# Patient Record
Sex: Male | Born: 1945
Health system: Southern US, Community
[De-identification: ages and names within clinical notes are randomized; demographics above are authoritative.]

## PROBLEM LIST (undated history)

## (undated) DIAGNOSIS — Z86010 Personal history of colonic polyps: Secondary | ICD-10-CM

## (undated) DIAGNOSIS — G4730 Sleep apnea, unspecified: Secondary | ICD-10-CM

## (undated) DIAGNOSIS — Z8042 Family history of malignant neoplasm of prostate: Secondary | ICD-10-CM

## (undated) DIAGNOSIS — G4733 Obstructive sleep apnea (adult) (pediatric): Secondary | ICD-10-CM

## (undated) DIAGNOSIS — Z973 Presence of spectacles and contact lenses: Secondary | ICD-10-CM

## (undated) DIAGNOSIS — D649 Anemia, unspecified: Secondary | ICD-10-CM

## (undated) DIAGNOSIS — Z8719 Personal history of other diseases of the digestive system: Secondary | ICD-10-CM

## (undated) DIAGNOSIS — E785 Hyperlipidemia, unspecified: Secondary | ICD-10-CM

## (undated) DIAGNOSIS — N529 Male erectile dysfunction, unspecified: Secondary | ICD-10-CM

## (undated) DIAGNOSIS — Z862 Personal history of diseases of the blood and blood-forming organs and certain disorders involving the immune mechanism: Secondary | ICD-10-CM

## (undated) DIAGNOSIS — M1990 Unspecified osteoarthritis, unspecified site: Secondary | ICD-10-CM

## (undated) DIAGNOSIS — E291 Testicular hypofunction: Secondary | ICD-10-CM

## (undated) DIAGNOSIS — T7840XA Allergy, unspecified, initial encounter: Secondary | ICD-10-CM

## (undated) DIAGNOSIS — Z8709 Personal history of other diseases of the respiratory system: Secondary | ICD-10-CM

## (undated) DIAGNOSIS — I10 Essential (primary) hypertension: Secondary | ICD-10-CM

## (undated) DIAGNOSIS — Z809 Family history of malignant neoplasm, unspecified: Secondary | ICD-10-CM

## (undated) DIAGNOSIS — C61 Malignant neoplasm of prostate: Secondary | ICD-10-CM

## (undated) DIAGNOSIS — N401 Enlarged prostate with lower urinary tract symptoms: Secondary | ICD-10-CM

## (undated) DIAGNOSIS — Z1379 Encounter for other screening for genetic and chromosomal anomalies: Principal | ICD-10-CM

## (undated) HISTORY — DX: Allergy, unspecified, initial encounter: T78.40XA

## (undated) HISTORY — DX: Family history of malignant neoplasm of prostate: Z80.42

## (undated) HISTORY — DX: Hyperlipidemia, unspecified: E78.5

## (undated) HISTORY — DX: Encounter for other screening for genetic and chromosomal anomalies: Z13.79

## (undated) HISTORY — DX: Male erectile dysfunction, unspecified: N52.9

## (undated) HISTORY — DX: Essential (primary) hypertension: I10

## (undated) HISTORY — PX: PROSTATE BIOPSY: SHX241

## (undated) HISTORY — PX: COLONOSCOPY W/ POLYPECTOMY: SHX1380

## (undated) HISTORY — DX: Family history of malignant neoplasm, unspecified: Z80.9

## (undated) HISTORY — DX: Anemia, unspecified: D64.9

## (undated) HISTORY — PX: TONSILLECTOMY: SUR1361

## (undated) HISTORY — DX: Sleep apnea, unspecified: G47.30

---

## 1998-06-11 ENCOUNTER — Other Ambulatory Visit: Admission: RE | Admit: 1998-06-11 | Discharge: 1998-06-11 | Payer: Self-pay | Admitting: *Deleted

## 1999-07-29 ENCOUNTER — Emergency Department (HOSPITAL_COMMUNITY): Admission: EM | Admit: 1999-07-29 | Discharge: 1999-07-29 | Payer: Self-pay | Admitting: Emergency Medicine

## 2002-05-02 ENCOUNTER — Ambulatory Visit (HOSPITAL_COMMUNITY): Admission: RE | Admit: 2002-05-02 | Discharge: 2002-05-02 | Payer: Self-pay | Admitting: *Deleted

## 2002-05-02 ENCOUNTER — Encounter: Payer: Self-pay | Admitting: *Deleted

## 2005-11-27 DIAGNOSIS — Z860101 Personal history of adenomatous and serrated colon polyps: Secondary | ICD-10-CM

## 2005-11-27 DIAGNOSIS — Z86010 Personal history of colonic polyps: Secondary | ICD-10-CM

## 2005-11-27 HISTORY — DX: Personal history of colonic polyps: Z86.010

## 2005-11-27 HISTORY — DX: Personal history of adenomatous and serrated colon polyps: Z86.0101

## 2006-01-24 ENCOUNTER — Emergency Department (HOSPITAL_COMMUNITY): Admission: EM | Admit: 2006-01-24 | Discharge: 2006-01-24 | Payer: Self-pay | Admitting: Emergency Medicine

## 2006-01-30 ENCOUNTER — Emergency Department (HOSPITAL_COMMUNITY): Admission: EM | Admit: 2006-01-30 | Discharge: 2006-01-30 | Payer: Self-pay | Admitting: Emergency Medicine

## 2006-08-13 ENCOUNTER — Ambulatory Visit: Payer: Self-pay | Admitting: Gastroenterology

## 2006-08-24 ENCOUNTER — Encounter (INDEPENDENT_AMBULATORY_CARE_PROVIDER_SITE_OTHER): Payer: Self-pay | Admitting: *Deleted

## 2006-08-24 ENCOUNTER — Ambulatory Visit: Payer: Self-pay | Admitting: Gastroenterology

## 2006-08-27 DIAGNOSIS — Z8719 Personal history of other diseases of the digestive system: Secondary | ICD-10-CM

## 2006-08-27 HISTORY — DX: Personal history of other diseases of the digestive system: Z87.19

## 2006-09-04 ENCOUNTER — Inpatient Hospital Stay (HOSPITAL_COMMUNITY): Admission: AD | Admit: 2006-09-04 | Discharge: 2006-09-07 | Payer: Self-pay | Admitting: Gastroenterology

## 2006-09-04 ENCOUNTER — Ambulatory Visit: Payer: Self-pay | Admitting: Gastroenterology

## 2006-09-10 ENCOUNTER — Ambulatory Visit: Payer: Self-pay | Admitting: Gastroenterology

## 2007-12-24 ENCOUNTER — Ambulatory Visit: Payer: Self-pay | Admitting: Gastroenterology

## 2007-12-24 LAB — CONVERTED CEMR LAB
Basophils Absolute: 0 10*3/uL (ref 0.0–0.1)
HCT: 44.8 % (ref 39.0–52.0)
Hemoglobin: 14.2 g/dL (ref 13.0–17.0)
Lymphocytes Relative: 27.8 % (ref 12.0–46.0)
MCHC: 31.7 g/dL (ref 30.0–36.0)
MCV: 85.7 fL (ref 78.0–100.0)
Monocytes Absolute: 0.4 10*3/uL (ref 0.2–0.7)
Monocytes Relative: 7.4 % (ref 3.0–11.0)
Neutro Abs: 3.6 10*3/uL (ref 1.4–7.7)
Neutrophils Relative %: 61.7 % (ref 43.0–77.0)
RDW: 14.1 % (ref 11.5–14.6)

## 2007-12-25 ENCOUNTER — Ambulatory Visit: Payer: Self-pay | Admitting: Gastroenterology

## 2008-01-06 ENCOUNTER — Ambulatory Visit: Payer: Self-pay | Admitting: Gastroenterology

## 2008-01-06 DIAGNOSIS — H409 Unspecified glaucoma: Secondary | ICD-10-CM | POA: Insufficient documentation

## 2008-01-06 DIAGNOSIS — D126 Benign neoplasm of colon, unspecified: Secondary | ICD-10-CM | POA: Insufficient documentation

## 2008-01-06 DIAGNOSIS — K648 Other hemorrhoids: Secondary | ICD-10-CM | POA: Insufficient documentation

## 2008-01-06 DIAGNOSIS — IMO0002 Reserved for concepts with insufficient information to code with codable children: Secondary | ICD-10-CM

## 2008-07-28 DIAGNOSIS — Z8709 Personal history of other diseases of the respiratory system: Secondary | ICD-10-CM

## 2008-07-28 HISTORY — DX: Personal history of other diseases of the respiratory system: Z87.09

## 2008-08-09 ENCOUNTER — Ambulatory Visit: Payer: Self-pay | Admitting: Cardiothoracic Surgery

## 2008-08-09 ENCOUNTER — Inpatient Hospital Stay (HOSPITAL_COMMUNITY): Admission: EM | Admit: 2008-08-09 | Discharge: 2008-08-12 | Payer: Self-pay | Admitting: Family Medicine

## 2008-08-27 ENCOUNTER — Ambulatory Visit: Payer: Self-pay | Admitting: Cardiothoracic Surgery

## 2008-08-27 ENCOUNTER — Encounter: Admission: RE | Admit: 2008-08-27 | Discharge: 2008-08-27 | Payer: Self-pay | Admitting: Cardiothoracic Surgery

## 2009-02-02 ENCOUNTER — Emergency Department (HOSPITAL_COMMUNITY): Admission: EM | Admit: 2009-02-02 | Discharge: 2009-02-02 | Payer: Self-pay | Admitting: Emergency Medicine

## 2009-02-10 ENCOUNTER — Emergency Department (HOSPITAL_COMMUNITY): Admission: EM | Admit: 2009-02-10 | Discharge: 2009-02-10 | Payer: Self-pay | Admitting: Emergency Medicine

## 2009-02-10 ENCOUNTER — Telehealth: Payer: Self-pay | Admitting: Gastroenterology

## 2009-02-11 ENCOUNTER — Ambulatory Visit: Payer: Self-pay | Admitting: Gastroenterology

## 2009-02-11 ENCOUNTER — Inpatient Hospital Stay (HOSPITAL_COMMUNITY): Admission: EM | Admit: 2009-02-11 | Discharge: 2009-02-13 | Payer: Self-pay | Admitting: Emergency Medicine

## 2009-02-12 ENCOUNTER — Encounter: Payer: Self-pay | Admitting: Gastroenterology

## 2009-02-15 ENCOUNTER — Encounter: Payer: Self-pay | Admitting: Gastroenterology

## 2009-02-18 ENCOUNTER — Encounter: Payer: Self-pay | Admitting: Nurse Practitioner

## 2009-02-22 ENCOUNTER — Telehealth: Payer: Self-pay | Admitting: Gastroenterology

## 2009-02-25 ENCOUNTER — Ambulatory Visit: Payer: Self-pay | Admitting: Gastroenterology

## 2009-02-25 DIAGNOSIS — K922 Gastrointestinal hemorrhage, unspecified: Secondary | ICD-10-CM | POA: Insufficient documentation

## 2010-11-27 HISTORY — PX: UMBILICAL HERNIA REPAIR: SHX2598

## 2011-03-09 LAB — CBC
HCT: 21.5 % — ABNORMAL LOW (ref 39.0–52.0)
HCT: 36.3 % — ABNORMAL LOW (ref 39.0–52.0)
HCT: 38.8 % — ABNORMAL LOW (ref 39.0–52.0)
Hemoglobin: 12.3 g/dL — ABNORMAL LOW (ref 13.0–17.0)
Hemoglobin: 7.4 g/dL — CL (ref 13.0–17.0)
Hemoglobin: 8.2 g/dL — ABNORMAL LOW (ref 13.0–17.0)
Hemoglobin: 8.4 g/dL — ABNORMAL LOW (ref 13.0–17.0)
MCHC: 33.4 g/dL (ref 30.0–36.0)
MCHC: 33.8 g/dL (ref 30.0–36.0)
MCHC: 34.2 g/dL (ref 30.0–36.0)
MCHC: 34.5 g/dL (ref 30.0–36.0)
MCV: 84 fL (ref 78.0–100.0)
MCV: 84.6 fL (ref 78.0–100.0)
MCV: 85.1 fL (ref 78.0–100.0)
MCV: 86 fL (ref 78.0–100.0)
Platelets: 123 10*3/uL — ABNORMAL LOW (ref 150–400)
Platelets: 133 10*3/uL — ABNORMAL LOW (ref 150–400)
Platelets: 141 10*3/uL — ABNORMAL LOW (ref 150–400)
Platelets: 173 10*3/uL (ref 150–400)
RBC: 2.83 MIL/uL — ABNORMAL LOW (ref 4.22–5.81)
RBC: 3.1 MIL/uL — ABNORMAL LOW (ref 4.22–5.81)
RBC: 3.15 MIL/uL — ABNORMAL LOW (ref 4.22–5.81)
RBC: 4.27 MIL/uL (ref 4.22–5.81)
RDW: 15.2 % (ref 11.5–15.5)
RDW: 15.3 % (ref 11.5–15.5)
RDW: 15.3 % (ref 11.5–15.5)
RDW: 15.7 % — ABNORMAL HIGH (ref 11.5–15.5)
WBC: 6.9 10*3/uL (ref 4.0–10.5)
WBC: 7.2 10*3/uL (ref 4.0–10.5)
WBC: 7.4 10*3/uL (ref 4.0–10.5)
WBC: 8.4 10*3/uL (ref 4.0–10.5)

## 2011-03-09 LAB — DIFFERENTIAL
Basophils Absolute: 0 K/uL (ref 0.0–0.1)
Basophils Relative: 0 % (ref 0–1)
Eosinophils Absolute: 0.1 K/uL (ref 0.0–0.7)
Eosinophils Relative: 2 % (ref 0–5)
Lymphocytes Relative: 21 % (ref 12–46)
Lymphocytes Relative: 28 % (ref 12–46)
Lymphs Abs: 1.6 K/uL (ref 0.7–4.0)
Monocytes Absolute: 0.4 10*3/uL (ref 0.1–1.0)
Monocytes Absolute: 0.4 K/uL (ref 0.1–1.0)
Monocytes Relative: 5 % (ref 3–12)
Monocytes Relative: 7 % (ref 3–12)
Neutro Abs: 3.7 10*3/uL (ref 1.7–7.7)
Neutro Abs: 5.3 K/uL (ref 1.7–7.7)
Neutrophils Relative %: 71 % (ref 43–77)

## 2011-03-09 LAB — TYPE AND SCREEN
ABO/RH(D): A POS
Antibody Screen: NEGATIVE

## 2011-03-09 LAB — COMPREHENSIVE METABOLIC PANEL WITH GFR
ALT: 56 U/L — ABNORMAL HIGH (ref 0–53)
AST: 36 U/L (ref 0–37)
Albumin: 3.7 g/dL (ref 3.5–5.2)
Alkaline Phosphatase: 65 U/L (ref 39–117)
BUN: 17 mg/dL (ref 6–23)
CO2: 26 meq/L (ref 19–32)
Calcium: 9.1 mg/dL (ref 8.4–10.5)
Chloride: 101 meq/L (ref 96–112)
Creatinine, Ser: 1.12 mg/dL (ref 0.4–1.5)
GFR calc Af Amer: >60 mL/min (ref >60)
GFR calc non Af Amer: >60 mL/min (ref >60)
Glucose, Bld: 130 mg/dL — ABNORMAL HIGH (ref 70–99)
Potassium: 3.9 meq/L (ref 3.5–5.1)
Sodium: 133 meq/L — ABNORMAL LOW (ref 135–145)
Total Bilirubin: 0.8 mg/dL (ref 0.3–1.2)
Total Protein: 6.7 g/dL (ref 6.0–8.3)

## 2011-03-09 LAB — HEMOGLOBIN AND HEMATOCRIT, BLOOD: Hemoglobin: 9.8 g/dL — ABNORMAL LOW (ref 13.0–17.0)

## 2011-03-09 LAB — COMPREHENSIVE METABOLIC PANEL
AST: 35 U/L (ref 0–37)
Albumin: 3.8 g/dL (ref 3.5–5.2)
BUN: 17 mg/dL (ref 6–23)
Creatinine, Ser: 1.16 mg/dL (ref 0.4–1.5)
GFR calc Af Amer: >60 mL/min (ref >60)
Potassium: 4.1 mEq/L (ref 3.5–5.1)
Total Protein: 7.1 g/dL (ref 6.0–8.3)

## 2011-03-09 LAB — BASIC METABOLIC PANEL
CO2: 26 mEq/L (ref 19–32)
Chloride: 111 mEq/L (ref 96–112)
Glucose, Bld: 96 mg/dL (ref 70–99)
Potassium: 3.6 mEq/L (ref 3.5–5.1)
Sodium: 142 mEq/L (ref 135–145)

## 2011-03-09 LAB — CARDIAC PANEL(CRET KIN+CKTOT+MB+TROPI)
CK, MB: 1.3 ng/mL (ref 0.3–4.0)
Relative Index: INVALID (ref 0.0–2.5)
Total CK: 77 U/L (ref 7–232)
Troponin I: <0.01 ng/mL (ref 0.00–0.06)

## 2011-03-09 LAB — PROTIME-INR
INR: 1 (ref 0.00–1.49)
INR: 1.1 (ref 0.00–1.49)
Prothrombin Time: 13.3 s (ref 11.6–15.2)
Prothrombin Time: 15 seconds (ref 11.6–15.2)

## 2011-03-09 LAB — APTT
aPTT: 24 s (ref 24–37)
aPTT: 26 seconds (ref 24–37)

## 2011-04-11 NOTE — Discharge Summary (Signed)
[name], [name] NO.:  [id number]   MEDICAL RECORD NO.:  [id number]          PATIENT TYPE:  INP   LOCATION:  2011                         FACILITY:  [facility]   PHYSICIAN:  Sheliah Plane, MD    DATE OF BIRTH:  Apr 23, 1946   DATE OF ADMISSION:  08/09/2008  DATE OF DISCHARGE:                               DISCHARGE SUMMARY   ADDENDUM   This was continuation of the previous dictation that was cut off.  Today's x-ray showed no pneumothorax, atelectasis at the bases.   PHYSICAL EXAMINATION:  CARDIOVASCULAR:  Normal sinus rhythm.  LUNGS:  Clear to auscultation bilaterally.  The patient's chest tube was  removed.  Followup chest x-ray revealed no pneumothorax.  Slight  decrease in lung volumes with elevation of left hemidiaphragm.  Chest x-  ray remained stable.  The patient will be discharged on August 12, 2008.   Laboratory studies done revealed the H and H to be 14.9 and 45.2  respectively, white count of 6100, platelet count of 206,000.   Discharge instructions include the following:  The patient may shower.  He is to cleanse his wound with soap and water and then may let open to  air.  He is not to lift or drive until after being seen by the surgeon.  He is to continue with deep breathing exercises daily and to walk daily.  He is to come to see Dr. Tyrone Sage on August 27, 2008.  Prior to this  office appointment, a chest x-ray will be obtained.  Chest tube suture  will be removed at his office appointment with Dr. Tyrone Sage.   Discharge medications include the following:  ECASA 81 mg p.o. daily,  Lipitor 20 mg p.o. daily, Flomax 0.4 mg p.o. daily,  lisinopril/hydrochlorothiazide 20/12.5 mg p.o. daily, and oxycodone IR 5  mg one to two tablets p.o. q.4-6 h. as needed for pain.      Doree Fudge, [place]      Sheliah Plane, MD  Electronically Signed    DZ/MEDQ  D:  08/11/2008  T:  08/12/2008  Job:  [id number]   cc:   [name], M.D.

## 2011-04-11 NOTE — Assessment & Plan Note (Signed)
[facility]                         GASTROENTEROLOGY OFFICE NOTE   NAME:Jeane, Garrett CAIN                       MRN:          [id number]  DATE:01/06/2008                            DOB:          07-Jun-1946    GI PROBLEM LIST:  1. History of tubular adenomas.  2. Colonoscopy by Dr. Victorino Dike, September 2007 showed a 7 mm      sessile polyp removed by hot biopsy, and this was found to be a      tubular adenoma.  This was complicated by post polypectomy bleeding      that was treated by Dr. Barnet Pall with repeat colonoscopy,      injection and Endoclip placement of the site in his cecum.  3. History of internal hemorrhoids.   INTERVAL HISTORY:  I last saw [name] two weeks ago.  He had had some  minor rectal bleeding, some anal discomfort.  A CBC just prior to his  visit showed that he was not anemic, specifically his hemoglobin was  14.2.  Examination revealed some fullness in his anus consistent with  swollen internal hemorrhoids.  Since then he has been taking Analpram  twice daily, and has noticed no problems with rectal bleeding or anal  discomfort.   CURRENT MEDICATIONS:  Aspirin, Flomax, lisinopril, AndroGel, Lanacane,  coconut oil, Allegra, multivitamin and Analpram twice daily.   PHYSICAL EXAMINATION:  Weight:  224 pounds, which is up 3 pounds since  his last visit.  Blood pressure 122/82, pulse 82.  CONSTITUTIONAL:  Generally well appearing.  ABDOMEN:  Soft, nontender, nondistended.  Normal bowel sounds.   ASSESSMENT/PLAN:  65 year old man with resolved minor anorectal  bleeding.   His bleeding was clinically consistent with hemorrhoidal bleeding.  I  did palpate some soft, swollen internal hemorrhoids on examination two  weeks ago.  His bleeding has resolved.  Given the fact that he has had  two colonoscopies back in the fall of 2007, I see no reason for any  further invasive testing.  He will wean himself off the Analpram  to once  daily for the next 10 days, and then come off completely.  He knows to  give a call if he has any further troubles with bleeding.     Rachael Fee, MD  Electronically Signed    DPJ/MedQ  DD: 01/06/2008  DT: 01/06/2008  Job #: [id number]   cc:   [name], M.D.

## 2011-04-11 NOTE — Consult Note (Signed)
NAMEKEENON, LEITZEL NO.:  [id number]   MEDICAL RECORD NO.:  [id number]         PATIENT TYPE:  CINP   LOCATION:                               FACILITY:  [facility]   PHYSICIAN:  Sheliah Plane, MD    DATE OF BIRTH:  10-13-1946   DATE OF CONSULTATION:  DATE OF DISCHARGE:                                 CONSULTATION   REQUESTING PHYSICIAN:  [facility].   REASON FOR ADMISSION:  Right spontaneous pneumothorax.   HISTORY OF PRESENT ILLNESS:  The patient is a 65 year old male who noted  several days of increasing chest congestion and vague discomfort in his  right chest.  After working in his yard today and then going to a play  at [employer], he mentioned to his wife about the discomfort and went  [facility] where chest x-ray was performed at 6:30.  He  was transferred to the [facility] Emergency Room with diagnosis of spontaneous  pneumothorax.  I was beeped at 9:14 to see the patient.  He is really in  no distress, but chest x-ray reveals at least 60% pneumothorax.   PAST MEDICAL HISTORY:  Significant for hyperlipidemia and hypertension.   PAST SURGICAL HISTORY:  Includes polyp removal by colonoscopy.   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   FAMILY HISTORY:  Father died of a metastatic cancer of unknown origin.  The patient's mother died of a cardiac disease.   CURRENT MEDICATIONS:  1. Lisinopril 20 mg a day.  2. Lisinopril/HCTZ 20/12.5 p.o. daily.  3. Lipitor 20 mg a day.  4. Flomax 0.4 mg a day.   REVIEW OF SYSTEMS:  The patient denies fever, chills, or night sweats.  Denies chest pain other than the last 2 days as noted above.  He has no  cough and no hemoptysis.  No history of tuberculosis.  No change in  bowel habits.  No blood in his stool or urine.  He does have mild  nocturnal dyspnea, treated with Flomax.   Denies claudication.  No  amaurosis or TIAs.   On physical exam, the patient's blood pressure is 158/103, pulse is 77,  respiratory rate 15, and O2 sats 100% on room air.  He has no carotid  bruits.  He is awake, alert, and neurologically intact.  Breath sounds  are markedly decreased over the right chest, clear over the left.  Abdominal exam without palpable tenderness or palpable masses.  He has  no tenderness in his lower extremities.  DP and PT pulses +1  bilaterally.   IMPRESSION:  The patient with right spontaneous pneumothorax, nonsmoker  with no history of asthma.  A right chest tube placement is recommended.  The patient agrees and signed informed consent.   DESCRIPTION OF THE PROCEDURE:  Under light intravenous sedation with O2  sat monitoring and EKG monitoring, the right chest was prepped with  Betadine. Lidocaine 1% was infiltrated locally.  A 28-French chest tube  was placed into  the right chest without difficulty with significant  amount of air return.  Followup chest x-ray is pending.      Sheliah Plane, MD  Electronically Signed     EG/MEDQ  D:  08/09/2008  T:  08/10/2008  Job:  [id number]   cc:   [name], M.D.

## 2011-04-11 NOTE — Assessment & Plan Note (Signed)
[facility]                         GASTROENTEROLOGY OFFICE NOTE   NAME:[name], [name]                       MRN:          [id number]  DATE:12/25/2007                            DOB:          Oct 21, 1946    PRIMARY CARE PHYSICIAN:  [name], M.D.   GI PROBLEM LIST:  1. History of tubular adenomas.  2. Colonoscopy by Dr. Victorino Dike, September 2007 showed a 7 mm      sessile polyp removed by hot biopsy, and this was found to be a      tubular adenoma.  This was complicated by post polypectomy bleeding      that was treated by Dr. Barnet Pall with repeat colonoscopy,      injection and Endoclip placement of the site in his cecum.  3. History of internal hemorrhoids.   INTERVAL HISTORY:  I last saw [name] about a year and a half ago.  He had been doing well, having no problems with constipation or bleeding  until the past month or two.  He noticed some discomfort consistent with  external hemorrhoids.  He began using Preparation H White with good  relief of his itching and his discomfort.  The last week or so he had  intermittent rectal bleeding which has been somewhat consistent with  bloody diarrhea on one occasion; but on two other occasions, it was more  dripping of blood from his anus.   CURRENT MEDICATIONS:  Aspirin, Flomax, lisinopril, AndroGel, __________  , coconut oil.   PHYSICAL EXAMINATION:  VITAL SIGNS:  Weight is 221 pounds which is up 9  pounds since his last visit.  Blood pressure is 130/72, pulse 72.  CONSTITUTIONAL:  Generally well-appearing.  ABDOMEN:  Soft, nontender, nondistended, normal bowel sounds.  RECTAL EXAMINATION:  No obvious external hemorrhoids, although there was  some fullness in his anus consistent with some swollen internal  hemorrhoids.  There is no blood, no masses palpated.   ASSESSMENT/PLAN:  A 65 year old man with likely internal hemorrhoid  bleeding.   I did not mention it above, but he had a CBC  done yesterday showing he  is not anemic.  His hemoglobin is 14.2.  He has not started any new  blood thinners or any platelet agents.  I gave him samples of Analpram  to be inserted anally twice a day for the next two weeks or so.  He  already has an appointment set up with me in 10 days to two weeks time,  and he knows to keep  that appointment if his bleeding continues.  Past that, I would like to  perform likely a flexible sigmoidoscopy to make sure we are not missing  anything more serious.     Rachael Fee, MD  Electronically Signed    DPJ/MedQ  DD: 12/25/2007  DT: 12/25/2007  Job #: [id number]   cc:   [name], M.D.

## 2011-04-11 NOTE — H&P (Signed)
[name], DISCHER NO.:  [id number]   MEDICAL RECORD NO.:  [id number]          PATIENT TYPE:  INP   LOCATION:  2607                         FACILITY:  [facility]   PHYSICIAN:  Michiel Cowboy, MDDATE OF BIRTH:  1946/09/20   DATE OF ADMISSION:  02/11/2009  DATE OF DISCHARGE:                              HISTORY & PHYSICAL   PRIMARY CARE Eh Sesay:  Dr. Azucena Cecil.   CHIEF COMPLAINT:  Bright red blood per rectum.   The patient is a 65 year old gentleman with past medical history  significant for hypertension, hemorrhoids, and history of spontaneous  pneumothorax in 2009.  The patient was at his baseline of health up  until a few days ago when he became more constipated.  Yesterday he  passed fairly large and hard stool, but did not endorse any pain or  tearing sensation with that stool.  Today in the morning around 11:30 he  felt like he had gas, but when he went to the bathroom it was actually  blood.  He cannot really tell quantity of how much blood it was, but  does report some clots in it.  Since then he had about 3-4 or so bloody  bowel movements into the toilet.  He presented initially to the  emergency department initially around 4 p.m., and hemoglobin was 13.2  and he appeared to be stable and was sent home, but came back at 8:30  with worsening sensation of presyncope when he stands up.  He was  orthostatic, and his hemoglobin went down to 12.3 at which point [facility]  hospitalist was called for admission.  The patient started to receive IV  fluids, but still remained somewhat orthostatic.  He denies any  shortness of breath or chest pain, no nausea, no vomiting.  He does  endorse slight cramping in the abdomen, but, otherwise, unremarkable.   SOCIAL HISTORY:  The patient does not smoke, but does chew tobacco.  He  drinks only on the weekends, about a case of beer per entire weekend,  but does not drink during regular workdays.   FAMILY HISTORY:   Noncontributory.   ALLERGIES:  PENICILLIN.   MEDICATIONS:  1. Lisinopril.  He does not know his dosing.  2. __________ mg daily.  3. Lipitor 10 mg daily.  4. Aspirin 81 mg daily.  5. Allegra.  6. Lumigan eye drops.   VITALS:  Temperature 98.6, blood pressure initially 123/78, but when  stands up down to 97/72, pulse 96, respirations 20, saturating 99% on  room air.  The patient appears to be in no acute distress.  HEAD:  Nontraumatic.  Moist mucous membranes.  LUNGS:  Clear to auscultation bilaterally.  HEART:  Regular rate and rhythm.  No murmurs, rubs or gallops.  ABDOMEN:  Soft.  There is a very mild left lower quadrant discomfort,  but, otherwise, unremarkable.  RECTAL:  Per ED, positive for frank bright red blood in rectum.  LOWER EXTREMITIES:  No clubbing, cyanosis or edema.  NEUROLOGICAL:  Appears to be intact.  Strength 5/5 in all 4 extremities.  SKIN:  Clean, dry, and  intact.   LABORATORIES:  White blood cell count 7.5, hemoglobin 12.3.  Sodium 133,  potassium 3.9, creatinine 1.12.  AST 36, ALT slightly elevated at 56,  but the rest of LFTs unremarkable.  Hemoccult positive.  No ABG was  obtained.   EKG:  Sinus rhythm, heart rate 88, no ischemic changes noted with early  repolarization in lead V2 which is same as from EKG of August 08, 2008.   ASSESSMENT AND PLAN:  1. This is a 65 year old gentleman with history of hemorrhoids who now      presents with bright red blood per rectum after some hard stools.      The differential diagnosis could include a hemorrhoidal bleed      versus a fissure, though is less likely given no history of      significant pain with defecation versus diverticular bleed.  Will      admit given orthostasis and patient being symptomatic will watch in      step-down.  I only can recommend gastrointestinal consult in a.m.,      and will call sooner if patient decompensates.  Will give      intravenous fluids, although I doubt upper  source because patient      has not had any hematemesis or epigastric pain, and will,      nonetheless, give Protonix intravenous b.i.d.  Will make patient      n.p.o.  The patient will likely benefit from either flex sig or      full colonoscopy tomorrow.  Will hold blood pressure medications      and hold aspirin.  2. History of hypertension currently somewhat low blood pressures.      Will hold lisinopril.  3. History of hyperlipidemia.  Once patient able to take PO, will      restart Lipitor.  4. Prophylaxis Protonix,  prophylaxis sequential compression devices.  5. Will do serial CBCs.  Right now there is no indication for      transfusion, but if hemoglobin starts to fall will write for a      transfusion.      Michiel Cowboy, MD  Electronically Signed     AVD/MEDQ  D:  02/11/2009  T:  02/11/2009  Job:  [id number]   cc:   [name], M.D.

## 2011-04-11 NOTE — Discharge Summary (Signed)
NAMEBRITNEY, CAPTAIN NO.:  [id number]   MEDICAL RECORD NO.:  [id number]          PATIENT TYPE:  INP   LOCATION:  2011                         FACILITY:  [facility]   PHYSICIAN:  Sheliah Plane, MD    DATE OF BIRTH:  Mar 14, 1946   DATE OF ADMISSION:  08/09/2008  DATE OF DISCHARGE:  08/12/2008                               DISCHARGE SUMMARY   ADMITTING DIAGNOSES:  1. Right spontaneous pneumothorax.  2. History of hypertension.  3. History of hyperlipidemia.   DISCHARGE DIAGNOSES:  1. Right spontaneous pneumothorax.  2. History of hypertension.  3. History of hyperlipidemia.   PROCEDURE:  Placement of a right chest tube on August 09, 2008, by  Dr. Tyrone Sage.   HISTORY OF PRESENTING ILLNESS:  This is a 65 year old African American  male with a prior medical history of hypertension and hyperlipidemia,  who began experiencing increasing chest congestion and vague discomfort  in his right chest over the last several days.  [facility], [facility], where a chest x-ray was performed  around 6:30 in the evening.  He was found to have a right spontaneous  pneumothorax.  He was then transferred to [facility] Room for  further evaluation and treatment.  Chest x-ray done showed a large right-  sided pneumothorax, but is approximately 60% left lung clear and well  aerated.  The patient underwent a right chest tube placement.   BRIEF HOSPITAL COURSE STAY:  The patient had no air leak from his chest  tube.  The following morning, he remained afebrile and vital signs  stable.  O2 sat was 100% on 2 liters nasal cannula.  Chest x-ray done on  this date showed subsegmental atelectasis in the lower lung zone, but no  pneumothorax.  Chest tube was placed on waterseal.  Currently, again the  patient is afebrile and vital signs are stable.  O2 sat is 90% on room  air.  CT has minimal drainage, no air leak.  Chest x-ray done   Dictation  ended at this point.      Doree Fudge, [place]      Sheliah Plane, MD  Electronically Signed   DZ/MEDQ  D:  08/11/2008  T:  08/12/2008  Job:  [id number]

## 2011-04-11 NOTE — Assessment & Plan Note (Signed)
OFFICE VISIT   LEVY, CEDANO  DOB:  07/19/1946                                        August 27, 2008  CHART #:  [id number]   The patient returns to the office today in followup after spontaneous  right pneumothorax and placement of a right chest tube.  Although the  patient has no previous history of smoking, he does use oral tobacco and  has for many years since his chest tube was removed.  The patient has  done well.  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  He has had no recurrent  symptoms.  He has had no shortness of breath.  No hemoptysis.  Overall,  he has felt well.   PHYSICAL EXAMINATION:  VITAL SIGNS:  His blood pressure is 129/84, pulse  is 75, respiratory rate is 18, and O2 sats 98%.  LUNGS:  Clear bilaterally.  The right chest tube sutures were removed.  The chest tube site is healed.  ABDOMEN:  Benign without palpable masses.  Aorta is not palpably  enlarged.   Followup chest x-ray shows clear lung fields bilaterally without  evidence of recurrent pneumothorax.   I have discussed with the patient the slight, but new risk of recurrent  pneumothorax and somebody who has had a previous one were also talked  about his use of oral tobacco and was strongly discouraged to stop.  I  also encouraged him to seek regular dental care.  He notes that he has  not been to the dentist for many years.  He was encouraged to seek  regular dental care for surveillance of possible oral cancers.  The  patient notes that he is now covered with dental insurance with his  wife's policy.  He is to see Dr. Azucena Cecil in followup for his annual  physical in several weeks.   I have not made him a return appointment, but have reviewed with him the  signs and symptoms of pneumothorax and encouraged him to seek prompt  medical attention should he have any recurrent symptoms.   Sheliah Plane, MD  Electronically  Signed   EG/MEDQ  D:  08/27/2008  T:  08/28/2008  Job:  [id number]   cc:   [name], M.D.

## 2011-04-14 NOTE — Discharge Summary (Signed)
NAMEAVRAJ, LINDROTH NO.:  [id number]   MEDICAL RECORD NO.:  [id number]          PATIENT TYPE:   LOCATION:                                 FACILITY:   PHYSICIAN:  Barbette Hair. Arlyce Dice, MD,FACGDATE OF BIRTH:  1945/12/19   DATE OF ADMISSION:  09/04/2006  DATE OF DISCHARGE:  09/07/2006                               DISCHARGE SUMMARY   The sonogram and please see dictated discharge summary for the patient  record number.   REASON FOR ADMISSION:  1. Lower GI, post polypectomy in etiology.  The patient required      colonoscopic intervention to the site of the polypectomy.  2. Tubular adenoma on colonoscopy August 24, 2006, by Dr. Victorino Dike.  3. Hypertension.  4. Hypercholesterolemia.  5. Tonsillectomy.  6. Glaucoma.  7. Allergy to PENICILLIN.  8. Chronic tobacco chewer.   DISCHARGE DIAGNOSES:  1. Lower gastrointestinal bleed, presumed post polypectomy source.      Bleeding resolved without any intervention.  2. Acute blood loss anemia.  Transfused with 2 units packed red blood      cells during this admission.  Hemoglobin nadir of 7.9; it was 11.3      at discharge.  3. Status post colonoscopy on September 05, 2006, by Dr. Melvia Heaps.      A large clot and active bleeding seen at the site of the      polypectomy.  This was treated with epinephrine injection and      application of 2 Endoclips after which hemostasis was obtained.      Also noted at colonoscopy was diffuse diverticulosis.   BRIEF HISTORY:  Mr. Nicholos Johns is a pleasant 65 year old gentleman who had  undergone a screening colonoscopy August 24, 2006, by Dr. Victorino Dike.  A 7-mm sessile polyp was removed by hot biopsy.  The pathology  turned out to be a tubular adenoma.  The patient had been doing well  since then, but about 5 days after the procedure he started seeing some  bloody looking stools.  Initially this happened once a day in the  morning.  However, on  October 9 he had two  bloody bowel movements and  was seen at the gastroenterology office by Dr. Rob Bunting.  He was  not having any associated shortness of breath, chest pain, dizziness,  nausea, or abdominal pain.  He actually felt fine; he was just having  the bloody bowel movements.  Dr. Christella Hartigan admitted him for what was  presumed to be a post polypectomy bleed.   HOSPITAL COURSE:  #1.  LOWER GASTROINTESTINAL  BLEED:  The patient underwent a  colonoscopy.  As described above, the site of the polypectomy was indeed  found to be the source for the bleeding.  This was treated.  Following  the colonoscopy with endoscopic treatment, the patient's bleeding did  not recur.  The patient was discharged on October 12 in stable  condition.   #2.  ACUTE BLOOD LOSS ANEMIA:  The patient's initial hemoglobin on  admission was 13.2.  It dropped to  7.9.  He received 2 units of packed  red blood cells, and the hemoglobin was 11.3 on the day of discharge.   DISCHARGE DIET:  Regular.   CONDITION ON DISCHARGE:  Stable, improved.   FOLLOWUP PLANS:  With Dr. Victorino Dike in 2-3 weeks.   DISCHARGE MEDICATIONS:  1. Feosol one p.o. twice daily.  2. Aspirin 81 mg once daily.  3. Flomax 0.4 mg once daily.  4. Lipitor 10 mg once daily.  5. AndroGel 5 grams p.o. once daily.  6. Lumigan eye drops 1 drop to each eye daily at bedtime.  7. Lisinopril, dose unknown, once daily.  8. Vitamin E once daily.  9. Selenium supplement once daily.      Jennye Moccasin, PA-C      Barbette Hair. Arlyce Dice, MD,FACG  Electronically Signed    SG/MEDQ  D:  10/28/2007  T:  10/28/2007  Job:  [id number]

## 2011-04-14 NOTE — Assessment & Plan Note (Signed)
HEALTHCARE                           GASTROENTEROLOGY OFFICE NOTE   NAME:[name]                       MRN:          [id number]  DATE:09/04/2006                            DOB:          1946-02-14    PRIMARY CARE PHYSICIANS:  1. Primary Gastroenterologist:  Ulyess Mort, M.D.  2. Primary care physician:  [name], M.D.   GI PROBLEM LIST:  History of tubular adenomas.  Colonoscopy August 24, 2006 by Dr. Victorino Dike, 7 mm sessile polyp removed by hot biopsy.  This was  found to be a tubular adenoma on biopsy report.   INTERVAL HISTORY:  Mr. [name] was last seen here at the time of his  colonoscopy August 24, 2006.  He did fine afterwards and had no problems  until about four to five days later when he started noticing some dark  bloody-looking stools.  This would happen just once a day or so but this  morning, he had two frankly bloody bowel movements, one at 6:30 and one at a  little after 7:00  He does feel the urge to move his bowels again this  morning at 9:30 when I am seeing him in the office.  He has had no  lightheadedness, no chest pain, no shortness of breath.  When he stands he  feels fine.   PAST MEDICAL HISTORY:  1. Hypertension.  2. Elevated cholesterol.  3. Tonsillectomy.  4. Glaucoma.   CURRENT MEDICATIONS:  1. Aspirin 81 mg (he began taking this aspirin the day after his      colonoscopy).  2. Flomax.  3. Lisinopril.  4. Selenium.  5. Vitamin E.  6. Lipitor.  7. AndroGel.  8. Lumigan eye drops.   ALLERGIES:  PENICILLIN.   SOCIAL HISTORY:  Chews tobacco, nonsmoker. He does drink alcohol  periodically.   FAMILY HISTORY:  Father with colon cancer.   PHYSICAL EXAMINATION:  VITAL SIGNS:  6 feet, 2 inches, 212 pounds.  Blood  pressure 118/76. Pulse 68.  CONSTITUTIONAL:  Generally well-appearing.  NEUROLOGICAL:  Alert and oriented x3.  HEENT:  Eyes with extraocular movements intact.  Mouth with  oropharynx  moist, no lesions.  NECK:  Supple, no lymphadenopathy.  CARDIOVASCULAR:  Heart has regular rate and rhythm.  LUNGS:  Clear to auscultation bilaterally.  ABDOMEN:  Soft, nontender, nondistended.  Normal bowel sounds.  EXTREMITIES:  No lower extremity edema.  SKIN:  No rashes or lesions on visible extremities.  RECTAL:  Positive for gross red blood in rectal vault.   ASSESSMENT/PLAN:  Patient is a 65 year old man with likely post polypectomy  bleed from cecal polyp removed by hot biopsy.   He does not look at all unstable now, but it does seem like the bleeding has  picked up this morning with two bright red blood bowel movements and he has  the urge to go again.  Arranging for him to be directly admitted to the  hospital, getting STAT CBC, coag's, CMET.  He will have another set later  tonight.  Also begin prepping him with a gallon of GoLYTELY  beginning at  4:00 P.M.  The plan will be that if he continues to bleed throughout the  prep and throughout today then he will have a full colonoscopy by my  partner, Dr. Melvia Heaps tomorrow.  The goal will be to look for the site  of bleeding, probably the post polypectomy site of his cecum.  However, if  he clears throughout the prep course and throughout today there is probably  no reason to proceed with colonoscopy again.  I have discussed the case with  our Physician Assistant, Amy Esterwood.  I have written orders for him to be  directly admitted to the hospital.            ______________________________  Rachael Fee, MD      DPJ/MedQ  DD:  09/04/2006  DT:  09/04/2006  Job #:  [id number]   cc:   Ulyess Mort, MD  [name], M.D.  Iva Boop, MD,FACG

## 2011-08-30 LAB — DIFFERENTIAL
Basophils Relative: 1
Lymphs Abs: 1.8
Monocytes Relative: 8
Neutro Abs: 3.6
Neutrophils Relative %: 58

## 2011-08-30 LAB — POCT I-STAT, CHEM 8
Chloride: 104
Glucose, Bld: 96
HCT: 48
Hemoglobin: 16.3
Potassium: 3.7
Sodium: 136

## 2011-08-30 LAB — CBC
MCHC: 32.8
Platelets: 206
RBC: 5.32
WBC: 6.1

## 2011-12-27 ENCOUNTER — Inpatient Hospital Stay (HOSPITAL_COMMUNITY)
Admission: EM | Admit: 2011-12-27 | Discharge: 2011-12-31 | DRG: 174 | Disposition: A | Discharge status: Home / Self Care | Payer: BC Managed Care – PPO | Attending: Internal Medicine | Admitting: Internal Medicine

## 2011-12-27 ENCOUNTER — Encounter (HOSPITAL_COMMUNITY): Payer: Self-pay | Admitting: Emergency Medicine

## 2011-12-27 DIAGNOSIS — K5731 Diverticulosis of large intestine without perforation or abscess with bleeding: Principal | ICD-10-CM | POA: Diagnosis present

## 2011-12-27 DIAGNOSIS — I959 Hypotension, unspecified: Secondary | ICD-10-CM

## 2011-12-27 DIAGNOSIS — K922 Gastrointestinal hemorrhage, unspecified: Secondary | ICD-10-CM

## 2011-12-27 DIAGNOSIS — Z860100 Personal history of colon polyps, unspecified: Secondary | ICD-10-CM

## 2011-12-27 DIAGNOSIS — Z880 Allergy status to penicillin: Secondary | ICD-10-CM

## 2011-12-27 DIAGNOSIS — I10 Essential (primary) hypertension: Secondary | ICD-10-CM | POA: Diagnosis present

## 2011-12-27 DIAGNOSIS — D62 Acute posthemorrhagic anemia: Secondary | ICD-10-CM | POA: Diagnosis present

## 2011-12-27 DIAGNOSIS — IMO0002 Reserved for concepts with insufficient information to code with codable children: Secondary | ICD-10-CM | POA: Diagnosis present

## 2011-12-27 DIAGNOSIS — N400 Enlarged prostate without lower urinary tract symptoms: Secondary | ICD-10-CM | POA: Diagnosis present

## 2011-12-27 HISTORY — DX: Personal history of colonic polyps: Z86.010

## 2011-12-27 LAB — DIFFERENTIAL
Lymphocytes Relative: 39 % (ref 12–46)
Lymphs Abs: 2.4 10*3/uL (ref 0.7–4.0)
Monocytes Absolute: 0.3 10*3/uL (ref 0.1–1.0)
Monocytes Relative: 5 % (ref 3–12)
Neutro Abs: 3.4 10*3/uL (ref 1.7–7.7)
Neutrophils Relative %: 54 % (ref 43–77)

## 2011-12-27 LAB — COMPREHENSIVE METABOLIC PANEL
Albumin: 3.8 g/dL (ref 3.5–5.2)
Alkaline Phosphatase: 64 U/L (ref 39–117)
BUN: 15 mg/dL (ref 6–23)
CO2: 27 mEq/L (ref 19–32)
Chloride: 103 mEq/L (ref 96–112)
Creatinine, Ser: 1.08 mg/dL (ref 0.50–1.35)
GFR calc non Af Amer: 71 mL/min — ABNORMAL LOW (ref >90)
Glucose, Bld: 173 mg/dL — ABNORMAL HIGH (ref 70–99)
Potassium: 3.5 mEq/L (ref 3.5–5.1)
Total Bilirubin: 0.2 mg/dL — ABNORMAL LOW (ref 0.3–1.2)

## 2011-12-27 LAB — POCT I-STAT, CHEM 8
BUN: 16 mg/dL (ref 6–23)
Chloride: 108 mEq/L (ref 96–112)
Creatinine, Ser: 1.2 mg/dL (ref 0.50–1.35)
Potassium: 3.4 mEq/L — ABNORMAL LOW (ref 3.5–5.1)
Sodium: 143 mEq/L (ref 135–145)
TCO2: 24 mmol/L (ref 0–100)

## 2011-12-27 LAB — PROTIME-INR: INR: 1.03 (ref 0.00–1.49)

## 2011-12-27 LAB — CBC
HCT: 43.2 % (ref 39.0–52.0)
HCT: 35.9 % — ABNORMAL LOW (ref 39.0–52.0)
Hemoglobin: 14 g/dL (ref 13.0–17.0)
MCH: 27.3 pg (ref 26.0–34.0)
MCHC: 32.4 g/dL (ref 30.0–36.0)
MCV: 83.1 fL (ref 78.0–100.0)
RBC: 5.14 MIL/uL (ref 4.22–5.81)
RBC: 4.32 MIL/uL (ref 4.22–5.81)
WBC: 6.3 10*3/uL (ref 4.0–10.5)
WBC: 7.2 10*3/uL (ref 4.0–10.5)

## 2011-12-27 LAB — SAMPLE TO BLOOD BANK

## 2011-12-27 LAB — APTT: aPTT: 24 seconds (ref 24–37)

## 2011-12-27 MED ORDER — SODIUM CHLORIDE 0.9 % IV BOLUS (SEPSIS)
1000.0000 mL | Freq: Once | INTRAVENOUS | Status: AC
  Administered 2011-12-27: 1000 mL via INTRAVENOUS

## 2011-12-27 NOTE — ED Notes (Signed)
Requested pt void x2 pt attempted and still unable. Will request again in a few minutes.

## 2011-12-27 NOTE — Consult Note (Signed)
Name: [name] MRN: [id number] DOB: 1946-03-02  LOS: 0  CRITICAL CARE CONSULT NOTE  History of Present Illness: 66 y/o male with known diverticulosis presented to the [facility] ED on 12/27/11 with the acute onset of a lower GI bleed.  He states that he had been feeling well at home today but after dinner he noted the abrupt onset of pressure in his lower abdomen and then passed bright red blood per rectum.  This occurred three times at home over the span of about an hour.  When he arrived to the ED he initially had normal vital signs, however about 90 minutes after arrival he became hypotensive and tachycardic.  This occurred when he felt pressure in his abdomen and then passed blood tinged watery stool.  He then received 700cc of NS IV and his blood pressure and heart rate improved to baseline.  He denies pain, fever, or chills.  No nausea, vomiting, or abdominal pain.  Lines / Drains:   Cultures / Sepsis markers:   Antibiotics:   Tests / Events:      Past Medical History  Diagnosis Date  . Diverticulitis   . Inflammatory polyps of colon    Past Surgical History  Procedure Date  . Diverticulitis    Prior to Admission medications   Medication Sig Start Date End Date Taking? Authorizing Provider  atorvastatin (LIPITOR) 40 MG tablet Take 40 mg by mouth daily.   Yes Historical Provider, MD  lisinopril (PRINIVIL,ZESTRIL) 2.5 MG tablet Take 2.5 mg by mouth daily.   Yes Historical Provider, MD  Tamsulosin HCl (FLOMAX) 0.4 MG CAPS Take 0.4 mg by mouth daily.   Yes Historical Provider, MD   Allergies  Allergen Reactions  . Penicillins Itching and Rash   No family history on file. Social History  does not have a smoking history on file. His smokeless tobacco use includes Chew. He reports that he drinks alcohol. He reports that he does not use illicit drugs.  Review Of Systems   Gen: Denies fever, chills, weight change, fatigue, night sweats HEENT: Denies blurred vision, double  vision, hearing loss, tinnitus, sinus congestion, rhinorrhea, sore throat, neck stiffness, dysphagia PULM: Denies shortness of breath, cough, sputum production, hemoptysis, wheezing CV: Denies chest pain, edema, orthopnea, paroxysmal nocturnal dyspnea, palpitations GI: per HPI GU: Denies dysuria, hematuria, polyuria, oliguria, urethral discharge Endocrine: Denies hot or cold intolerance, polyuria, polyphagia or appetite change Derm: Denies rash, dry skin, scaling or peeling skin change Heme: Denies easy bruising, bleeding, bleeding gums Neuro: Denies headache, numbness, weakness, slurred speech, loss of memory or consciousness  Vital Signs:   Filed Vitals:   12/27/11 2300  BP: 116/66  Pulse: 77  Temp:   Resp: 18  O2: 100% RA  Physical Examination: Gen: well appearing, no acute distress HEENT: NCAT, PERRL, EOMi, OP clear, MMM Neck: supple without masses PULM: CTA B CV: RRR, no mgr, no JVD AB: BS+, soft, slightly tender in the left lower quadrant, no guarding or rebound tenderness, no hsm Ext: warm, no edema, no clubbing, no cyanosis Derm: no rash or skin breakdown Neuro: A&Ox4, CN II-XII intact, strength 5/5 in all 4 extremities Psyche: Normal mood and affect  Labs and Imaging:   CBC    Component Value Date/Time   WBC 7.2 12/27/2011 2249   RBC 4.32 12/27/2011 2249   HGB 11.8* 12/27/2011 2249   HCT 35.9* 12/27/2011 2249   PLT 150 12/27/2011 2249   MCV 83.1 12/27/2011 2249   MCH 27.3 12/27/2011  2249   MCHC 32.9 12/27/2011 2249   RDW 15.6* 12/27/2011 2249   LYMPHSABS 2.4 12/27/2011 2104   MONOABS 0.3 12/27/2011 2104   EOSABS 0.1 12/27/2011 2104   BASOSABS 0.0 12/27/2011 2104    BMET    Component Value Date/Time   NA 143 12/27/2011 2234   K 3.4* 12/27/2011 2234   CL 108 12/27/2011 2234   CO2 27 12/27/2011 2104   GLUCOSE 143* 12/27/2011 2234   BUN 16 12/27/2011 2234   CREATININE 1.20 12/27/2011 2234   CALCIUM 9.7 12/27/2011 2104   GFRNONAA 71* 12/27/2011 2104   GFRAA 82* 12/27/2011  2104   PT 13.7 INR 1.03   Assessment and Plan:  66 y/o male with known colonic diverticulosis presented this evening with the acute onset of bright red blood per rectum.  He states that this is similar to a presentation for diverticular bleeding that he had three years ago.  That is the most likely diagnosis.  He was transiently hypotensive in the ED but after receiving just 700cc of NS his blood pressure and heart rate improved dramatically.    Diverticulosis of colon with hemorrhage (12/27/2011)   Assessment: bleeding appears to have slowed as he most recently passed blood tinged, watery diarrhea.  Currently hemodynamically stable, Hct has dropped from 40 to 36.   Plan:  -admit to stepdown on TRH -serial hct -would transfuse if bleeding recurs or if Hct < 24 -volume resuscitate as needed -GI consult  HYPERTENSION NEC (01/06/2008)   Assessment:    Plan:  -hold BP meds  ICU status not indicated at this time.  PCCM will sign off but will be happy to assist as needed.  Heber [place], M.D. Pulmonary and Critical Care Medicine [facility] Pager: [phone]  12/27/2011, 11:21 PM

## 2011-12-27 NOTE — ED Notes (Signed)
Received pt. From triage via w/c with c/o rectal bleeding, pt. Transferred to stretcher gait steady, pt. Denies dizziness or weakness

## 2011-12-27 NOTE — ED Notes (Signed)
PT. REPORTS BLOODY DIARRHEA (X3) THIS EVENING WITH MID/LOW ABDOMINAL CRAMPING , STATES HISTORY OF DIVERTICULITIS AND COLON POLYPS , DENIES NAUSEA OR VOMITTING .

## 2011-12-27 NOTE — ED Provider Notes (Signed)
History     CSN: [id number]  Arrival date & time 12/27/11  2024   First MD Initiated Contact with Patient 12/27/11 2148      Chief Complaint  Patient presents with  . Rectal Bleeding    (Consider location/radiation/quality/duration/timing/severity/associated sxs/prior treatment) Patient is a 66 y.o. male presenting with hematochezia. The history is provided by the patient.  Rectal Bleeding  The current episode started today. Associated symptoms include abdominal pain and diarrhea. Pertinent negatives include no nausea, no vomiting, no chest pain, no headaches and no rash.   patient had acute onset of bloody diarrhea x3 this evening. He had some abdominal cramping, but states it felt like he had to have diarrhea. No nausea or vomiting. Previous history of diverticulitis and rectal bleeding that has required hospitalization and transfusion. He states he started to feel dizzy since he's been here. No other bleeding. No chest pain. No headaches. No easy bruising.  Past Medical History  Diagnosis Date  . Diverticulitis   . Inflammatory polyps of colon     Past Surgical History  Procedure Date  . Diverticulitis     No family history on file.  History  Substance Use Topics  . Smoking status: Not on file  . Smokeless tobacco: Current User    Types: Chew  . Alcohol Use: Yes      Review of Systems  Constitutional: Negative for activity change and appetite change.  HENT: Negative for neck stiffness.   Eyes: Negative for pain.  Respiratory: Negative for chest tightness and shortness of breath.   Cardiovascular: Negative for chest pain and leg swelling.  Gastrointestinal: Positive for abdominal pain, diarrhea, blood in stool and hematochezia. Negative for nausea and vomiting.  Genitourinary: Negative for flank pain.  Musculoskeletal: Negative for back pain.  Skin: Negative for rash.  Neurological: Positive for dizziness. Negative for weakness, numbness and headaches.    Psychiatric/Behavioral: Negative for behavioral problems.    Allergies  Penicillins  Home Medications   Current Outpatient Rx  Name Route Sig Dispense Refill  . ATORVASTATIN CALCIUM 40 MG PO TABS Oral Take 40 mg by mouth daily.    Marland Kitchen LISINOPRIL 2.5 MG PO TABS Oral Take 2.5 mg by mouth daily.    Marland Kitchen TAMSULOSIN HCL 0.4 MG PO CAPS Oral Take 0.4 mg by mouth daily.      BP 105/67  Pulse 79  Temp(Src) 98.4 F (36.9 C) (Oral)  Resp 18  SpO2 97%  Physical Exam  Nursing note and vitals reviewed. Constitutional: He is oriented to person, place, and time. He appears well-developed and well-nourished.  HENT:  Head: Normocephalic and atraumatic.  Eyes: EOM are normal. Pupils are equal, round, and reactive to light.  Neck: Normal range of motion. Neck supple.  Cardiovascular: Normal rate, regular rhythm and normal heart sounds.   No murmur heard.      Patient is tachycardic and hypotensive on my initial evaluation.   Pulmonary/Chest: Effort normal and breath sounds normal.  Abdominal: Soft. Bowel sounds are normal. He exhibits no distension and no mass. There is no tenderness. There is no rebound and no guarding.  Musculoskeletal: Normal range of motion. He exhibits no edema.  Neurological: He is alert and oriented to person, place, and time. No cranial nerve deficit.  Skin: Skin is warm and dry.  Psychiatric: He has a normal mood and affect.    ED Course  Procedures (including critical care time)  Labs Reviewed  CBC - Abnormal; Notable for the following:  RDW 15.6 (*)    All other components within normal limits  COMPREHENSIVE METABOLIC PANEL - Abnormal; Notable for the following:    Glucose, Bld 173 (*)    Total Bilirubin 0.2 (*)    GFR calc non Af Amer 71 (*)    GFR calc Af Amer 82 (*)    All other components within normal limits  POCT I-STAT, CHEM 8 - Abnormal; Notable for the following:    Potassium 3.4 (*)    Glucose, Bld 143 (*)    All other components within normal  limits  DIFFERENTIAL  SAMPLE TO BLOOD BANK  PROTIME-INR  APTT  TYPE AND SCREEN  URINALYSIS, ROUTINE W REFLEX MICROSCOPIC  CBC   No results found.   1. GI BLEED     CRITICAL CARE Performed by: Billee Cashing   Total critical care time: 30  Critical care time was exclusive of separately billable procedures and treating other patients.  Critical care was necessary to treat or prevent imminent or life-threatening deterioration.  Critical care was time spent personally by me on the following activities: development of treatment plan with patient and/or surrogate as well as nursing, discussions with consultants, evaluation of patient's response to treatment, examination of patient, obtaining history from patient or surrogate, ordering and performing treatments and interventions, ordering and review of laboratory studies, ordering and review of radiographic studies, pulse oximetry and re-evaluation of patient's condition.  MDM  GI bleed with hypotension and tachycardia. And history GI bleed. Patient became hypotensive during my evaluation. Pressure went down to the 60s he became less responsive. His mental status and blood pressure improved with IV fluids. His initial hemoglobin was 14, but expect this to drop. Critical care was counseled to. They believe it to be medical admission.        Juliet Rude. Rubin Payor, MD 12/27/11 2316

## 2011-12-28 ENCOUNTER — Encounter (HOSPITAL_COMMUNITY): Payer: Self-pay | Admitting: Internal Medicine

## 2011-12-28 DIAGNOSIS — K922 Gastrointestinal hemorrhage, unspecified: Secondary | ICD-10-CM

## 2011-12-28 DIAGNOSIS — K5731 Diverticulosis of large intestine without perforation or abscess with bleeding: Principal | ICD-10-CM

## 2011-12-28 LAB — COMPREHENSIVE METABOLIC PANEL
ALT: 25 U/L (ref 0–53)
Albumin: 3.1 g/dL — ABNORMAL LOW (ref 3.5–5.2)
Calcium: 9 mg/dL (ref 8.4–10.5)
GFR calc Af Amer: >90 mL/min (ref >90)
Glucose, Bld: 140 mg/dL — ABNORMAL HIGH (ref 70–99)
Sodium: 140 mEq/L (ref 135–145)
Total Protein: 6.5 g/dL (ref 6.0–8.3)

## 2011-12-28 LAB — CBC
HCT: 34.9 % — ABNORMAL LOW (ref 39.0–52.0)
HCT: 34.7 % — ABNORMAL LOW (ref 39.0–52.0)
Hemoglobin: 11.4 g/dL — ABNORMAL LOW (ref 13.0–17.0)
Hemoglobin: 12.1 g/dL — ABNORMAL LOW (ref 13.0–17.0)
Hemoglobin: 11.4 g/dL — ABNORMAL LOW (ref 13.0–17.0)
MCH: 27.5 pg (ref 26.0–34.0)
MCHC: 32.7 g/dL (ref 30.0–36.0)
MCHC: 33.2 g/dL (ref 30.0–36.0)
MCV: 82.3 fL (ref 78.0–100.0)
MCV: 83 fL (ref 78.0–100.0)
Platelets: 177 10*3/uL (ref 150–400)
RBC: 4.4 MIL/uL (ref 4.22–5.81)
WBC: 7.1 10*3/uL (ref 4.0–10.5)

## 2011-12-28 LAB — TSH: TSH: 1.272 u[IU]/mL (ref 0.350–4.500)

## 2011-12-28 LAB — MAGNESIUM: Magnesium: 2.2 mg/dL (ref 1.5–2.5)

## 2011-12-28 LAB — MRSA PCR SCREENING: MRSA by PCR: NEGATIVE

## 2011-12-28 LAB — PHOSPHORUS: Phosphorus: 3.6 mg/dL (ref 2.3–4.6)

## 2011-12-28 MED ORDER — GUAIFENESIN-DM 100-10 MG/5ML PO SYRP
5.0000 mL | ORAL_SOLUTION | ORAL | Status: DC | PRN
  Filled 2011-12-28: qty 5

## 2011-12-28 MED ORDER — ALUM & MAG HYDROXIDE-SIMETH 200-200-20 MG/5ML PO SUSP
30.0000 mL | Freq: Four times a day (QID) | ORAL | Status: DC | PRN
Start: 2011-12-28 — End: 2011-12-31

## 2011-12-28 MED ORDER — TAMSULOSIN HCL 0.4 MG PO CAPS
0.4000 mg | ORAL_CAPSULE | Freq: Every day | ORAL | Status: DC
  Administered 2011-12-28 – 2011-12-31 (×4): 0.4 mg via ORAL
  Filled 2011-12-28 (×5): qty 1

## 2011-12-28 MED ORDER — ONDANSETRON HCL 4 MG/2ML IJ SOLN: 4.0000 mg | Freq: Four times a day (QID) | INTRAMUSCULAR | Status: DC | PRN

## 2011-12-28 MED ORDER — ACETAMINOPHEN 650 MG RE SUPP: 650.0000 mg | Freq: Four times a day (QID) | RECTAL | Status: DC | PRN

## 2011-12-28 MED ORDER — ONDANSETRON HCL 4 MG PO TABS: 4.0000 mg | ORAL_TABLET | Freq: Four times a day (QID) | ORAL | Status: DC | PRN

## 2011-12-28 MED ORDER — SODIUM CHLORIDE 0.9 % IV SOLN
INTRAVENOUS | Status: AC
  Administered 2011-12-28: 125 mL/h via INTRAVENOUS

## 2011-12-28 MED ORDER — ACETAMINOPHEN 325 MG PO TABS: 650.0000 mg | ORAL_TABLET | Freq: Four times a day (QID) | ORAL | Status: DC | PRN

## 2011-12-28 MED ORDER — ALBUTEROL SULFATE (5 MG/ML) 0.5% IN NEBU: 2.5000 mg | INHALATION_SOLUTION | RESPIRATORY_TRACT | Status: DC | PRN

## 2011-12-28 MED ORDER — PANTOPRAZOLE SODIUM 40 MG IV SOLR
40.0000 mg | Freq: Two times a day (BID) | INTRAVENOUS | Status: DC
  Administered 2011-12-28 (×2): 40 mg via INTRAVENOUS
  Filled 2011-12-28 (×3): qty 40

## 2011-12-28 NOTE — ED Notes (Signed)
Report called pt. To be transported to the floor via stretcher with cardiac monitor, NAD noted

## 2011-12-28 NOTE — H&P (Addendum)
PCP:  Dr. Azucena Cecil  Chief Complaint:   Blood per rectum  HPI: OZIE LUPE is a 66 y.o. male   has a past medical history of Diverticulitis and Inflammatory polyps of colon.   Presented with  5 episodes of blood per rectum with bright red blood starting today. He has not been taking any blood thiners or aspirin. Last week he took aleve twice a day for about a week for hip pain. No abdominal pain, no nausea or vomiting no  hematoemsis. Of note he had similar episode 3 years ago and undergone colonoscopy that showed diverticular disease.  While in emergency department he developed episode of hypo-tension down to 60. Patient did not lose consciousness. This was a short episode likely vasovagal in nature. When patient first presented his hemoglobin was around 14 on repeat the hemoglobin is 11.8 His last episode of blood per rectum was one hour prior to my interview.  Review of Systems:    Pertinent positives include: blood in stool, hip pain last week  Constitutional:  No weight loss, night sweats, Fevers, chills, fatigue.  HEENT:  No headaches, Difficulty swallowing,Tooth/dental problems,Sore throat,  No sneezing, itching, ear ache, nasal congestion, post nasal drip,  Cardio-vascular:  No chest pain, Orthopnea, PND, anasarca, dizziness, palpitations.no Bilateral lower extremity swelling  GI:  No heartburn, indigestion, abdominal pain, nausea, vomiting, diarrhea, change in bowel habits, loss of appetite, melena,  hematoemesis Resp:  no shortness of breath at rest. No dyspnea on exertion, No excess mucus, no productive cough, No non-productive cough, No coughing up of blood.No change in color of mucus.No wheezing.No chest wall deformity  Skin:  no rash or lesions.  GU:  no dysuria, change in color of urine, no urgency or frequency. No flank pain.  Musculoskeletal:  No joint pain or swelling. No decreased range of motion. No back pain.  Psych:  No change in mood or affect. No  depression or anxiety. No memory loss.  Neuro: no localizing neurological complaints, no tingling, no weakness, no double vision, no gait abnormality, no slurred speech   Otherwise ROS are negative except for above, 10 systems were reviewed  Past Medical History: Past Medical History  Diagnosis Date  . Diverticulitis   . Inflammatory polyps of colon    Past Surgical History  Procedure Date  . Diverticulitis      Medications: Prior to Admission medications   Medication Sig Start Date End Date Taking? Authorizing Provider  atorvastatin (LIPITOR) 40 MG tablet Take 40 mg by mouth daily.   Yes Historical Provider, MD  lisinopril (PRINIVIL,ZESTRIL) 2.5 MG tablet Take 2.5 mg by mouth daily.   Yes Historical Provider, MD  Tamsulosin HCl (FLOMAX) 0.4 MG CAPS Take 0.4 mg by mouth daily.   Yes Historical Provider, MD    Allergies:   Allergies  Allergen Reactions  . Penicillins Itching and Rash    Social History:  Ambulatory independently Lives at home with wife   reports that he has never smoked. His smokeless tobacco use includes Chew. He reports that he drinks alcohol. He reports that he does not use illicit drugs.   Family History: family history includes Cancer in his father; Diabetes in his mother; Heart disease in his mother; and Hypertension in his mother.    Physical Exam: Patient Vitals for the past 24 hrs:  BP Temp Temp src Pulse Resp SpO2  12/27/11 2345 114/71 mmHg - - 80  - 98 %  12/27/11 2335 115/68 mmHg - - 90  16  99 %  12/27/11 2330 115/68 mmHg - - 78  15  99 %  12/27/11 2315 112/73 mmHg - - 81  14  99 %  12/27/11 2300 116/66 mmHg - - 77  18  97 %  12/27/11 2245 105/67 mmHg - - 79  18  97 %  12/27/11 2230 100/69 mmHg - - 83  17  97 %  12/27/11 2215 95/63 mmHg - - 88  20  96 %  12/27/11 2214 94/60 mmHg - - 86  - 97 %  12/27/11 2200 66/45 mmHg - - 96  26  96 %  12/27/11 2145 87/52 mmHg - - 112  25  97 %  12/27/11 2130 102/70 mmHg - - 110  20  96 %    12/27/11 2056 114/80 mmHg 98.4 F (36.9 C) Oral 115  20  98 %    1. General:  in No Acute distress 2. Psychological: Alert and Oriented 3. Head/ENT:   Moist  Mucous Membranes                          Head Non traumatic, neck supple                          Normal  Dentition 4. SKIN: normal Skin turgor,  Skin clean Dry and intact no rash 5. Heart: Regular rate and rhythm no Murmur, Rub or gallop 6. Lungs: Clear to auscultation bilaterally, no wheezes or crackles   7. Abdomen: Soft, non-tender, Non distended 8. Lower extremities: no clubbing, cyanosis, or edema 9. Neurologically Grossly intact, moving all 4 extremities equally 10. MSK: Normal range of motion Bright red blood at bedside in bed pan body mass index is unknown because there is no height or weight on file.   Labs on Admission:   [facility] 12/27/11 2234 12/27/11 2104  NA 143 139  K 3.4* 3.5  CL 108 103  CO2 -- 27  GLUCOSE 143* 173*  BUN 16 15  CREATININE 1.20 1.08  CALCIUM -- 9.7  MG -- --  PHOS -- --    Basename 12/27/11 2104  AST 23  ALT 32  ALKPHOS 64  BILITOT 0.2*  PROT 7.7  ALBUMIN 3.8   No results found for this basename: LIPASE:2,AMYLASE:2 in the last 72 hours  Basename 12/27/11 2249 12/27/11 2234 12/27/11 2104  WBC 7.2 -- 6.3  NEUTROABS -- -- 3.4  HGB 11.8* 13.6 --  HCT 35.9* 40.0 --  MCV 83.1 -- 84.0  PLT 150 -- 207   No results found for this basename: CKTOTAL:3,CKMB:3,CKMBINDEX:3,TROPONINI:3 in the last 72 hours No results found for this basename: TSH,T4TOTAL,FREET3,T3FREE,THYROIDAB in the last 72 hours No results found for this basename: VITAMINB12:2,FOLATE:2,FERRITIN:2,TIBC:2,IRON:2,RETICCTPCT:2 in the last 72 hours No results found for this basename: HGBA1C    The CrCl is unknown because both a height and weight (above a minimum accepted value) are required for this calculation. ABG    Component Value Date/Time   TCO2 24 12/27/2011 2234     No results found for this basename:  DDIMER     Assessment/Plan  This is a 66 year old gentleman with history of diverticular disease in the past who presents with bright red blood per rectum likely another episode.  Present on Admission:  .GI BLEED - admit to step down, most likely lower GI source. Will monitor carefully hemoglobin transfuse if needed or if patient has severe bleeding  episode. Give IV fluids. In a.m. call GI also not if compensates. Given that he was on Advil last week will cover with Protonix but upper source very unlikely BUN 15 no history of peptic ulcer disease. The patient is hemodynamically stable  .HYPERTENSION NEC - hold home meds  Hypotension -transient will hold home medications, monitor in stepdown Anemia - monitor CBC q 6h and transfuse if needed.  Prophylaxis: SCD  Protonix  CODE STATUS: Full code  Kenley Troop 12/28/2011, 12:04 AM

## 2011-12-28 NOTE — Progress Notes (Signed)
Patient had medium sized bowel movement. The BM was bright red and loose in appearance.  Patient is stable and resting back in bed.  Will monitor patient.  Timmie Foerster, RN

## 2011-12-28 NOTE — Progress Notes (Signed)
   CARE MANAGEMENT NOTE 12/28/2011  Patient:  [name], [name]   Account Number:  [id number]  Date Initiated:  12/28/2011  Documentation initiated by:  Modupe Shampine  Subjective/Objective Assessment:   PT WAS ADMITTED WITH GI BLD.     Action/Plan:   PROGRESSION OF CARE AND DISCHARGE PLANNING   Anticipated DC Date:  12/30/2011   Anticipated DC Plan:  HOME/SELF CARE      DC Planning Services  CM consult      Choice offered to / List presented to:             Status of service:  In process, will continue to follow Medicare Important Message given?   (If response is "NO", the following Medicare IM given date fields will be blank) Date Medicare IM given:   Date Additional Medicare IM given:    Discharge Disposition:    Per UR Regulation:  Reviewed for med. necessity/level of care/duration of stay  Comments:  UR COMPLETED Onnie Boer ,RN, BSN 1616 PT WAS ADMITTED WITH GI BLD.  PTA PT WAS AT HOME WITH SELF CARE.  WILL F/U ON DC NEEDS, WILL TALK TO PT ABOUT HAVING NO PCP.

## 2011-12-28 NOTE — Progress Notes (Signed)
Subjective: Mr Houde presented with 5 episodes of blood per rectum. He had another episode this AM around 7 o' clock. No dizziness or abdominal.   Objective: Blood pressure 112/77, pulse 77, temperature 97.7 F (36.5 C), temperature source Oral, resp. rate 18, height 6\' 3"  (1.905 m), weight 106.7 kg (235 lb 3.7 oz), SpO2 99.00%. Weight change:   Intake/Output Summary (Last 24 hours) at 12/28/11 1523 Last data filed at 12/28/11 0757  Gross per 24 hour  Intake  22.92 ml  Output      1 ml  Net  21.92 ml    Physical Exam: General appearance: alert and cooperative Throat: lips, mucosa, and tongue normal; teeth and gums normal Lungs: clear to auscultation bilaterally Heart: regular rate and rhythm, S1, S2 normal, no murmur, click, rub or gallop Abdomen: soft, non-tender; bowel sounds normal; no masses,  no organomegaly Extremities: extremities normal, atraumatic, no cyanosis or edema  Lab Results:  Basename 12/28/11 0409 12/27/11 2234 12/27/11 2104  NA 140 143 --  K 3.9 3.4* --  CL 108 108 --  CO2 25 -- 27  GLUCOSE 140* 143* --  BUN 17 16 --  CREATININE 0.97 1.20 --  CALCIUM 9.0 -- 9.7  MG 2.2 -- --  PHOS 3.6 -- --    Basename 12/28/11 0409 12/27/11 2104  AST 17 23  ALT 25 32  ALKPHOS 54 64  BILITOT 0.2* 0.2*  PROT 6.5 7.7  ALBUMIN 3.1* 3.8   No results found for this basename: LIPASE:2,AMYLASE:2 in the last 72 hours  Basename 12/28/11 1007 12/28/11 0409 12/27/11 2104  WBC 7.0 6.8 --  NEUTROABS -- -- 3.4  HGB 12.1* 11.4* --  HCT 36.5* 34.9* --  MCV 83.0 82.3 --  PLT 177 158 --   No results found for this basename: CKTOTAL:3,CKMB:3,CKMBINDEX:3,TROPONINI:3 in the last 72 hours No components found with this basename: POCBNP:3 No results found for this basename: DDIMER:2 in the last 72 hours No results found for this basename: HGBA1C:2 in the last 72 hours No results found for this basename: CHOL:2,HDL:2,LDLCALC:2,TRIG:2,CHOLHDL:2,LDLDIRECT:2 in the last 72  hours  Basename 12/28/11 0409  TSH 1.272  T4TOTAL --  T3FREE --  THYROIDAB --   No results found for this basename: VITAMINB12:2,FOLATE:2,FERRITIN:2,TIBC:2,IRON:2,RETICCTPCT:2 in the last 72 hours  Micro Results: Recent Results (from the past 240 hour(s))  MRSA PCR SCREENING     Status: Normal   Collection Time   12/28/11  1:34 AM      Component Value Range Status Comment   MRSA by PCR NEGATIVE  NEGATIVE  Final     Studies/Results: No results found.  Medications: Scheduled Meds:   . sodium chloride  1,000 mL Intravenous Once  . DISCONTD: pantoprazole (PROTONIX) IV  40 mg Intravenous Q12H   Continuous Infusions:   . sodium chloride 125 mL/hr at 12/28/11 1000   PRN Meds:.acetaminophen, acetaminophen, albuterol, alum & mag hydroxide-simeth, guaiFENesin-dextromethorphan, ondansetron (ZOFRAN) IV, ondansetron  Assessment/Plan: Principal Problem:  *GI BLEED- possible diverticular- Will be consulting [facility] who he has seen in the past. Will cont to follow hemoglobin for need of transfusion. Continue IVF to replace volume. GI has requested we start him on clear liquids.   Active Problems: Anemia- due to acute blood loss- follow Q12 hrs.   HYPERTENSION- currently hypotensive. Cont to hold antihypertensives.   Diverticulosis of colon with hemorrhage- 2 yrs BPH- Flomax on hold. Can resume this tonight.    LOS: 1 day   [facility] [phone] 12/28/2011, 3:23 PM

## 2011-12-28 NOTE — Consult Note (Signed)
[facility] Consult: 2:36 PM 12/28/2011   Referring Provider: dr Isidoro Donning  Primary Care Physician:  [name], [name] Primary Gastroenterologist:  Dr.    Jaquita Rector for Consultation:  Hematochezia  HPI: [name] is a 66 y.o. male. Hx hematochezia 01/2009 colonoscopy showed.diverticulosis and colon polyp.  Required 2 units blood then. In 2007 he had post polypectomy bleed that led to repeat colonoscopy and endoclipping of polypectomy site, 2 units of blood were transfused.  Admitted early this AM with recurrent painless hematochezia. Started yesterday evening and volume of blood was large, 6 episodes so far.  Last was 7 AM today.  No nausea.  No hx scant heme PR. Using twice daily Aleve for one week, for hip pain. Hgb 14.0 in ED, 12.1 this morning.  Coags and BUN normal.        Past Medical History  Diagnosis Date  . Inflammatory polyps of colon   . Pneumothorax 2009    spontaneous, treated with chest tube.  Dr Lowella Fairy.  Marland Kitchen Hx of adenomatous colonic polyps 2007  . Hemorrhage of colon following colonoscopy 2007    post polypectomy bleed 2007, treated with endo clip and epi injection.  . Acute blood loss anemia 2007, 2010    from GI bleeds, required 2 PRBCs each episode.   Marland Kitchen Glaucoma     Past Surgical History  Procedure Date  . Tonsillectomy     Prior to Admission medications   Medication Sig Start Date End Date Taking? Authorizing Provider  atorvastatin (LIPITOR) 40 MG tablet Take 40 mg by mouth daily.   Yes Historical Provider, MD  lisinopril (PRINIVIL,ZESTRIL) 2.5 MG tablet Take 2.5 mg by mouth daily.   Yes Historical Provider, MD  Tamsulosin HCl (FLOMAX) 0.4 MG CAPS Take 0.4 mg by mouth daily.   Yes Historical Provider, MD    Scheduled Meds:    . pantoprazole (PROTONIX) IV  40 mg Intravenous Q12H  . sodium chloride  1,000 mL Intravenous Once   Infusions:    . sodium chloride 125 mL/hr at 12/28/11 1000   PRN  Meds: acetaminophen, acetaminophen, albuterol, alum & mag hydroxide-simeth, guaiFENesin-dextromethorphan, ondansetron (ZOFRAN) IV, ondansetron   Allergies as of 12/27/2011 - Review Complete 12/27/2011  Allergen Reaction Noted  . Penicillins Itching and Rash     Family History  Problem Relation Age of Onset  . Heart disease Mother   . Diabetes Mother   . Hypertension Mother   . Cancer Father     History   Social History  . Marital Status: Married    Spouse Name: N/A    Number of Children: N/A  . Years of Education: N/A   Occupational History  . Not on file.   Social History Main Topics  . Smoking status: Never Smoker   . Smokeless tobacco: Current User    Types: Chew  . Alcohol Use: Yes     occasionaly  . Drug Use: No  . Sexually Active:    Other Topics Concern  . Not on file   Social History Narrative  . No narrative on file    REVIEW OF SYSTEMS: Constitutional:  Stable weight,good energy level. 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  CV:  No chest pain, no palpitations. Last night in the ED did feel dizzy. GU:  No nocturia. Prostatomegaly symptoms controlled well with Flomax GI:  No dysphasia, no constipation, no reflux symptoms. Doesn't require use  of any antacids or acid control and medications. Never sees even scant blood when he wipes Heme:  Has history of acute blood loss anemia described above.    Transfusions:  Transfused in 2000 06/05/2009 Neuro:  Dizziness last night. None today. No history of seizures, tremors or stroke. Derm:  No rashes or itching. No hot nonhealing sores Endocrine:  No excessive urination, excessive thirst. No prior history of diabetes but his blood sugars are currently elevated Immunization:  Flu vaccine up-to-date Musculoskeletal: Patient's right hip pain with pain in the buttock and radiating down the side of the right leg which was  present last week has resolved. This is the reason he was taking the Aleve. Normally he doesn't use Aleve.   PHYSICAL EXAM: Vital signs in last 24 hours: Temp:  [97.6 F (36.4 C)-98.4 F (36.9 C)] 97.7 F (36.5 C) (01/31 1222) Pulse Rate:  [67-115] 67  (01/31 1222) Resp:  [11-26] 16  (01/31 1222) BP: (66-135)/(45-85) 112/77 mmHg (01/31 1222) SpO2:  [96 %-100 %] 100 % (01/31 1222) Weight:  [235 lb 3.7 oz (106.7 kg)] 235 lb 3.7 oz (106.7 kg) (01/31 0131)  General: looks well, robust Head:  Normocephalic and atraumatic  Eyes:  No icterus, no conjunctival pallor Ears:  Not hard of hearing no hearing eights present.  Nose:  No discharge no nasal stuffiness Mouth:  Pink, moist, clear oral mucosa. Good dentition Neck:  No masses, JVD or bruits Lungs:  Clear to auscultation bilaterally. No cough. No dyspnea Heart:  Regular rate rhythm. No murmurs rubs gallops. Abdomen:  Soft, nontender, nondistended. No scars. No hernia. No masses. No hepatosplenomegaly.   Rectal: deferred. Passed red blood earlier this morning   Musc/Skeltl: no joint deformities or swelling. Extremities:  No pedal edema  Neurologic:  Fully alert and oriented x3. No tremor. Moves all 4 limbs easily Skin:  No rash, no sores. Tattoos:  none Nodes:  No cervical adenopathy   Psych:  Pleasant, relaxed, appropriate. Good historian  Intake/Output from previous day: 01/30 0701 - 01/31 0700 In: 22.9 [I.V.:22.9] Out: 1 [Urine:1] Intake/Output this shift:    LAB RESULTS:  Basename 12/28/11 1007 12/28/11 0409 12/27/11 2249  WBC 7.0 6.8 7.2  HGB 12.1* 11.4* 11.8*  HCT 36.5* 34.9* 35.9*  PLT 177 158 150   BMET Lab Results  Component Value Date   NA 140 12/28/2011   NA 143 12/27/2011   NA 139 12/27/2011   K 3.9 12/28/2011   K 3.4* 12/27/2011   K 3.5 12/27/2011   CL 108 12/28/2011   CL 108 12/27/2011   CL 103 12/27/2011   CO2 25 12/28/2011   CO2 27 12/27/2011   CO2 26 02/12/2009   GLUCOSE 140* 12/28/2011   GLUCOSE 143*  12/27/2011   GLUCOSE 173* 12/27/2011   BUN 17 12/28/2011   BUN 16 12/27/2011   BUN 15 12/27/2011   CREATININE 0.97 12/28/2011   CREATININE 1.20 12/27/2011   CREATININE 1.08 12/27/2011   CALCIUM 9.0 12/28/2011   CALCIUM 9.7 12/27/2011   CALCIUM 8.1* 02/12/2009   LFT Lab Results  Component Value Date   PROT 6.5 12/28/2011   PROT 7.7 12/27/2011   PROT 6.7 02/10/2009   ALBUMIN 3.1* 12/28/2011   ALBUMIN 3.8 12/27/2011   ALBUMIN 3.7 02/10/2009   AST 17 12/28/2011   AST 23 12/27/2011   AST 36 02/10/2009   ALT 25 12/28/2011   ALT 32 12/27/2011   ALT 56* 02/10/2009   ALKPHOS 54 12/28/2011   ALKPHOS  64 12/27/2011   ALKPHOS 65 02/10/2009   BILITOT 0.2* 12/28/2011   BILITOT 0.2* 12/27/2011   BILITOT 0.8 02/10/2009   PT/INR Lab Results  Component Value Date   INR 1.03 12/27/2011   INR 1.1 02/11/2009   INR 1.0 02/11/2009   RADIOLOGY STUDIES: No results found.  ENDOSCOPIC STUDIES: COLONOSCOPY   02/12/2009 INDICATIONS:  1) hematochezia 2) gastrointestinal hemorrhage  ENDOSCOPIC IMPRESSION:  1) Blood throughout the colon  2) Moderate diverticulosis in the sigmoid to descending  3) transverse colon polyp  RECOMMENDATIONS:  1) high fiber diet, start in 2 weeks  2) await pathology results :      Tubular Adenoma. No HGD. 3) treat as a presumed diverticular bleed  REPEAT EXAM: In 5 year(s) for Colonoscopy.  _______________________________  Venita Lick. Russella Dar, MD, [facility]    IMPRESSION: 1. Acute lower GI bleed. Probably recurrent diverticular bleed. Hemodynamically stable. 2. History of lower GI bleeds. Post polypectomy bleed in 2007. Diverticular bleed in 2010. 3.  Acute blood loss anemia. Although hemoglobin has dropped he does not currently require transfusion. 4.  History of adenomatous colon polyps in 2007 and again in 2010. 5.  Hyperglycemia. Range of serum glucose of 140-173. No prior diagnosis of diabetes mellitus.  Please note that although the patient and his wife have mentioned a history of  diverticulitis, he is never had this problem. Therefore I removed the diagnosis of diverticulitis from the medical history listing.  PLAN: 1. Observation,serial CBCs. He doesn't need CBCs every 6 hours, I'm going to adjust the timing on the labs. Also doesn't need IV Protonix, this is a lower GI bleed. 2.  Continue clear liquids.    LOS: 1 day   Jennye Moccasin  12/28/2011, 2:36 PM Pager: [phone]      ________________________________________________________________________  [facility] MD note:  I personally examined the patient, reviewed the data and agree with the assessment and plan described above.  Likely lower GI bleed, probably diverticular.  Has not required any blood so far, has been HD stable, seems like blood output is slowing down (last bleeding was about 7-8 hours ago).   Rob Bunting, MD [facility] Pager [phone]

## 2011-12-29 LAB — CBC
Hemoglobin: 10.6 g/dL — ABNORMAL LOW (ref 13.0–17.0)
MCH: 27.8 pg (ref 26.0–34.0)
MCV: 84 fL (ref 78.0–100.0)
RBC: 3.81 MIL/uL — ABNORMAL LOW (ref 4.22–5.81)

## 2011-12-29 LAB — URINALYSIS, ROUTINE W REFLEX MICROSCOPIC
Glucose, UA: 100 mg/dL — AB
Ketones, ur: NEGATIVE mg/dL
Leukocytes, UA: NEGATIVE
Protein, ur: NEGATIVE mg/dL
Urobilinogen, UA: 0.2 mg/dL (ref 0.0–1.0)

## 2011-12-29 NOTE — Progress Notes (Signed)
     [facility] Daily Rounding Note 12/29/2011, 10:52 AM  SUBJECTIVE: Very dark stool this AM, last fresh blood was yesterday AM.  Feels well.  Not dizzy.  OBJECTIVE: General: looks well  Vital signs in last 24 hours: Temp:  [97.6 F (36.4 C)-97.8 F (36.6 C)] 97.7 F (36.5 C) (02/01 0851) Pulse Rate:  [60-82] 76  (02/01 0851) Resp:  [13-22] 14  (02/01 0851) BP: (112-123)/(75-82) 120/75 mmHg (02/01 0851) SpO2:  [99 %-100 %] 100 % (02/01 0851) Weight:  [238 lb 5.1 oz (108.1 kg)] 238 lb 5.1 oz (108.1 kg) (02/01 0114) Last BM Date: 12/28/11  Heart: RRR Chest: clear B Abdomen: soft, NT, ND.  Active BS  Extremities: no edema Neuro/Psych:  Pleasant, no anxiety. No gross deficits.  Intake/Output from previous day: 01/31 0701 - 02/01 0700 In: -  Out: 1 [Urine:1]  Intake/Output this shift:    Lab Results:  Basename 12/29/11 0330 12/28/11 1537 12/28/11 1007  WBC 5.4 7.1 7.0  HGB 10.6* 11.4* 12.1*  HCT 32.0* 34.7* 36.5*  PLT 161 160 177   BMET  Basename 12/28/11 0409 12/27/11 2234 12/27/11 2104  NA 140 143 139  K 3.9 3.4* 3.5  CL 108 108 103  CO2 25 -- 27  GLUCOSE 140* 143* 173*  BUN 17 16 15   CREATININE 0.97 1.20 1.08  CALCIUM 9.0 -- 9.7   LFT  Basename 12/28/11 0409  PROT 6.5  ALBUMIN 3.1*  AST 17  ALT 25  ALKPHOS 54  BILITOT 0.2*  BILIDIR --  IBILI --   PT/INR  Basename 12/27/11 2156  LABPROT 13.7  INR 1.03   ASSESMENT: 1.  Hematochezia, presumed diverticular bleed.  Acute bleeding has ceased.  2.  ABL Anemia.  Hgb down about 1 gm last 24 hours, down about 1.5 grams overall. 3.  Hypokalemia resolved. 4.  Diabetic range serum glucoses. Note that he drinks 2 12 oz cans Pepsi daily. 5.  Recent hip pain and sciatica.  PLAN: 1.  Allow solids, will give diabetic diet. 2.  Transfer out of stepdown, does not need tele.  3.  CBC in AM. 4.  Consider giving limited Rx for po narcotics at d/c in case he needs pain control.  This would allow him to avoid  using NSAIDS.  LOS: 2 days   Jennye Moccasin  12/29/2011, 10:52 AM Pager: [phone]   ________________________________________________________________________  [facility] MD note:  I personally examined the patient, reviewed the data.  He has just had 2 bright red bloody movements in past 1-2 hours.  Feels fine otherwise.  Will watch another 12-24 hours before committing to colonosocpy (his bleeding stopped last time after about 2 units blood in 2010).   Rob Bunting, MD [facility] Pager [phone]

## 2011-12-29 NOTE — Progress Notes (Signed)
Patient is now having frequent large bright red stools. Vitals remain stable at this time will continue to monitor he is due to recheck Hgb at 1700 pm. Diet down graded back to clear liquids per MD order. Wife at bedside and patient instructed to call for anything.

## 2011-12-29 NOTE — Progress Notes (Signed)
Subjective: [name] presented with 5 episodes of blood per rectum. He stated to me that he had not had any further bleeding since yesterday but unfortunately began to bleed again a couple of hours after I saw him.   Objective: Blood pressure 120/75, pulse 76, temperature 97.7 F (36.5 C), temperature source Oral, resp. rate 14, height 6\' 3"  (1.905 m), weight 108.1 kg (238 lb 5.1 oz), SpO2 100.00%. Weight change: 1.4 kg (3 lb 1.4 oz)  Intake/Output Summary (Last 24 hours) at 12/29/11 1356 Last data filed at 12/28/11 1500  Gross per 24 hour  Intake      0 ml  Output      1 ml  Net     -1 ml    Physical Exam: General appearance: alert and cooperative Throat: lips, mucosa, and tongue normal; teeth and gums normal Lungs: clear to auscultation bilaterally Heart: regular rate and rhythm, S1, S2 normal, no murmur, click, rub or gallop Abdomen: soft, non-tender; bowel sounds normal; no masses,  no organomegaly Extremities: extremities normal, atraumatic, no cyanosis or edema  Lab Results:  [facility] 12/28/11 0409 12/27/11 2234 12/27/11 2104  NA 140 143 --  K 3.9 3.4* --  CL 108 108 --  CO2 25 -- 27  GLUCOSE 140* 143* --  BUN 17 16 --  CREATININE 0.97 1.20 --  CALCIUM 9.0 -- 9.7  MG 2.2 -- --  PHOS 3.6 -- --    Basename 12/28/11 0409 12/27/11 2104  AST 17 23  ALT 25 32  ALKPHOS 54 64  BILITOT 0.2* 0.2*  PROT 6.5 7.7  ALBUMIN 3.1* 3.8   No results found for this basename: LIPASE:2,AMYLASE:2 in the last 72 hours  Basename 12/29/11 0330 12/28/11 1537 12/27/11 2104  WBC 5.4 7.1 --  NEUTROABS -- -- 3.4  HGB 10.6* 11.4* --  HCT 32.0* 34.7* --  MCV 84.0 82.8 --  PLT 161 160 --   No results found for this basename: CKTOTAL:3,CKMB:3,CKMBINDEX:3,TROPONINI:3 in the last 72 hours No components found with this basename: POCBNP:3 No results found for this basename: DDIMER:2 in the last 72 hours No results found for this basename: HGBA1C:2 in the last 72 hours No results found  for this basename: CHOL:2,HDL:2,LDLCALC:2,TRIG:2,CHOLHDL:2,LDLDIRECT:2 in the last 72 hours  Basename 12/28/11 0409  TSH 1.272  T4TOTAL --  T3FREE --  THYROIDAB --   No results found for this basename: VITAMINB12:2,FOLATE:2,FERRITIN:2,TIBC:2,IRON:2,RETICCTPCT:2 in the last 72 hours  Micro Results: Recent Results (from the past 240 hour(s))  MRSA PCR SCREENING     Status: Normal   Collection Time   12/28/11  1:34 AM      Component Value Range Status Comment   MRSA by PCR NEGATIVE  NEGATIVE  Final     Studies/Results: No results found.  Medications: Scheduled Meds:    . Tamsulosin HCl  0.4 mg Oral Daily  . DISCONTD: pantoprazole (PROTONIX) IV  40 mg Intravenous Q12H   Continuous Infusions:  PRN Meds:.acetaminophen, acetaminophen, albuterol, alum & mag hydroxide-simeth, guaiFENesin-dextromethorphan, ondansetron (ZOFRAN) IV, ondansetron  Assessment/Plan: Principal Problem:  *GI BLEED- possible diverticular- being followed by [facility] who he has seen in the past. Will cont to follow hemoglobin for need of transfusion. Continue IVF to replace volume. Active Problems: Anemia- due to acute blood loss- follow Q12 hrs.   HYPERTENSION- BP not high enough to initiate meds. Cont to hold antihypertensives.   Diverticulosis of colon with hemorrhage- 2 yrs ago BPH- Flomax resumed    LOS: 2 days  [facility] [phone] 12/29/2011, 1:56 PM

## 2011-12-29 NOTE — Progress Notes (Signed)
SPOKE WITH THE PT AND HIS WIFE AND HE STATES THAT HE HAS [employer] AND A PCP AND THAT THE INFO WAS GATHERED IN THE ED.  NO DC NEEDS AT THIS TIME.  WILL F/U. Willa Rough [phone] OR [phone] 12/29/2011

## 2011-12-30 LAB — CBC
HCT: 25.9 % — ABNORMAL LOW (ref 39.0–52.0)
Hemoglobin: 8.5 g/dL — ABNORMAL LOW (ref 13.0–17.0)
MCH: 27.2 pg (ref 26.0–34.0)
MCV: 83 fL (ref 78.0–100.0)
RBC: 3.12 MIL/uL — ABNORMAL LOW (ref 4.22–5.81)

## 2011-12-30 MED ORDER — SODIUM CHLORIDE 0.9 % IV BOLUS (SEPSIS)
500.0000 mL | Freq: Once | INTRAVENOUS | Status: AC
  Administered 2011-12-30: 500 mL via INTRAVENOUS

## 2011-12-30 MED ORDER — SODIUM CHLORIDE 0.45 % IV SOLN: INTRAVENOUS | Status: DC

## 2011-12-30 MED ORDER — SODIUM CHLORIDE 0.9 % IV SOLN
INTRAVENOUS | Status: DC
  Administered 2011-12-30: 08:00:00 via INTRAVENOUS

## 2011-12-30 NOTE — Progress Notes (Signed)
Subjective: Last episode of bloody stool was last night. He had no complaints of dizziness, shortness of breath or chest pain.   Objective: Blood pressure 98/57, pulse 84, temperature 98 F (36.7 C), temperature source Oral, resp. rate 17, height 6\' 3"  (1.905 m), weight 106.5 kg (234 lb 12.6 oz), SpO2 100.00%. Weight change: -1.6 kg (-3 lb 8.4 oz)  Intake/Output Summary (Last 24 hours) at 12/30/11 0859 Last data filed at 12/30/11 0825  Gross per 24 hour  Intake    625 ml  Output      5 ml  Net    620 ml    Physical Exam: General appearance: alert and cooperative Throat: lips, mucosa, and tongue normal; teeth and gums normal Lungs: clear to auscultation bilaterally Heart: regular rate and rhythm, S1, S2 normal, no murmur, click, rub or gallop Abdomen: soft, non-tender; bowel sounds normal; no masses,  no organomegaly Extremities: extremities normal, atraumatic, no cyanosis or edema  Lab Results:  [facility] 12/28/11 0409 12/27/11 2234 12/27/11 2104  NA 140 143 --  K 3.9 3.4* --  CL 108 108 --  CO2 25 -- 27  GLUCOSE 140* 143* --  BUN 17 16 --  CREATININE 0.97 1.20 --  CALCIUM 9.0 -- 9.7  MG 2.2 -- --  PHOS 3.6 -- --    [facility] 12/28/11 0409 12/27/11 2104  AST 17 23  ALT 25 32  ALKPHOS 54 64  BILITOT 0.2* 0.2*  PROT 6.5 7.7  ALBUMIN 3.1* 3.8   No results found for this basename: LIPASE:2,AMYLASE:2 in the last 72 hours  Basename 12/30/11 0630 12/29/11 1624 12/29/11 0330 12/27/11 2104  WBC 7.8 -- 5.4 --  NEUTROABS -- -- -- 3.4  HGB 8.5* 9.6* -- --  HCT 25.9* 28.8* -- --  MCV 83.0 -- 84.0 --  PLT 158 -- 161 --   No results found for this basename: CKTOTAL:3,CKMB:3,CKMBINDEX:3,TROPONINI:3 in the last 72 hours No components found with this basename: POCBNP:3 No results found for this basename: DDIMER:2 in the last 72 hours No results found for this basename: HGBA1C:2 in the last 72 hours No results found for this basename:  CHOL:2,HDL:2,LDLCALC:2,TRIG:2,CHOLHDL:2,LDLDIRECT:2 in the last 72 hours  Basename 12/28/11 0409  TSH 1.272  T4TOTAL --  T3FREE --  THYROIDAB --   No results found for this basename: VITAMINB12:2,FOLATE:2,FERRITIN:2,TIBC:2,IRON:2,RETICCTPCT:2 in the last 72 hours  Micro Results: Recent Results (from the past 240 hour(s))  MRSA PCR SCREENING     Status: Normal   Collection Time   12/28/11  1:34 AM      Component Value Range Status Comment   MRSA by PCR NEGATIVE  NEGATIVE  Final     Studies/Results: No results found.  Medications: Scheduled Meds:    . sodium chloride  500 mL Intravenous Once  . Tamsulosin HCl  0.4 mg Oral Daily   Continuous Infusions:    . sodium chloride 125 mL/hr at 12/30/11 0825  . DISCONTD: sodium chloride     PRN Meds:.acetaminophen, acetaminophen, albuterol, alum & mag hydroxide-simeth, guaiFENesin-dextromethorphan, ondansetron (ZOFRAN) IV, ondansetron  Assessment/Plan: Principal Problem:  *GI BLEED- possible diverticular- being followed by [facility] who he has seen in the past. Will cont to follow hemoglobin for need of transfusion. Continue IVF to replace volume. Diet per GI.  Active Problems: Anemia- due to acute blood loss- follow Q12 hrs until bleeding resolves.   HYPERTENSION- BP not high enough to initiate meds. Cont to hold antihypertensives.   Diverticulosis of colon with hemorrhage- 2 yrs ago  BPH- Flomax resumed    LOS: 3 days   [facility] [phone] 12/30/2011, 8:59 AM

## 2011-12-30 NOTE — Progress Notes (Signed)
Subjective: Some red bleeding yesterday, then tapered off.  Last BM was dark, around 7-8pm (12 hours ago).  CBC drawn this AM, still pending.  Yesterday 9.6 at 4pm.  Otherwise feels fine.  No abd pains.   Objective: Vital signs in last 24 hours: Temp:  [97.7 F (36.5 C)-98.1 F (36.7 C)] 97.7 F (36.5 C) (02/02 0433) Pulse Rate:  [76-122] 78  (02/02 0600) Resp:  [8-28] 8  (02/02 0600) BP: (94-120)/(54-75) 96/59 mmHg (02/02 0433) SpO2:  [98 %-100 %] 100 % (02/02 0600) Weight:  [234 lb 12.6 oz (106.5 kg)] 234 lb 12.6 oz (106.5 kg) (02/02 0000) Last BM Date: 12/29/11 General: alert and oriented times 3 Heart: regular rate and rythm Abdomen: soft, non-tender, non-distended, normal bowel sounds   Lab Results:  Basename 12/29/11 1624 12/29/11 0330 12/28/11 1537 12/28/11 1007  WBC -- 5.4 7.1 7.0  HGB 9.6* 10.6* 11.4* --  PLT -- 161 160 177  MCV -- 84.0 82.8 83.0   BMET  Basename 12/28/11 0409 12/27/11 2234 12/27/11 2104  NA 140 143 139  K 3.9 3.4* 3.5  CL 108 108 103  CO2 25 -- 27  GLUCOSE 140* 143* 173*  BUN 17 16 15   CREATININE 0.97 1.20 1.08  CALCIUM 9.0 -- 9.7   LFT  Basename 12/28/11 0409 12/27/11 2104  PROT 6.5 7.7  ALBUMIN 3.1* 3.8  AST 17 23  ALT 25 32  ALKPHOS 54 64  BILITOT 0.2* 0.2*  BILIDIR -- --  IBILI -- --   PT/INR  Basename 12/27/11 2156  INR 1.03    Assessment/Plan: 66 y.o. male with GI bleeding, likely lower, presumed diverticular  If he has clear red rebleeding today, then will prep him for colonoscopy.  Hb 9.6 yesterday PM.  Await todays CBC to decide on blood transfusion.  Stay on clears until after lunch, then can increase to regular if no recurrent red bleeding.   Rob Bunting, MD  12/30/2011, 6:39 AM [facility] Pager [phone]

## 2011-12-30 NOTE — Progress Notes (Signed)
No overt bleeding since last pm8pm.  Will advance diet.  Hopefully home tomorrow.

## 2011-12-31 LAB — TYPE AND SCREEN
Antibody Screen: NEGATIVE
Unit division: 0

## 2011-12-31 LAB — CBC
HCT: 25.6 % — ABNORMAL LOW (ref 39.0–52.0)
MCV: 83.9 fL (ref 78.0–100.0)
Platelets: 148 10*3/uL — ABNORMAL LOW (ref 150–400)
RBC: 3.05 MIL/uL — ABNORMAL LOW (ref 4.22–5.81)
WBC: 7.1 10*3/uL (ref 4.0–10.5)

## 2011-12-31 LAB — BASIC METABOLIC PANEL
CO2: 27 mEq/L (ref 19–32)
Chloride: 106 mEq/L (ref 96–112)
Creatinine, Ser: 1.01 mg/dL (ref 0.50–1.35)

## 2011-12-31 MED ORDER — FERROUS GLUCONATE 216 MG PO TABS: 216.0000 mg | ORAL_TABLET | Freq: Two times a day (BID) | ORAL | Status: DC

## 2011-12-31 MED ORDER — DOCUSATE SODIUM 100 MG PO CAPS: 100.0000 mg | ORAL_CAPSULE | Freq: Two times a day (BID) | ORAL | Status: AC

## 2011-12-31 NOTE — Progress Notes (Signed)
Subjective: No bm in about 36 hours.  Feels fine.   Objective: Vital signs in last 24 hours: Temp:  [97.8 F (36.6 C)-99 F (37.2 C)] 97.9 F (36.6 C) (02/03 0400) Pulse Rate:  [71-94] 77  (02/03 0446) Resp:  [16-21] 16  (02/03 0400) BP: (89-134)/(43-96) 107/59 mmHg (02/03 0446) SpO2:  [98 %-100 %] 99 % (02/03 0446) Weight:  [233 lb 11 oz (106 kg)] 233 lb 11 oz (106 kg) (02/03 0500) Last BM Date: 12/30/11 General: alert and oriented times 3 Heart: regular rate and rythm Abdomen: soft, non-tender, non-distended, normal bowel sounds   Lab Results:  Basename 12/31/11 0440 12/30/11 1700 12/30/11 0630 12/29/11 0330  WBC 7.1 -- 7.8 5.4  HGB 8.5* 9.9* 8.5* --  PLT 148* -- 158 161  MCV 83.9 -- 83.0 84.0   BMET  Basename 12/31/11 0440  NA 140  K 3.7  CL 106  CO2 27  GLUCOSE 105*  BUN 13  CREATININE 1.01  CALCIUM 8.6    Assessment/Plan: 66 y.o. male with resolved gi bleeding  This was lower, presumed diveticular and he needed only one unit of blood.  No overt bleeding in about 36 hours.  OK to d/c home today.  He should follow up as needed at [facility].    Rob Bunting, MD  12/31/2011, 7:20 AM [facility] Pager [phone]

## 2011-12-31 NOTE — Discharge Summary (Signed)
DISCHARGE SUMMARY  [name]  MR#: [id number]  DOB:01/03/1946  Date of Admission: 12/27/2011 Date of Discharge: 12/31/2011  Attending Physician:Joselyn Edling  Patient's PCP:No primary provider on file.  Consults- GI-Dr Rob Bunting  Presenting Complaint: Bleeding per rectum  Discharge Diagnoses: Principal Problem:  *GI BLEED- Lower GI, possibly diverticular Active Problems:  HYPERTENSION NEC  Diverticulosis of colon with hemorrhage    Discharge Medications: Medication List  As of 12/31/2011 11:03 AM   TAKE these medications         ANDROGEL 20.25 MG/1.25GM (1.62%) Gel   Generic drug: Testosterone   Place onto the skin daily.      atorvastatin 40 MG tablet   Commonly known as: LIPITOR   Take 40 mg by mouth daily.      docusate sodium 100 MG capsule   Commonly known as: COLACE   Take 1 capsule (100 mg total) by mouth 2 (two) times daily.      ferrous gluconate 216 MG tablet   Commonly known as: FERGON   Take 1 tablet (216 mg total) by mouth 2 (two) times daily with breakfast and lunch.      lisinopril 2.5 MG tablet   Commonly known as: PRINIVIL,ZESTRIL   Take 2.5 mg by mouth daily.      Tamsulosin HCl 0.4 MG Caps   Commonly known as: FLOMAX   Take 0.4 mg by mouth daily.               Hospital Course:-  This is a pleasant 66 y/o man who was admitted due to bright red blood per rectum. He had numerous bowel movements which were bloody and then stopped the following morning. However, that evening he began to bleed. Again, this stopped the following morning. At that time his diet was advanced and he has not had any further bleeding. Dr Christella Hartigan suspects that this was a Diverticular bleed. He had a similar bleed 2 yrs ago which was self remitting. He had a colonoscopy in 2007 and this was not repeated during this hospital stay.  He required one unit of blood. On discharge today his Hgb is 8.5, dropping from 14 on admission. I am discharging him on iron and would  like him to follow with is PCP in a few weeks and have repeat Hgb drawn.   His BP has been low and therefore we have held his Lisinopril. He is advised to restart it if his SBP goes above 130. He will be checking his BP at home.   He is advised to avoid aspirin and other NSAIDS and return to the hospital if bleeding recurs.     Day of Discharge Physical Exam: BP 120/77  Pulse 76  Temp(Src) 96.7 F (35.9 C) (Axillary)  Resp 18  Ht 6\' 3"  (1.905 m)  Wt 106 kg (233 lb 11 oz)  BMI 29.21 kg/m2  SpO2 100% General appearance: alert and cooperative  Throat: lips, mucosa, and tongue normal; teeth and gums normal  Lungs: clear to auscultation bilaterally  Heart: regular rate and rhythm, S1, S2 normal, no murmur, click, rub or gallop  Abdomen: soft, non-tender; bowel sounds normal; no masses, no organomegaly  Extremities: extremities normal, atraumatic, no cyanosis or edema   Results for orders placed during the hospital encounter of 12/27/11 (from the past 24 hour(s))  HEMOGLOBIN AND HEMATOCRIT, BLOOD     Status: Abnormal   Collection Time   12/30/11  5:00 PM      Component Value Range  Hemoglobin 9.9 (*) 13.0 - 17.0 (g/dL)   HCT 96.0 (*) 45.4 - 52.0 (%)  CBC     Status: Abnormal   Collection Time   12/31/11  4:40 AM      Component Value Range   WBC 7.1  4.0 - 10.5 (K/uL)   RBC 3.05 (*) 4.22 - 5.81 (MIL/uL)   Hemoglobin 8.5 (*) 13.0 - 17.0 (g/dL)   HCT 09.8 (*) 11.9 - 52.0 (%)   MCV 83.9  78.0 - 100.0 (fL)   MCH 27.9  26.0 - 34.0 (pg)   MCHC 33.2  30.0 - 36.0 (g/dL)   RDW 14.7 (*) 82.9 - 15.5 (%)   Platelets 148 (*) 150 - 400 (K/uL)  BASIC METABOLIC PANEL     Status: Abnormal   Collection Time   12/31/11  4:40 AM      Component Value Range   Sodium 140  135 - 145 (mEq/L)   Potassium 3.7  3.5 - 5.1 (mEq/L)   Chloride 106  96 - 112 (mEq/L)   CO2 27  19 - 32 (mEq/L)   Glucose, Bld 105 (*) 70 - 99 (mg/dL)   BUN 13  6 - 23 (mg/dL)   Creatinine, Ser 5.62  0.50 - 1.35 (mg/dL)    Calcium 8.6  8.4 - 10.5 (mg/dL)   GFR calc non Af Amer 77 (*) >90 (mL/min)   GFR calc Af Amer 89 (*) >90 (mL/min)    Disposition: stable for discharge   Follow-up Appts: Discharge Orders    Future Orders Please Complete By Expires   Diet - low sodium heart healthy      Increase activity slowly      Discharge instructions      Comments:   Avoid Aspirin, Motrin, Ibuprofen and Advil and Naprosyn.      Follow-up with Dr. [name] in 2-3 weeks.  Time on Discharge: >45 min  Signed: Raymon Schlarb 12/31/2011, 11:03 AM

## 2012-09-03 ENCOUNTER — Emergency Department (HOSPITAL_COMMUNITY)
Admission: EM | Admit: 2012-09-03 | Discharge: 2012-09-03 | Disposition: A | Discharge status: Home / Self Care | Payer: BC Managed Care – PPO | Source: Home / Self Care

## 2012-09-03 ENCOUNTER — Encounter (HOSPITAL_COMMUNITY): Payer: Self-pay | Admitting: Emergency Medicine

## 2012-09-03 DIAGNOSIS — K439 Ventral hernia without obstruction or gangrene: Secondary | ICD-10-CM

## 2012-09-03 NOTE — ED Notes (Signed)
Reports he has noticed a knot in abdomen, above navel.  Noticed this approx 2 weeks ago.  Patient reports knot is more palpable than visible.  Patient feels knot is larger today.  Patient reports knot is tender today after bm, reports he did strain for this bm.  Denies nausea, denies vomiting.  Soreness increases with movement - standing.  No pain right now sitting still.  If touched, patient does have pain.

## 2012-09-03 NOTE — ED Provider Notes (Signed)
Medical screening examination/treatment/procedure(s) were performed by resident physician or non-physician practitioner and as supervising physician I was immediately available for consultation/collaboration.   Barkley Bruns MD.    Linna Hoff, MD 09/03/12 [phone]

## 2012-09-03 NOTE — ED Provider Notes (Signed)
History     CSN: [id number]  Arrival date & time 09/03/12  1613   None     Chief Complaint  Patient presents with  . Abdominal Pain    (Consider location/radiation/quality/duration/timing/severity/associated sxs/prior treatment) Patient is a 66 y.o. male presenting with abdominal pain. The history is provided by the patient. No language interpreter was used.  Abdominal Pain The primary symptoms of the illness include abdominal pain. Episode onset: more than 2 weeks. The onset of the illness was gradual. The problem has been gradually worsening.  The patient states that she believes she is currently not pregnant.    Past Medical History  Diagnosis Date  . Inflammatory polyps of colon   . Pneumothorax 2009    spontaneous, treated with chest tube.  Dr Lowella Fairy.  Marland Kitchen Hx of adenomatous colonic polyps 2007  . Hemorrhage of colon following colonoscopy 2007    post polypectomy bleed 2007, treated with endo clip and epi injection.  . Acute blood loss anemia 2007, 2010    from GI bleeds, required 2 PRBCs each episode.   Lona Kettle)     Past Surgical History  Procedure Date  . Tonsillectomy   . Colon polyps     Family History  Problem Relation Age of Onset  . Heart disease Mother   . Diabetes Mother   . Hypertension Mother   . Cancer Father     History  Substance Use Topics  . Smoking status: Never Smoker   . Smokeless tobacco: Current User    Types: Chew  . Alcohol Use: Yes     occasionaly      Review of Systems  Gastrointestinal: Positive for abdominal pain.  All other systems reviewed and are negative.    Allergies  Penicillins  Home Medications   Current Outpatient Rx  Name Route Sig Dispense Refill  . ATORVASTATIN CALCIUM 40 MG PO TABS Oral Take 40 mg by mouth daily.    Marland Kitchen FERROUS GLUCONATE 216 MG PO TABS Oral Take 1 tablet (216 mg total) by mouth 2 (two) times daily with breakfast and lunch. 60 tablet 0  . LISINOPRIL 2.5 MG PO TABS Oral Take 2.5 mg  by mouth daily.    Marland Kitchen TAMSULOSIN HCL 0.4 MG PO CAPS Oral Take 0.4 mg by mouth daily.    . TESTOSTERONE 20.25 MG/1.25GM (1.62%) TD GEL Transdermal Place onto the skin daily.      BP 144/97  Pulse 62  Temp 98.5 F (36.9 C) (Oral)  Resp 18  SpO2 98%  Physical Exam  Vitals reviewed. Constitutional: He appears well-developed and well-nourished.  Neck: Normal range of motion.  Pulmonary/Chest: Effort normal.  Abdominal: Soft. There is tenderness.       Palpable defect abdominal wall when lying down,  Pt has protruding hernia with sitting up   Area reduces with lying down  Musculoskeletal: Normal range of motion.  Neurological: He is alert.  Skin: Skin is warm.    ED Course  Procedures (including critical care time)  Labs Reviewed - No data to display No results found.   No diagnosis found.    MDM  I spoke to Dr. Janee Morn, surgery on call.  Pt advised to call office tomorrow morning to be seen for evaluation tomorrow.  Pt advised if area swells and does not go away with lying down to go to the [facility].        Lonia Skinner [place], [place] 09/03/12 [phone]

## 2012-09-04 ENCOUNTER — Ambulatory Visit (INDEPENDENT_AMBULATORY_CARE_PROVIDER_SITE_OTHER): Payer: BC Managed Care – PPO | Admitting: General Surgery

## 2012-09-04 ENCOUNTER — Encounter (INDEPENDENT_AMBULATORY_CARE_PROVIDER_SITE_OTHER): Payer: Self-pay | Admitting: General Surgery

## 2012-09-04 VITALS — BP 132/82 | HR 54 | Temp 99.5°F | Resp 18 | Ht 74.0 in | Wt 238.8 lb

## 2012-09-04 DIAGNOSIS — K429 Umbilical hernia without obstruction or gangrene: Secondary | ICD-10-CM

## 2012-09-04 NOTE — Progress Notes (Signed)
Patient ID: [name], male   DOB: 06/03/1946, 67 y.o.   MRN: [id number]  Chief Complaint  Patient presents with  . New Evaluation    Herina    HPI [name] is a 66 y.o. male seen yesterday in the urgent care center for a umbilical hernia which was incarcerated. Patient had the hernia itself reduced overnight and had no further abdominal pain. Patient states that over the last several months he recognizes he had a hernia and has had no pain until yesterday. Patient was denies any fevers chills nausea vomiting while at home. HPI  Past Medical History  Diagnosis Date  . Inflammatory polyps of colon   . Pneumothorax 2009    spontaneous, treated with chest tube.  Dr Lowella Fairy.  Marland Kitchen Hx of adenomatous colonic polyps 2007  . Hemorrhage of colon following colonoscopy 2007    post polypectomy bleed 2007, treated with endo clip and epi injection.  . Acute blood loss anemia 2007, 2010    from GI bleeds, required 2 PRBCs each episode.   Lona Kettle)     Past Surgical History  Procedure Date  . Tonsillectomy   . Colon polyps   . Small intestine surgery     diverticulotous    Family History  Problem Relation Age of Onset  . Heart disease Mother   . Diabetes Mother   . Hypertension Mother   . Cancer Father     Social History History  Substance Use Topics  . Smoking status: Never Smoker   . Smokeless tobacco: Current User    Types: Chew  . Alcohol Use: Yes     occasionaly    Allergies  Allergen Reactions  . Penicillins Itching and Rash    Current Outpatient Prescriptions  Medication Sig Dispense Refill  . atorvastatin (LIPITOR) 40 MG tablet Take 40 mg by mouth daily.      Marland Kitchen lisinopril (PRINIVIL,ZESTRIL) 2.5 MG tablet Take 2.5 mg by mouth daily.      . Tamsulosin HCl (FLOMAX) 0.4 MG CAPS Take 0.4 mg by mouth daily.      . Testosterone (ANDROGEL) 20.25 MG/1.25GM (1.62%) GEL Place onto the skin daily.      . ferrous gluconate (FERGON) 216 MG tablet Take 1 tablet (216  mg total) by mouth 2 (two) times daily with breakfast and lunch.  60 tablet  0    Review of Systems Review of Systems  Constitutional: Negative.   HENT: Negative.   Eyes: Negative.   Respiratory: Negative.   Cardiovascular: Negative.   Gastrointestinal: Positive for abdominal pain.  Musculoskeletal: Negative.     Blood pressure 132/82, pulse 54, temperature 99.5 F (37.5 C), temperature source Oral, resp. rate 18, height 6\' 2"  (1.88 m), weight 238 lb 12.8 oz (108.319 kg).  Physical Exam Physical Exam  Constitutional: He is oriented to person, place, and time. He appears well-developed and well-nourished.  HENT:  Head: Normocephalic and atraumatic.  Eyes: Conjunctivae normal and EOM are normal. Pupils are equal, round, and reactive to light.  Neck: Normal range of motion. Neck supple.  Cardiovascular: Normal rate, regular rhythm and normal heart sounds.   Pulmonary/Chest: Effort normal and breath sounds normal.  Abdominal: Soft. Bowel sounds are normal. A hernia is present. Hernia confirmed positive in the ventral area.         Small half centimeter defect left of the midline  Musculoskeletal: Normal range of motion.  Neurological: He is alert and oriented to person, place, and time.  Assessment    The patient is 80 -year-old male now with a ventral hernia. Patient had an episode of incarceration which was self reduced.    Plan    1.  Proceed with laparoscopic umbilical hernia . 2. All risks and benefits were discussed with the patient, to generally include infection, bleeding, damage to surrounding structures, and recurrence. Alternatives were offered and described.  All questions were answered and the patient voiced understanding of the procedure and wishes to proceed at this point.        Marigene Ehlers., Sandip Power 09/04/2012, 2:50 PM

## 2012-09-10 ENCOUNTER — Ambulatory Visit (INDEPENDENT_AMBULATORY_CARE_PROVIDER_SITE_OTHER): Payer: Self-pay | Admitting: General Surgery

## 2012-09-30 DIAGNOSIS — K429 Umbilical hernia without obstruction or gangrene: Secondary | ICD-10-CM

## 2012-10-09 ENCOUNTER — Telehealth (INDEPENDENT_AMBULATORY_CARE_PROVIDER_SITE_OTHER): Payer: Self-pay | Admitting: General Surgery

## 2012-10-09 NOTE — Telephone Encounter (Signed)
Patient called in and explained that he had surgery on 09/30/12 for hernia repair.  He explains that he has not had a bowel movement since surgery.  He said that he has tried stool softeners and just switched to MiraLax today.  I explained that he can double up on the MiraLax if his doesn't seem to work and that he can titrate this.  He said he understood and would do this and would call back if he has any other problems.

## 2012-10-15 ENCOUNTER — Encounter (INDEPENDENT_AMBULATORY_CARE_PROVIDER_SITE_OTHER): Payer: Self-pay | Admitting: General Surgery

## 2012-10-15 ENCOUNTER — Ambulatory Visit (INDEPENDENT_AMBULATORY_CARE_PROVIDER_SITE_OTHER): Payer: BC Managed Care – PPO | Admitting: General Surgery

## 2012-10-15 VITALS — BP 136/84 | HR 78 | Temp 98.0°F | Resp 18 | Ht 74.0 in | Wt 235.4 lb

## 2012-10-15 DIAGNOSIS — Z8719 Personal history of other diseases of the digestive system: Secondary | ICD-10-CM

## 2012-10-15 DIAGNOSIS — Z9889 Other specified postprocedural states: Secondary | ICD-10-CM

## 2012-10-15 NOTE — Progress Notes (Signed)
Patient ID: [name], male   DOB: 06/15/1946, 66 y.o.   MRN: [id number] The patient 66 year old status post laparoscopic hernia. Mesh. This was 09/30/2013. Patient has been doing well postoperatively. He is complaining of some issue pain bilaterally. Otherwise no nausea or vomiting tolerating diet  On exam:  Was clean dry and intact. The umbilical defect which is fluid-filled no signs of infection, no signs of recurrent hernia  Assessment and plan:  Patient in follow up when necessary  Patient okay for normal activity 2 weeks

## 2013-08-09 ENCOUNTER — Encounter (HOSPITAL_COMMUNITY): Payer: Self-pay | Admitting: *Deleted

## 2013-08-09 ENCOUNTER — Inpatient Hospital Stay (HOSPITAL_COMMUNITY)
Admission: EM | Admit: 2013-08-09 | Discharge: 2013-08-12 | DRG: 379 | Disposition: A | Discharge status: Home / Self Care | Payer: Medicare PPO | Attending: Family Medicine | Admitting: Family Medicine

## 2013-08-09 DIAGNOSIS — I10 Essential (primary) hypertension: Secondary | ICD-10-CM

## 2013-08-09 DIAGNOSIS — D126 Benign neoplasm of colon, unspecified: Secondary | ICD-10-CM

## 2013-08-09 DIAGNOSIS — F17200 Nicotine dependence, unspecified, uncomplicated: Secondary | ICD-10-CM | POA: Diagnosis present

## 2013-08-09 DIAGNOSIS — IMO0002 Reserved for concepts with insufficient information to code with codable children: Secondary | ICD-10-CM

## 2013-08-09 DIAGNOSIS — K922 Gastrointestinal hemorrhage, unspecified: Secondary | ICD-10-CM

## 2013-08-09 DIAGNOSIS — K5731 Diverticulosis of large intestine without perforation or abscess with bleeding: Principal | ICD-10-CM

## 2013-08-09 DIAGNOSIS — Z880 Allergy status to penicillin: Secondary | ICD-10-CM

## 2013-08-09 DIAGNOSIS — E785 Hyperlipidemia, unspecified: Secondary | ICD-10-CM | POA: Diagnosis present

## 2013-08-09 DIAGNOSIS — K648 Other hemorrhoids: Secondary | ICD-10-CM

## 2013-08-09 DIAGNOSIS — E876 Hypokalemia: Secondary | ICD-10-CM | POA: Diagnosis present

## 2013-08-09 DIAGNOSIS — D62 Acute posthemorrhagic anemia: Secondary | ICD-10-CM

## 2013-08-09 LAB — BASIC METABOLIC PANEL WITH GFR
BUN: 10 mg/dL (ref 6–23)
CO2: 27 meq/L (ref 19–32)
Calcium: 9.2 mg/dL (ref 8.4–10.5)
Chloride: 99 meq/L (ref 96–112)
Creatinine, Ser: 0.99 mg/dL (ref 0.50–1.35)

## 2013-08-09 LAB — CBC WITH DIFFERENTIAL/PLATELET
Basophils Absolute: 0 K/uL (ref 0.0–0.1)
Basophils Relative: 0 % (ref 0–1)
Eosinophils Absolute: 0.2 10*3/uL (ref 0.0–0.7)
Eosinophils Relative: 3 % (ref 0–5)
HCT: 43 % (ref 39.0–52.0)
Hemoglobin: 14.2 g/dL (ref 13.0–17.0)
Lymphocytes Relative: 31 % (ref 12–46)
Lymphs Abs: 1.9 10*3/uL (ref 0.7–4.0)
MCH: 27.9 pg (ref 26.0–34.0)
MCHC: 33 g/dL (ref 30.0–36.0)
MCV: 84.5 fL (ref 78.0–100.0)
Monocytes Absolute: 0.6 K/uL (ref 0.1–1.0)
Monocytes Relative: 10 % (ref 3–12)
Neutro Abs: 3.4 K/uL (ref 1.7–7.7)
Neutrophils Relative %: 56 % (ref 43–77)
Platelets: 181 10*3/uL (ref 150–400)
RBC: 5.09 MIL/uL (ref 4.22–5.81)
RDW: 15.4 % (ref 11.5–15.5)
WBC: 6.1 K/uL (ref 4.0–10.5)

## 2013-08-09 LAB — CBC
HCT: 40.7 % (ref 39.0–52.0)
Hemoglobin: 13.4 g/dL (ref 13.0–17.0)
MCHC: 32.9 g/dL (ref 30.0–36.0)
RDW: 15.6 % — ABNORMAL HIGH (ref 11.5–15.5)
WBC: 6.1 10*3/uL (ref 4.0–10.5)

## 2013-08-09 LAB — TYPE AND SCREEN
ABO/RH(D): A POS
Antibody Screen: NEGATIVE

## 2013-08-09 LAB — OCCULT BLOOD, POC DEVICE: Fecal Occult Bld: POSITIVE — AB

## 2013-08-09 LAB — PROTIME-INR
INR: 1.01 (ref 0.00–1.49)
Prothrombin Time: 13.1 seconds (ref 11.6–15.2)

## 2013-08-09 LAB — BASIC METABOLIC PANEL
GFR calc Af Amer: >90 mL/min (ref >90)
GFR calc non Af Amer: 84 mL/min — ABNORMAL LOW (ref >90)
Glucose, Bld: 92 mg/dL (ref 70–99)
Potassium: 3.3 mEq/L — ABNORMAL LOW (ref 3.5–5.1)
Sodium: 134 mEq/L — ABNORMAL LOW (ref 135–145)

## 2013-08-09 LAB — APTT: aPTT: 29 seconds (ref 24–37)

## 2013-08-09 MED ORDER — HYDRALAZINE HCL 20 MG/ML IJ SOLN: 10.0000 mg | INTRAMUSCULAR | Status: DC | PRN

## 2013-08-09 MED ORDER — SODIUM CHLORIDE 0.9 % IJ SOLN
3.0000 mL | Freq: Two times a day (BID) | INTRAMUSCULAR | Status: DC
  Administered 2013-08-10 – 2013-08-11 (×3): 3 mL via INTRAVENOUS

## 2013-08-09 MED ORDER — PANTOPRAZOLE SODIUM 40 MG IV SOLR
40.0000 mg | Freq: Once | INTRAVENOUS | Status: AC
  Administered 2013-08-09: 40 mg via INTRAVENOUS
  Filled 2013-08-09: qty 40

## 2013-08-09 MED ORDER — ACETAMINOPHEN 650 MG RE SUPP: 650.0000 mg | Freq: Four times a day (QID) | RECTAL | Status: DC | PRN

## 2013-08-09 MED ORDER — TAMSULOSIN HCL 0.4 MG PO CAPS
0.4000 mg | ORAL_CAPSULE | Freq: Every day | ORAL | Status: DC
  Administered 2013-08-10 – 2013-08-11 (×2): 0.4 mg via ORAL
  Filled 2013-08-09 (×4): qty 1

## 2013-08-09 MED ORDER — SODIUM CHLORIDE 0.9 % IV SOLN: Freq: Once | INTRAVENOUS | Status: DC

## 2013-08-09 MED ORDER — ONDANSETRON HCL 4 MG/2ML IJ SOLN: 4.0000 mg | Freq: Four times a day (QID) | INTRAMUSCULAR | Status: DC | PRN

## 2013-08-09 MED ORDER — SODIUM CHLORIDE 0.9 % IV SOLN
INTRAVENOUS | Status: AC
  Administered 2013-08-10: 1 mL via INTRAVENOUS
  Administered 2013-08-10: 17:00:00 via INTRAVENOUS

## 2013-08-09 MED ORDER — ATORVASTATIN CALCIUM 40 MG PO TABS
40.0000 mg | ORAL_TABLET | Freq: Every day | ORAL | Status: DC
  Administered 2013-08-10 – 2013-08-11 (×2): 40 mg via ORAL
  Filled 2013-08-09 (×3): qty 1

## 2013-08-09 MED ORDER — POTASSIUM CHLORIDE IN NACL 20-0.9 MEQ/L-% IV SOLN
Freq: Once | INTRAVENOUS | Status: AC
  Administered 2013-08-09: 20:00:00 via INTRAVENOUS
  Filled 2013-08-09 (×2): qty 1000

## 2013-08-09 MED ORDER — ONDANSETRON HCL 4 MG PO TABS: 4.0000 mg | ORAL_TABLET | Freq: Four times a day (QID) | ORAL | Status: DC | PRN

## 2013-08-09 MED ORDER — DARIFENACIN HYDROBROMIDE ER 7.5 MG PO TB24
7.5000 mg | ORAL_TABLET | Freq: Every day | ORAL | Status: DC
  Administered 2013-08-10 – 2013-08-11 (×2): 7.5 mg via ORAL
  Filled 2013-08-09 (×3): qty 1

## 2013-08-09 MED ORDER — ACETAMINOPHEN 325 MG PO TABS: 650.0000 mg | ORAL_TABLET | Freq: Four times a day (QID) | ORAL | Status: DC | PRN

## 2013-08-09 NOTE — H&P (Signed)
[facility] History and Physical  [name] ZOX:[id number] DOB: [birth year] DOA: 08/09/2013  Referring physician: ER physician. PCP: Sissy Hoff, MD  Dr. Christella Hartigan. Gastroenterologist.  Chief Complaint: Rectal bleeding.  HPI: [name] is a 67 y.o. male with history of hypertension hyperlipidemia and previous history of rectal bleeding and has had colonoscopy in 2010 which showed descending and sigmoid colon diverticula presents with complaints of having frank rectal bleeding this evening. Patient denies any fever chills abdominal pain. Has been having some constipation last 2 days and had a bowel movement after which patient had frank rectal bleed. Patient had a repeat episode in the ER. Patient had mild dizziness but was hemodynamically stable and has been admitted for further management. Denies any chest pain or shortness of breath.  Review of Systems: As presented in the history of presenting illness, rest negative.  Past Medical History  Diagnosis Date  . Inflammatory polyps of colon   . Pneumothorax 2009    spontaneous, treated with chest tube.  Dr Lowella Fairy.  Marland Kitchen Hx of adenomatous colonic polyps 2007  . Hemorrhage of colon following colonoscopy 2007    post polypectomy bleed 2007, treated with endo clip and epi injection.  . Acute blood loss anemia 2007, 2010    from GI bleeds, required 2 PRBCs each episode.   Marland Kitchen Glaucoma    Past Surgical History  Procedure Laterality Date  . Tonsillectomy    . Colon polyps    . Small intestine surgery      diverticulotous   Social History:  reports that he has never smoked. His smokeless tobacco use includes Chew. He reports that  drinks alcohol. He reports that he does not use illicit drugs. Home. where does patient live-- Can do ADLs. Can patient participate in ADLs?  Allergies  Allergen Reactions  . Penicillins Itching and Rash    Family History  Problem Relation Age of Onset  . Heart disease Mother   . Diabetes  Mother   . Hypertension Mother   . Cancer Father       Prior to Admission medications   Medication Sig Start Date End Date Taking? Authorizing Provider  atorvastatin (LIPITOR) 40 MG tablet Take 40 mg by mouth daily.   Yes Historical Provider, MD  lisinopril-hydrochlorothiazide (PRINZIDE,ZESTORETIC) 20-12.5 MG per tablet  10/09/12  Yes Historical Provider, MD  tadalafil (CIALIS) 5 MG tablet Take 5 mg by mouth daily.   Yes Historical Provider, MD  Tamsulosin HCl (FLOMAX) 0.4 MG CAPS Take 0.4 mg by mouth daily.   Yes Historical Provider, MD  Testosterone (ANDROGEL) 20.25 MG/1.25GM (1.62%) GEL Place onto the skin daily.   Yes Historical Provider, MD  VESICARE 5 MG tablet Take 5 mg by mouth daily.  10/11/12  Yes Historical Provider, MD   Physical Exam: Filed Vitals:   08/09/13 1721 08/09/13 1827 08/09/13 1829 08/09/13 1830  BP: 155/90 148/89 146/92 146/94  Pulse:  57 66 74  Temp: 98.7 F (37.1 C)     TempSrc: Oral     Resp: 16     SpO2: 99%        General:  Well-developed well-nourished.  Eyes: Anicteric no pallor.  ENT: No discharge from ears eyes nose mouth.  Neck: No mass felt.  Cardiovascular: S1-S2 heard.  Respiratory: No rhonchi or crepitations.  Abdomen: Soft nontender bowel sounds present.  Skin: No rash.  Musculoskeletal: No edema.  Psychiatric: Appears normal.  Neurologic: Alert awake oriented to time place and person. Moves all extremities.  Labs on Admission:  Basic Metabolic Panel:  Recent Labs Lab 08/09/13 1808  NA 134*  K 3.3*  CL 99  CO2 27  GLUCOSE 92  BUN 10  CREATININE 0.99  CALCIUM 9.2   Liver Function Tests: No results found for this basename: AST, ALT, ALKPHOS, BILITOT, PROT, ALBUMIN,  in the last 168 hours No results found for this basename: LIPASE, AMYLASE,  in the last 168 hours No results found for this basename: AMMONIA,  in the last 168 hours CBC:  Recent Labs Lab 08/09/13 1808  WBC 6.1  NEUTROABS 3.4  HGB 14.2  HCT  43.0  MCV 84.5  PLT 181   Cardiac Enzymes: No results found for this basename: CKTOTAL, CKMB, CKMBINDEX, TROPONINI,  in the last 168 hours  BNP (last 3 results) No results found for this basename: PROBNP,  in the last 8760 hours CBG: No results found for this basename: GLUCAP,  in the last 168 hours  Radiological Exams on Admission: No results found.   Assessment/Plan Principal Problem:   GI BLEED Active Problems:   HYPERTENSION NEC   1. Rectal bleed - history of diverticula colonoscopy done in 2010 most likely source. Patient denies having taken any NSAIDs or aspirin. At this time patient is hemodynamically stable. Follow CBC. 2. History of hypertension - hold antihypertensives for now as patient had mild dizziness. When necessary IV hydralazine for systolic blood pressure more than 180. 3. Hyperlipidemia - continue present medications.    Code Status: Full code.  Family Communication: Patient's wife at the bedside.  Disposition Plan: Admit to inpatient.    Sherrel Shafer N. [facility] Pager [phone].  If 7PM-7AM, please contact night-coverage [email or web address] Password [facility] 08/09/2013, 8:17 PM

## 2013-08-09 NOTE — Progress Notes (Signed)
Pt arrived to unit via stretcher. Pt placed on telemetry, box #14 (NSR). Vitals stable. Pt A/O x4. Oriented to unit, floor, and staff. All orders acknowledged and implemented. Observed 1 stool, frank blood noted, MD aware.

## 2013-08-09 NOTE — ED Provider Notes (Signed)
CSN: [id number]     Arrival date & time 08/09/13  1711 History   First MD Initiated Contact with Patient 08/09/13 1728     Chief Complaint  Patient presents with  . Rectal Bleeding   (Consider location/radiation/quality/duration/timing/severity/associated sxs/prior Treatment) HPI Comments: Pt last had a blood transfusion for anemia about 2 years ago.  Pt has had lower GI bleeding in the past, thought to be diverticular according to family.  Pt had a few firm stools recently, but not frankly constipated.    Patient is a 67 y.o. male presenting with hematochezia. The history is provided by the patient, the spouse and medical records.  Rectal Bleeding Quality:  Bright red Amount:  Copious Timing:  Constant Chronicity:  Recurrent Context: defecation   Context: not constipation and not rectal pain   Similar prior episodes: yes   Relieved by:  Nothing Worsened by:  Nothing tried Ineffective treatments:  None tried Associated symptoms: light-headedness   Associated symptoms: no abdominal pain, no dizziness, no fever, no hematemesis, no loss of consciousness, no recent illness and no vomiting   Risk factors: no anticoagulant use, no hx of colorectal cancer, no hx of colorectal surgery and no NSAID use     Past Medical History  Diagnosis Date  . Inflammatory polyps of colon   . Pneumothorax 2009    spontaneous, treated with chest tube.  Dr Lowella Fairy.  Marland Kitchen Hx of adenomatous colonic polyps 2007  . Hemorrhage of colon following colonoscopy 2007    post polypectomy bleed 2007, treated with endo clip and epi injection.  . Acute blood loss anemia 2007, 2010    from GI bleeds, required 2 PRBCs each episode.   Marland Kitchen Glaucoma    Past Surgical History  Procedure Laterality Date  . Tonsillectomy    . Colon polyps    . Small intestine surgery      diverticulotous   Family History  Problem Relation Age of Onset  . Heart disease Mother   . Diabetes Mother   . Hypertension Mother   . Cancer Father     History  Substance Use Topics  . Smoking status: Never Smoker   . Smokeless tobacco: Current User    Types: Chew     Comment: chews tobacco for 60 yrs( grandpa gave it to him)  . Alcohol Use: Yes     Comment: occasionaly    Review of Systems  Constitutional: Negative for fever.  Respiratory: Negative for shortness of breath.   Cardiovascular: Negative for chest pain.  Gastrointestinal: Positive for blood in stool and hematochezia. Negative for vomiting, abdominal pain and hematemesis.  Musculoskeletal: Negative for back pain.  Skin: Negative for color change.  Neurological: Positive for light-headedness. Negative for dizziness, loss of consciousness and syncope.  All other systems reviewed and are negative.    Allergies  Penicillins  Home Medications   Current Outpatient Rx  Name  Route  Sig  Dispense  Refill  . atorvastatin (LIPITOR) 40 MG tablet   Oral   Take 40 mg by mouth daily.         Marland Kitchen lisinopril-hydrochlorothiazide (PRINZIDE,ZESTORETIC) 20-12.5 MG per tablet               . tadalafil (CIALIS) 5 MG tablet   Oral   Take 5 mg by mouth daily.         . Tamsulosin HCl (FLOMAX) 0.4 MG CAPS   Oral   Take 0.4 mg by mouth daily.         Marland Kitchen  Testosterone (ANDROGEL) 20.25 MG/1.25GM (1.62%) GEL   Transdermal   Place onto the skin daily.         . VESICARE 5 MG tablet   Oral   Take 5 mg by mouth daily.           BP 146/94  Pulse 74  Temp(Src) 98.7 F (37.1 C) (Oral)  Resp 16  SpO2 99% Physical Exam  Nursing note and vitals reviewed. Constitutional: He is oriented to person, place, and time. He appears well-developed and well-nourished. No distress.  HENT:  Head: Normocephalic and atraumatic.  Eyes: Conjunctivae and EOM are normal. No scleral icterus.  Neck: Neck supple.  Cardiovascular: Normal rate, regular rhythm and intact distal pulses.   Pulmonary/Chest: Effort normal and breath sounds normal.  Abdominal: Soft. Normal appearance. He  exhibits no distension. Bowel sounds are increased. There is no tenderness. There is no rebound and no guarding.  Genitourinary: Rectal exam shows no tenderness and anal tone normal. Guaiac positive stool. Prostate is not tender.  Chaperone present  Neurological: He is alert and oriented to person, place, and time. He exhibits normal muscle tone. Coordination normal.  Skin: Skin is warm and dry. He is not diaphoretic.  Psychiatric: He has a normal mood and affect.    ED Course  Procedures (including critical care time) Labs Review Labs Reviewed  BASIC METABOLIC PANEL - Abnormal; Notable for the following:    Sodium 134 (*)    Potassium 3.3 (*)    GFR calc non Af Amer 84 (*)    All other components within normal limits  OCCULT BLOOD, POC DEVICE - Abnormal; Notable for the following:    Fecal Occult Bld POSITIVE (*)    All other components within normal limits  CBC WITH DIFFERENTIAL  APTT  PROTIME-INR  TYPE AND SCREEN   Imaging Review No results found.  ra sat is 99% and I interpret to be adequate  6:17 PM Pt appears well, has h/o hemorrhage and hypotension and requiring transfusion.  Given this history, would recommend admission for obs.  Will aplce 2 IV's, send type and screen and monitor closely awaiting lab test results.  Heme + guaiac.  Stool was reddish in color.    7:16 PM Spoke to hosptialist who agrees to admit.  IVF's with K+ has been ordered for mild hypokalemia MDM   1. Lower GI bleed     Pt with apparent h/o prior lower GI bleeding.  Pt with large amount of bright red blood tonight with BM.  By history, worrisome for diverticular bleed.  Pt likely will need admission and monitoring, serial H&H's and possibly GI consultation if bleeding seems to be continuing.  VS are normal currently.   Not tachycardic.      Gavin Pound. Oletta Lamas, MD 08/09/13 1610

## 2013-08-09 NOTE — ED Notes (Signed)
Reports onset 45 mins ago of dark red rectal bleeding, moderate amount with clots, reports hx of same. Also reports loss of appetite one month ago. No acute distress noted at triage.

## 2013-08-10 DIAGNOSIS — E785 Hyperlipidemia, unspecified: Secondary | ICD-10-CM

## 2013-08-10 LAB — BASIC METABOLIC PANEL
BUN: 10 mg/dL (ref 6–23)
CO2: 26 mEq/L (ref 19–32)
Chloride: 103 mEq/L (ref 96–112)
Creatinine, Ser: 1.05 mg/dL (ref 0.50–1.35)
Glucose, Bld: 96 mg/dL (ref 70–99)
Potassium: 3.5 mEq/L (ref 3.5–5.1)

## 2013-08-10 LAB — CBC
HCT: 37.1 % — ABNORMAL LOW (ref 39.0–52.0)
Hemoglobin: 12.3 g/dL — ABNORMAL LOW (ref 13.0–17.0)
Hemoglobin: 12.8 g/dL — ABNORMAL LOW (ref 13.0–17.0)
MCV: 84.3 fL (ref 78.0–100.0)
Platelets: 160 10*3/uL (ref 150–400)
RBC: 4.61 MIL/uL (ref 4.22–5.81)
WBC: 5.2 10*3/uL (ref 4.0–10.5)
WBC: 6.5 10*3/uL (ref 4.0–10.5)

## 2013-08-10 NOTE — Progress Notes (Signed)
[facility] PROGRESS NOTE  [name] VHQ:[id number] DOB: Aug 26, 1946 DOA: 08/09/2013 PCP: Sissy Hoff, MD  Assessment/Plan: 1. Rectal bleed - given history of colonoscopy done in 2010 and diverticula reported on that exam, I agree at this point that diverticular source for GI bleed most likely.  As such will continue to monitor patient and provide supportive therapy.  Transfuse if hgb less than 7.9.  2. HTN - Continue prn hydralazine for elevated BP's. 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 Disposition Plan: Pending cessation of GI bleed and improvement in condition.    Consultants:  None currently  Procedures:  none  Antibiotics:  none   HPI/Subjective: Patient denies any diarrhea, abdominal discomfort, nausea. No new complaints  Objective: Filed Vitals:   08/10/13 1356  BP: 138/85  Pulse: 69  Temp: 98.9 F (37.2 C)  Resp: 18    Intake/Output Summary (Last 24 hours) at 08/10/13 1914 Last data filed at 08/10/13 1300  Gross per 24 hour  Intake    480 ml  Output      0 ml  Net    480 ml   Filed Weights   08/09/13 2030  Weight: 100.381 kg (221 lb 4.8 oz)    Exam:   General:  Pt in NAD, Alert and Awake  Cardiovascular: RRR, no MRG  Respiratory: CTA BL, no wheeze  Abdomen: soft, ND  Musculoskeletal: no cyanosis or clubbing.   Data Reviewed: Basic Metabolic Panel:  Recent Labs Lab 08/09/13 1808 08/10/13 0650  NA 134* 136  K 3.3* 3.5  CL 99 103  CO2 27 26  GLUCOSE 92 96  BUN 10 10  CREATININE 0.99 1.05  CALCIUM 9.2 8.4   Liver Function Tests: No results found for this basename: AST, ALT, ALKPHOS, BILITOT, PROT, ALBUMIN,  in the last 168 hours No results found for this basename: LIPASE, AMYLASE,  in the last 168 hours No results found for this basename: AMMONIA,  in the last 168  hours CBC:  Recent Labs Lab 08/09/13 1808 08/09/13 2130 08/10/13 0020 08/10/13 0620  WBC 6.1 6.1 6.5 5.2  NEUTROABS 3.4  --   --   --   HGB 14.2 13.4 12.8* 12.3*  HCT 43.0 40.7 38.8* 37.1*  MCV 84.5 84.3 84.2 84.3  PLT 181 184 160 160   Cardiac Enzymes: No results found for this basename: CKTOTAL, CKMB, CKMBINDEX, TROPONINI,  in the last 168 hours BNP (last 3 results) No results found for this basename: PROBNP,  in the last 8760 hours CBG: No results found for this basename: GLUCAP,  in the last 168 hours  No results found for this or any previous visit (from the past 240 hour(s)).   Studies: No results found.  Scheduled Meds: . atorvastatin  40 mg Oral Daily  . darifenacin  7.5 mg Oral Daily  . sodium chloride  3 mL Intravenous Q12H  . tamsulosin  0.4 mg Oral Daily   Continuous Infusions: . sodium chloride 100 mL/hr at 08/10/13 1632    Principal Problem:   GI BLEED Active Problems:   HYPERTENSION NEC    Time spent: > 35 minutes    Penny Pia  [facility] Pager [phone]. If 7PM-7AM, please contact night-coverage at [email or web address], password [facility] 08/10/2013, 7:14 PM  LOS: 1 day

## 2013-08-11 DIAGNOSIS — I10 Essential (primary) hypertension: Secondary | ICD-10-CM

## 2013-08-11 DIAGNOSIS — D62 Acute posthemorrhagic anemia: Secondary | ICD-10-CM

## 2013-08-11 DIAGNOSIS — K5731 Diverticulosis of large intestine without perforation or abscess with bleeding: Principal | ICD-10-CM

## 2013-08-11 DIAGNOSIS — K922 Gastrointestinal hemorrhage, unspecified: Secondary | ICD-10-CM

## 2013-08-11 LAB — CBC
HCT: 38.4 % — ABNORMAL LOW (ref 39.0–52.0)
Hemoglobin: 12.5 g/dL — ABNORMAL LOW (ref 13.0–17.0)
MCH: 27.7 pg (ref 26.0–34.0)
MCHC: 32.6 g/dL (ref 30.0–36.0)
MCV: 85 fL (ref 78.0–100.0)
RDW: 15.5 % (ref 11.5–15.5)

## 2013-08-11 MED ORDER — HYDROCHLOROTHIAZIDE 12.5 MG PO CAPS
12.5000 mg | ORAL_CAPSULE | Freq: Every day | ORAL | Status: DC
  Administered 2013-08-11: 17:00:00 12.5 mg via ORAL
  Filled 2013-08-11 (×2): qty 1

## 2013-08-11 NOTE — Progress Notes (Signed)
Brief Nutrition Note:   RD pulled to pt for positive malnutrition screening tool.  Pt reports that he had a poor appetite and weight loss for about 1 month. Appetite has improved and started to gain weight back. Is unable to drink oral nutrition supplements as they give him diarrhea. Waiting on lunch at this time, hungry.   Wt Readings from Last 5 Encounters:  08/09/13 221 lb 4.8 oz (100.381 kg)  10/15/12 235 lb 6.4 oz (106.777 kg)  09/04/12 238 lb 12.8 oz (108.319 kg)  12/31/11 233 lb 11 oz (106 kg)  02/25/09 255 lb (115.667 kg)   Body mass index is 27.66 kg/(m^2). overweight Diet: heart healthy  Chart reviewed, no nutrition interventions warranted at this time. Please consult as needed.   Isabell Jarvis RD, LDN Pager [phone] After Hours pager [phone]

## 2013-08-11 NOTE — Consult Note (Signed)
Gastroenterology Consultation  Referring Provider: [facility]      Primary Care Physician:  Sissy Hoff, MD Primary Gastroenterologist:  Rob Bunting, MD     Reason for Consultation:  GI bleed          HPI:   [name] is a 67 y.o. male with a history of diverticular disease,  adenomatous colon polyps and a post-polypectomy bleed in 2007. Patient was hospitalized in 2010 and again January 2013 with lower GI bleeding presumed to be diverticular.  Patient presented to ED yesterday afternoon with rectal bleeding associated with very mild constipation.   On Saturday (two days ago) patient had several episodes of painless hematochezia. Blood bright bed, large amount. Patient was slightly lightheaded on Saturday but otherwise felt okay. The last episode of bleeding or BM was yesterday around 11am. On Saturday he was slightly lightheaded. Hgb 12.5 today, down from baseline of around 14. No blood thinners. No NSAIDS.   Patient lost appetite a few weeks ago but after removing dentures his appetite  Improved for unclear reasons.   Past Medical History  Diagnosis Date  . Inflammatory polyps of colon   . Pneumothorax 2009    spontaneous, treated with chest tube.  Dr Lowella Fairy.  Marland Kitchen Hx of adenomatous colonic polyps 2007  . Hemorrhage of colon following colonoscopy 2007    post polypectomy bleed 2007, treated with endo clip and epi injection.  . Acute blood loss anemia 2007, 2010    from GI bleeds, required 2 PRBCs each episode.   Marland Kitchen Glaucoma     Past Surgical History  Procedure Laterality Date  . Tonsillectomy    . Colon polyps    . Small intestine surgery      diverticulotous    Family History  Problem Relation Age of Onset  . Heart disease Mother   . Diabetes Mother   . Hypertension Mother   . Cancer Father      History  Substance Use Topics  . Smoking status: Never Smoker   . Smokeless tobacco: Current User    Types: Chew     Comment: chews tobacco for 60 yrs(  grandpa gave it to him)  . Alcohol Use: Yes     Comment: occasionaly    Prior to Admission medications   Medication Sig Start Date End Date Taking? Authorizing Provider  atorvastatin (LIPITOR) 40 MG tablet Take 40 mg by mouth daily.   Yes Historical Provider, MD  lisinopril-hydrochlorothiazide (PRINZIDE,ZESTORETIC) 20-12.5 MG per tablet  10/09/12  Yes Historical Provider, MD  tadalafil (CIALIS) 5 MG tablet Take 5 mg by mouth daily.   Yes Historical Provider, MD  Tamsulosin HCl (FLOMAX) 0.4 MG CAPS Take 0.4 mg by mouth daily.   Yes Historical Provider, MD  Testosterone (ANDROGEL) 20.25 MG/1.25GM (1.62%) GEL Place onto the skin daily.   Yes Historical Provider, MD  VESICARE 5 MG tablet Take 5 mg by mouth daily.  10/11/12  Yes Historical Provider, MD    Current Facility-Administered Medications  Medication Dose Route Frequency Provider Last Rate Last Dose  . acetaminophen (TYLENOL) tablet 650 mg  650 mg Oral Q6H PRN Eduard Clos, MD       Or  . acetaminophen (TYLENOL) suppository 650 mg  650 mg Rectal Q6H PRN Eduard Clos, MD      . atorvastatin (LIPITOR) tablet 40 mg  40 mg Oral Daily Eduard Clos, MD   40 mg at 08/11/13 1003  . darifenacin (ENABLEX) 24 hr tablet  7.5 mg  7.5 mg Oral Daily Eduard Clos, MD   7.5 mg at 08/11/13 1003  . hydrALAZINE (APRESOLINE) injection 10 mg  10 mg Intravenous Q4H PRN Eduard Clos, MD      . ondansetron [facility]) tablet 4 mg  4 mg Oral Q6H PRN Eduard Clos, MD       Or  . ondansetron [facility]) injection 4 mg  4 mg Intravenous Q6H PRN Eduard Clos, MD      . sodium chloride 0.9 % injection 3 mL  3 mL Intravenous Q12H Eduard Clos, MD   3 mL at 08/11/13 1003  . tamsulosin (FLOMAX) capsule 0.4 mg  0.4 mg Oral Daily Eduard Clos, MD   0.4 mg at 08/11/13 1003    Allergies as of 08/09/2013 - Review Complete 08/09/2013  Allergen Reaction Noted  . Penicillins Itching and Rash     Review of Systems:     All systems reviewed and negative except where noted in HPI.   Physical Exam:  Vital signs in last 24 hours: Temp:  [98.5 F (36.9 C)-99 F (37.2 C)] 99 F (37.2 C) (09/15 0830) Pulse Rate:  [69-79] 74 (09/15 0830) Resp:  [18] 18 (09/15 0830) BP: (116-149)/(75-85) 149/82 mmHg (09/15 0830) SpO2:  [98 %-100 %] 99 % (09/15 0830) Last BM Date: 08/10/13 General:   Pleasant black male in NAD Head:  Normocephalic and atraumatic. Eyes:   No icterus.   Conjunctiva pink. Ears:  Normal auditory acuity. Neck:  Supple; no masses felt Lungs:  Respirations even and unlabored. Lungs clear to auscultation bilaterally.   No wheezes, crackles, or rhonchi.  Heart:  Regular rate and rhythm Abdomen:  Soft, nondistended, nontender. Normal bowel sounds. No appreciable masses or hepatomegaly.  Rectal:  No external lesions. Light brown thin secretions in anal vault..  Msk:  Symmetrical without gross deformities.  Extremities:  Without edema. Neurologic:  Alert and  oriented x4;  grossly normal neurologically. Skin:  Intact without significant lesions or rashes. Cervical Nodes:  No significant cervical adenopathy. Psych:  Alert and cooperative. Normal affect.  LAB RESULTS:  Recent Labs  08/10/13 0020 08/10/13 0620 08/11/13 0744  WBC 6.5 5.2 5.4  HGB 12.8* 12.3* 12.5*  HCT 38.8* 37.1* 38.4*  PLT 160 160 172   BMET  Recent Labs  08/09/13 1808 08/10/13 0650  NA 134* 136  K 3.3* 3.5  CL 99 103  CO2 27 26  GLUCOSE 92 96  BUN 10 10  CREATININE 0.99 1.05  CALCIUM 9.2 8.4   PT/INR  Recent Labs  08/09/13 1808  LABPROT 13.1  INR 1.01    PREVIOUS ENDOSCOPIES:            Colonoscopy March 2010 Russella Dar) for evaluation of hematochezia. FINDINGS: Fresh blood mixed with prep fluid was found throughout the colon but predominantly in the left colon. No active bleeding was noted. Moderate diverticulosis was found sigmoid to descending. A sessile polyp was found in the distal transverse colon.  It was 3 mm in size. The polyp was removed using cold biopsy forceps. This was otherwise a normal exam. Retroflexed views in the rectum revealed no abnormalities.   Polyp path= tubular adenoma    Impression / Plan:   70. 67 year old male with painless hematochezia. 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 No bleeding in over 24 hours now. Hopefully bleeding has resolved and patient will be stable for discharge tomorrow making inpatient colonoscopy unnecessary. .   2. History of  adenomatous colon polyps (2007, 2010). Due for surveillance colonoscopy March 2015 with Dr. Christella Hartigan.   3. HTN, treated.   Thanks   LOS: 2 days   Willette Cluster  08/11/2013, 12:59 PM  GI ATTENDING  History, laboratories, prior endoscopy reports reviewed. Patient personally seen and examined. Agree with H&P as outlined above. Patient presents with painless hematochezia. Stable hemoglobin. I suspect this is a self-limited diverticular bleed. No bleeding today. Advance diet as tolerated and observe overnight. If doing well Loraine Leriche, can go home. Will be due for surveillance colonoscopy next year. Will follow. Thank you  Wilhemina Bonito. Eda Keys., M.D. [facility] Division of Gastroenterology

## 2013-08-11 NOTE — Progress Notes (Signed)
[facility] PROGRESS NOTE  [name] ZOX:[id number] DOB: 08-21-1946 DOA: 08/09/2013 PCP: Sissy Hoff, MD  Assessment/Plan: 1. Rectal bleed - given history of colonoscopy done in 2010 and diverticula reported on that exam, I agree at this point that diverticular source for GI bleed most likely.  As such will continue to monitor patient and provide supportive therapy.  Transfuse if hgb less than 7.9. - consulted GI and patient should patient have no more rectal bleeding will consider discharging tomorrow. - Advance diet as tolerate currently on heart healthy diet. - Hgb steady on recheck.  2. HTN - Continue prn hydralazine for elevated BP's. - Will start home dose of hctz at this point.  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 Disposition Plan: Pending cessation of GI bleed and improvement in condition.    Consultants:  None currently  Procedures:  none  Antibiotics:  none   HPI/Subjective: Patient denies any diarrhea, abdominal discomfort, nausea. No new complaints  Objective: Filed Vitals:   08/11/13 0830  BP: 149/82  Pulse: 74  Temp: 99 F (37.2 C)  Resp: 18    Intake/Output Summary (Last 24 hours) at 08/11/13 1552 Last data filed at 08/11/13 1003  Gross per 24 hour  Intake    243 ml  Output      0 ml  Net    243 ml   Filed Weights   08/09/13 2030  Weight: 100.381 kg (221 lb 4.8 oz)    Exam:   General:  Pt in NAD, Alert and Awake  Cardiovascular: RRR, no MRG  Respiratory: CTA BL, no wheeze  Abdomen: soft, ND  Musculoskeletal: no cyanosis or clubbing.   Data Reviewed: Basic Metabolic Panel:  Recent Labs Lab 08/09/13 1808 08/10/13 0650  NA 134* 136  K 3.3* 3.5  CL 99 103  CO2 27 26  GLUCOSE 92 96  BUN 10 10  CREATININE 0.99 1.05  CALCIUM 9.2 8.4   Liver Function Tests: No results found for this basename: AST, ALT,  ALKPHOS, BILITOT, PROT, ALBUMIN,  in the last 168 hours No results found for this basename: LIPASE, AMYLASE,  in the last 168 hours No results found for this basename: AMMONIA,  in the last 168 hours CBC:  Recent Labs Lab 08/09/13 1808 08/09/13 2130 08/10/13 0020 08/10/13 0620 08/11/13 0744  WBC 6.1 6.1 6.5 5.2 5.4  NEUTROABS 3.4  --   --   --   --   HGB 14.2 13.4 12.8* 12.3* 12.5*  HCT 43.0 40.7 38.8* 37.1* 38.4*  MCV 84.5 84.3 84.2 84.3 85.0  PLT 181 184 160 160 172   Cardiac Enzymes: No results found for this basename: CKTOTAL, CKMB, CKMBINDEX, TROPONINI,  in the last 168 hours BNP (last 3 results) No results found for this basename: PROBNP,  in the last 8760 hours CBG: No results found for this basename: GLUCAP,  in the last 168 hours  No results found for this or any previous visit (from the past 240 hour(s)).   Studies: No results found.  Scheduled Meds: . atorvastatin  40 mg Oral Daily  . darifenacin  7.5 mg Oral Daily  . sodium chloride  3 mL Intravenous Q12H  . tamsulosin  0.4 mg Oral Daily   Continuous Infusions:    Principal Problem:   GI BLEED Active Problems:   HYPERTENSION NEC   Other and unspecified hyperlipidemia    Time spent: >  35 minutes    Penny Pia  [facility] Pager [phone]. If 7PM-7AM, please contact night-coverage at [email or web address], password [facility] 08/11/2013, 3:52 PM  LOS: 2 days

## 2013-08-12 NOTE — Progress Notes (Signed)
Discussed discharge instructions and medications with pt. Pt showed no barriers to discharge. IV removed. Tele removed. Pt discharged to home with family. Assessment unchanged from morning.  

## 2013-08-12 NOTE — Progress Notes (Signed)
     [facility] Daily Rounding Note 08/12/2013, 9:31 AM  SUBJECTIVE:       Feels great.  Eating solids.  No stools or bleeding since 9/14. No abdominial pain.  Not dizzy.  Asking about discharge  OBJECTIVE:         Vital signs in last 24 hours:    Temp:  [98.1 F (36.7 C)-98.7 F (37.1 C)] 98.1 F (36.7 C) (09/16 0532) Pulse Rate:  [60-84] 71 (09/16 0532) Resp:  [18-20] 20 (09/16 0532) BP: (130-155)/(74-91) 130/86 mmHg (09/16 0532) SpO2:  [98 %-100 %] 98 % (09/16 0532) Last BM Date: 08/10/13 General: looks very well   Heart: RRR Chest: clear bil.  No resp distress Abdomen: soft, active BS, NT, ND  Extremities: no CCE Neuro/Psych:  Pleasant, relaxed.  No confusion.  Intelligent.   Intake/Output from previous day: 09/15 0701 - 09/16 0700 In: 843 [P.O.:840; I.V.:3] Out: 4 [Urine:4]  Intake/Output this shift:    Lab Results:  Recent Labs  08/10/13 0020 08/10/13 0620 08/11/13 0744  WBC 6.5 5.2 5.4  HGB 12.8* 12.3* 12.5*  HCT 38.8* 37.1* 38.4*  PLT 160 160 172   BMET  Recent Labs  08/09/13 1808 08/10/13 0650  NA 134* 136  K 3.3* 3.5  CL 99 103  CO2 27 26  GLUCOSE 92 96  BUN 10 10  CREATININE 0.99 1.05  CALCIUM 9.2 8.4   LFT No results found for this basename: PROT, ALBUMIN, AST, ALT, ALKPHOS, BILITOT, BILIDIR, IBILI,  in the last 72 hours PT/INR  Recent Labs  08/09/13 1808  LABPROT 13.1  INR 1.01   Hepatitis Panel No results found for this basename: HEPBSAG, HCVAB, HEPAIGM, HEPBIGM,  in the last 72 hours  Studies/Results: No results found.  ASSESMENT: *  Limited lower GI bleed.  Presumed diverticular source.  This is his 3rd episode in a few years.  Not on  ASA or any other thinners.   Hgb stable.  No bleeding since 9/14.  Asymptomatic.  *  Adenomatous colon polyps 2007, 2010.  Re look colonoscope due 01/2014.    PLAN: *  Ok to discharge home.  Reminded pt he can contact Dr Christella Hartigan if any problems or concerns re bleeding.  Has GI ROV set for  09/12/13.     LOS: 3 days   Jennye Moccasin  08/12/2013, 9:31 AM Pager: [phone]  GI ATTENDING  Patient remains stable without bleeding. Presumed minor diverticular bleed. GI followup as noted. I suspect we will want to set up his anniversary colonoscopy sooner than March, given bleeding. He will see Dr. Christella Hartigan in the office in a few weeks. He doesn't contact the office in the interim for any problems such as recurrent bleeding  Milly Goggins N. Eda Keys., M.D. [facility] Division of Gastroenterology

## 2013-08-12 NOTE — Discharge Summary (Signed)
Physician Discharge Summary  [name] WUJ:[id number] DOB: [birth year] DOA: 08/09/2013  PCP: Sissy Hoff, MD  Admit date: 08/09/2013 Discharge date: 08/12/2013  Time spent: 35 minutes  Recommendations for Outpatient Follow-up:  1. Please be sure to f/u on H/H on follow up 2. Encourage tobacco cessation  Discharge Diagnoses:  Principal Problem:   GI BLEED Active Problems:   HYPERTENSION NEC   Other and unspecified hyperlipidemia   HTN (hypertension)   Discharge Condition: stable  Diet recommendation: Low sodium heart healthy  Filed Weights   08/09/13 2030  Weight: 100.381 kg (221 lb 4.8 oz)    History of present illness:  67 y/o with history of diverticulosis who presented with painless GI bleed.  Hospital Course:   1. Rectal bleed - given history of colonoscopy done in 2010 and diverticula reported on that exam, I agree at this point that diverticular source for GI bleed most likely.  - GI evaluated while in house and plan is for patient to f/u with GI in 1 month. Colonoscopy for 01/2014  - Tolerating diet. - Hgb steady on recheck.   2. HTN  - Continue home regimen - Stable  3. HPL  - stable will continue lipitor  Procedures:  None  Consultations:  GI: Jennye Moccasin  Discharge Exam: Filed Vitals:   08/12/13 0532  BP: 130/86  Pulse: 71  Temp: 98.1 F (36.7 C)  Resp: 20    General: Pt in NAD, Alert and Awake Cardiovascular: RRR, no MRG Respiratory: CTA BL, no wheezes  Discharge Instructions  Discharge Orders   Future Appointments Provider Department Dept Phone   09/12/2013 8:45 AM Rachael Fee, MD [facility] [phone]   Future Orders Complete By Expires   Call MD for:  extreme fatigue  As directed    Call MD for:  persistant dizziness or light-headedness  As directed    Diet - low sodium heart healthy  As directed    Discharge instructions  As directed    Comments:     Please be sure to f/u with your GI  doctor in 1 month   Increase activity slowly  As directed        Medication List         ANDROGEL 20.25 MG/1.25GM (1.62%) Gel  Generic drug:  Testosterone  Place onto the skin daily.     atorvastatin 40 MG tablet  Commonly known as:  LIPITOR  Take 40 mg by mouth daily.     lisinopril-hydrochlorothiazide 20-12.5 MG per tablet  Commonly known as:  PRINZIDE,ZESTORETIC     tadalafil 5 MG tablet  Commonly known as:  CIALIS  Take 5 mg by mouth daily.     tamsulosin 0.4 MG Caps capsule  Commonly known as:  FLOMAX  Take 0.4 mg by mouth daily.     VESICARE 5 MG tablet  Generic drug:  solifenacin  Take 5 mg by mouth daily.       Allergies  Allergen Reactions  . Penicillins Itching and Rash       Follow-up Information   Follow up with Rachael Fee, MD On 09/12/2013. (8:45 AM.  follow up of colonic bleeding. )    Specialty:  Gastroenterology   Contact information:   [address] [place] [address] [phone]        The results of significant diagnostics from this hospitalization (including imaging, microbiology, ancillary and laboratory) are listed below for reference.    Significant Diagnostic Studies: No results  found.  Microbiology: No results found for this or any previous visit (from the past 240 hour(s)).   Labs: Basic Metabolic Panel:  Recent Labs Lab 08/09/13 1808 08/10/13 0650  NA 134* 136  K 3.3* 3.5  CL 99 103  CO2 27 26  GLUCOSE 92 96  BUN 10 10  CREATININE 0.99 1.05  CALCIUM 9.2 8.4   Liver Function Tests: No results found for this basename: AST, ALT, ALKPHOS, BILITOT, PROT, ALBUMIN,  in the last 168 hours No results found for this basename: LIPASE, AMYLASE,  in the last 168 hours No results found for this basename: AMMONIA,  in the last 168 hours CBC:  Recent Labs Lab 08/09/13 1808 08/09/13 2130 08/10/13 0020 08/10/13 0620 08/11/13 0744  WBC 6.1 6.1 6.5 5.2 5.4  NEUTROABS 3.4  --   --   --   --   HGB 14.2 13.4  12.8* 12.3* 12.5*  HCT 43.0 40.7 38.8* 37.1* 38.4*  MCV 84.5 84.3 84.2 84.3 85.0  PLT 181 184 160 160 172   Cardiac Enzymes: No results found for this basename: CKTOTAL, CKMB, CKMBINDEX, TROPONINI,  in the last 168 hours BNP: BNP (last 3 results) No results found for this basename: PROBNP,  in the last 8760 hours CBG: No results found for this basename: GLUCAP,  in the last 168 hours     Signed:  Penny Pia  [facility] 08/12/2013, 10:20 AM

## 2013-08-12 NOTE — Progress Notes (Signed)
Utilization review completed. Kaylor Maiers RN CCM Case Mgmt  

## 2013-09-12 ENCOUNTER — Encounter: Payer: Self-pay | Admitting: Gastroenterology

## 2013-09-12 ENCOUNTER — Ambulatory Visit (INDEPENDENT_AMBULATORY_CARE_PROVIDER_SITE_OTHER): Payer: Medicare PPO | Admitting: Gastroenterology

## 2013-09-12 VITALS — BP 134/92 | HR 64 | Ht 73.25 in | Wt 230.4 lb

## 2013-09-12 DIAGNOSIS — Z86010 Personal history of colonic polyps: Secondary | ICD-10-CM

## 2013-09-12 DIAGNOSIS — K5731 Diverticulosis of large intestine without perforation or abscess with bleeding: Secondary | ICD-10-CM

## 2013-09-12 MED ORDER — MOVIPREP 100 G PO SOLR: 1.0000 | Freq: Once | ORAL | Status: DC

## 2013-09-12 NOTE — Progress Notes (Signed)
Review of pertinent gastrointestinal problems:  1. History of tubular adenomas. Colonoscopy by Dr. Victorino Dike, September 2007 showed a 7 mm sessile polyp removed by hot biopsy, and this was found to be a tubular adenoma. This was complicated by post polypectomy bleeding that was treated by Dr. Barnet Pall with repeat colonoscopy, injection and Endoclip placement of the site in his cecum.  3. History of internal hemorrhoids. 3. Recurrent painless rectal bleeding, presumed diverticular:  Admitted 2010( 2 units blood) colonsocopy Dr. Russella Dar found blood (predominantly in left colon) diverticulum in left colon, small TA (recommended recall surveillance colonoscopy in 5 years. , admitted 11/2011 (2-3 units blood), admitted 07/2013 (no blood needed).  HPI: This is a  very pleasant 67 year old man who I have known for quite a while. He is here after hospitalization for overt painless rectal bleeding. This is his third episode of overt painless rectal bleeding. We have presumed these to be diverticular in origin. See the workup so above.  Since discharge he has done fine he feels completely well between these episodes. He can point to no specific cause, no physical exertion no changes in his diet, no particular medicines they're associated with his bleeding. He has avoided aspirin and NSAID products for many years, really since his first overt bleeding.   Past Medical History  Diagnosis Date  . Inflammatory polyps of colon   . Pneumothorax 2009    spontaneous, treated with chest tube.  Dr Lowella Fairy.  Marland Kitchen Hx of adenomatous colonic polyps 2007  . Hemorrhage of colon following colonoscopy 2007    post polypectomy bleed 2007, treated with endo clip and epi injection.  . Acute blood loss anemia 2007, 2010    from GI bleeds, required 2 PRBCs each episode.   . Glaucoma   . HLD (hyperlipidemia)   . HTN (hypertension)   . BPH (benign prostatic hyperplasia)   . ED (erectile dysfunction)     Past Surgical History   Procedure Laterality Date  . Tonsillectomy    . Hernia repair      Current Outpatient Prescriptions  Medication Sig Dispense Refill  . atorvastatin (LIPITOR) 20 MG tablet Take 20 mg by mouth daily.      Marland Kitchen lisinopril-hydrochlorothiazide (PRINZIDE,ZESTORETIC) 20-12.5 MG per tablet       . tadalafil (CIALIS) 5 MG tablet Take 5 mg by mouth daily.      . Tamsulosin HCl (FLOMAX) 0.4 MG CAPS Take 0.4 mg by mouth daily.      . Testosterone (ANDROGEL) 20.25 MG/1.25GM (1.62%) GEL Place onto the skin daily.      . VESICARE 5 MG tablet Take 5 mg by mouth daily.        No current facility-administered medications for this visit.    Allergies as of 09/12/2013 - Review Complete 09/12/2013  Allergen Reaction Noted  . Penicillins Itching and Rash     Family History  Problem Relation Age of Onset  . Heart disease Mother   . Diabetes Mother   . Hypertension Mother   . Cancer Father     type unknown, mets    History   Social History  . Marital Status: Married    Spouse Name: N/A    Number of Children: 2  . 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 Smoking status: Never Smoker   . Smokeless tobacco: Current User    Types: Chew     Comment: chews  tobacco for 60 yrs( grandpa gave it to him)  . Alcohol Use: Yes     Comment: occasionaly  . Drug Use: No  . Sexual Activity: Not on file   Other Topics Concern  . Not on file   Social History Narrative  . No narrative on file      Physical Exam: Ht 6' 1.25" (1.861 m)  Wt 230 lb 6 oz (104.497 kg)  BMI 30.17 kg/m2 Constitutional: generally well-appearing Psychiatric: alert and oriented x3 Abdomen: soft, nontender, nondistended, no obvious ascites, no peritoneal signs, normal bowel sounds     Assessment and plan: 67 y.o. male with personal history of adenomatous polyp, recurrence lower GI bleeding that has been presumed to be diverticular.  Colonoscopy 4 years ago  showed left sided diverticulosis only. He is due for polyp surveillance colonoscopy in the next 4 or 5 months and I would like to do that sooner. My goal be to survey him for recurrent polyps as well as to very clearly map out the extent of his diverticular disease. If he truly only has diverticulosis in his left colon then he and I discussed referral to general surgeon to consider segmental colectomy. If however he has diverticulosis throughout his colon or other potential areas or lesions of his colon that could cause overt leading such as AVMs, then we would hold on that referral.

## 2013-09-12 NOTE — Patient Instructions (Addendum)
You have been given a separate informational sheet regarding your tobacco use, the importance of quitting and local resources to help you quit. You will be set up for a colonoscopy for polyps surveillance, recent gi bleeding. Pending those results, will consider sending you to general surgeon to consider elective segmental colectomy.

## 2013-09-17 ENCOUNTER — Encounter: Payer: Self-pay | Admitting: Gastroenterology

## 2013-09-23 ENCOUNTER — Other Ambulatory Visit: Payer: Self-pay

## 2013-09-23 ENCOUNTER — Encounter: Payer: Self-pay | Admitting: Gastroenterology

## 2013-09-23 ENCOUNTER — Ambulatory Visit (AMBULATORY_SURGERY_CENTER): Payer: Managed Care, Other (non HMO) | Admitting: Gastroenterology

## 2013-09-23 VITALS — BP 115/73 | HR 64 | Temp 96.3°F | Resp 17 | Ht 73.0 in | Wt 230.0 lb

## 2013-09-23 DIAGNOSIS — Z86010 Personal history of colonic polyps: Secondary | ICD-10-CM

## 2013-09-23 DIAGNOSIS — K922 Gastrointestinal hemorrhage, unspecified: Secondary | ICD-10-CM

## 2013-09-23 DIAGNOSIS — K5730 Diverticulosis of large intestine without perforation or abscess without bleeding: Secondary | ICD-10-CM

## 2013-09-23 DIAGNOSIS — D126 Benign neoplasm of colon, unspecified: Secondary | ICD-10-CM

## 2013-09-23 MED ORDER — SODIUM CHLORIDE 0.9 % IV SOLN: 500.0000 mL | INTRAVENOUS | Status: DC

## 2013-09-23 NOTE — Progress Notes (Addendum)
Patient did not experience any of the following events: a burn prior to discharge; a fall within the facility; wrong site/side/patient/procedure/implant event; or a hospital transfer or hospital admission upon discharge from the facility. (G8907)Patient did not have preoperative order for IV antibiotic SSI prophylaxis. [phone])  Pt did not pass gas in recovery, abd is soft non-distended and pt denies cramping or pain, once 30 mins had passed pt was allowed to get dressed and go to restroom to see if he could pass air, pt passed air while getting dressed.

## 2013-09-23 NOTE — Op Note (Signed)
[facility] [address] [place], [address]   COLONOSCOPY PROCEDURE REPORT PATIENT: Garrett, Moreno  MR#: [id number] BIRTHDATE: August 25, 1946 , 67  yrs. old GENDER: Male ENDOSCOPIST: Rachael Fee, MD PROCEDURE DATE:  09/23/2013 PROCEDURE:   Colonoscopy with snare polypectomy First Screening Colonoscopy - Avg.  risk and is 50 yrs.  old or older - No.  Prior Negative Screening - Now for repeat screening. N/A  History of Adenoma - Now for follow-up colonoscopy & has been > or = to 3 yrs.  Yes hx of adenoma.  Has been 3 or more years since last colonoscopy.  Polyps Removed Today? Yes. ASA CLASS:   Class II INDICATIONS:History of tubular adenomas.  Colonoscopy by Dr.  Victorino Dike, September 2007 showed a 7 mm sessile polyp removed by hot biopsy, and this was found to be a tubular adenoma.  This was complicated by post polypectomy bleeding that was treated by Dr. Barnet Pall with repeat colonoscopy, injection and Endoclip placement of the site in his cecum.. Recurrent painless rectal bleeding, presumed diverticular: Admitted 2010( 2 units blood) colonsocopy Dr. Russella Dar found blood (predominantly in left colon) diverticulum in left colon, small TA (recommended recall surveillance colonoscopy in 5 years. , admitted 11/2011 (2-3 units blood), admitted 07/2013 (no blood needed). MEDICATIONS: Fentanyl 75 mcg IV, Versed 6 mg IV, and These medications were titrated to patient response per physician's verbal order DESCRIPTION OF PROCEDURE:   After the risks benefits and alternatives of the procedure were thoroughly explained, informed consent was obtained.  A digital rectal exam revealed no abnormalities of the rectum.   The [id number]  endoscope was introduced through the anus and advanced to the cecum, which was identified by both the appendix and ileocecal valve. No adverse events experienced.   The quality of the prep was good.  The instrument was then slowly  withdrawn as the colon was fully examined.  COLON FINDINGS: There were diverticulum, multiple, confined to the left colon.  The most proximal aspect of the diverticulosis was proximal descending colon.  There was one polyp in descending colon, this was sessile, 3mm across, removed with cold snare and sent to pathology.  The examination was otherwise normal. Retroflexed views revealed no abnormalities. The time to cecum=3 minutes 21 seconds.  Withdrawal time=9 minutes 35 seconds.  The scope was withdrawn and the procedure completed. COMPLICATIONS: There were no complications. ENDOSCOPIC IMPRESSION: There were diverticulum, multiple, confined to the left colon.  The most proximal aspect of the diverticulosis was proximal descending colon.  There was one polyp in descending colon, this was sessile, 3mm across, removed with cold snare and sent to pathology.  The examination was otherwise normal. RECOMMENDATIONS: If the polyp(s) removed today are proven to be adenomatous (pre-cancerous) polyps, you will need a repeat colonoscopy in 5 years.  You will receive a letter within 1-2 weeks with the results of your biopsy as well as final recommendations.  Please call my office if you have not received a letter after 3 weeks. My office will set up referral to general surgeon to consider elective left hemicolectomy for your recurrent GI bleeding.  eSigned:  Rachael Fee, MD 09/23/2013 3:45 PM     PATIENT NAME:  [name], [name] MR#: [id number]

## 2013-09-23 NOTE — Patient Instructions (Signed)

## 2013-09-24 ENCOUNTER — Telehealth: Payer: Self-pay

## 2013-09-24 ENCOUNTER — Telehealth: Payer: Self-pay | Admitting: *Deleted

## 2013-09-24 NOTE — Telephone Encounter (Signed)
Message copied by Donata Duff on Wed Sep 24, 2013 10:23 AM ------      Message from: Zacarias Pontes      Created: Wed Sep 24, 2013  8:47 AM       Spoke with pt`s wife this morning,pt not ready to have this sx,will discuss and call [facility] back just to talk to Dr Antonieta Pert for more info at a later date....Liborio Nixon       ----- Message -----         From: Donata Duff, CMA         Sent: 09/23/2013   4:12 PM           To: Zacarias Pontes            referral to general surgeon to consider      elective left hemicolectomy for your recurrent GI bleeding.             ------

## 2013-09-24 NOTE — Telephone Encounter (Signed)
ok 

## 2013-09-24 NOTE — Telephone Encounter (Signed)
Message left

## 2013-09-24 NOTE — Telephone Encounter (Signed)
FYI Dr Jacobs  

## 2013-09-25 ENCOUNTER — Encounter (INDEPENDENT_AMBULATORY_CARE_PROVIDER_SITE_OTHER): Payer: BC Managed Care – PPO | Admitting: General Surgery

## 2013-10-01 ENCOUNTER — Encounter: Payer: Self-pay | Admitting: Gastroenterology

## 2014-01-22 ENCOUNTER — Inpatient Hospital Stay (HOSPITAL_COMMUNITY)
Admission: EM | Admit: 2014-01-22 | Discharge: 2014-01-26 | DRG: 378 | Discharge status: Against Medical Advice | Payer: Managed Care, Other (non HMO) | Attending: Internal Medicine | Admitting: Internal Medicine

## 2014-01-22 ENCOUNTER — Encounter (HOSPITAL_COMMUNITY): Payer: Self-pay | Admitting: Emergency Medicine

## 2014-01-22 DIAGNOSIS — I951 Orthostatic hypotension: Secondary | ICD-10-CM | POA: Diagnosis not present

## 2014-01-22 DIAGNOSIS — E785 Hyperlipidemia, unspecified: Secondary | ICD-10-CM | POA: Diagnosis present

## 2014-01-22 DIAGNOSIS — Z809 Family history of malignant neoplasm, unspecified: Secondary | ICD-10-CM | POA: Diagnosis not present

## 2014-01-22 DIAGNOSIS — Z8249 Family history of ischemic heart disease and other diseases of the circulatory system: Secondary | ICD-10-CM | POA: Diagnosis not present

## 2014-01-22 DIAGNOSIS — I10 Essential (primary) hypertension: Secondary | ICD-10-CM | POA: Diagnosis present

## 2014-01-22 DIAGNOSIS — F17200 Nicotine dependence, unspecified, uncomplicated: Secondary | ICD-10-CM | POA: Diagnosis present

## 2014-01-22 DIAGNOSIS — N400 Enlarged prostate without lower urinary tract symptoms: Secondary | ICD-10-CM | POA: Diagnosis present

## 2014-01-22 DIAGNOSIS — Z79899 Other long term (current) drug therapy: Secondary | ICD-10-CM | POA: Diagnosis not present

## 2014-01-22 DIAGNOSIS — K921 Melena: Secondary | ICD-10-CM | POA: Diagnosis present

## 2014-01-22 DIAGNOSIS — K922 Gastrointestinal hemorrhage, unspecified: Secondary | ICD-10-CM

## 2014-01-22 DIAGNOSIS — H409 Unspecified glaucoma: Secondary | ICD-10-CM | POA: Diagnosis present

## 2014-01-22 DIAGNOSIS — E876 Hypokalemia: Secondary | ICD-10-CM | POA: Diagnosis present

## 2014-01-22 DIAGNOSIS — Z880 Allergy status to penicillin: Secondary | ICD-10-CM

## 2014-01-22 DIAGNOSIS — I959 Hypotension, unspecified: Secondary | ICD-10-CM | POA: Diagnosis not present

## 2014-01-22 DIAGNOSIS — D126 Benign neoplasm of colon, unspecified: Secondary | ICD-10-CM | POA: Diagnosis present

## 2014-01-22 DIAGNOSIS — K5731 Diverticulosis of large intestine without perforation or abscess with bleeding: Principal | ICD-10-CM | POA: Diagnosis present

## 2014-01-22 DIAGNOSIS — Z860100 Personal history of colon polyps, unspecified: Secondary | ICD-10-CM

## 2014-01-22 DIAGNOSIS — D62 Acute posthemorrhagic anemia: Secondary | ICD-10-CM | POA: Diagnosis present

## 2014-01-22 LAB — CBC WITH DIFFERENTIAL/PLATELET
BASOS ABS: 0 10*3/uL (ref 0.0–0.1)
BASOS PCT: 0 % (ref 0–1)
EOS ABS: 0.2 10*3/uL (ref 0.0–0.7)
EOS PCT: 3 % (ref 0–5)
HCT: 43.2 % (ref 39.0–52.0)
Hemoglobin: 14.7 g/dL (ref 13.0–17.0)
Lymphocytes Relative: 28 % (ref 12–46)
Lymphs Abs: 1.8 10*3/uL (ref 0.7–4.0)
MCH: 27.8 pg (ref 26.0–34.0)
MCHC: 34 g/dL (ref 30.0–36.0)
MCV: 81.8 fL (ref 78.0–100.0)
Monocytes Absolute: 0.5 10*3/uL (ref 0.1–1.0)
Monocytes Relative: 8 % (ref 3–12)
Neutro Abs: 4 10*3/uL (ref 1.7–7.7)
Neutrophils Relative %: 61 % (ref 43–77)
PLATELETS: 207 10*3/uL (ref 150–400)
RBC: 5.28 MIL/uL (ref 4.22–5.81)
RDW: 16.6 % — AB (ref 11.5–15.5)
WBC: 6.6 10*3/uL (ref 4.0–10.5)

## 2014-01-22 LAB — POC OCCULT BLOOD, ED: Fecal Occult Bld: POSITIVE — AB

## 2014-01-22 NOTE — ED Provider Notes (Signed)
CSN: [id number]     Arrival date & time 01/22/14  2205 History   First MD Initiated Contact with Patient 01/22/14 2218     Chief Complaint  Patient presents with  . Rectal Bleeding   HPI  History provided by the patient and significant other. Patient is a 68 year old male with a history of hypertension, hyperlipidemia, diverticula with diverticular bleeding who presents with concerns for a large amount of gross blood per rectum. Patient reports having a bowel movement earlier in the evening but then felt the urge to have a second bowel movement about an hour prior to arrival. Patient states that he only passed a large amount of blood which was mostly bright red with some dark blood. Patient was concerned because he has had 4 episodes of similar significant rectal bleeding in the past on several times he's required blood transfusion. He is followed by Dr. Ardis Hughs with [facility] diverticular. He has occasionally had small polyps removed but has no history of other masses or tumors. He has been feeling well recently without any other complaints. He has not felt lightheaded or short of breath since the episode. He has not had any other bleeding from the rectum or into the clothing. Denies any abdominal pain. No fever, chills or sweats. No nausea or vomiting.    Past Medical History  Diagnosis Date  . Inflammatory polyps of colon   . Pneumothorax 2009    spontaneous, treated with chest tube.  Dr Roxy Horseman.  Marland Kitchen Hx of adenomatous colonic polyps 2007  . Hemorrhage of colon following colonoscopy 2007    post polypectomy bleed 2007, treated with endo clip and epi injection.  . Acute blood loss anemia 2007, 2010    from GI bleeds, required 2 PRBCs each episode.   . Glaucoma   . HLD (hyperlipidemia)   . HTN (hypertension)   . BPH (benign prostatic hyperplasia)   . ED (erectile dysfunction)    Past Surgical History  Procedure Laterality Date  . Tonsillectomy    . Hernia repair     Family History   Problem Relation Age of Onset  . Heart disease Mother   . Diabetes Mother   . Hypertension Mother   . Cancer Father     type unknown, mets   History  Substance Use Topics  . Smoking status: Never Smoker   . Smokeless tobacco: Current User    Types: Chew     Comment: chews tobacco for 60 yrs( grandpa gave it to him)  . Alcohol Use: Yes     Comment: occasionaly    Review of Systems  Constitutional: Negative for fever, chills and fatigue.  Respiratory: Negative for cough and shortness of breath.   Gastrointestinal: Positive for blood in stool. Negative for nausea, vomiting, abdominal pain, diarrhea and rectal pain.  Neurological: Negative for dizziness, syncope and light-headedness.  All other systems reviewed and are negative.      Allergies  Penicillins  Home Medications   Current Outpatient Rx  Name  Route  Sig  Dispense  Refill  . atorvastatin (LIPITOR) 20 MG tablet   Oral   Take 10 mg by mouth daily.          Marland Kitchen lisinopril-hydrochlorothiazide (PRINZIDE,ZESTORETIC) 20-12.5 MG per tablet   Oral   Take 1 tablet by mouth daily.          Marland Kitchen loratadine (CLARITIN) 10 MG tablet   Oral   Take 10 mg by mouth daily as needed for allergies.         Marland Kitchen  OVER THE COUNTER MEDICATION   Oral   Take 3 capsules by mouth every evening. *otc male enhancement capsule Zytenz*         . Tamsulosin HCl (FLOMAX) 0.4 MG CAPS   Oral   Take 0.4 mg by mouth every morning.          . Testosterone (ANDROGEL) 20.25 MG/1.25GM (1.62%) GEL   Transdermal   Place 3 Squirts onto the skin daily.          . VESICARE 5 MG tablet   Oral   Take 5 mg by mouth daily.           BP 168/99  Pulse 85  Temp(Src) 98.5 F (36.9 C) (Oral)  Resp 18  SpO2 99% Physical Exam  Nursing note and vitals reviewed. Constitutional: He is oriented to person, place, and time. He appears well-developed and well-nourished. No distress.  HENT:  Head: Normocephalic.  Cardiovascular: Normal rate and  regular rhythm.   No murmur heard. Pulmonary/Chest: Effort normal and breath sounds normal. No respiratory distress. He has no wheezes. He has no rales.  Abdominal: Soft. There is no tenderness. There is no rebound and no guarding.  Genitourinary: Guaiac positive stool.  Gross blood mixed with stool on exam. No external hemorrhoids or fissure. No pain. No masses.  Neurological: He is alert and oriented to person, place, and time.  Skin: Skin is warm.  Psychiatric: He has a normal mood and affect. His behavior is normal.    ED Course  Procedures   DIAGNOSTIC STUDIES: Oxygen Saturation is 99% on RA.    COORDINATION OF CARE:  Nursing notes reviewed. Vital signs reviewed. Initial pt interview and examination performed.   11:32 PM-patient seen and evaluated. Patient is well-appearing no acute distress. Does not appear pale. No lightheadedness or near syncope. Discussed work up plan with pt at bedside, which includes lab testing. Pt agrees with plan.  12:20AM spoke with Dr. Olen Pel on call with [facility]. She will see patient and admit.    Results for orders placed during the hospital encounter of 01/22/14  POC OCCULT BLOOD, ED      Result Value Ref Range   Fecal Occult Bld POSITIVE (*) NEGATIVE   Results for orders placed during the hospital encounter of 01/22/14  CBC WITH DIFFERENTIAL      Result Value Ref Range   WBC 6.6  4.0 - 10.5 K/uL   RBC 5.28  4.22 - 5.81 MIL/uL   Hemoglobin 14.7  13.0 - 17.0 g/dL   HCT 43.2  39.0 - 52.0 %   MCV 81.8  78.0 - 100.0 fL   MCH 27.8  26.0 - 34.0 pg   MCHC 34.0  30.0 - 36.0 g/dL   RDW 16.6 (*) 11.5 - 15.5 %   Platelets 207  150 - 400 K/uL   Neutrophils Relative % 61  43 - 77 %   Neutro Abs 4.0  1.7 - 7.7 K/uL   Lymphocytes Relative 28  12 - 46 %   Lymphs Abs 1.8  0.7 - 4.0 K/uL   Monocytes Relative 8  3 - 12 %   Monocytes Absolute 0.5  0.1 - 1.0 K/uL   Eosinophils Relative 3  0 - 5 %   Eosinophils Absolute 0.2  0.0 - 0.7 K/uL    Basophils Relative 0  0 - 1 %   Basophils Absolute 0.0  0.0 - 0.1 K/uL  BASIC METABOLIC PANEL      Result Value  Ref Range   Sodium 136 (*) 137 - 147 mEq/L   Potassium 3.6 (*) 3.7 - 5.3 mEq/L   Chloride 100  96 - 112 mEq/L   CO2 26  19 - 32 mEq/L   Glucose, Bld 135 (*) 70 - 99 mg/dL   BUN 12  6 - 23 mg/dL   Creatinine, Ser 1.07  0.50 - 1.35 mg/dL   Calcium 9.0  8.4 - 10.5 mg/dL   GFR calc non Af Amer 71 (*) >90 mL/min   GFR calc Af Amer 82 (*) >90 mL/min  POC OCCULT BLOOD, ED      Result Value Ref Range   Fecal Occult Bld POSITIVE (*) NEGATIVE  TYPE AND SCREEN      Result Value Ref Range   ABO/RH(D) A POS     Antibody Screen NEG     Sample Expiration 01/25/2014          MDM   Final diagnoses:  GI bleed      Martie Lee, PA-C 01/23/14 0024

## 2014-01-22 NOTE — ED Notes (Addendum)
Pt st's he had a bright red stool approx 40 mins ago.  Pt denies any pain.  Pt st's he has had this before and was told to come to ED if it happened again.

## 2014-01-22 NOTE — ED Notes (Signed)
Pt states that when he has episodes of rectal bleeding, he frequently "passes out". Pt also reports that he was diagnosed with diverticulitis, but his PCP was unable to find the source of the bleeding. Pt states that his PCP, who is with [facility], would like to be notified of this instance of bleeding.

## 2014-01-23 ENCOUNTER — Inpatient Hospital Stay (HOSPITAL_COMMUNITY): Payer: Managed Care, Other (non HMO)

## 2014-01-23 ENCOUNTER — Encounter (HOSPITAL_COMMUNITY): Payer: Self-pay | Admitting: General Surgery

## 2014-01-23 DIAGNOSIS — K922 Gastrointestinal hemorrhage, unspecified: Secondary | ICD-10-CM

## 2014-01-23 DIAGNOSIS — K625 Hemorrhage of anus and rectum: Secondary | ICD-10-CM

## 2014-01-23 DIAGNOSIS — E785 Hyperlipidemia, unspecified: Secondary | ICD-10-CM | POA: Diagnosis present

## 2014-01-23 DIAGNOSIS — D62 Acute posthemorrhagic anemia: Secondary | ICD-10-CM | POA: Diagnosis present

## 2014-01-23 DIAGNOSIS — K5730 Diverticulosis of large intestine without perforation or abscess without bleeding: Secondary | ICD-10-CM

## 2014-01-23 DIAGNOSIS — N400 Enlarged prostate without lower urinary tract symptoms: Secondary | ICD-10-CM

## 2014-01-23 LAB — BASIC METABOLIC PANEL
BUN: 12 mg/dL (ref 6–23)
BUN: 13 mg/dL (ref 6–23)
CALCIUM: 9 mg/dL (ref 8.4–10.5)
CALCIUM: 9.1 mg/dL (ref 8.4–10.5)
CHLORIDE: 103 meq/L (ref 96–112)
CO2: 26 mEq/L (ref 19–32)
CO2: 28 mEq/L (ref 19–32)
Chloride: 100 mEq/L (ref 96–112)
Creatinine, Ser: 1.07 mg/dL (ref 0.50–1.35)
Creatinine, Ser: 1.14 mg/dL (ref 0.50–1.35)
GFR calc Af Amer: 76 mL/min — ABNORMAL LOW (ref >90)
GFR calc non Af Amer: 66 mL/min — ABNORMAL LOW (ref >90)
GFR, EST AFRICAN AMERICAN: 82 mL/min — AB (ref >90)
GFR, EST NON AFRICAN AMERICAN: 71 mL/min — AB (ref >90)
GLUCOSE: 111 mg/dL — AB (ref 70–99)
Glucose, Bld: 135 mg/dL — ABNORMAL HIGH (ref 70–99)
Potassium: 3.6 mEq/L — ABNORMAL LOW (ref 3.7–5.3)
Potassium: 4.2 mEq/L (ref 3.7–5.3)
SODIUM: 136 meq/L — AB (ref 137–147)
Sodium: 140 mEq/L (ref 137–147)

## 2014-01-23 LAB — CBC
HEMATOCRIT: 42.8 % (ref 39.0–52.0)
Hemoglobin: 14.2 g/dL (ref 13.0–17.0)
MCH: 27.3 pg (ref 26.0–34.0)
MCHC: 33.2 g/dL (ref 30.0–36.0)
MCV: 82.1 fL (ref 78.0–100.0)
PLATELETS: 183 10*3/uL (ref 150–400)
RBC: 5.21 MIL/uL (ref 4.22–5.81)
RDW: 16.8 % — ABNORMAL HIGH (ref 11.5–15.5)
WBC: 7.8 10*3/uL (ref 4.0–10.5)

## 2014-01-23 LAB — HEMOGLOBIN AND HEMATOCRIT, BLOOD
HCT: 41.4 % (ref 39.0–52.0)
HCT: 31 % — ABNORMAL LOW (ref 39.0–52.0)
Hemoglobin: 13.4 g/dL (ref 13.0–17.0)
Hemoglobin: 10.4 g/dL — ABNORMAL LOW (ref 13.0–17.0)

## 2014-01-23 LAB — TYPE AND SCREEN
ABO/RH(D): A POS
Antibody Screen: NEGATIVE

## 2014-01-23 MED ORDER — TECHNETIUM TC 99M-LABELED RED BLOOD CELLS IV KIT
25.0000 | PACK | Freq: Once | INTRAVENOUS | Status: AC | PRN
  Administered 2014-01-23: 25 via INTRAVENOUS

## 2014-01-23 MED ORDER — DARIFENACIN HYDROBROMIDE ER 7.5 MG PO TB24
7.5000 mg | ORAL_TABLET | Freq: Every day | ORAL | Status: DC
  Administered 2014-01-23 – 2014-01-26 (×4): 7.5 mg via ORAL
  Filled 2014-01-23 (×4): qty 1

## 2014-01-23 MED ORDER — LISINOPRIL 20 MG PO TABS
20.0000 mg | ORAL_TABLET | Freq: Every day | ORAL | Status: DC
  Filled 2014-01-23: qty 1

## 2014-01-23 MED ORDER — ATORVASTATIN CALCIUM 10 MG PO TABS
10.0000 mg | ORAL_TABLET | Freq: Every day | ORAL | Status: DC
  Administered 2014-01-23 – 2014-01-26 (×4): 10 mg via ORAL
  Filled 2014-01-23 (×4): qty 1

## 2014-01-23 MED ORDER — LORATADINE 10 MG PO TABS: 10.0000 mg | ORAL_TABLET | Freq: Every day | ORAL | Status: DC | PRN

## 2014-01-23 MED ORDER — PANTOPRAZOLE SODIUM 40 MG IV SOLR
40.0000 mg | Freq: Two times a day (BID) | INTRAVENOUS | Status: DC
  Administered 2014-01-23: 40 mg via INTRAVENOUS
  Filled 2014-01-23 (×3): qty 40

## 2014-01-23 MED ORDER — HYDROMORPHONE HCL PF 1 MG/ML IJ SOLN: 1.0000 mg | INTRAMUSCULAR | Status: DC | PRN

## 2014-01-23 MED ORDER — ONDANSETRON HCL 4 MG/2ML IJ SOLN: 4.0000 mg | Freq: Four times a day (QID) | INTRAMUSCULAR | Status: DC | PRN

## 2014-01-23 MED ORDER — TESTOSTERONE 50 MG/5GM (1%) TD GEL
5.0000 g | Freq: Every day | TRANSDERMAL | Status: DC
  Administered 2014-01-24 – 2014-01-25 (×2): 5 g via TRANSDERMAL
  Filled 2014-01-23 (×2): qty 5

## 2014-01-23 MED ORDER — LISINOPRIL-HYDROCHLOROTHIAZIDE 20-12.5 MG PO TABS: 1.0000 | ORAL_TABLET | Freq: Every day | ORAL | Status: DC

## 2014-01-23 MED ORDER — HYDROCHLOROTHIAZIDE 12.5 MG PO CAPS
12.5000 mg | ORAL_CAPSULE | Freq: Every day | ORAL | Status: DC
  Filled 2014-01-23: qty 1

## 2014-01-23 MED ORDER — SODIUM CHLORIDE 0.9 % IV SOLN
INTRAVENOUS | Status: DC
  Administered 2014-01-23 – 2014-01-25 (×6): via INTRAVENOUS

## 2014-01-23 MED ORDER — SODIUM CHLORIDE 0.9 % IV BOLUS (SEPSIS)
500.0000 mL | Freq: Once | INTRAVENOUS | Status: AC
Start: 2014-01-23 — End: 2014-01-23
  Administered 2014-01-23: 500 mL via INTRAVENOUS

## 2014-01-23 MED ORDER — ONDANSETRON HCL 4 MG PO TABS: 4.0000 mg | ORAL_TABLET | Freq: Four times a day (QID) | ORAL | Status: DC | PRN

## 2014-01-23 MED ORDER — POTASSIUM CHLORIDE CRYS ER 20 MEQ PO TBCR
40.0000 meq | EXTENDED_RELEASE_TABLET | Freq: Once | ORAL | Status: AC
Start: 2014-01-23 — End: 2014-01-23
  Administered 2014-01-23: 40 meq via ORAL
  Filled 2014-01-23: qty 2

## 2014-01-23 MED ORDER — HYDROCODONE-ACETAMINOPHEN 5-325 MG PO TABS: 1.0000 | ORAL_TABLET | ORAL | Status: DC | PRN

## 2014-01-23 MED ORDER — TAMSULOSIN HCL 0.4 MG PO CAPS
0.4000 mg | ORAL_CAPSULE | Freq: Every morning | ORAL | Status: DC
  Administered 2014-01-23 – 2014-01-26 (×4): 0.4 mg via ORAL
  Filled 2014-01-23 (×4): qty 1

## 2014-01-23 MED ORDER — TESTOSTERONE 20.25 MG/1.25GM (1.62%) TD GEL: 3.0000 | Freq: Every day | TRANSDERMAL | Status: DC

## 2014-01-23 NOTE — ED Provider Notes (Signed)
Medical screening examination/treatment/procedure(s) were performed by non-physician practitioner and as supervising physician I was immediately available for consultation/collaboration.      Julianne Rice, MD 01/23/14 [phone]

## 2014-01-23 NOTE — Progress Notes (Addendum)
Current and old chart reviewed.   [facility] PROGRESS NOTE  Oran Dillenburg [place] ZDG:[id number] DOB: 1946-04-17 DOA: 01/22/2014 PCP: Gara Kroner, MD  Assessment/Plan:  Principal Problem:   Lower GI bleed, recurrent (4th episode) likely diverticular. See below Active Problems:   Diverticulosis of colon with hemorrhage:  Last colonoscopy 10/14 showed diverticulum, multiple, confined to the  left colon. The most proximal aspect of the diverticulosis was proximal descending colon. There was one polyp in the descending colon, this was sessile, 40mm across, removed with cold snare and sent to pathology. The examination was otherwise normal.  He recommended referral to surgery for elective left hemicolectomy. This is his 4th bleed. Will order stat bleeding scan and consult surgery. Discussed with Dr. Fuller Plan who agrees with plan. No need for GI consult, since he was just scoped a few months ago.  Monitor H/H. HD stable. NPO for now    ADENOMATOUS COLONIC POLYP    HTN (hypertension): hold antihypertensives for now. IVF    Other and unspecified hyperlipidemia    BPH (benign prostatic hypertrophy)   Code Status:  full Family Communication:   Disposition Plan:  home  Consultants:  General surgery  Procedures:     Antibiotics:    HPI/Subjective: Has had several large BRBPR since admission. Feels a little weak/dizzy. No pain or nausea  Objective: Filed Vitals:   01/23/14 0837  BP: 132/90  Pulse: 83  Temp:   Resp:     Intake/Output Summary (Last 24 hours) at 01/23/14 0853 Last data filed at 01/23/14 5956  Gross per 24 hour  Intake      0 ml  Output      3 ml  Net     -3 ml   There were no vitals filed for this visit.  Exam:   General:  Comfortable seated at side of bed.  HEENT: conjunctiva pink. No scleral icterus  Cardiovascular: RRR without MGR  Respiratory: CTA without WRR  Abdomen: S, NT, ND  Ext: No CCE  Basic Metabolic Panel:  Recent Labs Lab  01/22/14 2314 01/23/14 0340  NA 136* 140  K 3.6* 4.2  CL 100 103  CO2 26 28  GLUCOSE 135* 111*  BUN 12 13  CREATININE 1.07 1.14  CALCIUM 9.0 9.1   Liver Function Tests: No results found for this basename: AST, ALT, ALKPHOS, BILITOT, PROT, ALBUMIN,  in the last 168 hours No results found for this basename: LIPASE, AMYLASE,  in the last 168 hours No results found for this basename: AMMONIA,  in the last 168 hours CBC:  Recent Labs Lab 01/22/14 2314 01/23/14 0340  WBC 6.6 7.8  NEUTROABS 4.0  --   HGB 14.7 14.2  HCT 43.2 42.8  MCV 81.8 82.1  PLT 207 183   Cardiac Enzymes: No results found for this basename: CKTOTAL, CKMB, CKMBINDEX, TROPONINI,  in the last 168 hours BNP (last 3 results) No results found for this basename: PROBNP,  in the last 8760 hours CBG: No results found for this basename: GLUCAP,  in the last 168 hours  No results found for this or any previous visit (from the past 240 hour(s)).   Studies: No results found.  Scheduled Meds: . atorvastatin  10 mg Oral Daily  . darifenacin  7.5 mg Oral Daily  . hydrochlorothiazide  12.5 mg Oral Daily  . lisinopril  20 mg Oral Daily  . pantoprazole (PROTONIX) IV  40 mg Intravenous Q12H  . tamsulosin  0.4 mg Oral q morning -  10a  . testosterone  5 g Transdermal Daily   Continuous Infusions: . sodium chloride 50 mL/hr at 01/23/14 0210    Time spent: 60 minutes  [facility] Pager [phone]. If 7PM-7AM, please contact night-coverage at [email or web address], password [facility] 01/23/2014, 8:53 AM  LOS: 1 day

## 2014-01-23 NOTE — H&P (Signed)
[facility] History and Physical  [name] FTD:[id number] DOB: 07-15-1946 DOA: 01/22/2014  Referring physician: ED physician PCP: Gara Kroner, MD   Chief Complaint: blood in stool   HPI:  Patient is a 68 year old male with a history of hypertension, hyperlipidemia, diverticula with diverticular bleeding who presented to [facility] ED with main concern of sudden onset of bright red blood per rectum with some dark blood. Pt was worried as he has had similar events in the past and has required blood transfusion. He denies any specific symptoms such as abdominal pain, no urinary concerns, no shortness of breath, no chest pain, no lightheadedness or dizziness. No fevers, chills, other systemic concerns.   In ED, pt found to be hemodynamically stable, no bleeding since arrival to ED, [facility] asked to admit for observation.   Assessment and Plan: Active Problems: Acute blood loss anemia in the setting or BRBPR - possibly diverticular in etiology as pt has had in the past - Hg is currently stable at 14.7, FOBT in ED was positive  - will admit for serial H/H, consider GI consult in AM if indicated - pt sees Dr. Ardis Hughs in an outpatient setting  HTN - continue home medical regimen HLD - continue statin Hypokalemia - supplement and repeat BMP in AM   Radiological Exams on Admission: No results found.   Code Status: Full Family Communication: Pt at bedside Disposition Plan: Admit for further evaluation     Review of Systems:  Constitutional: Negative for fever, chills. Negative for diaphoresis.  HENT: Negative for hearing loss, ear pain, nosebleeds, congestion, sore throat, neck pain, tinnitus and ear discharge.   Eyes: Negative for blurred vision, double vision, photophobia, pain, discharge and redness.  Respiratory: Negative for cough, hemoptysis, sputum production, shortness of breath, wheezing and stridor.   Cardiovascular: Negative for chest pain, palpitations, orthopnea,  claudication and leg swelling.  Gastrointestinal: Per HPI Genitourinary: Negative for dysuria, urgency, frequency, hematuria and flank pain.  Musculoskeletal: Negative for myalgias, back pain, joint pain and falls.  Skin: Negative for itching and rash.  Neurological: Negative for tingling, tremors, sensory change, speech change, focal weakness, loss of consciousness and headaches.  Endo/Heme/Allergies: Negative for environmental allergies and polydipsia. Does not bruise/bleed easily.  Psychiatric/Behavioral: Negative for suicidal ideas. The patient is not nervous/anxious.      Past Medical History  Diagnosis Date  . Inflammatory polyps of colon   . Pneumothorax 2009    spontaneous, treated with chest tube.  Dr Roxy Horseman.  Marland Kitchen Hx of adenomatous colonic polyps 2007  . Hemorrhage of colon following colonoscopy 2007    post polypectomy bleed 2007, treated with endo clip and epi injection.  . Acute blood loss anemia 2007, 2010    from GI bleeds, required 2 PRBCs each episode.   . Glaucoma   . HLD (hyperlipidemia)   . HTN (hypertension)   . BPH (benign prostatic hyperplasia)   . ED (erectile dysfunction)     Past Surgical History  Procedure Laterality Date  . Tonsillectomy    . Hernia repair      Social History:  reports that he has never smoked. His smokeless tobacco use includes Chew. He reports that he drinks alcohol. He reports that he does not use illicit drugs.  Allergies  Allergen Reactions  . Penicillins Itching and Rash    Family History  Problem Relation Age of Onset  . Heart disease Mother   . Diabetes Mother   . Hypertension Mother   . Cancer Father  type unknown, mets    Prior to Admission medications   Medication Sig Start Date End Date Taking? Authorizing Provider  atorvastatin (LIPITOR) 20 MG tablet Take 10 mg by mouth daily.    Yes Historical Provider, MD  lisinopril-hydrochlorothiazide (PRINZIDE,ZESTORETIC) 20-12.5 MG per tablet Take 1 tablet by mouth  daily.  10/09/12  Yes Historical Provider, MD  loratadine (CLARITIN) 10 MG tablet Take 10 mg by mouth daily as needed for allergies.   Yes Historical Provider, MD  OVER THE COUNTER MEDICATION Take 3 capsules by mouth every evening. *otc male enhancement capsule Zytenz*   Yes Historical Provider, MD  Tamsulosin HCl (FLOMAX) 0.4 MG CAPS Take 0.4 mg by mouth every morning.    Yes Historical Provider, MD  Testosterone (ANDROGEL) 20.25 MG/1.25GM (1.62%) GEL Place 3 Squirts onto the skin daily.    Yes Historical Provider, MD  VESICARE 5 MG tablet Take 5 mg by mouth daily.  10/11/12  Yes Historical Provider, MD    Physical Exam: Filed Vitals:   01/22/14 2350 01/22/14 2352 01/22/14 2356 01/23/14 0000  BP: 146/83 148/97 149/91 147/94  Pulse: 76 79 87 70  Temp:      TempSrc:      Resp:    18  SpO2:    99%    Physical Exam  Constitutional: Appears well-developed and well-nourished. No distress.  HENT: Normocephalic. External right and left ear normal. Oropharynx is clear and moist.  Eyes: Conjunctivae and EOM are normal. PERRLA, no scleral icterus.  Neck: Normal ROM. Neck supple. No JVD. No tracheal deviation. No thyromegaly.  CVS: RRR, S1/S2 +, no murmurs, no gallops, no carotid bruit.  Pulmonary: Effort and breath sounds normal, no stridor, rhonchi, wheezes, rales.  Abdominal: Soft. BS +,  no distension, tenderness, rebound or guarding.  Musculoskeletal: Normal range of motion. No edema and no tenderness.  Lymphadenopathy: No lymphadenopathy noted, cervical, inguinal. Neuro: Alert. Normal reflexes, muscle tone coordination. No cranial nerve deficit. Skin: Skin is warm and dry. No rash noted. Not diaphoretic. No erythema. No pallor.  Psychiatric: Normal mood and affect. Behavior, judgment, thought content normal.   Labs on Admission:  Basic Metabolic Panel:  Recent Labs Lab 01/22/14 2314  NA 136*  K 3.6*  CL 100  CO2 26  GLUCOSE 135*  BUN 12  CREATININE 1.07  CALCIUM 9.0   Liver  Function Tests: No results found for this basename: AST, ALT, ALKPHOS, BILITOT, PROT, ALBUMIN,  in the last 168 hours No results found for this basename: LIPASE, AMYLASE,  in the last 168 hours No results found for this basename: AMMONIA,  in the last 168 hours CBC:  Recent Labs Lab 01/22/14 2314  WBC 6.6  NEUTROABS 4.0  HGB 14.7  HCT 43.2  MCV 81.8  PLT 207    EKG: Normal sinus rhythm, no ST/T wave changes  Faye Ramsay, MD  [facility] Pager [phone]  If 7PM-7AM, please contact night-coverage [email or web address] Password [facility] 01/23/2014, 12:33 AM

## 2014-01-23 NOTE — Consult Note (Signed)
[name] [name] May 27, 1946  [id number].    Requesting MD: Dr. Conley Canal Chief Complaint/Reason for Consult: Lower GI bleed  HPI:  [name] is a 68 y.o. male with hx of HTN, HLD, lower GI bleeding (x3)  who presents acute BRBPR since 9pm on 01/22/14.  Pt reports having a bowel movement on 01/22/14 that was normal, but he continued to have the urge to defecate.  He had a second loose bowel movement that consisted of a large amount of bright red blood.  Pt has had 3 similar episodes in the past, 2 of which required a blood transfusion.  He is followed by Dr. Ardis Hughs with [facility].  He has had several small polyps removed from his colon but were benign (08/2013).  He denies recent illness.  He denies SOB, weakness, malaise, dizziness, abdominal pain, rectal pain, fever, chills, sweats, or N/V.  No radiating pain, no precipitating/alleviating factors.  His H&H remains normal despite him continuing to have dark red/black bowel movement.  He just returned from his tagged RBC's which was positive for active bleeding in the LLQ.    [place] includes a laparoscopic umbilical hernia repair in 2013 by Dr. Rosendo Gros.  No other abdominal surgeries or hernias.  No history of diverticulitis, colon cancer, or inflammatory bowel disease.  ROS: All systems reviewed and otherwise negative except for as above  Family History  Problem Relation Age of Onset  . Heart disease Mother   . Diabetes Mother   . Hypertension Mother   . Cancer Father     type unknown, mets    Past Medical History  Diagnosis Date  . Inflammatory polyps of colon   . Pneumothorax 2009    spontaneous, treated with chest tube.  Dr Roxy Horseman.  Marland Kitchen Hx of adenomatous colonic polyps 2007  . Hemorrhage of colon following colonoscopy 2007    post polypectomy bleed 2007, treated with endo clip and epi injection.  . Acute blood loss anemia 2007, 2010    from GI bleeds, required 2 PRBCs each episode.   . Glaucoma   . HLD (hyperlipidemia)   . HTN  (hypertension)   . BPH (benign prostatic hyperplasia)   . ED (erectile dysfunction)     Past Surgical History  Procedure Laterality Date  . Tonsillectomy    . Hernia repair      Social History:  reports that he has never smoked. His smokeless tobacco use includes Chew. He reports that he drinks alcohol. He reports that he does not use illicit drugs.  Allergies:  Allergies  Allergen Reactions  . Penicillins Itching and Rash    Medications Prior to Admission  Medication Sig Dispense Refill  . atorvastatin (LIPITOR) 20 MG tablet Take 10 mg by mouth daily.       Marland Kitchen lisinopril-hydrochlorothiazide (PRINZIDE,ZESTORETIC) 20-12.5 MG per tablet Take 1 tablet by mouth daily.       Marland Kitchen loratadine (CLARITIN) 10 MG tablet Take 10 mg by mouth daily as needed for allergies.      Marland Kitchen OVER THE COUNTER MEDICATION Take 3 capsules by mouth every evening. *otc male enhancement capsule Zytenz*      . Tamsulosin HCl (FLOMAX) 0.4 MG CAPS Take 0.4 mg by mouth every morning.       . Testosterone (ANDROGEL) 20.25 MG/1.25GM (1.62%) GEL Place 3 Squirts onto the skin daily.       . VESICARE 5 MG tablet Take 5 mg by mouth daily.         Blood pressure  144/93, pulse 78, temperature 98.5 F (36.9 C), temperature source Oral, resp. rate 20, SpO2 100.00%. Physical Exam: General: pleasant, WD/WN AA male who is laying in bed in NAD HEENT: head is normocephalic, atraumatic.  Sclera are noninjected.  PERRL.  Ears and nose without any masses or lesions.  Mouth is pink and moist Heart: regular, rate, and rhythm.  No obvious murmurs, gallops, or rubs noted.  Palpable pedal pulses bilaterally Lungs: CTAB, no wheezes, rhonchi, or rales noted.  Respiratory effort nonlabored Abd: soft, NT/ND, +BS, no masses, hernias, or organomegaly, no incisional scars visible at umbilicus, small 57m periumbilical hernia defect at 11 o'clock palpated without reoccurance MS: all 4 extremities are symmetrical with no cyanosis, clubbing, or  edema. Skin: warm and dry with no masses, lesions, or rashes Psych: A&Ox3 with an appropriate affect.   Results for orders placed during the hospital encounter of 01/22/14 (from the past 48 hour(s))  POC OCCULT BLOOD, ED     Status: Abnormal   Collection Time    01/22/14 11:04 PM      Result Value Ref Range   Fecal Occult Bld POSITIVE (*) NEGATIVE  CBC WITH DIFFERENTIAL     Status: Abnormal   Collection Time    01/22/14 11:14 PM      Result Value Ref Range   WBC 6.6  4.0 - 10.5 K/uL   RBC 5.28  4.22 - 5.81 MIL/uL   Hemoglobin 14.7  13.0 - 17.0 g/dL   HCT 43.2  39.0 - 52.0 %   MCV 81.8  78.0 - 100.0 fL   MCH 27.8  26.0 - 34.0 pg   MCHC 34.0  30.0 - 36.0 g/dL   RDW 16.6 (*) 11.5 - 15.5 %   Platelets 207  150 - 400 K/uL   Neutrophils Relative % 61  43 - 77 %   Neutro Abs 4.0  1.7 - 7.7 K/uL   Lymphocytes Relative 28  12 - 46 %   Lymphs Abs 1.8  0.7 - 4.0 K/uL   Monocytes Relative 8  3 - 12 %   Monocytes Absolute 0.5  0.1 - 1.0 K/uL   Eosinophils Relative 3  0 - 5 %   Eosinophils Absolute 0.2  0.0 - 0.7 K/uL   Basophils Relative 0  0 - 1 %   Basophils Absolute 0.0  0.0 - 0.1 K/uL  BASIC METABOLIC PANEL     Status: Abnormal   Collection Time    01/22/14 11:14 PM      Result Value Ref Range   Sodium 136 (*) 137 - 147 mEq/L   Potassium 3.6 (*) 3.7 - 5.3 mEq/L   Chloride 100  96 - 112 mEq/L   CO2 26  19 - 32 mEq/L   Glucose, Bld 135 (*) 70 - 99 mg/dL   BUN 12  6 - 23 mg/dL   Creatinine, Ser 1.07  0.50 - 1.35 mg/dL   Calcium 9.0  8.4 - 10.5 mg/dL   GFR calc non Af Amer 71 (*) >90 mL/min   GFR calc Af Amer 82 (*) >90 mL/min   Comment: (NOTE)     The eGFR has been calculated using the CKD EPI equation.     This calculation has not been validated in all clinical situations.     eGFR's persistently <90 mL/min signify possible Chronic Kidney     Disease.  TYPE AND SCREEN     Status: None   Collection Time    01/22/14 11:14 PM  Result Value Ref Range   ABO/RH(D) A POS      Antibody Screen NEG     Sample Expiration 86/38/1771    BASIC METABOLIC PANEL     Status: Abnormal   Collection Time    01/23/14  3:40 AM      Result Value Ref Range   Sodium 140  137 - 147 mEq/L   Potassium 4.2  3.7 - 5.3 mEq/L   Chloride 103  96 - 112 mEq/L   CO2 28  19 - 32 mEq/L   Glucose, Bld 111 (*) 70 - 99 mg/dL   BUN 13  6 - 23 mg/dL   Creatinine, Ser 1.14  0.50 - 1.35 mg/dL   Calcium 9.1  8.4 - 10.5 mg/dL   GFR calc non Af Amer 66 (*) >90 mL/min   GFR calc Af Amer 76 (*) >90 mL/min   Comment: (NOTE)     The eGFR has been calculated using the CKD EPI equation.     This calculation has not been validated in all clinical situations.     eGFR's persistently <90 mL/min signify possible Chronic Kidney     Disease.  CBC     Status: Abnormal   Collection Time    01/23/14  3:40 AM      Result Value Ref Range   WBC 7.8  4.0 - 10.5 K/uL   RBC 5.21  4.22 - 5.81 MIL/uL   Hemoglobin 14.2  13.0 - 17.0 g/dL   HCT 42.8  39.0 - 52.0 %   MCV 82.1  78.0 - 100.0 fL   MCH 27.3  26.0 - 34.0 pg   MCHC 33.2  30.0 - 36.0 g/dL   RDW 16.8 (*) 11.5 - 15.5 %   Platelets 183  150 - 400 K/uL   Nm Gi Blood Loss  01/23/2014   CLINICAL DATA:  GI bleed.  EXAM: NUCLEAR MEDICINE GASTROINTESTINAL BLEEDING SCAN  TECHNIQUE: Sequential abdominal images were obtained following intravenous administration of Tc-29mlabeled red blood cells.  COMPARISON:  None.  RADIOPHARMACEUTICALS:  25 mCi Tc-91mn-vitro labeled red cells.  FINDINGS: There is abnormal accumulation of radiotracer within the left mid abdomen immediately at the beginning of the study. There is progressive accumulation of radiotracer in the left mid abdomen coursing inferomedially with the configuration of a loop of bowel. This progresses both slightly retrograde into the left upper quadrant and predominantly antegrade towards the central/lower abdomen with imaging carried out to 60 min.  IMPRESSION: Positive study with active GI bleeding in the  left mid to lower abdomen. This likely originates in the distal descending colon.  Critical Value/emergent results were called by telephone at the time of interpretation on 01/23/2014 at 12:42 PM to Dr. CODoree Barthel who verbally acknowledged these results.   Electronically Signed   By: AlLogan Bores On: 01/23/2014 12:42      Assessment/Plan BRBPR Lower GI bleed (4th episode) Diverticulosis H/o adenomatous polyps HLD/HTN BPH Glaucoma   Plan: 1.  Will treat conservatively with serial exams, CBC/H&H's, clear liquids and monitor blood per rectum.  Hopefully it will stop on its own like it has in the past 2.  The patient would like to avoid surgery at this time because of just starting this job and not having any vacation time 3.  His Tagged RBC scan was positive and showed active bleeding in the LLQ most likely the distal descending colon.  He knows he will eventually need a left colon  resection or this will continue to happen if he decides not to have surgery. 4.  Ambulate and IS 5.  SCD's and hold all anticoagulation! 6.  Discussed the plan with the patient and his wife at bedside and they are in agreeable.  Will follow.    LOS: 1 day    Coralie Keens, Hershal Coria  01/23/2014, 12:21 PM [facility] Phone #: [phone]

## 2014-01-23 NOTE — Consult Note (Signed)
I personally spoke with the patient and his wife.  This is now his fourth episode of diverticular bleeding, confirmed today by tagged RBC study.  He is hemodynamically stable and does not need transfusion at this time.  He is hesitant to undergo urgent left hemicolectomy because he just started a new job.  We will begin clear liquids today and check serial hemoglobin levels.  If it appears that his bleeding has stopped, he may follow-up as an outpatient for possible elective left hemicolectomy.  If the bleeding seems to be ongoing, consider bowel prep and left hemicolectomy with anastomosis during this admission.  I discussed with Dr. Conley Canal.  Imogene Burn. Georgette Dover, MD, [facility] Surgery  General/ Trauma Surgery  01/23/2014 2:48 PM

## 2014-01-24 DIAGNOSIS — I959 Hypotension, unspecified: Secondary | ICD-10-CM | POA: Diagnosis not present

## 2014-01-24 LAB — CBC
HCT: 29 % — ABNORMAL LOW (ref 39.0–52.0)
HEMATOCRIT: 28.9 % — AB (ref 39.0–52.0)
HEMOGLOBIN: 9.6 g/dL — AB (ref 13.0–17.0)
HEMOGLOBIN: 9.8 g/dL — AB (ref 13.0–17.0)
MCH: 27.4 pg (ref 26.0–34.0)
MCH: 28.1 pg (ref 26.0–34.0)
MCHC: 33.1 g/dL (ref 30.0–36.0)
MCHC: 33.9 g/dL (ref 30.0–36.0)
MCV: 82.6 fL (ref 78.0–100.0)
MCV: 82.8 fL (ref 78.0–100.0)
PLATELETS: 176 10*3/uL (ref 150–400)
Platelets: 162 10*3/uL (ref 150–400)
RBC: 3.51 MIL/uL — ABNORMAL LOW (ref 4.22–5.81)
RBC: 3.49 MIL/uL — AB (ref 4.22–5.81)
RDW: 17 % — ABNORMAL HIGH (ref 11.5–15.5)
RDW: 17.1 % — ABNORMAL HIGH (ref 11.5–15.5)
WBC: 8.8 10*3/uL (ref 4.0–10.5)
WBC: 8.3 10*3/uL (ref 4.0–10.5)

## 2014-01-24 NOTE — Progress Notes (Addendum)
Pt complained of dizziness, lightheadness, and stated he felt like his head was swimming. Manual BP was 82/52 and HR 110. MD notified. NS 592mL bolus given over an hour. Pt denies shortness of breath, chest pain, or palpitations. Will continue to monitor for signs and symptoms of hypotension.

## 2014-01-24 NOTE — Progress Notes (Signed)
  Subjective: Patient had some orthostatic hypotension last night. Feels better today. Had a BM this morning that had no gross blood. Hungry  Objective: Vital signs in last 24 hours: Temp:  [98 F (36.7 C)-98.6 F (37 C)] 98 F (36.7 C) (02/28 0601) Pulse Rate:  [78-116] 81 (02/28 0601) Resp:  [17-20] 17 (02/28 0601) BP: (82-144)/(52-93) 108/64 mmHg (02/28 0628) SpO2:  [99 %-100 %] 99 % (02/28 0601) Weight:  [230 lb (104.327 kg)] 230 lb (104.327 kg) (02/28 0838) Last BM Date: 01/23/14  Intake/Output from previous day: 02/27 0701 - 02/28 0700 In: 2166.7 [P.O.:960; I.V.:1206.7] Out: 105 [Urine:2; Stool:103] Intake/Output this shift:    GI: soft, non-tender; bowel sounds normal; no masses,  no organomegaly  Lab Results:   Recent Labs  01/23/14 0340  01/23/14 2250 01/24/14 0351  WBC 7.8  --   --  8.8  HGB 14.2  < > 10.4* 9.6*  HCT 42.8  < > 31.0* 29.0*  PLT 183  --   --  176  < > = values in this interval not displayed. BMET  Recent Labs  01/22/14 2314 01/23/14 0340  NA 136* 140  K 3.6* 4.2  CL 100 103  CO2 26 28  GLUCOSE 135* 111*  BUN 12 13  CREATININE 1.07 1.14  CALCIUM 9.0 9.1   PT/INR No results found for this basename: LABPROT, INR,  in the last 72 hours ABG No results found for this basename: PHART, PCO2, PO2, HCO3,  in the last 72 hours  Studies/Results: Nm Gi Blood Loss  01/23/2014   CLINICAL DATA:  GI bleed.  EXAM: NUCLEAR MEDICINE GASTROINTESTINAL BLEEDING SCAN  TECHNIQUE: Sequential abdominal images were obtained following intravenous administration of Tc-62m labeled red blood cells.  COMPARISON:  None.  RADIOPHARMACEUTICALS:  25 mCi Tc-30m in-vitro labeled red cells.  FINDINGS: There is abnormal accumulation of radiotracer within the left mid abdomen immediately at the beginning of the study. There is progressive accumulation of radiotracer in the left mid abdomen coursing inferomedially with the configuration of a loop of bowel. This  progresses both slightly retrograde into the left upper quadrant and predominantly antegrade towards the central/lower abdomen with imaging carried out to 60 min.  IMPRESSION: Positive study with active GI bleeding in the left mid to lower abdomen. This likely originates in the distal descending colon.  Critical Value/emergent results were called by telephone at the time of interpretation on 01/23/2014 at 12:42 PM to Dr. Doree Barthel , who verbally acknowledged these results.   Electronically Signed   By: Logan Bores   On: 01/23/2014 12:42    Anti-infectives: Anti-infectives   None      Assessment/Plan: s/p * No surgery found * Advance diet Recheck Hgb level this afternoon and tomorrow. If stable, will discharge home to follow-up with Dr. Rosendo Gros as an outpatient to discuss elective left hemicolectomy.   LOS: 2 days    Jemeka Wagler K. 01/24/2014

## 2014-01-24 NOTE — Progress Notes (Signed)
[facility] PROGRESS NOTE  Lorn Butcher Yoshida TGG:[id number] DOB: 1946-01-22 DOA: 01/22/2014 PCP: Gara Kroner, MD  Assessment/Plan:  Principal Problem:   Lower GI bleed: BRBPR continues, but less voluminous and less frequent.  Hgb dropped 5 g/dL. Continue serial H/H, ordered for now and bid. Transfuse PRN. Diet per surgery Active Problems:   Hypotension, unspecified last evening. Resolved after 500 cc saline bolus.  No dizziness currently. meds held. See above.   Acute blood loss anemia   Diverticulosis of colon with hemorrhage, recurrent. Pt does not want surgery unless life threatening bleed   ADENOMATOUS COLONIC POLYP   HTN (hypertension)   Other and unspecified hyperlipidemia   BPH (benign prostatic hypertrophy)  Appreciated surgery Code Status:  full Family Communication:  Wife at bedside Disposition Plan:  home  Consultants:  CCS  Procedures:     Antibiotics:    HPI/Subjective: Just had BRBPR, not as voluminous as previous. Last bloody stool at around midnight. No dizziness, cp, sob. Wants diet advanced  Objective: Filed Vitals:   01/24/14 0628  BP: 108/64  Pulse:   Temp:   Resp:     Intake/Output Summary (Last 24 hours) at 01/24/14 0902 Last data filed at 01/23/14 2200  Gross per 24 hour  Intake 2166.67 ml  Output    102 ml  Net 2064.67 ml   Filed Weights   01/24/14 0838  Weight: 104.327 kg (230 lb)    Exam:   General:  Walking out of bathroom.  Red blood in commode  HEENT. Slightly pale conjunctiva  Cardiovascular: RRR without MGR  Respiratory: CTA without WRR  Abdomen: s, nt, nd  Ext: no cce  Basic Metabolic Panel:  Recent Labs Lab 01/22/14 2314 01/23/14 0340  NA 136* 140  K 3.6* 4.2  CL 100 103  CO2 26 28  GLUCOSE 135* 111*  BUN 12 13  CREATININE 1.07 1.14  CALCIUM 9.0 9.1   Liver Function Tests: No results found for this basename: AST, ALT, ALKPHOS, BILITOT, PROT, ALBUMIN,  in the last 168 hours No results  found for this basename: LIPASE, AMYLASE,  in the last 168 hours No results found for this basename: AMMONIA,  in the last 168 hours CBC:  Recent Labs Lab 01/22/14 2314 01/23/14 0340 01/23/14 1415 01/23/14 2250 01/24/14 0351  WBC 6.6 7.8  --   --  8.8  NEUTROABS 4.0  --   --   --   --   HGB 14.7 14.2 13.4 10.4* 9.6*  HCT 43.2 42.8 41.4 31.0* 29.0*  MCV 81.8 82.1  --   --  82.6  PLT 207 183  --   --  176   Cardiac Enzymes: No results found for this basename: CKTOTAL, CKMB, CKMBINDEX, TROPONINI,  in the last 168 hours BNP (last 3 results) No results found for this basename: PROBNP,  in the last 8760 hours CBG: No results found for this basename: GLUCAP,  in the last 168 hours  No results found for this or any previous visit (from the past 240 hour(s)).   Studies: Nm Gi Blood Loss  01/23/2014   CLINICAL DATA:  GI bleed.  EXAM: NUCLEAR MEDICINE GASTROINTESTINAL BLEEDING SCAN  TECHNIQUE: Sequential abdominal images were obtained following intravenous administration of Tc-75m labeled red blood cells.  COMPARISON:  None.  RADIOPHARMACEUTICALS:  25 mCi Tc-57m in-vitro labeled red cells.  FINDINGS: There is abnormal accumulation of radiotracer within the left mid abdomen immediately at the beginning of the study. There is progressive accumulation of  radiotracer in the left mid abdomen coursing inferomedially with the configuration of a loop of bowel. This progresses both slightly retrograde into the left upper quadrant and predominantly antegrade towards the central/lower abdomen with imaging carried out to 60 min.  IMPRESSION: Positive study with active GI bleeding in the left mid to lower abdomen. This likely originates in the distal descending colon.  Critical Value/emergent results were called by telephone at the time of interpretation on 01/23/2014 at 12:42 PM to Dr. Doree Barthel , who verbally acknowledged these results.   Electronically Signed   By: Logan Bores   On: 01/23/2014 12:42     Scheduled Meds: . atorvastatin  10 mg Oral Daily  . darifenacin  7.5 mg Oral Daily  . tamsulosin  0.4 mg Oral q morning - 10a  . testosterone  5 g Transdermal Daily   Continuous Infusions: . sodium chloride 100 mL/hr at 01/24/14 0018    Time spent: 25 minutes  [facility] Pager [phone]. If 7PM-7AM, please contact night-coverage at [email or web address], password [facility] 01/24/2014, 9:02 AM  LOS: 2 days

## 2014-01-25 DIAGNOSIS — I10 Essential (primary) hypertension: Secondary | ICD-10-CM

## 2014-01-25 DIAGNOSIS — D62 Acute posthemorrhagic anemia: Secondary | ICD-10-CM

## 2014-01-25 LAB — BASIC METABOLIC PANEL
BUN: 9 mg/dL (ref 6–23)
CO2: 24 mEq/L (ref 19–32)
CREATININE: 1.06 mg/dL (ref 0.50–1.35)
Calcium: 8.1 mg/dL — ABNORMAL LOW (ref 8.4–10.5)
Chloride: 107 mEq/L (ref 96–112)
GFR, EST AFRICAN AMERICAN: 83 mL/min — AB (ref >90)
GFR, EST NON AFRICAN AMERICAN: 72 mL/min — AB (ref >90)
GLUCOSE: 114 mg/dL — AB (ref 70–99)
Potassium: 4.2 mEq/L (ref 3.7–5.3)
Sodium: 141 mEq/L (ref 137–147)

## 2014-01-25 LAB — CBC
HEMATOCRIT: 24.9 % — AB (ref 39.0–52.0)
Hemoglobin: 8.2 g/dL — ABNORMAL LOW (ref 13.0–17.0)
MCH: 27.3 pg (ref 26.0–34.0)
MCHC: 32.9 g/dL (ref 30.0–36.0)
MCV: 83 fL (ref 78.0–100.0)
Platelets: 170 10*3/uL (ref 150–400)
RBC: 3 MIL/uL — ABNORMAL LOW (ref 4.22–5.81)
RDW: 17.1 % — ABNORMAL HIGH (ref 11.5–15.5)
WBC: 7.3 10*3/uL (ref 4.0–10.5)

## 2014-01-25 LAB — HEMOGLOBIN AND HEMATOCRIT, BLOOD
HCT: 28.2 % — ABNORMAL LOW (ref 39.0–52.0)
Hemoglobin: 9.1 g/dL — ABNORMAL LOW (ref 13.0–17.0)

## 2014-01-25 NOTE — Progress Notes (Signed)
[facility] PROGRESS NOTE  [name] Sami WUJ:[id number] DOB: 1946-11-07 DOA: 01/22/2014 PCP: Gara Kroner, MD  Assessment/Plan:  Principal Problem:   Lower GI bleed: Hgb dropped to 8.2 at 4 am. Repeat 9.1. Pt reports no hematochezia since Friday, but I personally saw blood in commode yesterday morning. Minimizing? Will SL IV, increase activity, repeat CBC in am. Active Problems:   Hypotension, none for >48 hours   Acute blood loss anemia   Diverticulosis of colon with hemorrhage, recurrent. Pt does not want surgery unless life threatening bleed   ADENOMATOUS COLONIC POLYP   HTN (hypertension): meds held   Other and unspecified hyperlipidemia   BPH (benign prostatic hypertrophy)  Appreciated surgery Code Status:  full Family Communication:  Wife at bedside Disposition Plan:  home  Consultants:  CCS  Procedures:     Antibiotics:    HPI/Subjective: Denies recent bleeding. No dizziness. Wants to go home  Objective: Filed Vitals:   01/25/14 0636  BP: 124/74  Pulse: 77  Temp: 98.3 F (36.8 C)  Resp: 17    Intake/Output Summary (Last 24 hours) at 01/25/14 1002 Last data filed at 01/25/14 0900  Gross per 24 hour  Intake   3480 ml  Output      6 ml  Net   3474 ml   Filed Weights   01/24/14 0838  Weight: 104.327 kg (230 lb)    Exam:   General:  In chair  HEENT. Slightly pale conjunctiva  Cardiovascular: RRR without MGR  Respiratory: CTA without WRR  Abdomen: s, nt, nd  Ext: no cce  Basic Metabolic Panel:  Recent Labs Lab 01/22/14 2314 01/23/14 0340 01/25/14 0344  NA 136* 140 141  K 3.6* 4.2 4.2  CL 100 103 107  CO2 26 28 24   GLUCOSE 135* 111* 114*  BUN 12 13 9   CREATININE 1.07 1.14 1.06  CALCIUM 9.0 9.1 8.1*   Liver Function Tests: No results found for this basename: AST, ALT, ALKPHOS, BILITOT, PROT, ALBUMIN,  in the last 168 hours No results found for this basename: LIPASE, AMYLASE,  in the last 168 hours No results found  for this basename: AMMONIA,  in the last 168 hours CBC:  Recent Labs Lab 01/22/14 2314 01/23/14 0340  01/23/14 2250 01/24/14 0351 01/24/14 1305 01/25/14 0344 01/25/14 0829  WBC 6.6 7.8  --   --  8.8 8.3 7.3  --   NEUTROABS 4.0  --   --   --   --   --   --   --   HGB 14.7 14.2  < > 10.4* 9.6* 9.8* 8.2* 9.1*  HCT 43.2 42.8  < > 31.0* 29.0* 28.9* 24.9* 28.2*  MCV 81.8 82.1  --   --  82.6 82.8 83.0  --   PLT 207 183  --   --  176 162 170  --   < > = values in this interval not displayed. Cardiac Enzymes: No results found for this basename: CKTOTAL, CKMB, CKMBINDEX, TROPONINI,  in the last 168 hours BNP (last 3 results) No results found for this basename: PROBNP,  in the last 8760 hours CBG: No results found for this basename: GLUCAP,  in the last 168 hours  No results found for this or any previous visit (from the past 240 hour(s)).   Studies: Nm Gi Blood Loss  01/23/2014   CLINICAL DATA:  GI bleed.  EXAM: NUCLEAR MEDICINE GASTROINTESTINAL BLEEDING SCAN  TECHNIQUE: Sequential abdominal images were obtained following intravenous administration of  Tc-71m labeled red blood cells.  COMPARISON:  None.  RADIOPHARMACEUTICALS:  25 mCi Tc-6m in-vitro labeled red cells.  FINDINGS: There is abnormal accumulation of radiotracer within the left mid abdomen immediately at the beginning of the study. There is progressive accumulation of radiotracer in the left mid abdomen coursing inferomedially with the configuration of a loop of bowel. This progresses both slightly retrograde into the left upper quadrant and predominantly antegrade towards the central/lower abdomen with imaging carried out to 60 min.  IMPRESSION: Positive study with active GI bleeding in the left mid to lower abdomen. This likely originates in the distal descending colon.  Critical Value/emergent results were called by telephone at the time of interpretation on 01/23/2014 at 12:42 PM to Dr. Doree Barthel , who verbally acknowledged  these results.   Electronically Signed   By: Logan Bores   On: 01/23/2014 12:42    Scheduled Meds: . atorvastatin  10 mg Oral Daily  . darifenacin  7.5 mg Oral Daily  . tamsulosin  0.4 mg Oral q morning - 10a  . testosterone  5 g Transdermal Daily   Continuous Infusions:    Time spent: 15 minutes  [facility] Pager [phone]. If 7PM-7AM, please contact night-coverage at [email or web address], password [facility] 01/25/2014, 10:02 AM  LOS: 3 days

## 2014-01-25 NOTE — Progress Notes (Signed)
  Subjective: No complaints Denies dizziness on standing No bloody BM's  Objective: Vital signs in last 24 hours: Temp:  [98.2 F (36.8 C)-98.3 F (36.8 C)] 98.3 F (36.8 C) (03/01 0636) Pulse Rate:  [77-87] 77 (03/01 0636) Resp:  [17-18] 17 (03/01 0636) BP: (124-154)/(66-85) 124/74 mmHg (03/01 0636) SpO2:  [98 %-100 %] 98 % (03/01 0636) Last BM Date: 01/24/14  Intake/Output from previous day: 02/28 0701 - 03/01 0700 In: 3720 [P.O.:1320; I.V.:2400] Out: 6 [Urine:5; Stool:1] Intake/Output this shift:   Non tender abdomen  Lab Results:   Recent Labs  01/24/14 1305 01/25/14 0344  WBC 8.3 7.3  HGB 9.8* 8.2*  HCT 28.9* 24.9*  PLT 162 170   BMET  Recent Labs  01/23/14 0340 01/25/14 0344  NA 140 141  K 4.2 4.2  CL 103 107  CO2 28 24  GLUCOSE 111* 114*  BUN 13 9  CREATININE 1.14 1.06  CALCIUM 9.1 8.1*   PT/INR No results found for this basename: LABPROT, INR,  in the last 72 hours ABG No results found for this basename: PHART, PCO2, PO2, HCO3,  in the last 72 hours  Studies/Results: Nm Gi Blood Loss  01/23/2014   CLINICAL DATA:  GI bleed.  EXAM: NUCLEAR MEDICINE GASTROINTESTINAL BLEEDING SCAN  TECHNIQUE: Sequential abdominal images were obtained following intravenous administration of Tc-75m labeled red blood cells.  COMPARISON:  None.  RADIOPHARMACEUTICALS:  25 mCi Tc-56m in-vitro labeled red cells.  FINDINGS: There is abnormal accumulation of radiotracer within the left mid abdomen immediately at the beginning of the study. There is progressive accumulation of radiotracer in the left mid abdomen coursing inferomedially with the configuration of a loop of bowel. This progresses both slightly retrograde into the left upper quadrant and predominantly antegrade towards the central/lower abdomen with imaging carried out to 60 min.  IMPRESSION: Positive study with active GI bleeding in the left mid to lower abdomen. This likely originates in the distal descending  colon.  Critical Value/emergent results were called by telephone at the time of interpretation on 01/23/2014 at 12:42 PM to Dr. Doree Barthel , who verbally acknowledged these results.   Electronically Signed   By: Logan Bores   On: 01/23/2014 12:42    Anti-infectives: Anti-infectives   None      Assessment/Plan: s/p * No surgery found *  Bleeding diverticular disease  Hgb this am 8.2 down from 9.8.  Another one is pending.  He still may need a partial colectomy if the hgb continues to drop.  LOS: 3 days    [place] Pam A 01/25/2014

## 2014-01-26 LAB — CBC
HEMATOCRIT: 23.1 % — AB (ref 39.0–52.0)
Hemoglobin: 7.8 g/dL — ABNORMAL LOW (ref 13.0–17.0)
MCH: 28 pg (ref 26.0–34.0)
MCHC: 33.8 g/dL (ref 30.0–36.0)
MCV: 82.8 fL (ref 78.0–100.0)
PLATELETS: 170 10*3/uL (ref 150–400)
RBC: 2.79 MIL/uL — ABNORMAL LOW (ref 4.22–5.81)
RDW: 17.3 % — ABNORMAL HIGH (ref 11.5–15.5)
WBC: 7.1 10*3/uL (ref 4.0–10.5)

## 2014-01-26 LAB — HEMOGLOBIN AND HEMATOCRIT, BLOOD
HCT: 26.6 % — ABNORMAL LOW (ref 39.0–52.0)
HCT: 27.3 % — ABNORMAL LOW (ref 39.0–52.0)
HEMOGLOBIN: 9.2 g/dL — AB (ref 13.0–17.0)
Hemoglobin: 8.7 g/dL — ABNORMAL LOW (ref 13.0–17.0)

## 2014-01-26 NOTE — Progress Notes (Signed)
Patient interviewed and examined, agree with PA note above.  Kinberly Perris T Ottis Vacha MD, FACS  01/26/2014 9:59 AM  

## 2014-01-26 NOTE — Progress Notes (Signed)
Pt still having bloody stool. Just had one before lunch. hgb continues to drop. Not stable for discharge. Refusing surgery.  He wants to leave AMA.  Risk of anemia, hypotension, death. Pt voices understanding.  Doree Barthel, M.D. [facility]

## 2014-01-26 NOTE — Progress Notes (Signed)
  Subjective: He feels fine and is adamant about not having surgery at this time.  Objective: Vital signs in last 24 hours: Temp:  [98 F (36.7 C)-98.5 F (36.9 C)] 98.5 F (36.9 C) (03/02 0502) Pulse Rate:  [83-93] 84 (03/02 0502) Resp:  [18-19] 18 (03/02 0502) BP: (121-158)/(73-89) 121/73 mmHg (03/02 0502) SpO2:  [100 %] 100 % (03/02 0502) Last BM Date: 01/25/14 600 PO 1 stool recorded yesterday Afebrile, VSS H/H this AM:  8.7/26.6 Intake/Output from previous day: 03/01 0701 - 03/02 0700 In: 1831.7 [P.O.:600; I.V.:1231.7] Out: -  Intake/Output this shift:    General appearance: alert, cooperative and no distress GI: soft, non-tender; bowel sounds normal; no masses,  no organomegaly  Lab Results:   Recent Labs  01/25/14 0344  01/26/14 0457 01/26/14 0745  WBC 7.3  --  7.1  --   HGB 8.2*  < > 7.8* 8.7*  HCT 24.9*  < > 23.1* 26.6*  PLT 170  --  170  --   < > = values in this interval not displayed.  BMET  Recent Labs  01/25/14 0344  NA 141  K 4.2  CL 107  CO2 24  GLUCOSE 114*  BUN 9  CREATININE 1.06  CALCIUM 8.1*   PT/INR No results found for this basename: LABPROT, INR,  in the last 72 hours  No results found for this basename: AST, ALT, ALKPHOS, BILITOT, PROT, ALBUMIN,  in the last 168 hours   Lipase  No results found for this basename: lipase     Studies/Results: No results found.  Medications: . atorvastatin  10 mg Oral Daily  . darifenacin  7.5 mg Oral Daily  . tamsulosin  0.4 mg Oral q morning - 10a  . testosterone  5 g Transdermal Daily    Assessment/Plan 1.  Left colon diverticular bleed; Bleeding scan:  Positive study with active GI bleeding in the left mid to lower abdomen. This likely originates in the distal descending colon. 2.  This is  the 4th episode of bleeding 12/28/11, 02/11/09, polypectomy with bleed 08/2006. 3.  Laparoscopic Hernia repair with Mesh 09/2013. 4.  Hypertension 5.  Hyperlipidemia    Plan:  He has no  plans of allowing surgery at this point.  We will be glad to see again if circumstances change and he reconsiders.  I will add follow up information with Dr. Rosendo Gros who did his hernia repair.    LOS: 4 days    Licet Dunphy 01/26/2014

## 2014-01-26 NOTE — Progress Notes (Addendum)
Patient signed form [place] advice, then left unit with wife

## 2014-02-09 NOTE — Discharge Summary (Signed)
Physician Discharge Summary  [name] XIP:[id number] DOB: 03-05-1946 DOA: 01/22/2014  PCP: Garrett Kroner, MD  Admit date: 01/22/2014 Discharge date: 01/26/2014  Time spent: greater than 30 min  Disposition: left AMA  Discharge Diagnoses:  Principal Problem:   Lower GI bleed Active Problems:   ADENOMATOUS COLONIC POLYP   Diverticulosis of colon with hemorrhage   HTN (hypertension)   Acute blood loss anemia   Other and unspecified hyperlipidemia   BPH (benign prostatic hypertrophy)   Hypotension, unspecified   Discharge Condition: not stable for discharge, left [facility]  [facility] Weights   01/24/14 0838  Weight: 104.327 kg (230 lb)    History of present illness:  68 year old male with a history of hypertension, hyperlipidemia, diverticula with diverticular bleeding who presented to [name] with main concern of sudden onset of bright red blood per rectum with some dark blood. Pt was worried as he has had similar events in the past and has required blood transfusion. He denies any specific symptoms such as abdominal pain, no urinary concerns, no shortness of breath, no chest pain, no lightheadedness or dizziness. No fevers, chills, other systemic concerns.  In ED, pt found to be hemodynamically stable, no bleeding since arrival to ED, [facility] asked to admit for observation.  Hospital Course:  Admitted to hospitalists. Has h/o diverticulosis and 2 previous admissions for GIB.  Case discussed with GI. EGD done a few months ago showed non bleeding diverticula, and elective referral to surgery recommended.   Patient never followed up.  Bleeding scan positive sigmoid bleed.  Surgery consulted. Patient did experience hypotension which resolved with IVF.  Patient had continued bleeding. Did not require transfusion. Refused surgery and left AMA  Procedures:  none  Consultations:  CCS  Discharge Exam: Filed Vitals:   01/26/14 0502  BP: 121/73  Pulse: 84  Temp: 98.5 F (36.9 C)  Resp: 18     General: alert Cardiovascular: RRR Respiratory: CTA  Discharge Instructions  med rec not completed, as patient left AMA  Allergies  Allergen Reactions  . Penicillins Itching and Rash       Follow-up Information   Follow up with Garrett Ivan, MD In 1 month.   Specialty:  General Surgery   Contact information:   [address] [place] [address] [phone]        The results of significant diagnostics from this hospitalization (including imaging, microbiology, ancillary and laboratory) are listed below for reference.    Significant Diagnostic Studies: Nm Gi Blood Loss  01/23/2014   CLINICAL DATA:  GI bleed.  EXAM: NUCLEAR MEDICINE GASTROINTESTINAL BLEEDING SCAN  TECHNIQUE: Sequential abdominal images were obtained following intravenous administration of Tc-9m labeled red blood cells.  COMPARISON:  None.  RADIOPHARMACEUTICALS:  25 mCi Tc-51m in-vitro labeled red cells.  FINDINGS: There is abnormal accumulation of radiotracer within the left mid abdomen immediately at the beginning of the study. There is progressive accumulation of radiotracer in the left mid abdomen coursing inferomedially with the configuration of a loop of bowel. This progresses both slightly retrograde into the left upper quadrant and predominantly antegrade towards the central/lower abdomen with imaging carried out to 60 min.  IMPRESSION: Positive study with active GI bleeding in the left mid to lower abdomen. This likely originates in the distal descending colon.  Critical Value/emergent results were called by telephone at the time of interpretation on 01/23/2014 at 12:42 PM to Dr. Doree Barthel , who verbally acknowledged these results.   Electronically Signed  By: Logan Bores   On: 01/23/2014 12:42    Microbiology: No results found for this or any previous visit (from the past 240 hour(s)).   Labs: Basic Metabolic Panel: No results found for this basename: NA, K, CL, CO2,  GLUCOSE, BUN, CREATININE, CALCIUM, MG, PHOS,  in the last 168 hours Liver Function Tests: No results found for this basename: AST, ALT, ALKPHOS, BILITOT, PROT, ALBUMIN,  in the last 168 hours No results found for this basename: LIPASE, AMYLASE,  in the last 168 hours No results found for this basename: AMMONIA,  in the last 168 hours CBC: No results found for this basename: WBC, NEUTROABS, HGB, HCT, MCV, PLT,  in the last 168 hours Cardiac Enzymes: No results found for this basename: CKTOTAL, CKMB, CKMBINDEX, TROPONINI,  in the last 168 hours BNP: BNP (last 3 results) No results found for this basename: PROBNP,  in the last 8760 hours CBG: No results found for this basename: GLUCAP,  in the last 168 hours     Signed:  [place] L  [facility] 02/09/2014, 10:35 PM

## 2015-06-04 ENCOUNTER — Encounter: Payer: Self-pay | Admitting: Gastroenterology

## 2015-06-26 ENCOUNTER — Emergency Department (HOSPITAL_COMMUNITY)
Admission: EM | Admit: 2015-06-26 | Discharge: 2015-06-27 | Disposition: A | Discharge status: Home / Self Care | Payer: Managed Care, Other (non HMO) | Attending: Emergency Medicine | Admitting: Emergency Medicine

## 2015-06-26 ENCOUNTER — Emergency Department (HOSPITAL_COMMUNITY): Payer: Managed Care, Other (non HMO)

## 2015-06-26 ENCOUNTER — Encounter (HOSPITAL_COMMUNITY): Payer: Self-pay | Admitting: Emergency Medicine

## 2015-06-26 DIAGNOSIS — N400 Enlarged prostate without lower urinary tract symptoms: Secondary | ICD-10-CM | POA: Diagnosis not present

## 2015-06-26 DIAGNOSIS — Y9389 Activity, other specified: Secondary | ICD-10-CM | POA: Diagnosis not present

## 2015-06-26 DIAGNOSIS — Z86018 Personal history of other benign neoplasm: Secondary | ICD-10-CM | POA: Insufficient documentation

## 2015-06-26 DIAGNOSIS — Z862 Personal history of diseases of the blood and blood-forming organs and certain disorders involving the immune mechanism: Secondary | ICD-10-CM | POA: Insufficient documentation

## 2015-06-26 DIAGNOSIS — R55 Syncope and collapse: Secondary | ICD-10-CM | POA: Insufficient documentation

## 2015-06-26 DIAGNOSIS — N529 Male erectile dysfunction, unspecified: Secondary | ICD-10-CM | POA: Diagnosis not present

## 2015-06-26 DIAGNOSIS — R4020 Unspecified coma: Secondary | ICD-10-CM

## 2015-06-26 DIAGNOSIS — T63441A Toxic effect of venom of bees, accidental (unintentional), initial encounter: Secondary | ICD-10-CM

## 2015-06-26 DIAGNOSIS — Z79899 Other long term (current) drug therapy: Secondary | ICD-10-CM | POA: Diagnosis not present

## 2015-06-26 DIAGNOSIS — Y9289 Other specified places as the place of occurrence of the external cause: Secondary | ICD-10-CM | POA: Diagnosis not present

## 2015-06-26 DIAGNOSIS — I10 Essential (primary) hypertension: Secondary | ICD-10-CM | POA: Insufficient documentation

## 2015-06-26 DIAGNOSIS — Z880 Allergy status to penicillin: Secondary | ICD-10-CM | POA: Diagnosis not present

## 2015-06-26 DIAGNOSIS — Z8709 Personal history of other diseases of the respiratory system: Secondary | ICD-10-CM | POA: Insufficient documentation

## 2015-06-26 DIAGNOSIS — Z8669 Personal history of other diseases of the nervous system and sense organs: Secondary | ICD-10-CM | POA: Insufficient documentation

## 2015-06-26 DIAGNOSIS — Y998 Other external cause status: Secondary | ICD-10-CM | POA: Diagnosis not present

## 2015-06-26 DIAGNOSIS — E785 Hyperlipidemia, unspecified: Secondary | ICD-10-CM | POA: Diagnosis not present

## 2015-06-26 LAB — CBC WITH DIFFERENTIAL/PLATELET
Basophils Absolute: 0 10*3/uL (ref 0.0–0.1)
Basophils Relative: 0 % (ref 0–1)
Eosinophils Absolute: 0.1 10*3/uL (ref 0.0–0.7)
Eosinophils Relative: 2 % (ref 0–5)
HCT: 40 % (ref 39.0–52.0)
HEMOGLOBIN: 13.2 g/dL (ref 13.0–17.0)
LYMPHS ABS: 2.1 10*3/uL (ref 0.7–4.0)
Lymphocytes Relative: 32 % (ref 12–46)
MCH: 27.3 pg (ref 26.0–34.0)
MCHC: 33 g/dL (ref 30.0–36.0)
MCV: 82.6 fL (ref 78.0–100.0)
Monocytes Absolute: 0.3 10*3/uL (ref 0.1–1.0)
Monocytes Relative: 5 % (ref 3–12)
NEUTROS PCT: 61 % (ref 43–77)
Neutro Abs: 3.9 10*3/uL (ref 1.7–7.7)
Platelets: 185 10*3/uL (ref 150–400)
RBC: 4.84 MIL/uL (ref 4.22–5.81)
RDW: 15.1 % (ref 11.5–15.5)
WBC: 6.5 10*3/uL (ref 4.0–10.5)

## 2015-06-26 LAB — I-STAT TROPONIN, ED: TROPONIN I, POC: 0 ng/mL (ref 0.00–0.08)

## 2015-06-26 LAB — BASIC METABOLIC PANEL
ANION GAP: 14 (ref 5–15)
BUN: 11 mg/dL (ref 6–20)
CALCIUM: 8.7 mg/dL — AB (ref 8.9–10.3)
CO2: 20 mmol/L — ABNORMAL LOW (ref 22–32)
Chloride: 99 mmol/L — ABNORMAL LOW (ref 101–111)
Creatinine, Ser: 1.33 mg/dL — ABNORMAL HIGH (ref 0.61–1.24)
GFR calc Af Amer: >60 mL/min (ref >60)
GFR, EST NON AFRICAN AMERICAN: 54 mL/min — AB (ref >60)
Glucose, Bld: 169 mg/dL — ABNORMAL HIGH (ref 65–99)
POTASSIUM: 2.8 mmol/L — AB (ref 3.5–5.1)
SODIUM: 133 mmol/L — AB (ref 135–145)

## 2015-06-26 LAB — ETHANOL: ALCOHOL ETHYL (B): 63 mg/dL — AB (ref <5)

## 2015-06-26 MED ORDER — DIPHENHYDRAMINE HCL 50 MG/ML IJ SOLN
25.0000 mg | Freq: Once | INTRAMUSCULAR | Status: AC
  Administered 2015-06-26: 25 mg via INTRAVENOUS
  Filled 2015-06-26: qty 1

## 2015-06-26 MED ORDER — SODIUM CHLORIDE 0.9 % IV BOLUS (SEPSIS)
1000.0000 mL | Freq: Once | INTRAVENOUS | Status: AC
  Administered 2015-06-26: 1000 mL via INTRAVENOUS

## 2015-06-26 MED ORDER — FAMOTIDINE IN NACL 20-0.9 MG/50ML-% IV SOLN
20.0000 mg | Freq: Once | INTRAVENOUS | Status: AC
  Administered 2015-06-26: 20 mg via INTRAVENOUS
  Filled 2015-06-26: qty 50

## 2015-06-26 MED ORDER — METHYLPREDNISOLONE SODIUM SUCC 125 MG IJ SOLR
125.0000 mg | Freq: Once | INTRAMUSCULAR | Status: AC
  Administered 2015-06-26: 125 mg via INTRAVENOUS
  Filled 2015-06-26: qty 2

## 2015-06-26 NOTE — Discharge Instructions (Signed)
Refer to attached documents for more information. Return to the ED with worsening or concerning symptoms. Follow up with your doctor as needed.

## 2015-06-26 NOTE — ED Notes (Signed)
Per EMS - pt comes from home following bee sting.  Pt's wife came inside and found pt lying on couch diaphoretic and responsive to pain only.  Wife states she did hear him say he got stung by a bee on the L leg before going inside.  Given epi pen by Fire/Rescue, HR initially 150, down to 90 en route to ED.  Upon EMS arrival, pt was hunched over, tachypneic with clear lung sounds.  Given total .3mg  epinephrine, 50mg  benadryl, 50mg  zantac, and 257ml NS bolus.  18g. PIVs in both ACs.  EMS last VS: 90 HR, 108/76, CBG 168, 96% RA.  Alert and Oriented, NAD at this time.

## 2015-06-26 NOTE — ED Notes (Signed)
Patient returned from CT

## 2015-06-26 NOTE — ED Provider Notes (Signed)
CSN: [id number]     Arrival date & time 06/26/15  2003 History   First MD Initiated Contact with Patient 06/26/15 2004     Chief Complaint  Patient presents with  . Allergic Reaction     (Consider location/radiation/quality/duration/timing/severity/associated sxs/prior Treatment) HPI Comments: Patient is a 69 year old male with a past medical history of hypertension and hyperlipidemia who presents via EMS after an episode of unresponsiveness. Patient's wife reports the patient was working in the yard today and came inside after being stung by a bee. He was resting on the couch when his wife heard snoring respirations and went to arouse him. She reports he was unresponsive at this time. She called EMS and when they arrived, he would only respond to painful stimuli and was tachypneic. He was given epinephrine, benadryl, zantac, and fluids. Patient became more alert on the way to the hospital. Patient wife reports this is typical of his reaction when he is stung by a bee or wasp as he is severely allergic. No aggravating/alleviating factors. He denies any throat closing, SOB, or wheezing.    Past Medical History  Diagnosis Date  . Inflammatory polyps of colon   . Pneumothorax 2009    spontaneous, treated with chest tube.  Dr Roxy Horseman.  Marland Kitchen Hx of adenomatous colonic polyps 2007  . Hemorrhage of colon following colonoscopy 2007    post polypectomy bleed 2007, treated with endo clip and epi injection.  . Acute blood loss anemia 2007, 2010    from GI bleeds, required 2 PRBCs each episode.   . Glaucoma   . HLD (hyperlipidemia)   . HTN (hypertension)   . BPH (benign prostatic hyperplasia)   . ED (erectile dysfunction)    Past Surgical History  Procedure Laterality Date  . Tonsillectomy    . Hernia repair     Family History  Problem Relation Age of Onset  . Heart disease Mother   . Diabetes Mother   . Hypertension Mother   . Cancer Father     type unknown, mets   History  Substance Use  Topics  . Smoking status: Never Smoker   . Smokeless tobacco: Current User    Types: Chew     Comment: chews tobacco for 60 yrs(grandpa gave it to him) 27oz/week  . Alcohol Use: Yes     Comment: occasionaly    Review of Systems  Constitutional: Positive for diaphoresis.  Neurological: Positive for syncope.  All other systems reviewed and are negative.     Allergies  Bee venom and Penicillins  Home Medications   Prior to Admission medications   Medication Sig Start Date End Date Taking? Authorizing Provider  atorvastatin (LIPITOR) 20 MG tablet Take 20 mg by mouth daily at 6 PM.    Yes Historical Provider, MD  lisinopril-hydrochlorothiazide (PRINZIDE,ZESTORETIC) 20-25 MG per tablet Take 1 tablet by mouth daily. 05/30/15  Yes Historical Provider, MD  Tamsulosin HCl (FLOMAX) 0.4 MG CAPS Take 0.4 mg by mouth every morning.    Yes Historical Provider, MD  VESICARE 5 MG tablet Take 5 mg by mouth daily. 05/12/15  Yes Historical Provider, MD   BP 127/83 mmHg  Pulse 93  Temp(Src) 98.3 F (36.8 C) (Oral)  Resp 18  Ht 6\' 3"  (1.905 m)  Wt 240 lb (108.863 kg)  BMI 30.00 kg/m2  SpO2 97% Physical Exam  Constitutional: He is oriented to person, place, and time. He appears well-developed and well-nourished. No distress.  HENT:  Head: Normocephalic and atraumatic.  Eyes: Conjunctivae and EOM are normal. Pupils are equal, round, and reactive to light.  Neck: Normal range of motion.  Cardiovascular: Normal rate and regular rhythm.  Exam reveals no gallop and no friction rub.   No murmur heard. Pulmonary/Chest: Effort normal and breath sounds normal. He has no wheezes. He has no rales. He exhibits no tenderness.  Abdominal: Soft. He exhibits no distension. There is no tenderness. There is no rebound.  Musculoskeletal: Normal range of motion.  Neurological: He is oriented to person, place, and time. Coordination normal.  Patient is somnolent. He responds to questions and commands. Speech is  goal-oriented. Moves limbs without ataxia.   Skin: Skin is warm and dry.  Stinger in left lateral thigh. No surrounding edema, erythema or open wound.   Psychiatric: He has a normal mood and affect. His behavior is normal.  Nursing note and vitals reviewed.   ED Course  Procedures (including critical care time) Labs Review Labs Reviewed  BASIC METABOLIC PANEL - Abnormal; Notable for the following:    Sodium 133 (*)    Potassium 2.8 (*)    Chloride 99 (*)    CO2 20 (*)    Glucose, Bld 169 (*)    Creatinine, Ser 1.33 (*)    Calcium 8.7 (*)    GFR calc non Af Amer 54 (*)    All other components within normal limits  ETHANOL - Abnormal; Notable for the following:    Alcohol, Ethyl (B) 63 (*)    All other components within normal limits  CBC WITH DIFFERENTIAL/PLATELET  I-STAT TROPOININ, ED    Imaging Review Ct Head Wo Contrast  06/26/2015   CLINICAL DATA:  Bee sting.  Diaphoretic.  Responsive only to pain.  EXAM: CT HEAD WITHOUT CONTRAST  TECHNIQUE: Contiguous axial images were obtained from the base of the skull through the vertex without intravenous contrast.  COMPARISON:  None.  FINDINGS: There is no intracranial hemorrhage or extra-axial fluid collection. There is hypodensity in the right globus pallidus and putamen, also in the subinsular regions bilaterally, and these could represent nonhemorrhagic acute infarction. Brainstem and posterior fossa appear unremarkable. No mass effect or midline shift.  IMPRESSION: Small subtle areas of hypodensity in the subinsular regions and right basal ganglia, possibly acute infarctions. No intracranial hemorrhage. No mass effect.   Electronically Signed   By: Andreas Newport M.D.   On: 06/26/2015 21:56   Mr Brain Wo Contrast  06/26/2015   CLINICAL DATA:  Initial evaluation for acute loss of consciousness.  EXAM: MRI HEAD WITHOUT CONTRAST  TECHNIQUE: Multiplanar, multiecho pulse sequences of the brain and surrounding structures were obtained  without intravenous contrast.  COMPARISON:  Prior CT from earlier the same day.  FINDINGS: Mild generalized age-related cerebral atrophy present. Minimal T2/FLAIR hyperintense foci within the periventricular deep white matter both cerebral hemispheres present, most likely related to very mild chronic small vessel ischemic disease.  No abnormal foci of restricted diffusion to suggest acute intracranial infarct. Gray-white matter differentiation is maintained. Normal intravascular flow voids are preserved. No acute or chronic intracranial hemorrhage.  No mass lesion, mass effect, or midline shift. No hydrocephalus. No extra-axial fluid collection.  Craniocervical junction within normal limits. Mild degenerative changes noted within the visualized upper cervical spine. Pituitary gland normal.  No acute abnormality about the orbits.  Mild mucosal thickening seen throughout the paranasal sinuses. No air-fluid levels to suggest active sinus infection. No mastoid effusion. Inner ear structures within normal limits.  Bone marrow signal intensity  within normal limits. No scalp soft tissue abnormality.  IMPRESSION: 1. No acute intracranial infarct or other abnormality identified. 2. Mild age-related cerebral atrophy with chronic microvascular ischemic disease.   Electronically Signed   By: Jeannine Boga M.D.   On: 06/26/2015 23:43     EKG Interpretation   Date/Time:  Saturday June 26 2015 20:08:59 EDT Ventricular Rate:  93 PR Interval:  179 QRS Duration: 71 QT Interval:  389 QTC Calculation: 484 R Axis:   39 Text Interpretation:  Sinus rhythm Ventricular premature complex Left  atrial enlargement Posterior infarct, old since last tracing no  significant change Confirmed by MILLER  MD, BRIAN ([id number]) on 06/26/2015  8:19:13 PM      MDM   Final diagnoses:  Bee sting reaction, accidental or unintentional, initial encounter  LOC (loss of consciousness)    8:51 PM Patient's vitals stable and patient  afebrile. Patient given solumedrol, benadryl, and pepcid for possible allergic reaction. I pulled a stinger out of the left lateral thigh. Patient appears drowsy at this time. He is able to respond to questions and commands but will drift back to sleep.  11:42 PM Head CT concerning for possible acute infarctions. MRI brain pending. Patient is much more alert now.   11:58 PM Patient's MRI shows no acute changes. Patient feeling better and is ready to go home. Patient's wife states this is his typical reaction to bee stings however after discussing the patient's reaction with Dr. Sabra Heck, we felt he warranted further work up. Patient's work up unremarkable. Patient instructed to follow up with PCP and return with worsening or concerning symptoms.   [address] [place], PA-C 06/27/15 [id number]  Noemi Chapel, MD 06/27/15 1355

## 2015-06-26 NOTE — ED Provider Notes (Signed)
The patient is a 69 year old male, he presents to the hospital with altered mental status after the patient's wife came inside the house and found him laying on the couch, diaphoretic, unresponsive to voice, foaming at the mouth. They called the paramedics who arrived and found the patient to have decreased level of consciousness, he was tachypneic, he did not have a rash, he was given epinephrine, Benadryl, Zantac and a saline bolus.  On exam the patient has a soft abdomen, clear heart and lung sounds, no peripheral edema, he follows commands without difficulty but he does appear somnolent, he answers questions but has his eyes closed and appears as though he is sleeping. No signs of tongue or dental trauma.  The patient will need further evaluation with CT scan of the brain and labs to further evaluate the source of the altered mental status. The stinger was removed from his leg on arrival, he has no rash, no urticaria, no itching, no closing of the throat, the airway or wheezing.Marland Kitchen  MRI neg, pt has returned to baseline and wife insists that he does this every time he has a bee sting - stable for d/c.   Medical screening examination/treatment/procedure(s) were conducted as a shared visit with non-physician practitioner(s) and myself.  I personally evaluated the patient during the encounter.  Clinical Impression:   Final diagnoses:  Bee sting reaction, accidental or unintentional, initial encounter  LOC (loss of consciousness)         Noemi Chapel, MD 06/27/15 1355

## 2016-08-17 ENCOUNTER — Encounter (HOSPITAL_COMMUNITY): Payer: Self-pay | Admitting: Emergency Medicine

## 2016-08-17 ENCOUNTER — Emergency Department (HOSPITAL_COMMUNITY)
Admission: EM | Admit: 2016-08-17 | Discharge: 2016-08-17 | Disposition: A | Discharge status: Home / Self Care | Payer: Managed Care, Other (non HMO) | Attending: Emergency Medicine | Admitting: Emergency Medicine

## 2016-08-17 DIAGNOSIS — K625 Hemorrhage of anus and rectum: Secondary | ICD-10-CM | POA: Insufficient documentation

## 2016-08-17 DIAGNOSIS — Z5321 Procedure and treatment not carried out due to patient leaving prior to being seen by health care provider: Secondary | ICD-10-CM | POA: Insufficient documentation

## 2016-08-17 DIAGNOSIS — I10 Essential (primary) hypertension: Secondary | ICD-10-CM | POA: Insufficient documentation

## 2016-08-17 LAB — COMPREHENSIVE METABOLIC PANEL
ALK PHOS: 52 U/L (ref 38–126)
ALT: 31 U/L (ref 17–63)
AST: 23 U/L (ref 15–41)
Albumin: 3.5 g/dL (ref 3.5–5.0)
Anion gap: 7 (ref 5–15)
BUN: 12 mg/dL (ref 6–20)
CALCIUM: 9.2 mg/dL (ref 8.9–10.3)
CO2: 25 mmol/L (ref 22–32)
Chloride: 105 mmol/L (ref 101–111)
Creatinine, Ser: 0.99 mg/dL (ref 0.61–1.24)
GFR calc non Af Amer: >60 mL/min (ref >60)
Glucose, Bld: 119 mg/dL — ABNORMAL HIGH (ref 65–99)
Potassium: 3.8 mmol/L (ref 3.5–5.1)
SODIUM: 137 mmol/L (ref 135–145)
Total Bilirubin: 0.4 mg/dL (ref 0.3–1.2)
Total Protein: 6.7 g/dL (ref 6.5–8.1)

## 2016-08-17 LAB — CBC
HEMATOCRIT: 39.5 % (ref 39.0–52.0)
Hemoglobin: 12.8 g/dL — ABNORMAL LOW (ref 13.0–17.0)
MCH: 28 pg (ref 26.0–34.0)
MCHC: 32.4 g/dL (ref 30.0–36.0)
MCV: 86.4 fL (ref 78.0–100.0)
Platelets: 185 10*3/uL (ref 150–400)
RBC: 4.57 MIL/uL (ref 4.22–5.81)
RDW: 15.1 % (ref 11.5–15.5)
WBC: 6.8 10*3/uL (ref 4.0–10.5)

## 2016-08-17 LAB — TYPE AND SCREEN
ABO/RH(D): A POS
Antibody Screen: NEGATIVE

## 2016-08-17 NOTE — ED Triage Notes (Signed)
Reports bright red blood per rectum, states one episode today. Reports hx diverticulitis and has had issue before. VS stable at this time. Denies pain. Wife reports that pt will often pass out while attempting to pass stools.

## 2016-08-17 NOTE — ED Notes (Signed)
Pt left the ER

## 2016-08-17 NOTE — ED Notes (Signed)
Bright red blood per rectum witnessed by this RN

## 2016-08-28 ENCOUNTER — Encounter: Payer: Self-pay | Admitting: Nurse Practitioner

## 2016-08-28 ENCOUNTER — Ambulatory Visit (INDEPENDENT_AMBULATORY_CARE_PROVIDER_SITE_OTHER): Payer: BLUE CROSS/BLUE SHIELD | Admitting: Nurse Practitioner

## 2016-08-28 ENCOUNTER — Encounter (INDEPENDENT_AMBULATORY_CARE_PROVIDER_SITE_OTHER): Payer: Self-pay

## 2016-08-28 VITALS — BP 150/70 | HR 76 | Ht 75.0 in | Wt 233.0 lb

## 2016-08-28 DIAGNOSIS — K625 Hemorrhage of anus and rectum: Secondary | ICD-10-CM

## 2016-08-28 NOTE — Patient Instructions (Signed)
Follow up with [place] as needed.  Please call if the bleeding reoccurs.

## 2016-08-28 NOTE — Progress Notes (Signed)
.       HPI: Patient is a 70 year old male known to Dr. Ardis Hughs for a history of adenomatous colon polyps and recurrent lower GI bleeding presumed to be diverticular. Patient is here today for evaluation of rectal bleeding. On 08/17/16 patient had one episode of painless rectal bleeding associated with constipation. Blood was bright red, large amount. He went to ED for evaluation but left ED before completion of evaluation because of long wait time. Labs were unremarkable.  Patient had been struggling with constipation in the days leading up to the episode of bleeding. He started a stool softener with resolution of the constipation. Patient came today at the urging of his wife. He doesn't take blood thinners nor NSAIDS.   Past Medical History:  Diagnosis Date  . Acute blood loss anemia 2007, 2010   from GI bleeds, required 2 PRBCs each episode.   Marland Kitchen BPH (benign prostatic hyperplasia)   . ED (erectile dysfunction)   . Glaucoma   . Hemorrhage of colon following colonoscopy 2007   post polypectomy bleed 2007, treated with endo clip and epi injection.  Marland Kitchen HLD (hyperlipidemia)   . HTN (hypertension)   . Hx of adenomatous colonic polyps 2007  . Inflammatory polyps of colon ([place])   . Pneumothorax 2009   spontaneous, treated with chest tube.  Dr Roxy Horseman.    Patient's past surgical, family history, and social history were reviewed by me in [place].   Physical Exam: BP (!) 150/70   Pulse 76   Ht 6\' 3"  (1.905 m)   Wt 233 lb (105.7 kg)   BMI 29.12 kg/m  General : well developed black male in NAD Psychiatric: alert and oriented x3, pleasant Cardiology: RRR, no murmurs, no peripheral edema Lungs: CTA bilaterally. Normal  respiratory effort Abdomen: soft, nontender, nondistended, no obvious ascites, no peritoneal signs, normal bowel sounds Rectal : no external hemorrhoids nor obvious fissures. No gross blood in anal vault   ASSESSMENT AND PLAN:  Episode of painless rectal bleeding 08/17/16. Hx of  recurrent diverticular bleeds but this recent isolated episode likely perianal in setting of constipation. No external hemorrhoids or fissures on exam. Advised patient to call office ASAP if he has any recurrent rectal bleeding. Avoid constipation. Of note hgb in ED on 08/17/16 was 12.8,  near baseline of 13.2  Constipation, resolved after initiation of stool softeners. Continue stool softeners, call if constipation recurs.   Hx of adenomatous colon polyps, surveillance colonoscopy due October 2019

## 2016-08-29 NOTE — Progress Notes (Signed)
I agree with the above note, plan 

## 2018-01-07 ENCOUNTER — Other Ambulatory Visit: Payer: Self-pay | Admitting: Urology

## 2018-01-07 DIAGNOSIS — C61 Malignant neoplasm of prostate: Secondary | ICD-10-CM

## 2018-02-06 ENCOUNTER — Other Ambulatory Visit: Payer: Medicare Other

## 2018-02-21 ENCOUNTER — Ambulatory Visit
Admission: RE | Admit: 2018-02-21 | Discharge: 2018-02-21 | Disposition: A | Discharge status: Home / Self Care | Payer: BLUE CROSS/BLUE SHIELD | Source: Ambulatory Visit | Attending: Urology | Admitting: Urology

## 2018-02-21 DIAGNOSIS — C61 Malignant neoplasm of prostate: Secondary | ICD-10-CM

## 2018-02-21 MED ORDER — GADOBENATE DIMEGLUMINE 529 MG/ML IV SOLN
20.0000 mL | Freq: Once | INTRAVENOUS | Status: AC | PRN
  Administered 2018-02-21: 20 mL via INTRAVENOUS

## 2018-03-12 ENCOUNTER — Encounter: Payer: Self-pay | Admitting: Radiation Oncology

## 2018-03-29 ENCOUNTER — Encounter: Payer: Self-pay | Admitting: Radiation Oncology

## 2018-03-29 NOTE — Progress Notes (Signed)
GU Location of Tumor / Histology: prostatic adenocarcinoma  If Prostate Cancer, Gleason Score is (3 + 4) and PSA is (9.45) on 12/31/2017. Prostate volume: 120 grams.   [name] was diagnosed with prostate cancer in April 2015. He opted for active surveillance. Patient referred to Dr. Tammi Klippel by Dr. Lovena Neighbours. Patient has had a total of 3 prostate biopsies.   02/2014  PSA  4.84 07/2014  PSA  5.87 12/2014  PSA  6.2 06/2017  PSA  8.12 12/31/2017 PSA  9.45   Biopsies of prostate (if applicable) revealed:    Past/Anticipated interventions by urology, if any: active surveillance, prostate biopsy, ct abd/pelvis (negative), referral to radiation oncology   Past/Anticipated interventions by medical oncology, if any: no  Weight changes, if any: no  Bowel/Bladder complaints, if any: IPSS 9. Reports nocturia x 2 and weak urine stream. Reports intermittent leakage. Denies dysuria or hematuria.  Nausea/Vomiting, if any: no  Pain issues, if any:  Reports testicular pain x several weeks and since biopsy. Reports he presented to urgent care and was given an antibiotic. Reports the antibiotic doesn't seem to make much of a difference. Reports the pain returns with strenuous activity. Patient is uncertain of the name of the antibiotic he was prescribed.  SAFETY ISSUES:  Prior radiation? no  Pacemaker/ICD? no  Possible current pregnancy? no  Is the patient on methotrexate? no  Current Complaints / other details:  72 year old male. Married. Father with hx of kidney and prostate ca. Ax: penicillin

## 2018-04-01 ENCOUNTER — Encounter

## 2018-04-01 ENCOUNTER — Encounter: Payer: Self-pay | Admitting: Medical Oncology

## 2018-04-01 ENCOUNTER — Ambulatory Visit
Admission: RE | Admit: 2018-04-01 | Discharge: 2018-04-01 | Disposition: A | Discharge status: Home / Self Care | Payer: BLUE CROSS/BLUE SHIELD | Source: Ambulatory Visit | Attending: Radiation Oncology | Admitting: Radiation Oncology

## 2018-04-01 ENCOUNTER — Other Ambulatory Visit: Payer: Self-pay

## 2018-04-01 ENCOUNTER — Encounter: Payer: Self-pay | Admitting: Radiation Oncology

## 2018-04-01 VITALS — BP 145/78 | HR 72 | Temp 97.9°F | Resp 18 | Wt 231.2 lb

## 2018-04-01 DIAGNOSIS — N529 Male erectile dysfunction, unspecified: Secondary | ICD-10-CM | POA: Diagnosis not present

## 2018-04-01 DIAGNOSIS — I10 Essential (primary) hypertension: Secondary | ICD-10-CM | POA: Insufficient documentation

## 2018-04-01 DIAGNOSIS — Z86010 Personal history of colonic polyps: Secondary | ICD-10-CM | POA: Insufficient documentation

## 2018-04-01 DIAGNOSIS — C61 Malignant neoplasm of prostate: Secondary | ICD-10-CM | POA: Diagnosis not present

## 2018-04-01 DIAGNOSIS — Z8042 Family history of malignant neoplasm of prostate: Secondary | ICD-10-CM | POA: Diagnosis not present

## 2018-04-01 DIAGNOSIS — Z79899 Other long term (current) drug therapy: Secondary | ICD-10-CM | POA: Diagnosis not present

## 2018-04-01 DIAGNOSIS — E785 Hyperlipidemia, unspecified: Secondary | ICD-10-CM | POA: Insufficient documentation

## 2018-04-01 DIAGNOSIS — Z809 Family history of malignant neoplasm, unspecified: Secondary | ICD-10-CM

## 2018-04-01 DIAGNOSIS — Z800 Family history of malignant neoplasm of digestive organs: Secondary | ICD-10-CM | POA: Insufficient documentation

## 2018-04-01 HISTORY — DX: Malignant neoplasm of prostate: C61

## 2018-04-01 NOTE — Progress Notes (Signed)
Radiation Oncology         [phone] ________________________________  Initial Outpatient Consultation  Name: [name] MRN: [id number]  Date: 04/01/2018  DOB: 1946/03/26  [id number], Garrett Brow, MD  Davis Gourd*   REFERRING PHYSICIAN: Davis Gourd*  DIAGNOSIS: 72 y.o. gentleman with Stage T1c adenocarcinoma of the prostate with Gleason Score of 3+4, and PSA of 9.45    ICD-10-CM   1. Malignant neoplasm of prostate ([place]) [place] [facility]ulatory Referral to Genetics  2. Family history of cancer Z80.9     HISTORY OF PRESENT ILLNESS: [name] is a 72 y.o. male with a diagnosis of prostate cancer. He was noted to have an elevated PSA of 4.84 and a diagnosis of prostate cancer in April 2015. He elected to undergo active surveillance. This was a low risk Gleason 6. His PSA history is as followed: 02/2014 PSA 4.84 07/2014 PSA 5.87 12/2014 PSA 6.2 06/2017 PSA 8.12 12/31/17 PSA 9.45  The patient underwent repeat biopsy in February 2018. This was a low risk Gleason 6 and PSA at this time was 6.43.   The patient underwent a repeat biopsy on 02/25/18. The prostate volume measured 120 cc.  Out of 12 core biopsies, 5 were positive.  The maximum Gleason score was 3+4, and this was seen in the right apex. Gleason score of 3+3 was seen in the right mid, right apex lateral, left base lateral, and left mid lateral.    The patient underwent CT of the abdomen and pelvis on 03/08/18. This showed no findings of adenopathy or osseous disease in the abdomen or pelvis. The patient reviewed the biopsy results with his urologist and he has kindly been referred today for discussion of potential radiation treatment options.  PREVIOUS RADIATION THERAPY: No  PAST MEDICAL HISTORY:  Past Medical History:  Diagnosis Date  . Acute blood loss anemia 2007, 2010   from GI bleeds, required 2 PRBCs each episode.   Marland Kitchen BPH (benign prostatic hyperplasia)   . ED (erectile dysfunction)   . Glaucoma     . Hemorrhage of colon following colonoscopy 2007   post polypectomy bleed 2007, treated with endo clip and epi injection.  Marland Kitchen HLD (hyperlipidemia)   . HTN (hypertension)   . Hx of adenomatous colonic polyps 2007  . 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 Pneumothorax 2009   spontaneous, treated with chest tube.  Dr Roxy Horseman.  . Prostate cancer ([place])       PAST SURGICAL HISTORY: Past Surgical History:  Procedure Laterality Date  . HERNIA REPAIR    . PROSTATE BIOPSY    . TONSILLECTOMY      FAMILY HISTORY:  Family History  Problem Relation Age of Onset  . Heart disease Mother   . Diabetes Mother   . Hypertension Mother   . Prostate cancer Father        metastatic  . Prostate cancer Maternal Uncle   . Colon cancer Paternal Uncle   . Prostate cancer Paternal Uncle   . Prostate cancer Maternal Grandfather     SOCIAL HISTORY:  Social History   Socioeconomic History  . Marital status: Married    Spouse name: Not on file  . Number of children: 2  . Years of education: Not on file  . Highest education level: Not on file  Occupational History  . Occupation: Quarry manager: [employer]  Social Needs  . Financial resource strain: Not on file  . Food insecurity:  Worry: Not on file    Inability: Not on file  . Transportation needs:    Medical: Not on file    Non-medical: Not on file  Tobacco Use  . Smoking status: Never Smoker  . Smokeless tobacco: Current User    Types: Chew  . Tobacco comment: chews tobacco for 60 yrs(grandpa gave it to him) 27oz/week  Substance and Sexual Activity  . Alcohol use: Yes    Alcohol/week: 3.6 oz    Types: 6 Cans of beer per week    Comment: weekends  . Drug use: No  . Sexual activity: Not Currently  Lifestyle  . Physical activity:    Days per week: Not on file    Minutes per session: Not on file  . Stress: Not on file  Relationships  . Social connections:    Talks on phone: Not on file    Gets  together: Not on file    Attends religious service: Not on file    Active member of club or organization: Not on file    Attends meetings of clubs or organizations: Not on file    Relationship status: Not on file  . Intimate partner violence:    Fear of current or ex partner: Not on file    Emotionally abused: Not on file    Physically abused: Not on file    Forced sexual activity: Not on file  Other Topics Concern  . Not on file  Social History Narrative   The patient is married and lives in [place].    ALLERGIES: Bee venom and Penicillins  MEDICATIONS:  Current Outpatient Medications  Medication Sig Dispense Refill  . AMLODIPINE BESYLATE PO Take by mouth.    Marland Kitchen atorvastatin (LIPITOR) 20 MG tablet Take 20 mg by mouth daily at 6 PM.     . lisinopril-hydrochlorothiazide (PRINZIDE,ZESTORETIC) 20-25 MG per tablet Take 1 tablet by mouth daily.  5  . oxybutynin (DITROPAN-XL) 10 MG 24 hr tablet Take 10 mg by mouth at bedtime.    . tamsulosin (FLOMAX) 0.4 MG CAPS capsule Take 0.4 mg by mouth.     No current facility-administered medications for this encounter.     REVIEW OF SYSTEMS:  On review of systems, the patient reports that he is doing well overall. He denies any chest pain, shortness of breath, cough, fevers, chills, night sweats, unintended weight changes. He denies any bowel disturbances, and denies abdominal pain, nausea or vomiting. He denies any new musculoskeletal or joint aches or pains. He reports testicular pain for several weeks since the biopsy. He notes nocturia x2 and weak urine stream. He notes intermittent leakage. His IPSS was 9, indicating moderate urinary symptoms. He is not able to complete sexual activity with most attempts. A complete review of systems is obtained and is otherwise negative.    PHYSICAL EXAM:  Wt Readings from Last 3 Encounters:  04/01/18 231 lb 3.2 oz (104.9 kg)  08/28/16 233 lb (105.7 kg)  08/17/16 240 lb (108.9 kg)   Temp Readings from  Last 3 Encounters:  04/01/18 97.9 F (36.6 C) (Oral)  08/17/16 98.8 F (37.1 C) (Oral)  06/27/15 98.6 F (37 C) (Oral)   BP Readings from Last 3 Encounters:  04/01/18 (!) 145/78  08/28/16 (!) 150/70  08/17/16 126/92   Pulse Readings from Last 3 Encounters:  04/01/18 72  08/28/16 76  08/17/16 64   Pain Assessment Pain Score: 0-No pain/10  In general this is a well appearing African American gentleman in  no acute distress. He is alert and oriented x4 and appropriate throughout the examination. HEENT reveals that the patient is normocephalic, atraumatic. EOMs are intact. PERRLA. Skin is intact without any evidence of gross lesions. Cardiovascular exam reveals a regular rate and rhythm, no clicks rubs or murmurs are auscultated. Chest is clear to auscultation bilaterally. Lymphatic assessment is performed and does not reveal any adenopathy in the cervical, supraclavicular, axillary, or inguinal chains. Abdomen has active bowel sounds in all quadrants and is intact. The abdomen is soft, non tender, non distended. Lower extremities are negative for pretibial pitting edema, deep calf tenderness, cyanosis or clubbing. There was a 1 cm area of fullness similar to lipoma along the medial aspect of his clavical. It is mobile and no nodes are noted otherwise.   KPS = 100  100 - Normal; no complaints; no evidence of disease. 90   - Able to carry on normal activity; minor signs or symptoms of disease. 80   - Normal activity with effort; some signs or symptoms of disease. 48   - Cares for self; unable to carry on normal activity or to do active work. 60   - Requires occasional assistance, but is able to care for most of his personal needs. 50   - Requires considerable assistance and frequent medical care. 41   - Disabled; requires special care and assistance. 14   - Severely disabled; hospital admission is indicated although death not imminent. 22   - Very sick; hospital admission necessary; active  supportive treatment necessary. 10   - Moribund; fatal processes progressing rapidly. 0     - Dead  Karnofsky DA, Abelmann [place], Craver LS and Burchenal [facility] [phone]) The use of the nitrogen mustards in the palliative treatment of carcinoma: with particular reference to bronchogenic carcinoma Cancer 1 634-56  LABORATORY DATA:  Lab Results  Component Value Date   WBC 6.8 08/17/2016   HGB 12.8 (L) 08/17/2016   HCT 39.5 08/17/2016   MCV 86.4 08/17/2016   PLT 185 08/17/2016   Lab Results  Component Value Date   NA 137 08/17/2016   K 3.8 08/17/2016   CL 105 08/17/2016   CO2 25 08/17/2016   Lab Results  Component Value Date   ALT 31 08/17/2016   AST 23 08/17/2016   ALKPHOS 52 08/17/2016   BILITOT 0.4 08/17/2016     RADIOGRAPHY: No results found.    IMPRESSION/PLAN: 1. 72 y.o. gentleman with Stage T1c adenocarcinoma of the prostate with Gleason Score of 3+4, and PSA of 9.45. Today we reviewed the findings and workup thus far with the patient who's wife was available on Speakerphone.  We discussed the natural history of prostate cancer.  We reviewed the the implications of T-stage, Gleason's Score, and PSA on decision-making and outcomes in prostate cancer.  We discussed radiation treatment in the management of prostate cancer with regard to the logistics and delivery of external beam radiation treatment as well as the logistics and delivery of prostate brachytherapy.  We compared and contrasted each of these approaches and also compared these against prostatectomy.  The patient expressed interest in external beam radiotherapy, but has not made a final decision at the completion of our visit. He understands the logistics of meeting back with Dr. Lovena Neighbours to move forward with scheduling placement of three gold fiducial markers into the prostate to proceed with IMRT in the near future.    2.  Family history of prostate cancer. I advised the patient he would  qualify for genetic testing, he is  interested in learning more about this process. We will place referral.  We enjoyed meeting with him today, and will look forward to participating in the care of this very nice gentleman.        Carola Rhine, PAC &  Sheral Apley. Tammi Klippel, M.D.  This document serves as a record of services personally performed by Tyler Pita, MD and Shona Simpson, PA-C. It was created on their behalf by Bethann Humble, a trained medical scribe. The creation of this record is based on the scribe's personal observations and the provider's statements to them. This document has been checked and approved by the attending provider.

## 2018-04-01 NOTE — Progress Notes (Signed)
See progress note under physician encounter. 

## 2018-04-01 NOTE — Progress Notes (Signed)
Introduced myself to [name] and his wife (by phone) as the prostate nurse navigator and my role. He states that he originally wanted to have surgery but due to the size of his prostate he is not a candidate. His cousin was diagnosed the same time he was and had surgery. He he here to discuss his radiation options. We discussed he will not be a seed implant candidate due to size of his prostate so IMRT will likely be the treatment of choice. I will continue to follow and asked him to call with questions or concerns.

## 2018-04-02 ENCOUNTER — Telehealth: Payer: Self-pay | Admitting: Radiation Oncology

## 2018-04-02 NOTE — Telephone Encounter (Signed)
Scheduled appt per per sch message for genetics - left message and sent reminder letter in the mail.

## 2018-04-10 ENCOUNTER — Telehealth: Payer: Self-pay | Admitting: Radiation Oncology

## 2018-04-10 NOTE — Telephone Encounter (Signed)
I tried to leave a message on pts home phone but machine was not picking up. I reached him on his cell phone and he would like to proceed with IMRT. I will let Dr. Lovena Neighbours know so that when he's seen on 5/24 perhaps he could have fiducials/spaceOAR placed. I will reach out to him once he's had that appointment to schedule his simulation.

## 2018-04-15 ENCOUNTER — Inpatient Hospital Stay: Payer: BLUE CROSS/BLUE SHIELD

## 2018-04-15 ENCOUNTER — Inpatient Hospital Stay: Payer: BLUE CROSS/BLUE SHIELD | Attending: Genetic Counselor | Admitting: Genetic Counselor

## 2018-04-15 ENCOUNTER — Encounter: Payer: Self-pay | Admitting: Genetic Counselor

## 2018-04-15 DIAGNOSIS — Z8042 Family history of malignant neoplasm of prostate: Secondary | ICD-10-CM | POA: Insufficient documentation

## 2018-04-15 DIAGNOSIS — Z809 Family history of malignant neoplasm, unspecified: Secondary | ICD-10-CM | POA: Diagnosis not present

## 2018-04-15 DIAGNOSIS — Z7183 Encounter for nonprocreative genetic counseling: Secondary | ICD-10-CM

## 2018-04-15 NOTE — Progress Notes (Signed)
[facility]      Initial Visit   Patient Name: [name] Patient DOB: 1946-07-30 Patient Age: 71 y.o. Encounter Date: 04/15/2018  Referring Provider: Ledon Snare, Moreno  Primary Care Provider: Antony Contras, Moreno  Reason for Visit: Evaluate for hereditary susceptibility to cancer    Assessment and Plan:  . [name]'s history is not suggestive of a hereditary predisposition to cancer, but he meets NCCN criteria for genetic testing due to his own history of prostate cancer. He corrected much of his family history of cancer initially reported to his physicians. With regards to prostate cancer, he has a maternal and a paternal first cousin with prostate cancer. There are other relatives with cancers, but he did not know the primary cancers each had.  . Testing is recommended to determine whether he has a pathogenic mutation that will impact his screening and risk-reduction for cancer. A negative result will be reassuring.  . [name] wished to pursue genetic testing and a blood sample will be sent for analysis of the 83 genes on [facility]'s Multi-Cancer panel (ALK, APC, ATM, AXIN2, BAP1, BARD1, BLM, BMPR1A, BRCA1, BRCA2, BRIP1, CASR, CDC73, CDH1, CDK4, CDKN1B, CDKN1C, CDKN2A, CEBPA, CHEK2, CTNNA1, DICER1, DIS3L2, EGFR, EPCAM, FH, FLCN, GATA2, GPC3, GREM1, HOXB13, HRAS, KIT, MAX, MEN1, MET, MITF, MLH1, MSH2, MSH3, MSH6, MUTYH, NBN, NF1, NF2, NTHL1, PALB2, PDGFRA, PHOX2B, PMS2, POLD1, POLE, POT1, PRKAR1A, PTCH1, PTEN, RAD50, RAD51C, RAD51D, RB1, RECQL4, RET, RUNX1, SDHA, SDHAF2, SDHB, SDHC, SDHD, SMAD4, SMARCA4, SMARCB1, SMARCE1, STK11, SUFU, TERC, TERT, TMEM127, TP53, TSC1, TSC2, VHL, WRN, WT1).   . Results should be available in approximately 2-3 weeks, at which point we will contact him and address implications for him as well as address genetic testing for at-risk family members, if needed.     Dr. Doris Moreno Moreno available for questions concerning this case.  Total time spent by me in face-to-face counseling Moreno approximately 30 minutes.   _____________________________________________________________________   History of Present Illness: [name], a 72 y.o. male, is being seen at the [facility] due to a personal and family history of cancer. He presents to clinic today with his wife, [name], to discuss the possibility of a hereditary predisposition to cancer and discuss whether genetic testing is warranted.  [name] diagnosed with prostate cancer initially at age 69. He Moreno then on active surveillance until recently when biopsy showed a Gleason 7 cancer and treatment Moreno recommended. He stated he will be proceeding with radiation.   Past Medical History:  Diagnosis Date  . Acute blood loss anemia 2007, 2010   from GI bleeds, required 2 PRBCs each episode.   Marland Kitchen BPH (benign prostatic hyperplasia)   . ED (erectile dysfunction)   . Family history of cancer   . Family history of prostate cancer   . Glaucoma   . Hemorrhage of colon following colonoscopy 2007   post polypectomy bleed 2007, treated with endo clip and epi injection.  Marland Kitchen HLD (hyperlipidemia)   . HTN (hypertension)   . Hx of adenomatous colonic polyps 2007  . 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 Pneumothorax 2009   spontaneous, treated with chest tube.  Dr [name].  . Prostate cancer [facility])     Past Surgical History:  Procedure Laterality Date  . HERNIA REPAIR    . PROSTATE BIOPSY    . TONSILLECTOMY      Social History   Socioeconomic History  .  Marital status: Married    Spouse name: Not on file  . Number of children: 2  . Years of education: Not on file  . Highest education level: Not on file  Occupational History  . Occupation: Quarry manager: [employer]  Social Needs  . Financial resource strain: Not on file  . Food insecurity:    Worry: Not on file    Inability: Not on file  .  Transportation needs:    Medical: Not on file    Non-medical: Not on file  Tobacco Use  . Smoking status: Never Smoker  . Smokeless tobacco: Current User    Types: Chew  . Tobacco comment: chews tobacco for 60 yrs(grandpa gave it to him) 27oz/week  Substance and Sexual Activity  . Alcohol use: Yes    Alcohol/week: 3.6 oz    Types: 6 Cans of beer per week    Comment: weekends  . Drug use: No  . Sexual activity: Not Currently  Lifestyle  . Physical activity:    Days per week: Not on file    Minutes per session: Not on file  . Stress: Not on file  Relationships  . Social connections:    Talks on phone: Not on file    Gets together: Not on file    Attends religious service: Not on file    Active member of club or organization: Not on file    Attends meetings of clubs or organizations: Not on file    Relationship status: Not on file  Other Topics Concern  . Not on file  Social History Narrative   The patient is married and lives in [place].     Family History:  During the visit, a 4-generation pedigree Moreno obtained. Family tree will be scanned in the Media tab in Epic  Significant diagnoses include the following:  Family History  Problem Relation Age of Onset  . Heart disease Mother   . Diabetes Mother   . Hypertension Mother   . Cancer Father 17       metastatic; unk. primary; deceased 41  . Cancer Maternal Uncle        unk. primary  . Colon cancer Paternal Uncle 6       deceased 8  . Cancer Paternal Aunt        unk. primary; died young  . Prostate cancer Cousin 43       maternal first cousin; son of uncle with metastatic cancer  . Prostate cancer Cousin        paternal first cousin; son of unaffected paternal aunt    Additionally, [name] has a son (age 46) and a daughter (age 82). He has no siblings. His mother died at 98. She had 3 sisters and 2 brothers. His father (noted above) had 2 brothers and 5 sisters.  [name]'s ancestry is African  American. There is no known Jewish ancestry and no consanguinity.  Discussion: We reviewed the characteristics, features and inheritance patterns of hereditary cancer syndromes. We discussed his risk of harboring a mutation in the context of his personal and family history. We discussed the process of genetic testing, insurance coverage and implications of results: positive, negative and variant of unknown significance (VUS).    Mr. Kite's questions were answered to his satisfaction today and he is welcome to call with any additional questions or concerns. Thank you for the referral and allowing [place] to share in the care of your patient.  Steele Berg, MS, [place] Certified Genetic Counselor phone: [phone] [email or web address]

## 2018-04-23 ENCOUNTER — Telehealth: Payer: Self-pay | Admitting: Radiation Oncology

## 2018-04-23 NOTE — Telephone Encounter (Addendum)
LM for the patient to let us know how his appt went last week with Dr. Lovena Neighbours.

## 2018-05-02 ENCOUNTER — Encounter: Payer: Self-pay | Admitting: Genetic Counselor

## 2018-05-02 ENCOUNTER — Ambulatory Visit: Payer: Self-pay | Admitting: Genetic Counselor

## 2018-05-02 DIAGNOSIS — Z1379 Encounter for other screening for genetic and chromosomal anomalies: Secondary | ICD-10-CM

## 2018-05-02 HISTORY — DX: Encounter for other screening for genetic and chromosomal anomalies: Z13.79

## 2018-05-02 NOTE — Progress Notes (Signed)
Cancer Genetics Clinic       Genetic Test Results    Patient Name: [name] Patient DOB: 06-02-1946 Patient Age: 71 y.o. Encounter Date: 05/02/2018  Referring Provider: Ledon Snare, MD  Primary Care Provider: Antony Contras, MD   [name] was called today to discuss genetic test results. Please see the Genetics note from his visit on 04/15/2018 for a detailed discussion of his personal and family history.  Genetic Testing: At the time of [name]'s visit, he decided to pursue genetic testing of multiple genes associated with hereditary susceptibility to cancer. Testing included sequencing and deletion/duplication analysis. Testing did not reveal a pathogenic mutation in any of the genes analyzed.  A copy of the genetic test report will be scanned into Epic under the Media tab.  The genes analyzed were the 83 genes on [facility]'s Multi-Cancer panel (ALK, APC, ATM, AXIN2, BAP1, BARD1, BLM, BMPR1A, BRCA1, BRCA2, BRIP1, CASR, CDC73, CDH1, CDK4, CDKN1B, CDKN1C, CDKN2A, CEBPA, CHEK2, CTNNA1, DICER1, DIS3L2, EGFR, EPCAM, FH, FLCN, GATA2, GPC3, GREM1, HOXB13, HRAS, KIT, MAX, MEN1, MET, MITF, MLH1, MSH2, MSH3, MSH6, MUTYH, NBN, NF1, NF2, NTHL1, PALB2, PDGFRA, PHOX2B, PMS2, POLD1, POLE, POT1, PRKAR1A, PTCH1, PTEN, RAD50, RAD51C, RAD51D, RB1, RECQL4, RET, RUNX1, SDHA, SDHAF2, SDHB, SDHC, SDHD, SMAD4, SMARCA4, SMARCB1, SMARCE1, STK11, SUFU, TERC, TERT, TMEM127, TP53, TSC1, TSC2, VHL, WRN, WT1).  Since the current test is not perfect, it is possible that there may be a gene mutation that current testing cannot detect, but that chance is small. It is possible that a different genetic factor, which has not yet been discovered or is not on this panel, is responsible for the cancer diagnoses in the family. Again, the likelihood of this is low. No additional testing is recommended at this time for [name].  Three Variants of Uncertain Significance were detected: ATM c.7988T>C  (p.Val2663Ala), SDHA c.830C>T (p.Thr277Met) and TSC2 c.4671C>G (p.Ser1557Arg). This is still considered a normal result. While at this time, it is unknown if this finding is associated with increased cancer risk, the majority of these variants get reclassified to be inconsequential. Medical management should not be based on this finding. With time, we suspect the lab will determine the significance, if any. If we do learn more about it, we will try to contact [name] to discuss it further. It is important to stay in touch with [place] periodically and keep the address and phone number up to date.  Cancer Screening: These results suggest that [name]'s cancer was most likely not due to an inherited predisposition. Most cancers happen by chance and this test, along with details of his family history, suggests that his cancer falls into this category. He is recommended to follow the cancer screening guidelines provided by his physician.   Family Members: Family members are at some increased risk of developing cancer, over the general population risk, simply due to the family history. They are recommended to speak with their own providers about appropriate cancer screenings.   Any relative who had cancer at a young age or had a particularly rare cancer may also wish to pursue genetic testing. Genetic counselors can be located in other cities, by visiting the website of the [employer] ([employer].se) and Field seismologist for a Dietitian by zip code.   Family members are not recommended to get tested for the above VUS outside of a research protocol as this finding  has no implications for their medical management.  Lastly, cancer genetics is a rapidly advancing field and it is possible that new genetic tests will be appropriate for [name] in the future. [name] is encouraged to remain in contact with Genetics on an annual basis so we can update his personal and family  histories, and let him know of advances in cancer genetics that may benefit the family. [name]'s questions were answered to his satisfaction today, and he knows he is welcome to call anytime with additional questions.     Garrett Berg, MS, [place] Certified Genetic Counselor phone: [phone]

## 2018-05-14 ENCOUNTER — Telehealth: Payer: Self-pay | Admitting: Radiation Oncology

## 2018-05-14 NOTE — Telephone Encounter (Signed)
I called the patient and he states that he is waiting to hear back about getting his fiducial markers and space or gel placed in the operating room with Dr. Lovena Neighbours.  He is hoping to hear something by this week.  I will follow-up with him next week to ensure that this is been scheduled so that we can move forward in scheduling his simulation and subsequent radiation treatment.

## 2018-05-16 ENCOUNTER — Other Ambulatory Visit: Payer: Self-pay | Admitting: Urology

## 2018-05-16 ENCOUNTER — Telehealth: Payer: Self-pay | Admitting: *Deleted

## 2018-05-16 NOTE — Telephone Encounter (Signed)
Called patient to inform of fid. Marker and space oar placement for 06-26-18 @ 9 am @ [place], and his sim appt. on Aug. 2, lvm for a return call

## 2018-05-23 ENCOUNTER — Other Ambulatory Visit: Payer: Self-pay | Admitting: Urology

## 2018-05-23 DIAGNOSIS — C61 Malignant neoplasm of prostate: Secondary | ICD-10-CM

## 2018-06-19 NOTE — Progress Notes (Signed)
Short Term Disability paperwork completed and patient requested to pick up at cancer center. Taken copy to Ms. Wilma at front desk for patient to pick up.

## 2018-06-21 ENCOUNTER — Encounter (HOSPITAL_BASED_OUTPATIENT_CLINIC_OR_DEPARTMENT_OTHER): Payer: Self-pay | Admitting: *Deleted

## 2018-06-21 ENCOUNTER — Other Ambulatory Visit: Payer: Self-pay

## 2018-06-21 NOTE — Progress Notes (Signed)
Spoke w/ pt via phone for pre-op interview.  Npo after mn, pt verbalized understanding this includes no chew tobacco.  Arrive at 0700.  Needs istat and ekg.  Will take am meds dos w/ sips of water with exception no lisinopril/hctz.

## 2018-06-25 NOTE — H&P (Signed)
Urology Preoperative H&P   Chief Complaint: prostate cancer  History of Present Illness: [name] is a 72 y.o. male with a history of low volume Gleason 6 prostate cancer initially diagnosed in April of 2015. His initial PSA was 4.84 at the time of diagnosis. He had a repeat prostate biopsy in Feb 2018 that came back positive for Gleason 6 disease involving 3/12 sextants on the left base and mid portions and was continued on AS. Prostate volume was ~90 on TRUSP.   Last PSA: 9.45 (12/31/17). Rising. Was 8.12 (06/2017).   He is s/p MRI guided PNBx on 02/25/18. Pathology- Gleason 3+4 prostate cancer within the ROI (5/5 samples positive), Gleason 3+3 (4/12 at the left base and right apex).   AUASS-11 (Currently on Oxybutynin 10 mg ER and tamsulosin 0.4 mg once daily)  SHIM-5 (not currently taking PDE5i)   He has met with Dr. Tammi Klippel and has decided to go with external beam radiation as his primary treatment of his prostate cancer.   He is here today for gold seed fiducial markers and SpaceOAR placement   Past Medical History:  Diagnosis Date  . Arthritis   . ED (erectile dysfunction)   . Family history of cancer   . Family history of prostate cancer   . Genetic testing 05/02/2018   Multi-Cancer panel (83 genes) @ [facility] - No pathogenic mutations detected  . History of anemia    due to acute blood loss from lower GI bleeds 2007, 2010, 2013  . History of GI diverticular bleed    tranfused 03/ 2010, 01/ 2013/  no blood needed 09/ 2014, 2015  . History of lower GI bleeding 08/2006   post colonoscopy w/ polypectomy -- treatment colonoscopy w/ endo clip at polypectomy site and 2PRBCs  . History of pneumothorax 07/2008   spontaneous-- tx chest tube  . HLD (hyperlipidemia)   . HTN (hypertension)   . Hx of adenomatous colonic polyps   . Hyperplasia of prostate with lower urinary tract symptoms (LUTS)   . Mild obstructive sleep apnea    06-21-2018 per pt study done 8 yrs ago, told borderline  osa , no cpap  . Primary hypogonadism in male   . Prostate cancer [facility]) urologist-  dr Senai Kingsley/  oncologist-  dr Tammi Klippel   first dx 04/ 2015  Stage T1c, PSA 4.94;  last prostate bx 02-25-2018  Stage T1c, Gleason 3+4, PSA 9.45, vol 120cc-- planned treatement external beam radiation  . Wears glasses     Past Surgical History:  Procedure Laterality Date  . COLONOSCOPY W/ POLYPECTOMY  last one 2014  . PROSTATE BIOPSY  last one 02-25-2018   dr Malachy Coleman office  . TONSILLECTOMY  child  . UMBILICAL HERNIA REPAIR  2012    Allergies:  Allergies  Allergen Reactions  . Bee Venom Anaphylaxis    Foaming at mouth, unresponsive  . Penicillins Itching and Rash    Family History  Problem Relation Age of Onset  . Heart disease Mother   . Diabetes Mother   . Hypertension Mother   . Cancer Father 40       metastatic; unk. primary; deceased 62  . Cancer Maternal Uncle        unk. primary  . Colon cancer Paternal Uncle 23       deceased 23  . Cancer Paternal Aunt        unk. primary; died young  . Prostate cancer Cousin 22       maternal first  cousin; son of uncle with metastatic cancer  . Prostate cancer Cousin        paternal first cousin; son of unaffected paternal aunt    Social History:  reports that he has never smoked. His smokeless tobacco use includes chew. He reports that he drinks alcohol. He reports that he does not use drugs.  ROS: A complete review of systems was performed.  All systems are negative except for pertinent findings as noted.  Physical Exam:  Vital signs in last 24 hours:   Constitutional:  Alert and oriented, No acute distress Cardiovascular: Regular rate and rhythm, No JVD Respiratory: Normal respiratory effort, Lungs clear bilaterally GI: Abdomen is soft, nontender, nondistended, no abdominal masses GU: No CVA tenderness Lymphatic: No lymphadenopathy Neurologic: Grossly intact, no focal deficits Psychiatric: Normal mood and affect  Laboratory Data:  No  results for input(s): WBC, HGB, HCT, PLT in the last 72 hours.  No results for input(s): NA, K, CL, GLUCOSE, BUN, CALCIUM, CREATININE in the last 72 hours.  Invalid input(s): CO3   No results found for this or any previous visit (from the past 24 hour(s)). No results found for this or any previous visit (from the past 240 hour(s)).  Renal Function: No results for input(s): CREATININE in the last 168 hours. CrCl cannot be calculated (Patient's most recent lab result is older than the maximum 21 days allowed.).  Radiologic Imaging: No results found.  I independently reviewed the above imaging studies.  Assessment and Plan [name] is a 72 y.o. male with Gleason 3+4 prostate adenocarcinoma  The risk, benefits and alternatives of transrectal ultrasound-guided gold fiducial marker placement and SpaceOAR placement was discussed with the patient.  Risks include, but are not limited to bleeding, urinary tract infection, lower urinary tract symptoms, urinary retention and the inherent risk of general anesthesia.  He voices understanding wishes to proceed.  Ellison Hughs, MD 06/25/2018, 9:58 AM  [facility] Pager: [phone]

## 2018-06-26 ENCOUNTER — Ambulatory Visit (HOSPITAL_BASED_OUTPATIENT_CLINIC_OR_DEPARTMENT_OTHER)
Admission: RE | Admit: 2018-06-26 | Discharge: 2018-06-26 | Disposition: A | Discharge status: Home / Self Care | Payer: BLUE CROSS/BLUE SHIELD | Source: Ambulatory Visit | Attending: Urology | Admitting: Urology

## 2018-06-26 ENCOUNTER — Telehealth: Payer: Self-pay | Admitting: *Deleted

## 2018-06-26 ENCOUNTER — Ambulatory Visit (HOSPITAL_BASED_OUTPATIENT_CLINIC_OR_DEPARTMENT_OTHER): Payer: BLUE CROSS/BLUE SHIELD | Admitting: Anesthesiology

## 2018-06-26 ENCOUNTER — Encounter (HOSPITAL_BASED_OUTPATIENT_CLINIC_OR_DEPARTMENT_OTHER)
Admission: RE | Disposition: A | Discharge status: Home / Self Care | Payer: Self-pay | Source: Ambulatory Visit | Attending: Urology

## 2018-06-26 ENCOUNTER — Encounter (HOSPITAL_BASED_OUTPATIENT_CLINIC_OR_DEPARTMENT_OTHER): Payer: Self-pay | Admitting: *Deleted

## 2018-06-26 DIAGNOSIS — G4733 Obstructive sleep apnea (adult) (pediatric): Secondary | ICD-10-CM | POA: Insufficient documentation

## 2018-06-26 DIAGNOSIS — Z79899 Other long term (current) drug therapy: Secondary | ICD-10-CM | POA: Diagnosis not present

## 2018-06-26 DIAGNOSIS — C61 Malignant neoplasm of prostate: Secondary | ICD-10-CM | POA: Insufficient documentation

## 2018-06-26 DIAGNOSIS — Z720 Tobacco use: Secondary | ICD-10-CM | POA: Diagnosis not present

## 2018-06-26 DIAGNOSIS — I10 Essential (primary) hypertension: Secondary | ICD-10-CM | POA: Diagnosis not present

## 2018-06-26 DIAGNOSIS — E785 Hyperlipidemia, unspecified: Secondary | ICD-10-CM | POA: Diagnosis not present

## 2018-06-26 HISTORY — DX: Personal history of other diseases of the respiratory system: Z87.09

## 2018-06-26 HISTORY — DX: Presence of spectacles and contact lenses: Z97.3

## 2018-06-26 HISTORY — DX: Unspecified osteoarthritis, unspecified site: M19.90

## 2018-06-26 HISTORY — DX: Benign prostatic hyperplasia with lower urinary tract symptoms: N40.1

## 2018-06-26 HISTORY — DX: Personal history of other diseases of the digestive system: Z87.19

## 2018-06-26 HISTORY — DX: Personal history of diseases of the blood and blood-forming organs and certain disorders involving the immune mechanism: Z86.2

## 2018-06-26 HISTORY — DX: Testicular hypofunction: E29.1

## 2018-06-26 HISTORY — DX: Obstructive sleep apnea (adult) (pediatric): G47.33

## 2018-06-26 HISTORY — PX: GOLD SEED IMPLANT: SHX6343

## 2018-06-26 HISTORY — PX: SPACE OAR INSTILLATION: SHX6769

## 2018-06-26 LAB — POCT I-STAT, CHEM 8
BUN: 18 mg/dL (ref 8–23)
CREATININE: 1.2 mg/dL (ref 0.61–1.24)
Calcium, Ion: 1.27 mmol/L (ref 1.15–1.40)
Chloride: 99 mmol/L (ref 98–111)
Glucose, Bld: 96 mg/dL (ref 70–99)
HEMATOCRIT: 44 % (ref 39.0–52.0)
HEMOGLOBIN: 15 g/dL (ref 13.0–17.0)
POTASSIUM: 4.2 mmol/L (ref 3.5–5.1)
SODIUM: 139 mmol/L (ref 135–145)
TCO2: 32 mmol/L (ref 22–32)

## 2018-06-26 SURGERY — INSERTION, GOLD SEEDS
Anesthesia: Monitor Anesthesia Care

## 2018-06-26 MED ORDER — ONDANSETRON HCL 4 MG/2ML IJ SOLN
INTRAMUSCULAR | Status: DC | PRN
  Administered 2018-06-26: 4 mg via INTRAVENOUS

## 2018-06-26 MED ORDER — TRAMADOL HCL 50 MG PO TABS: 50.0000 mg | ORAL_TABLET | Freq: Four times a day (QID) | ORAL | 0 refills | Status: AC | PRN

## 2018-06-26 MED ORDER — OXYCODONE HCL 5 MG/5ML PO SOLN
5.0000 mg | Freq: Once | ORAL | Status: DC | PRN
  Filled 2018-06-26: qty 5

## 2018-06-26 MED ORDER — CIPROFLOXACIN IN D5W 400 MG/200ML IV SOLN
INTRAVENOUS | Status: AC
  Filled 2018-06-26: qty 200

## 2018-06-26 MED ORDER — PROMETHAZINE HCL 25 MG/ML IJ SOLN
6.2500 mg | INTRAMUSCULAR | Status: DC | PRN
Start: 2018-06-26 — End: 2018-06-26
  Filled 2018-06-26: qty 1

## 2018-06-26 MED ORDER — FENTANYL CITRATE (PF) 100 MCG/2ML IJ SOLN
INTRAMUSCULAR | Status: DC | PRN
  Administered 2018-06-26: 50 ug via INTRAVENOUS

## 2018-06-26 MED ORDER — SUGAMMADEX SODIUM 200 MG/2ML IV SOLN
INTRAVENOUS | Status: AC
  Filled 2018-06-26: qty 2

## 2018-06-26 MED ORDER — FENTANYL CITRATE (PF) 100 MCG/2ML IJ SOLN
25.0000 ug | INTRAMUSCULAR | Status: DC | PRN
  Filled 2018-06-26: qty 1

## 2018-06-26 MED ORDER — CIPROFLOXACIN IN D5W 400 MG/200ML IV SOLN
400.0000 mg | Freq: Once | INTRAVENOUS | Status: AC
  Administered 2018-06-26: 400 mg via INTRAVENOUS
  Filled 2018-06-26: qty 200

## 2018-06-26 MED ORDER — PROPOFOL 500 MG/50ML IV EMUL
INTRAVENOUS | Status: DC | PRN
  Administered 2018-06-26: 200 ug/kg/min via INTRAVENOUS
  Administered 2018-06-26: 100 ug/kg/min via INTRAVENOUS

## 2018-06-26 MED ORDER — BUPIVACAINE HCL (PF) 0.25 % IJ SOLN
INTRAMUSCULAR | Status: DC | PRN
  Administered 2018-06-26: 3 mL

## 2018-06-26 MED ORDER — LACTATED RINGERS IV SOLN
INTRAVENOUS | Status: DC
  Administered 2018-06-26: 08:00:00 via INTRAVENOUS
  Filled 2018-06-26: qty 1000

## 2018-06-26 MED ORDER — MIDAZOLAM HCL 2 MG/2ML IJ SOLN
INTRAMUSCULAR | Status: AC
  Filled 2018-06-26: qty 2

## 2018-06-26 MED ORDER — MIDAZOLAM HCL 2 MG/2ML IJ SOLN
INTRAMUSCULAR | Status: DC | PRN
  Administered 2018-06-26: 2 mg via INTRAVENOUS

## 2018-06-26 MED ORDER — SODIUM CHLORIDE FLUSH 0.9 % IV SOLN
INTRAVENOUS | Status: DC | PRN
  Administered 2018-06-26: 10 mL

## 2018-06-26 MED ORDER — LIDOCAINE 2% (20 MG/ML) 5 ML SYRINGE
INTRAMUSCULAR | Status: AC
  Filled 2018-06-26: qty 5

## 2018-06-26 MED ORDER — ONDANSETRON HCL 4 MG/2ML IJ SOLN
INTRAMUSCULAR | Status: AC
  Filled 2018-06-26: qty 2

## 2018-06-26 MED ORDER — FENTANYL CITRATE (PF) 100 MCG/2ML IJ SOLN
INTRAMUSCULAR | Status: AC
  Filled 2018-06-26: qty 2

## 2018-06-26 MED ORDER — OXYCODONE HCL 5 MG PO TABS
5.0000 mg | ORAL_TABLET | Freq: Once | ORAL | Status: DC | PRN
  Filled 2018-06-26: qty 1

## 2018-06-26 MED ORDER — PROPOFOL 10 MG/ML IV BOLUS
INTRAVENOUS | Status: AC
  Filled 2018-06-26: qty 40

## 2018-06-26 MED ORDER — LIDOCAINE 2% (20 MG/ML) 5 ML SYRINGE
INTRAMUSCULAR | Status: DC | PRN
  Administered 2018-06-26: 50 mg via INTRAVENOUS

## 2018-06-26 SURGICAL SUPPLY — 13 items
COVER TABLE BACK 60X90 (DRAPES) ×3 IMPLANT
DRSG TEGADERM 4X4.75 (GAUZE/BANDAGES/DRESSINGS) ×4 IMPLANT
DRSG TEGADERM 8X12 (GAUZE/BANDAGES/DRESSINGS) ×6 IMPLANT
GAUZE SPONGE 4X4 12PLY STRL (GAUZE/BANDAGES/DRESSINGS) ×2 IMPLANT
GLOVE BIO SURGEON STRL SZ7.5 (GLOVE) ×3 IMPLANT
GLOVE ECLIPSE 8.0 STRL XLNG CF (GLOVE) ×3 IMPLANT
GOWN STRL REUS W/TWL XL LVL3 (GOWN DISPOSABLE) ×3 IMPLANT
IMPL SPACEOAR SYSTEM 10ML (MISCELLANEOUS) ×1 IMPLANT
IMPLANT SPACEOAR SYSTEM 10ML (MISCELLANEOUS) ×3
KIT TURNOVER CYSTO (KITS) ×3 IMPLANT
PACK CYSTO (CUSTOM PROCEDURE TRAY) ×3 IMPLANT
SURGILUBE 2OZ TUBE FLIPTOP (MISCELLANEOUS) ×3 IMPLANT
UNDERPAD 30X30 (UNDERPADS AND DIAPERS) ×6 IMPLANT

## 2018-06-26 NOTE — Anesthesia Procedure Notes (Signed)
Procedure Name: MAC Date/Time: 06/26/2018 9:26 AM Performed by: Wanita Chamberlain, CRNA Pre-anesthesia Checklist: Patient identified, Emergency Drugs available, Suction available, Patient being monitored and Timeout performed Patient Re-evaluated:Patient Re-evaluated prior to induction Oxygen Delivery Method: Nasal cannula Induction Type: IV induction

## 2018-06-26 NOTE — Anesthesia Preprocedure Evaluation (Addendum)
Anesthesia Evaluation  Patient identified by MRN, date of birth, ID band Patient awake    Reviewed: Allergy & Precautions, NPO status , Patient's Chart, lab work & pertinent test results  Airway Mallampati: II  TM Distance: >3 FB Neck ROM: Full    Dental no notable dental hx. (+) Teeth Intact, Dental Advisory Given, Missing,    Pulmonary sleep apnea ,    Pulmonary exam normal breath sounds clear to auscultation       Cardiovascular hypertension, Normal cardiovascular exam Rhythm:Regular Rate:Normal     Neuro/Psych negative neurological ROS  negative psych ROS   GI/Hepatic negative GI ROS, Neg liver ROS,   Endo/Other  negative endocrine ROS  Renal/GU negative Renal ROS  negative genitourinary   Musculoskeletal negative musculoskeletal ROS (+)   Abdominal   Peds negative pediatric ROS (+)  Hematology negative hematology ROS (+)   Anesthesia Other Findings   Reproductive/Obstetrics negative OB ROS                            Anesthesia Physical Anesthesia Plan  ASA: II  Anesthesia Plan: MAC   Post-op Pain Management:    Induction: Intravenous  PONV Risk Score and Plan: 0  Airway Management Planned: Simple Face Mask  Additional Equipment:   Intra-op Plan:   Post-operative Plan:   Informed Consent: I have reviewed the patients History and Physical, chart, labs and discussed the procedure including the risks, benefits and alternatives for the proposed anesthesia with the patient or authorized representative who has indicated his/her understanding and acceptance.   Dental advisory given  Plan Discussed with: CRNA and Surgeon  Anesthesia Plan Comments:         Anesthesia Quick Evaluation

## 2018-06-26 NOTE — Telephone Encounter (Signed)
Called patient to inform of MRI for 07-01-18 - arrival time - 6:30 am @ [facility], no restrictions to test, spoke with patient's wife and she is aware of this test

## 2018-06-26 NOTE — Discharge Instructions (Signed)
Call your surgeon if you experience:   1.  Fever over 101.0. 2.  Inability to urinate. 3.  Nausea and/or vomiting. 4.  Extreme swelling or bruising at the surgical site. 5.  Continued bleeding from the incision. 6.  Increased pain, redness or drainage from the incision. 7.  Problems related to your pain medication. 8.  Any problems and/or concerns. 9.  You may remove your dressing tomorrow.  Post Anesthesia Home Care Instructions  Activity: Get plenty of rest for the remainder of the day. A responsible individual must stay with you for 24 hours following the procedure.  For the next 24 hours, DO NOT: -Drive a car -Paediatric nurse -Drink alcoholic beverages -Take any medication unless instructed by your physician -Make any legal decisions or sign important papers.  Meals: Start with liquid foods such as gelatin or soup. Progress to regular foods as tolerated. Avoid greasy, spicy, heavy foods. If nausea and/or vomiting occur, drink only clear liquids until the nausea and/or vomiting subsides. Call your physician if vomiting continues.  Special Instructions/Symptoms: Your throat may feel dry or sore from the anesthesia or the breathing tube placed in your throat during surgery. If this causes discomfort, gargle with warm salt water. The discomfort should disappear within 24 hours.

## 2018-06-26 NOTE — Op Note (Addendum)
Operative Note  Preoperative diagnosis:  1.  Gleason 3+4 prostate cancer  Postoperative diagnosis: 1.  Same   Procedure(s): 1.  Transrectal ultrasound guided prostatic gold seed fiducial marker and Space OAR placement  Surgeon: Ellison Hughs, MD  Assistants:  None   Anesthesia:  MAC  Complications:  None  EBL:  <5 mL  Specimens: 1. None  Drains/Catheters: 1.  None  Intraoperative findings:   1. Fiducial markers were placed at the prostatic base, mid-gland and apex.  SpaceOAR was placed with good separation of the prostate and rectum.   Indication:  [name] is a 72 y.o. male with a history of low volume Gleason 6 prostate cancer initially diagnosed in April of 2015. His initial PSA was 4.84 at the time of diagnosis. He had a repeat prostate biopsy in Feb 2018 that came back positive for Gleason 6 disease involving 3/12 sextants on the left base and mid portions and was continued on AS. Prostate volume was ~90 on TRUSP.   Last PSA: 9.45 (12/31/17). Rising. Was 8.12 (06/2017).   He is s/p MRI guided PNBx on 02/25/18. Pathology- Gleason 3+4 prostate cancer within the ROI (5/5 samples positive), Gleason 3+3 (4/12 at the left base and right apex).   He has met with Dr. Tammi Klippel and has decided to go with external beam radiation as his primary treatment of his prostate cancer.   He is here today for gold seed fiducial markers and SpaceOAR placement  Description of procedure:  After informed consent the patient was brought to the major OR, placed on the table and administered general anesthesia. He was then moved to the modified lithotomy position with his perineum perpendicular to the floor. His perineum and genitalia were then sterilely prepped. An official timeout was then performed.   Real time transrectal ultrasonography was used visualize the prostate.  Gold seed fiducial markers were then placed transperineally at the prostatic base, mid gland and apex.  I then  proceeded with placement of SpaceOAR by introducing a needle with the bevel angled inferiorly approximately 2 cm superior to the anus. This was angled downward and under direct ultrasound was placed within the space between the prostatic capsule and rectum. This was confirmed with a small amount of sterile saline injected and this was performed under direct ultrasound. I then attached the SpaceOAR to the needle and injected this in the space between the prostate and rectum with good placement noted.  The patient tolerated the procedure well and was transferred to the postanesthesia in stable condition.  Plan: Follow-up in 4 months with PSA

## 2018-06-26 NOTE — Anesthesia Postprocedure Evaluation (Signed)
Anesthesia Post Note  Patient: [name]  Procedure(s) Performed: GOLD SEED IMPLANT (N/A ) SPACE OAR INSTILLATION (N/A )     Patient location during evaluation: PACU Anesthesia Type: MAC Level of consciousness: awake and alert Pain management: pain level controlled Vital Signs Assessment: post-procedure vital signs reviewed and stable Respiratory status: spontaneous breathing, nonlabored ventilation, respiratory function stable and patient connected to nasal cannula oxygen Cardiovascular status: stable and blood pressure returned to baseline Postop Assessment: no apparent nausea or vomiting Anesthetic complications: no    Last Vitals:  Vitals:   06/26/18 1000 06/26/18 1015  BP: 122/73 122/72  Pulse: (!) 54 (!) 56  Resp: 13 15  Temp:    SpO2: 100% 100%    Last Pain:  Vitals:   06/26/18 1015  TempSrc:   PainSc: Asleep                 Malick Netz S

## 2018-06-26 NOTE — Transfer of Care (Signed)
Immediate Anesthesia Transfer of Care Note  Patient: [name]  Procedure(s) Performed: GOLD SEED IMPLANT (N/A ) SPACE OAR INSTILLATION (N/A )  Patient Location: PACU  Anesthesia Type:MAC  Level of Consciousness: awake, alert , oriented and patient cooperative  Airway & Oxygen Therapy: Patient Spontanous Breathing and Patient connected to nasal cannula oxygen  Post-op Assessment: Report given to RN and Post -op Vital signs reviewed and stable  Post vital signs: Reviewed and stable  Last Vitals:  Vitals Value Taken Time  BP    Temp    Pulse 59 06/26/2018  9:52 AM  Resp 15 06/26/2018  9:52 AM  SpO2 100 % 06/26/2018  9:52 AM  Vitals shown include unvalidated device data.  Last Pain:  Vitals:   06/26/18 0704  TempSrc: Oral         Complications: No apparent anesthesia complications

## 2018-06-27 ENCOUNTER — Telehealth: Payer: Self-pay | Admitting: Radiation Oncology

## 2018-06-27 NOTE — Telephone Encounter (Signed)
Received a call from [facility] requesting a return call about patient's disability claim. Returned call promptly and answered questions asked about surgery, start of radiation therapy and potential last day of radiation therapy. Did not provide an estimated return to work date.

## 2018-06-28 ENCOUNTER — Ambulatory Visit: Payer: Medicare Other | Admitting: Radiation Oncology

## 2018-06-28 ENCOUNTER — Encounter (HOSPITAL_BASED_OUTPATIENT_CLINIC_OR_DEPARTMENT_OTHER): Payer: Self-pay | Admitting: Urology

## 2018-06-30 NOTE — Progress Notes (Signed)
  Radiation Oncology         [phone] ________________________________  Name: [name] MRN: [id number]  Date: 07/01/2018  DOB: 1946/11/12  SIMULATION AND TREATMENT PLANNING NOTE    ICD-10-CM   1. Malignant neoplasm of prostate ([place]) C61     DIAGNOSIS:  72 y.o. gentleman with Stage T1c adenocarcinoma of the prostate with Gleason Score of 3+4, and PSA of 9.45  NARRATIVE:  The patient was brought to the [place].  Identity was confirmed.  All relevant records and images related to the planned course of therapy were reviewed.  The patient freely provided informed written consent to proceed with treatment after reviewing the details related to the planned course of therapy. The consent form was witnessed and verified by the simulation staff.  Then, the patient was set-up in a stable reproducible supine position for radiation therapy.  A vacuum lock pillow device was custom fabricated to position his legs in a reproducible immobilized position.  Then, I performed a urethrogram under sterile conditions to identify the prostatic apex.  CT images were obtained.  Surface markings were placed.  The CT images were loaded into the planning software.  Then the prostate target and avoidance structures including the rectum, bladder, bowel and hips were contoured.  Treatment planning then occurred.  The radiation prescription was entered and confirmed.  A total of one complex treatment devices was fabricated. I have requested : Intensity Modulated Radiotherapy (IMRT) is medically necessary for this case for the following reason:  Rectal sparing.Marland Kitchen  PLAN:  The patient will receive 70 Gy in 28 fractions.  ________________________________  Sheral Apley Tammi Klippel, M.D.

## 2018-07-01 ENCOUNTER — Encounter: Payer: Self-pay | Admitting: Medical Oncology

## 2018-07-01 ENCOUNTER — Ambulatory Visit (HOSPITAL_COMMUNITY)
Admission: RE | Admit: 2018-07-01 | Discharge: 2018-07-01 | Disposition: A | Discharge status: Home / Self Care | Payer: BLUE CROSS/BLUE SHIELD | Source: Ambulatory Visit | Attending: Urology | Admitting: Urology

## 2018-07-01 ENCOUNTER — Ambulatory Visit
Admission: RE | Admit: 2018-07-01 | Discharge: 2018-07-01 | Disposition: A | Discharge status: Home / Self Care | Payer: BLUE CROSS/BLUE SHIELD | Source: Ambulatory Visit | Attending: Radiation Oncology | Admitting: Radiation Oncology

## 2018-07-01 DIAGNOSIS — Z510 Encounter for antineoplastic radiation therapy: Secondary | ICD-10-CM | POA: Diagnosis not present

## 2018-07-01 DIAGNOSIS — C61 Malignant neoplasm of prostate: Secondary | ICD-10-CM | POA: Diagnosis present

## 2018-07-05 DIAGNOSIS — C61 Malignant neoplasm of prostate: Secondary | ICD-10-CM | POA: Diagnosis not present

## 2018-07-09 ENCOUNTER — Encounter: Payer: Self-pay | Admitting: Medical Oncology

## 2018-07-09 ENCOUNTER — Ambulatory Visit
Admission: RE | Admit: 2018-07-09 | Discharge: 2018-07-09 | Disposition: A | Discharge status: Home / Self Care | Payer: BLUE CROSS/BLUE SHIELD | Source: Ambulatory Visit | Attending: Radiation Oncology | Admitting: Radiation Oncology

## 2018-07-09 DIAGNOSIS — C61 Malignant neoplasm of prostate: Secondary | ICD-10-CM | POA: Diagnosis not present

## 2018-07-10 ENCOUNTER — Ambulatory Visit
Admission: RE | Admit: 2018-07-10 | Discharge: 2018-07-10 | Disposition: A | Discharge status: Home / Self Care | Payer: BLUE CROSS/BLUE SHIELD | Source: Ambulatory Visit | Attending: Radiation Oncology | Admitting: Radiation Oncology

## 2018-07-10 DIAGNOSIS — C61 Malignant neoplasm of prostate: Secondary | ICD-10-CM | POA: Diagnosis not present

## 2018-07-11 ENCOUNTER — Ambulatory Visit
Admission: RE | Admit: 2018-07-11 | Discharge: 2018-07-11 | Disposition: A | Discharge status: Home / Self Care | Payer: BLUE CROSS/BLUE SHIELD | Source: Ambulatory Visit | Attending: Radiation Oncology | Admitting: Radiation Oncology

## 2018-07-11 DIAGNOSIS — C61 Malignant neoplasm of prostate: Secondary | ICD-10-CM | POA: Diagnosis not present

## 2018-07-12 ENCOUNTER — Ambulatory Visit
Admission: RE | Admit: 2018-07-12 | Discharge: 2018-07-12 | Disposition: A | Discharge status: Home / Self Care | Payer: BLUE CROSS/BLUE SHIELD | Source: Ambulatory Visit | Attending: Radiation Oncology | Admitting: Radiation Oncology

## 2018-07-12 DIAGNOSIS — C61 Malignant neoplasm of prostate: Secondary | ICD-10-CM | POA: Diagnosis not present

## 2018-07-14 ENCOUNTER — Ambulatory Visit: Admission: RE | Admit: 2018-07-14 | Payer: BLUE CROSS/BLUE SHIELD | Source: Ambulatory Visit

## 2018-07-15 ENCOUNTER — Ambulatory Visit
Admission: RE | Admit: 2018-07-15 | Discharge: 2018-07-15 | Disposition: A | Discharge status: Home / Self Care | Payer: BLUE CROSS/BLUE SHIELD | Source: Ambulatory Visit | Attending: Radiation Oncology | Admitting: Radiation Oncology

## 2018-07-15 DIAGNOSIS — C61 Malignant neoplasm of prostate: Secondary | ICD-10-CM | POA: Diagnosis not present

## 2018-07-16 ENCOUNTER — Ambulatory Visit
Admission: RE | Admit: 2018-07-16 | Discharge: 2018-07-16 | Disposition: A | Discharge status: Home / Self Care | Payer: BLUE CROSS/BLUE SHIELD | Source: Ambulatory Visit | Attending: Radiation Oncology | Admitting: Radiation Oncology

## 2018-07-16 DIAGNOSIS — C61 Malignant neoplasm of prostate: Secondary | ICD-10-CM | POA: Diagnosis not present

## 2018-07-17 ENCOUNTER — Ambulatory Visit
Admission: RE | Admit: 2018-07-17 | Discharge: 2018-07-17 | Disposition: A | Discharge status: Home / Self Care | Payer: BLUE CROSS/BLUE SHIELD | Source: Ambulatory Visit | Attending: Radiation Oncology | Admitting: Radiation Oncology

## 2018-07-17 DIAGNOSIS — C61 Malignant neoplasm of prostate: Secondary | ICD-10-CM | POA: Diagnosis not present

## 2018-07-18 ENCOUNTER — Ambulatory Visit
Admission: RE | Admit: 2018-07-18 | Discharge: 2018-07-18 | Disposition: A | Discharge status: Home / Self Care | Payer: BLUE CROSS/BLUE SHIELD | Source: Ambulatory Visit | Attending: Radiation Oncology | Admitting: Radiation Oncology

## 2018-07-18 DIAGNOSIS — C61 Malignant neoplasm of prostate: Secondary | ICD-10-CM | POA: Diagnosis not present

## 2018-07-18 NOTE — Progress Notes (Signed)
Received patient in the clinic following 7th radiation treatment to prostate. Patient accompanied by wife. Weight and vitals stable. Denies pain. Reports he has been unable to void since 1630 the day prior. After further discussion discovered patient passes drops of urine despite intense urge to void. Patient reports new onset urinary leakage. Denies dysuria or hematuria. Reports nocturia every 15-30 minutes last night. Reports he took his Flomax (one tablet) "as he always does" between 0730-0800. Denies any relief of LUTS after taking Flomax. Informed Ashlyn Bruning, PA-C of these findings. Per her direction I phoned the triage line at [facility] concerned the patient may be in acute urinary retention. Mickel Baas at [facility] agreed to see patient at 1015. Patient sent directly over. Phoned patient's home later the same day. Spoke with patient's wife. She reports the patient was given several new medications but did not have to be sent home with a urinary catheter in place. Findings reported to [employer], PA-C.

## 2018-07-19 ENCOUNTER — Ambulatory Visit
Admission: RE | Admit: 2018-07-19 | Discharge: 2018-07-19 | Disposition: A | Discharge status: Home / Self Care | Payer: BLUE CROSS/BLUE SHIELD | Source: Ambulatory Visit | Attending: Radiation Oncology | Admitting: Radiation Oncology

## 2018-07-19 ENCOUNTER — Other Ambulatory Visit: Payer: Self-pay | Admitting: Urology

## 2018-07-19 DIAGNOSIS — C61 Malignant neoplasm of prostate: Secondary | ICD-10-CM | POA: Diagnosis not present

## 2018-07-19 MED ORDER — PHENAZOPYRIDINE HCL 200 MG PO TABS: 200.0000 mg | ORAL_TABLET | Freq: Three times a day (TID) | ORAL | 1 refills | Status: DC | PRN

## 2018-07-19 MED FILL — PHENAZOPYRIDINE 200 MG TAB: 200 | 10 days supply | Qty: 30 | Fill #0

## 2018-07-22 ENCOUNTER — Ambulatory Visit
Admission: RE | Admit: 2018-07-22 | Discharge: 2018-07-22 | Disposition: A | Discharge status: Home / Self Care | Payer: BLUE CROSS/BLUE SHIELD | Source: Ambulatory Visit | Attending: Radiation Oncology | Admitting: Radiation Oncology

## 2018-07-22 DIAGNOSIS — C61 Malignant neoplasm of prostate: Secondary | ICD-10-CM | POA: Diagnosis not present

## 2018-07-23 ENCOUNTER — Ambulatory Visit
Admission: RE | Admit: 2018-07-23 | Discharge: 2018-07-23 | Disposition: A | Discharge status: Home / Self Care | Payer: BLUE CROSS/BLUE SHIELD | Source: Ambulatory Visit | Attending: Radiation Oncology | Admitting: Radiation Oncology

## 2018-07-23 DIAGNOSIS — C61 Malignant neoplasm of prostate: Secondary | ICD-10-CM | POA: Diagnosis not present

## 2018-07-24 ENCOUNTER — Ambulatory Visit
Admission: RE | Admit: 2018-07-24 | Discharge: 2018-07-24 | Disposition: A | Discharge status: Home / Self Care | Payer: BLUE CROSS/BLUE SHIELD | Source: Ambulatory Visit | Attending: Radiation Oncology | Admitting: Radiation Oncology

## 2018-07-24 DIAGNOSIS — C61 Malignant neoplasm of prostate: Secondary | ICD-10-CM | POA: Diagnosis not present

## 2018-07-25 ENCOUNTER — Ambulatory Visit
Admission: RE | Admit: 2018-07-25 | Discharge: 2018-07-25 | Disposition: A | Discharge status: Home / Self Care | Payer: BLUE CROSS/BLUE SHIELD | Source: Ambulatory Visit | Attending: Radiation Oncology | Admitting: Radiation Oncology

## 2018-07-25 DIAGNOSIS — C61 Malignant neoplasm of prostate: Secondary | ICD-10-CM | POA: Diagnosis not present

## 2018-07-26 ENCOUNTER — Ambulatory Visit
Admission: RE | Admit: 2018-07-26 | Discharge: 2018-07-26 | Disposition: A | Discharge status: Home / Self Care | Payer: BLUE CROSS/BLUE SHIELD | Source: Ambulatory Visit | Attending: Radiation Oncology | Admitting: Radiation Oncology

## 2018-07-26 DIAGNOSIS — C61 Malignant neoplasm of prostate: Secondary | ICD-10-CM | POA: Diagnosis not present

## 2018-07-30 ENCOUNTER — Ambulatory Visit
Admission: RE | Admit: 2018-07-30 | Discharge: 2018-07-30 | Disposition: A | Discharge status: Home / Self Care | Payer: BLUE CROSS/BLUE SHIELD | Source: Ambulatory Visit | Attending: Radiation Oncology | Admitting: Radiation Oncology

## 2018-07-30 DIAGNOSIS — Z510 Encounter for antineoplastic radiation therapy: Secondary | ICD-10-CM | POA: Diagnosis not present

## 2018-07-30 DIAGNOSIS — C61 Malignant neoplasm of prostate: Secondary | ICD-10-CM | POA: Insufficient documentation

## 2018-07-31 ENCOUNTER — Ambulatory Visit
Admission: RE | Admit: 2018-07-31 | Discharge: 2018-07-31 | Disposition: A | Discharge status: Home / Self Care | Payer: BLUE CROSS/BLUE SHIELD | Source: Ambulatory Visit | Attending: Radiation Oncology | Admitting: Radiation Oncology

## 2018-07-31 DIAGNOSIS — C61 Malignant neoplasm of prostate: Secondary | ICD-10-CM | POA: Diagnosis not present

## 2018-08-01 ENCOUNTER — Ambulatory Visit
Admission: RE | Admit: 2018-08-01 | Discharge: 2018-08-01 | Disposition: A | Discharge status: Home / Self Care | Payer: BLUE CROSS/BLUE SHIELD | Source: Ambulatory Visit | Attending: Radiation Oncology | Admitting: Radiation Oncology

## 2018-08-01 DIAGNOSIS — C61 Malignant neoplasm of prostate: Secondary | ICD-10-CM | POA: Diagnosis not present

## 2018-08-02 ENCOUNTER — Ambulatory Visit
Admission: RE | Admit: 2018-08-02 | Discharge: 2018-08-02 | Disposition: A | Discharge status: Home / Self Care | Payer: BLUE CROSS/BLUE SHIELD | Source: Ambulatory Visit | Attending: Radiation Oncology | Admitting: Radiation Oncology

## 2018-08-02 ENCOUNTER — Encounter: Payer: Self-pay | Admitting: Radiation Oncology

## 2018-08-02 DIAGNOSIS — C61 Malignant neoplasm of prostate: Secondary | ICD-10-CM | POA: Diagnosis not present

## 2018-08-05 ENCOUNTER — Ambulatory Visit
Admission: RE | Admit: 2018-08-05 | Discharge: 2018-08-05 | Disposition: A | Discharge status: Home / Self Care | Payer: BLUE CROSS/BLUE SHIELD | Source: Ambulatory Visit | Attending: Radiation Oncology | Admitting: Radiation Oncology

## 2018-08-05 DIAGNOSIS — C61 Malignant neoplasm of prostate: Secondary | ICD-10-CM | POA: Diagnosis not present

## 2018-08-06 ENCOUNTER — Ambulatory Visit
Admission: RE | Admit: 2018-08-06 | Discharge: 2018-08-06 | Disposition: A | Discharge status: Home / Self Care | Payer: BLUE CROSS/BLUE SHIELD | Source: Ambulatory Visit | Attending: Radiation Oncology | Admitting: Radiation Oncology

## 2018-08-06 DIAGNOSIS — C61 Malignant neoplasm of prostate: Secondary | ICD-10-CM | POA: Diagnosis not present

## 2018-08-07 ENCOUNTER — Ambulatory Visit
Admission: RE | Admit: 2018-08-07 | Discharge: 2018-08-07 | Disposition: A | Discharge status: Home / Self Care | Payer: BLUE CROSS/BLUE SHIELD | Source: Ambulatory Visit | Attending: Radiation Oncology | Admitting: Radiation Oncology

## 2018-08-07 DIAGNOSIS — C61 Malignant neoplasm of prostate: Secondary | ICD-10-CM | POA: Diagnosis not present

## 2018-08-08 ENCOUNTER — Ambulatory Visit
Admission: RE | Admit: 2018-08-08 | Discharge: 2018-08-08 | Disposition: A | Discharge status: Home / Self Care | Payer: BLUE CROSS/BLUE SHIELD | Source: Ambulatory Visit | Attending: Radiation Oncology | Admitting: Radiation Oncology

## 2018-08-08 DIAGNOSIS — C61 Malignant neoplasm of prostate: Secondary | ICD-10-CM | POA: Diagnosis not present

## 2018-08-09 ENCOUNTER — Ambulatory Visit
Admission: RE | Admit: 2018-08-09 | Discharge: 2018-08-09 | Disposition: A | Discharge status: Home / Self Care | Payer: BLUE CROSS/BLUE SHIELD | Source: Ambulatory Visit | Attending: Radiation Oncology | Admitting: Radiation Oncology

## 2018-08-09 DIAGNOSIS — C61 Malignant neoplasm of prostate: Secondary | ICD-10-CM | POA: Diagnosis not present

## 2018-08-12 ENCOUNTER — Ambulatory Visit
Admission: RE | Admit: 2018-08-12 | Discharge: 2018-08-12 | Disposition: A | Discharge status: Home / Self Care | Payer: BLUE CROSS/BLUE SHIELD | Source: Ambulatory Visit | Attending: Radiation Oncology | Admitting: Radiation Oncology

## 2018-08-12 DIAGNOSIS — C61 Malignant neoplasm of prostate: Secondary | ICD-10-CM | POA: Diagnosis not present

## 2018-08-13 ENCOUNTER — Ambulatory Visit
Admission: RE | Admit: 2018-08-13 | Discharge: 2018-08-13 | Disposition: A | Discharge status: Home / Self Care | Payer: BLUE CROSS/BLUE SHIELD | Source: Ambulatory Visit | Attending: Radiation Oncology | Admitting: Radiation Oncology

## 2018-08-13 DIAGNOSIS — C61 Malignant neoplasm of prostate: Secondary | ICD-10-CM | POA: Diagnosis not present

## 2018-08-14 ENCOUNTER — Ambulatory Visit
Admission: RE | Admit: 2018-08-14 | Discharge: 2018-08-14 | Disposition: A | Discharge status: Home / Self Care | Payer: BLUE CROSS/BLUE SHIELD | Source: Ambulatory Visit | Attending: Radiation Oncology | Admitting: Radiation Oncology

## 2018-08-14 DIAGNOSIS — C61 Malignant neoplasm of prostate: Secondary | ICD-10-CM | POA: Diagnosis not present

## 2018-08-15 ENCOUNTER — Ambulatory Visit
Admission: RE | Admit: 2018-08-15 | Discharge: 2018-08-15 | Disposition: A | Discharge status: Home / Self Care | Payer: BLUE CROSS/BLUE SHIELD | Source: Ambulatory Visit | Attending: Radiation Oncology | Admitting: Radiation Oncology

## 2018-08-15 DIAGNOSIS — C61 Malignant neoplasm of prostate: Secondary | ICD-10-CM | POA: Diagnosis not present

## 2018-08-16 ENCOUNTER — Ambulatory Visit
Admission: RE | Admit: 2018-08-16 | Discharge: 2018-08-16 | Disposition: A | Discharge status: Home / Self Care | Payer: BLUE CROSS/BLUE SHIELD | Source: Ambulatory Visit | Attending: Radiation Oncology | Admitting: Radiation Oncology

## 2018-08-16 ENCOUNTER — Encounter: Payer: Self-pay | Admitting: Medical Oncology

## 2018-08-16 ENCOUNTER — Encounter: Payer: Self-pay | Admitting: Radiation Oncology

## 2018-08-16 DIAGNOSIS — C61 Malignant neoplasm of prostate: Secondary | ICD-10-CM | POA: Diagnosis not present

## 2018-08-16 NOTE — Progress Notes (Signed)
  Radiation Oncology         [phone] ________________________________  Name: [name] MRN: [id number]  Date: 08/16/2018  DOB: [birth year]  [name] has completed treatment and is released to return to work at this time without restriction   Rodman Key A. Tammi Klippel, M.D.

## 2018-08-19 ENCOUNTER — Ambulatory Visit: Payer: BLUE CROSS/BLUE SHIELD

## 2018-08-20 ENCOUNTER — Ambulatory Visit: Payer: BLUE CROSS/BLUE SHIELD

## 2018-08-21 ENCOUNTER — Ambulatory Visit: Payer: BLUE CROSS/BLUE SHIELD

## 2018-08-22 ENCOUNTER — Ambulatory Visit: Payer: BLUE CROSS/BLUE SHIELD

## 2018-08-23 ENCOUNTER — Ambulatory Visit: Payer: BLUE CROSS/BLUE SHIELD

## 2018-08-26 ENCOUNTER — Encounter: Payer: Self-pay | Admitting: Radiation Oncology

## 2018-08-26 ENCOUNTER — Ambulatory Visit: Payer: BLUE CROSS/BLUE SHIELD

## 2018-08-26 NOTE — Progress Notes (Signed)
  Radiation Oncology         [phone] ________________________________  Name: [name] MRN: [id number]  Date: 08/26/2018  DOB: 1946-09-17  End of Treatment Note  Diagnosis:   72 y.o. gentleman with Stage T1c adenocarcinoma of the prostate with Gleason Score of 3+4, and PSA of 9.45     Indication for treatment:  Curative, Definitive Radiotherapy       Radiation treatment dates:   07/09/18 - 08/16/18  Site/dose:   The prostate was treated to 70 Gy in 28 fractions of 2.5 Gy  Beams/energy:   The patient was treated with IMRT using volumetric arc therapy delivering 6 MV X-rays to clockwise and counterclockwise circumferential arcs with a 90 degree collimator offset to avoid dose scalloping.  Image guidance was performed with daily cone beam CT prior to each fraction to align to gold markers in the prostate and assure proper bladder and rectal fill volumes.  Immobilization was achieved with BodyFix custom mold.  Narrative: The patient tolerated radiation treatment relatively well.  He denied pain, hematuria, or fatigue.  He did experience occasional post void leakage and urgency throughout most of his treatment.  He experienced signfiicant increase in LUTS towards the beginning of treatment and was seen by [facility] for suspected AUR.  Fortunately, he was not in retention and was afforded relief of LUTS after 2-3 weeks with Uribel, Flomax, and Ditropan.  He had resolution of his dysuria with Tylenol and Pyridium.  He also experienced constipation which was relieved with use of a stool softener.   Plan: The patient has completed radiation treatment. He will return to radiation oncology clinic for routine followup in one month. I advised him to call or return sooner if he has any questions or concerns related to his recovery or treatment. ________________________________  Sheral Apley. Tammi Klippel, M.D.  This document serves as a record of services personally performed by Tyler Pita, MD.  It was created on his behalf by Wilburn Mylar, a trained medical scribe. The creation of this record is based on the scribe's personal observations and the provider's statements to them. This document has been checked and approved by the attending provider.

## 2018-08-27 ENCOUNTER — Ambulatory Visit: Payer: BLUE CROSS/BLUE SHIELD

## 2018-08-28 ENCOUNTER — Ambulatory Visit: Payer: BLUE CROSS/BLUE SHIELD

## 2018-08-29 ENCOUNTER — Ambulatory Visit: Payer: BLUE CROSS/BLUE SHIELD

## 2018-08-30 ENCOUNTER — Ambulatory Visit: Payer: BLUE CROSS/BLUE SHIELD

## 2018-09-02 ENCOUNTER — Ambulatory Visit: Payer: BLUE CROSS/BLUE SHIELD

## 2018-09-03 ENCOUNTER — Ambulatory Visit: Payer: BLUE CROSS/BLUE SHIELD

## 2018-09-10 ENCOUNTER — Ambulatory Visit
Admission: RE | Admit: 2018-09-10 | Discharge: 2018-09-10 | Disposition: A | Discharge status: Home / Self Care | Payer: BLUE CROSS/BLUE SHIELD | Source: Ambulatory Visit | Attending: Urology | Admitting: Urology

## 2018-09-10 ENCOUNTER — Other Ambulatory Visit: Payer: Self-pay

## 2018-09-10 ENCOUNTER — Encounter: Payer: Self-pay | Admitting: Urology

## 2018-09-10 VITALS — BP 98/69 | HR 75 | Temp 98.3°F | Resp 18 | Wt 224.4 lb

## 2018-09-10 DIAGNOSIS — Z923 Personal history of irradiation: Secondary | ICD-10-CM | POA: Insufficient documentation

## 2018-09-10 DIAGNOSIS — Z79899 Other long term (current) drug therapy: Secondary | ICD-10-CM | POA: Insufficient documentation

## 2018-09-10 DIAGNOSIS — C61 Malignant neoplasm of prostate: Secondary | ICD-10-CM | POA: Diagnosis present

## 2018-09-10 NOTE — Progress Notes (Signed)
Radiation Oncology         [phone] ________________________________  Name: [name] MRN: [id number]  Date: 09/10/2018  DOB: 12/25/1945  Post Treatment Note  CC: Antony Contras, MD  Davis Gourd*  Diagnosis:   72 y.o. gentleman with Stage T1c adenocarcinoma of the prostate with Gleason Score of 3+4, and PSA of 9.45   Interval Since Last Radiation:  3.5 weeks  07/09/18 - 08/16/18: The prostate was treated to 70 Gy in 28 fractions of 2.5 Gy  Narrative:  The patient returns today for routine follow-up. He tolerated radiation treatment relatively well.  He denied pain, hematuria, or fatigue.  He did experience occasional post void leakage and urgency throughout most of his treatment.  He experienced signfiicant increase in LUTS towards the beginning of treatment and was seen by [facility] for suspected AUR.  Fortunately, he was not in retention and was afforded relief of LUTS after 2-3 weeks with Uribel, Flomax, and Ditropan.  He had resolution of his dysuria with Tylenol and Pyridium.  He also experienced constipation which was relieved with use of a stool softener.                        On review of systems, the patient states that he is doing very well overall.  He continues with increased daytime frequency, urgency and occasional feelings of incomplete emptying but overall feels that he empties his bladder well on voiding and reports a good force of stream.  He has nocturia x2.  He specifically denies gross hematuria, dysuria, straining to void, intermittency or incontinence.  He feels that his lower urinary tract symptoms are gradually improving and denies any significant impact on his energy level.  He reports a healthy appetite and is maintaining his weight.  He denies abdominal pain, nausea, vomiting, diarrhea or constipation.  ALLERGIES:  is allergic to bee venom and penicillins.  Meds: Current Outpatient Medications  Medication Sig Dispense Refill  . amLODipine  (NORVASC) 5 MG tablet Take 5 mg by mouth daily.  3  . atorvastatin (LIPITOR) 20 MG tablet Take 20 mg by mouth every morning.     Marland Kitchen EPINEPHrine 0.3 mg/0.3 mL IJ SOAJ injection epinephrine 0.3 mg/0.3 mL injection, auto-injector    . lisinopril-hydrochlorothiazide (PRINZIDE,ZESTORETIC) 20-25 MG per tablet Take 1 tablet by mouth every morning.   5  . Meth-Hyo-M Bl-Na Phos-Ph Sal (URIBEL) 118 MG CAPS Take by mouth.    Marland Kitchen MYRBETRIQ 50 MG TB24 tablet Take 50 mg by mouth daily.  11  . oxybutynin (DITROPAN-XL) 10 MG 24 hr tablet Take 10 mg by mouth every morning.     . tamsulosin (FLOMAX) 0.4 MG CAPS capsule Take 0.4 mg by mouth daily after breakfast.     . HYDROcodone-acetaminophen (NORCO/VICODIN) 5-325 MG tablet Take 1 tablet by mouth every 6 (six) hours as needed for moderate pain.    . traMADol (ULTRAM) 50 MG tablet tramadol 50 mg tablet  TAKE 1 TABLET BY MOUTH EVERY 6 HOURS AS NEEDED     No current facility-administered medications for this encounter.     Physical Findings:  weight is 224 lb 6.4 oz (101.8 kg). His oral temperature is 98.3 F (36.8 C). His blood pressure is 98/69 and his pulse is 75. His respiration is 18 and oxygen saturation is 98%.  Pain Assessment Pain Score: 0-No pain/10 In general this is a well appearing African-American male in no acute distress.  He's alert and  oriented x4 and appropriate throughout the examination. Cardiopulmonary assessment is negative for acute distress and he exhibits normal effort.   Lab Findings: Lab Results  Component Value Date   WBC 6.8 08/17/2016   HGB 15.0 06/26/2018   HCT 44.0 06/26/2018   MCV 86.4 08/17/2016   PLT 185 08/17/2016     Radiographic Findings: No results found.  Impression/Plan: 1. 72 y.o. gentleman with Stage T1c adenocarcinoma of the prostate with Gleason Score of 3+4, and PSA of 9.45.   He will continue to follow up with urology for ongoing PSA determinations and has an appointment scheduled with Dr. Lovena Neighbours on  10/10/2018. He understands what to expect with regards to PSA monitoring going forward. I will look forward to following his response to treatment via correspondence with urology, and would be happy to continue to participate in his care if clinically indicated. I talked to the patient about what to expect in the future, including his risk for erectile dysfunction and rectal bleeding. I encouraged him to call or return to the office if he has any questions regarding his previous radiation or possible radiation side effects. He was comfortable with this plan and will follow up as needed.    Nicholos Johns, PA-C

## 2018-09-25 ENCOUNTER — Encounter: Payer: Self-pay | Admitting: Gastroenterology

## 2018-10-09 ENCOUNTER — Encounter: Payer: Self-pay | Admitting: Gastroenterology

## 2018-11-05 ENCOUNTER — Ambulatory Visit (AMBULATORY_SURGERY_CENTER): Payer: Self-pay

## 2018-11-05 ENCOUNTER — Encounter: Payer: Self-pay | Admitting: Gastroenterology

## 2018-11-05 VITALS — Ht 75.0 in | Wt 222.5 lb

## 2018-11-05 DIAGNOSIS — Z86010 Personal history of colonic polyps: Secondary | ICD-10-CM

## 2018-11-05 MED ORDER — PEG 3350-KCL-NA BICARB-NACL 420 G PO SOLR: 4000.0000 mL | Freq: Once | ORAL | 0 refills | Status: AC

## 2018-11-05 NOTE — Progress Notes (Signed)
Denies allergies to eggs or soy products. Denies complication of anesthesia or sedation. Denies use of weight loss medication. Denies use of O2.   Emmi instructions declined.  

## 2018-11-18 ENCOUNTER — Encounter: Payer: Self-pay | Admitting: Gastroenterology

## 2018-11-18 ENCOUNTER — Ambulatory Visit (AMBULATORY_SURGERY_CENTER): Payer: BLUE CROSS/BLUE SHIELD | Admitting: Gastroenterology

## 2018-11-18 VITALS — BP 107/71 | HR 64 | Temp 98.6°F | Resp 17 | Ht 75.0 in | Wt 222.0 lb

## 2018-11-18 DIAGNOSIS — D125 Benign neoplasm of sigmoid colon: Secondary | ICD-10-CM | POA: Diagnosis not present

## 2018-11-18 DIAGNOSIS — K5730 Diverticulosis of large intestine without perforation or abscess without bleeding: Secondary | ICD-10-CM

## 2018-11-18 DIAGNOSIS — D123 Benign neoplasm of transverse colon: Secondary | ICD-10-CM | POA: Diagnosis not present

## 2018-11-18 DIAGNOSIS — Z86010 Personal history of colonic polyps: Secondary | ICD-10-CM | POA: Diagnosis not present

## 2018-11-18 MED ORDER — SODIUM CHLORIDE 0.9 % IV SOLN: 500.0000 mL | Freq: Once | INTRAVENOUS | Status: DC

## 2018-11-18 NOTE — Progress Notes (Signed)
Called to room to assist during endoscopic procedure.  Patient ID and intended procedure confirmed with present staff. Received instructions for my participation in the procedure from the performing physician.  

## 2018-11-18 NOTE — Progress Notes (Signed)
Report given to PACU, vss 

## 2018-11-18 NOTE — Op Note (Signed)
[facility] Patient Name: [name] Procedure Date: 11/18/2018 9:23 AM MRN: [id number] Endoscopist: Milus Banister , MD Age: 72 Referring MD:  Date of Birth: [birth year] Gender: Male Account #: [id number] Procedure:                Colonoscopy Indications:              High risk colon cancer surveillance: Personal                            history of colonic polyps, including Colonoscopy                            2014 single subCM adenoma removed. Medicines:                Monitored Anesthesia Care Procedure:                Pre-Anesthesia Assessment:                           - Prior to the procedure, a History and Physical                            was performed, and patient medications and                            allergies were reviewed. The patient's tolerance of                            previous anesthesia was also reviewed. The risks                            and benefits of the procedure and the sedation                            options and risks were discussed with the patient.                            All questions were answered, and informed consent                            was obtained. Prior Anticoagulants: The patient has                            taken no previous anticoagulant or antiplatelet                            agents. ASA Grade Assessment: II - A patient with                            mild systemic disease. After reviewing the risks                            and benefits, the patient was deemed in  satisfactory condition to undergo the procedure.                           After obtaining informed consent, the colonoscope                            was passed under direct vision. Throughout the                            procedure, the patient's blood pressure, pulse, and                            oxygen saturations were monitored continuously. The                            Model PCF-[id number])  scope was introduced                            through the anus and advanced to the the cecum,                            identified by appendiceal orifice and ileocecal                            valve. The colonoscopy was performed without                            difficulty. The patient tolerated the procedure                            well. The quality of the bowel preparation was                            good. The ileocecal valve, appendiceal orifice, and                            rectum were photographed. Scope In: 9:27:48 AM Scope Out: 9:44:42 AM Scope Withdrawal Time: 0 hours 13 minutes 13 seconds  Total Procedure Duration: 0 hours 16 minutes 54 seconds  Findings:                 Two sessile polyps were found in the transverse                            colon. The polyps were 2 to 3 mm in size. These                            polyps were removed with a cold snare. Resection                            and retrieval were complete.                           A 14 mm polyp was found in the sigmoid colon.  The                            polyp was pedunculated. The polyp was removed with                            a hot snare. Resection and retrieval were complete.                           Small and large-mouthed diverticula were found in                            the left colon.                           The exam was otherwise without abnormality on                            direct and retroflexion views. Complications:            No immediate complications. Estimated Blood Loss:     Estimated blood loss: none. Impression:               - Two 2 to 3 mm polyps in the transverse colon,                            removed with a cold snare. Resected and retrieved.                           - One 14 mm polyp in the sigmoid colon, removed                            with a hot snare. Resected and retrieved.                           - Diverticulosis in the left colon.                            - The examination was otherwise normal on direct                            and retroflexion views. Recommendation:           - Patient has a contact number available for                            emergencies. The signs and symptoms of potential                            delayed complications were discussed with the                            patient. Return to normal activities tomorrow.                            Written discharge  instructions were provided to the                            patient.                           - Resume previous diet.                           - Continue present medications.                           You will receive a letter within 2-3 weeks with the                            pathology results and my final recommendations.                           If the polyp(s) is proven to be 'pre-cancerous' on                            pathology, you will need repeat colonoscopy in 3                            years. If the polyp(s) is NOT 'precancerous' on                            pathology then you should repeat colon cancer                            screening in 10 years with colonoscopy without need                            for colon cancer screening by any method prior to                            then (including stool testing). Milus Banister, MD 11/18/2018 9:49:07 AM This report has been signed electronically.

## 2018-11-18 NOTE — Patient Instructions (Signed)
Discharge instructions given. Handouts on polyps and diverticulosis. Resume previous medications. YOU HAD AN ENDOSCOPIC PROCEDURE TODAY AT THE [facility]:   Refer to the procedure report that was given to you for any specific questions about what was found during the examination.  If the procedure report does not answer your questions, please call your gastroenterologist to clarify.  If you requested that your care partner not be given the details of your procedure findings, then the procedure report has been included in a sealed envelope for you to review at your convenience later.  YOU SHOULD EXPECT: Some feelings of bloating in the abdomen. Passage of more gas than usual.  Walking can help get rid of the air that was put into your GI tract during the procedure and reduce the bloating. If you had a lower endoscopy (such as a colonoscopy or flexible sigmoidoscopy) you may notice spotting of blood in your stool or on the toilet paper. If you underwent a bowel prep for your procedure, you may not have a normal bowel movement for a few days.  Please Note:  You might notice some irritation and congestion in your nose or some drainage.  This is from the oxygen used during your procedure.  There is no need for concern and it should clear up in a day or so.  SYMPTOMS TO REPORT IMMEDIATELY:   Following lower endoscopy (colonoscopy or flexible sigmoidoscopy):  Excessive amounts of blood in the stool  Significant tenderness or worsening of abdominal pains  Swelling of the abdomen that is new, acute  Fever of 100F or higher   For urgent or emergent issues, a gastroenterologist can be reached at any hour by calling [phone].   DIET:  We do recommend a small meal at first, but then you may proceed to your regular diet.  Drink plenty of fluids but you should avoid alcoholic beverages for 24 hours.  ACTIVITY:  You should plan to take it easy for the rest of today and you should NOT  DRIVE or use heavy machinery until tomorrow (because of the sedation medicines used during the test).    FOLLOW UP: Our staff will call the number listed on your records the next business day following your procedure to check on you and address any questions or concerns that you may have regarding the information given to you following your procedure. If we do not reach you, we will leave a message.  However, if you are feeling well and you are not experiencing any problems, there is no need to return our call.  We will assume that you have returned to your regular daily activities without incident.  If any biopsies were taken you will be contacted by phone or by letter within the next 1-3 weeks.  Please call us at [phone] if you have not heard about the biopsies in 3 weeks.    SIGNATURES/CONFIDENTIALITY: You and/or your care partner have signed paperwork which will be entered into your electronic medical record.  These signatures attest to the fact that that the information above on your After Visit Summary has been reviewed and is understood.  Full responsibility of the confidentiality of this discharge information lies with you and/or your care-partner. 

## 2018-11-18 NOTE — Progress Notes (Signed)
Pt. Reports no change in his medical or surgical history since his pre-visit 11/05/2018.

## 2018-11-19 ENCOUNTER — Telehealth: Payer: Self-pay

## 2018-11-19 NOTE — Telephone Encounter (Signed)
  Follow up Call-  Call back number 11/18/2018  Post procedure Call Back phone  # [phone]  Permission to leave phone message Yes  Some recent data might be hidden     Patient questions:  Do you have a fever, pain , or abdominal swelling? No. Pain Score  0 *  Have you tolerated food without any problems? Yes.    Have you been able to return to your normal activities? Yes.    Do you have any questions about your discharge instructions: Diet   No. Medications  No. Follow up visit  No.  Do you have questions or concerns about your Care? No.  Actions: * If pain score is 4 or above: No action needed, pain <4.

## 2018-11-22 ENCOUNTER — Encounter: Payer: Self-pay | Admitting: Gastroenterology

## 2018-12-24 ENCOUNTER — Telehealth: Payer: Self-pay | Admitting: Radiation Oncology

## 2018-12-24 NOTE — Telephone Encounter (Signed)
Patient came by the [place] office to drop of his [employer] Cancer Form for reimbursement to be completed. The form was submitted to nursing to complete. I advised patient once the form is complete, the form will be faxed to [employer] along with the itemized statement(s) from his radiation treatment received in 2019. Patient acknowledged and verbally agreed.

## 2018-12-26 ENCOUNTER — Telehealth: Payer: Self-pay | Admitting: *Deleted

## 2018-12-26 NOTE — Telephone Encounter (Signed)
Received [employer] request for Cancer Claim.  Completed to the best of our ability.  Not able to include itemized statements or bills and returned completed to [phone] with note advising bills and statements would need to be provided by the patient.  Sent to Scanning department for proof of completion.

## 2019-04-03 DIAGNOSIS — C61 Malignant neoplasm of prostate: Secondary | ICD-10-CM | POA: Diagnosis not present

## 2019-04-10 DIAGNOSIS — R3915 Urgency of urination: Secondary | ICD-10-CM | POA: Diagnosis not present

## 2019-04-10 DIAGNOSIS — N401 Enlarged prostate with lower urinary tract symptoms: Secondary | ICD-10-CM | POA: Diagnosis not present

## 2019-04-10 DIAGNOSIS — C61 Malignant neoplasm of prostate: Secondary | ICD-10-CM | POA: Diagnosis not present

## 2019-04-10 DIAGNOSIS — R310 Gross hematuria: Secondary | ICD-10-CM | POA: Diagnosis not present

## 2019-05-12 DIAGNOSIS — C61 Malignant neoplasm of prostate: Secondary | ICD-10-CM | POA: Diagnosis not present

## 2019-05-12 DIAGNOSIS — R310 Gross hematuria: Secondary | ICD-10-CM | POA: Diagnosis not present

## 2019-07-07 ENCOUNTER — Encounter: Payer: Self-pay | Admitting: Genetic Counselor

## 2019-07-07 NOTE — Progress Notes (Signed)
UPDATE: ATM [id number] VUS was amended to "Likely Benign" due to a re-review of the evidence in light of new variant interpretation guidelines and/or new information.

## 2019-07-17 ENCOUNTER — Encounter: Payer: Self-pay | Admitting: Genetic Counselor

## 2019-08-12 ENCOUNTER — Encounter: Payer: Self-pay | Admitting: *Deleted

## 2019-10-18 DIAGNOSIS — R351 Nocturia: Secondary | ICD-10-CM | POA: Diagnosis not present

## 2019-10-18 DIAGNOSIS — Z8546 Personal history of malignant neoplasm of prostate: Secondary | ICD-10-CM | POA: Diagnosis not present

## 2019-10-18 DIAGNOSIS — F17210 Nicotine dependence, cigarettes, uncomplicated: Secondary | ICD-10-CM | POA: Diagnosis not present

## 2019-10-18 DIAGNOSIS — R109 Unspecified abdominal pain: Secondary | ICD-10-CM | POA: Diagnosis not present

## 2019-10-18 DIAGNOSIS — R7301 Impaired fasting glucose: Secondary | ICD-10-CM | POA: Diagnosis not present

## 2019-10-27 DIAGNOSIS — R7301 Impaired fasting glucose: Secondary | ICD-10-CM | POA: Diagnosis not present

## 2019-10-27 DIAGNOSIS — I10 Essential (primary) hypertension: Secondary | ICD-10-CM | POA: Diagnosis not present

## 2019-10-27 DIAGNOSIS — R109 Unspecified abdominal pain: Secondary | ICD-10-CM | POA: Diagnosis not present

## 2019-10-27 DIAGNOSIS — E7800 Pure hypercholesterolemia, unspecified: Secondary | ICD-10-CM | POA: Diagnosis not present

## 2019-11-03 DIAGNOSIS — Z0000 Encounter for general adult medical examination without abnormal findings: Secondary | ICD-10-CM | POA: Diagnosis not present

## 2019-11-03 DIAGNOSIS — E1169 Type 2 diabetes mellitus with other specified complication: Secondary | ICD-10-CM | POA: Diagnosis not present

## 2019-11-03 DIAGNOSIS — Z1211 Encounter for screening for malignant neoplasm of colon: Secondary | ICD-10-CM | POA: Diagnosis not present

## 2019-11-03 DIAGNOSIS — Z1389 Encounter for screening for other disorder: Secondary | ICD-10-CM | POA: Diagnosis not present

## 2019-11-03 DIAGNOSIS — C61 Malignant neoplasm of prostate: Secondary | ICD-10-CM | POA: Diagnosis not present

## 2019-11-03 DIAGNOSIS — E782 Mixed hyperlipidemia: Secondary | ICD-10-CM | POA: Diagnosis not present

## 2019-11-03 DIAGNOSIS — E291 Testicular hypofunction: Secondary | ICD-10-CM | POA: Diagnosis not present

## 2019-11-03 DIAGNOSIS — I10 Essential (primary) hypertension: Secondary | ICD-10-CM | POA: Diagnosis not present

## 2019-11-03 DIAGNOSIS — Z91030 Bee allergy status: Secondary | ICD-10-CM | POA: Diagnosis not present

## 2019-11-03 DIAGNOSIS — M25562 Pain in left knee: Secondary | ICD-10-CM | POA: Diagnosis not present

## 2019-11-03 DIAGNOSIS — N3281 Overactive bladder: Secondary | ICD-10-CM | POA: Diagnosis not present

## 2019-11-04 DIAGNOSIS — M25561 Pain in right knee: Secondary | ICD-10-CM | POA: Diagnosis not present

## 2019-11-04 DIAGNOSIS — M25562 Pain in left knee: Secondary | ICD-10-CM | POA: Diagnosis not present

## 2019-12-18 ENCOUNTER — Ambulatory Visit: Payer: Medicare Other | Attending: Internal Medicine

## 2019-12-18 DIAGNOSIS — Z23 Encounter for immunization: Secondary | ICD-10-CM

## 2019-12-18 NOTE — Progress Notes (Signed)
   Covid-19 Vaccination Clinic  Name:  VERYL AHLSTRAND    MRN: [id number] DOB: 01-23-1946  12/18/2019  Mr. Pugsley was observed post Covid-19 immunization for 30 minutes based on pre-vaccination screening without incidence. He was provided with Vaccine Information Sheet and instruction to access the V-Safe system.   Mr. Done was instructed to call 911 with any severe reactions post vaccine: Marland Kitchen Difficulty breathing  . Swelling of your face and throat  . A fast heartbeat  . A bad rash all over your body  . Dizziness and weakness    Immunizations Administered    Name Date Dose VIS Date Route   Pfizer COVID-19 Vaccine 12/18/2019  3:02 PM 0.3 mL 11/07/2019 Intramuscular   Manufacturer: [place]   Lot: [id number]   [place]: [id number]

## 2020-01-08 ENCOUNTER — Ambulatory Visit: Payer: Medicare Other | Attending: Internal Medicine

## 2020-01-08 DIAGNOSIS — Z23 Encounter for immunization: Secondary | ICD-10-CM

## 2020-01-08 NOTE — Progress Notes (Signed)
   Covid-19 Vaccination Clinic  Name:  BLAN SHOCKLEY    MRN: [id number] DOB: 1946-03-09  01/08/2020  Mr. Edling was observed post Covid-19 immunization for 30 minutes based on pre-vaccination screening without incidence. He was provided with Vaccine Information Sheet and instruction to access the V-Safe system.   Mr. Brackens was instructed to call 911 with any severe reactions post vaccine: Marland Kitchen Difficulty breathing  . Swelling of your face and throat  . A fast heartbeat  . A bad rash all over your body  . Dizziness and weakness    Immunizations Administered    Name Date Dose VIS Date Route   Pfizer COVID-19 Vaccine 01/08/2020  4:38 PM 0.3 mL 11/07/2019 Intramuscular   Manufacturer: [place]   Lot: [id number]   [place]: [id number]

## 2020-04-15 ENCOUNTER — Other Ambulatory Visit: Payer: Self-pay

## 2020-04-15 ENCOUNTER — Observation Stay (HOSPITAL_COMMUNITY)
Admission: EM | Admit: 2020-04-15 | Discharge: 2020-04-16 | Disposition: A | Discharge status: Home / Self Care | Payer: Medicare Other | Attending: Urology | Admitting: Urology

## 2020-04-15 ENCOUNTER — Encounter (HOSPITAL_COMMUNITY): Payer: Self-pay

## 2020-04-15 ENCOUNTER — Encounter (HOSPITAL_COMMUNITY)
Admission: EM | Disposition: A | Discharge status: Home / Self Care | Payer: Self-pay | Source: Home / Self Care | Attending: Emergency Medicine

## 2020-04-15 DIAGNOSIS — Z880 Allergy status to penicillin: Secondary | ICD-10-CM | POA: Insufficient documentation

## 2020-04-15 DIAGNOSIS — R339 Retention of urine, unspecified: Secondary | ICD-10-CM

## 2020-04-15 DIAGNOSIS — Z8249 Family history of ischemic heart disease and other diseases of the circulatory system: Secondary | ICD-10-CM | POA: Diagnosis not present

## 2020-04-15 DIAGNOSIS — Z923 Personal history of irradiation: Secondary | ICD-10-CM | POA: Insufficient documentation

## 2020-04-15 DIAGNOSIS — Z20822 Contact with and (suspected) exposure to covid-19: Secondary | ICD-10-CM | POA: Insufficient documentation

## 2020-04-15 DIAGNOSIS — Z8546 Personal history of malignant neoplasm of prostate: Secondary | ICD-10-CM | POA: Diagnosis not present

## 2020-04-15 DIAGNOSIS — M1990 Unspecified osteoarthritis, unspecified site: Secondary | ICD-10-CM | POA: Insufficient documentation

## 2020-04-15 DIAGNOSIS — E785 Hyperlipidemia, unspecified: Secondary | ICD-10-CM | POA: Insufficient documentation

## 2020-04-15 DIAGNOSIS — R338 Other retention of urine: Secondary | ICD-10-CM | POA: Diagnosis not present

## 2020-04-15 DIAGNOSIS — R310 Gross hematuria: Secondary | ICD-10-CM

## 2020-04-15 DIAGNOSIS — I10 Essential (primary) hypertension: Secondary | ICD-10-CM | POA: Diagnosis not present

## 2020-04-15 DIAGNOSIS — Z888 Allergy status to other drugs, medicaments and biological substances status: Secondary | ICD-10-CM | POA: Diagnosis not present

## 2020-04-15 DIAGNOSIS — R319 Hematuria, unspecified: Secondary | ICD-10-CM | POA: Diagnosis present

## 2020-04-15 DIAGNOSIS — G4733 Obstructive sleep apnea (adult) (pediatric): Secondary | ICD-10-CM | POA: Diagnosis not present

## 2020-04-15 DIAGNOSIS — N401 Enlarged prostate with lower urinary tract symptoms: Secondary | ICD-10-CM | POA: Diagnosis not present

## 2020-04-15 LAB — CBC WITH DIFFERENTIAL/PLATELET
Abs Immature Granulocytes: 0.02 10*3/uL (ref 0.00–0.07)
Basophils Absolute: 0 10*3/uL (ref 0.0–0.1)
Basophils Relative: 0 %
Eosinophils Absolute: 0.1 10*3/uL (ref 0.0–0.5)
Eosinophils Relative: 1 %
HCT: 39.3 % (ref 39.0–52.0)
Hemoglobin: 12.5 g/dL — ABNORMAL LOW (ref 13.0–17.0)
Immature Granulocytes: 0 %
Lymphocytes Relative: 16 %
Lymphs Abs: 1.2 10*3/uL (ref 0.7–4.0)
MCH: 27.2 pg (ref 26.0–34.0)
MCHC: 31.8 g/dL (ref 30.0–36.0)
MCV: 85.4 fL (ref 80.0–100.0)
Monocytes Absolute: 0.5 10*3/uL (ref 0.1–1.0)
Monocytes Relative: 6 %
Neutro Abs: 5.6 10*3/uL (ref 1.7–7.7)
Neutrophils Relative %: 77 %
Platelets: 214 10*3/uL (ref 150–400)
RBC: 4.6 MIL/uL (ref 4.22–5.81)
RDW: 14.9 % (ref 11.5–15.5)
WBC: 7.3 10*3/uL (ref 4.0–10.5)
nRBC: 0 % (ref 0.0–0.2)

## 2020-04-15 LAB — URINALYSIS, ROUTINE W REFLEX MICROSCOPIC: RBC / HPF: >50 RBC/hpf — ABNORMAL HIGH (ref 0–5)

## 2020-04-15 LAB — BASIC METABOLIC PANEL
Anion gap: 9 (ref 5–15)
BUN: 16 mg/dL (ref 8–23)
CO2: 26 mmol/L (ref 22–32)
Calcium: 9.3 mg/dL (ref 8.9–10.3)
Chloride: 101 mmol/L (ref 98–111)
Creatinine, Ser: 0.93 mg/dL (ref 0.61–1.24)
GFR calc Af Amer: >60 mL/min (ref >60)
GFR calc non Af Amer: >60 mL/min (ref >60)
Glucose, Bld: 141 mg/dL — ABNORMAL HIGH (ref 70–99)
Potassium: 3.4 mmol/L — ABNORMAL LOW (ref 3.5–5.1)
Sodium: 136 mmol/L (ref 135–145)

## 2020-04-15 LAB — SARS CORONAVIRUS 2 BY RT PCR (HOSPITAL ORDER, PERFORMED IN ~~LOC~~ HOSPITAL LAB): SARS Coronavirus 2: NEGATIVE

## 2020-04-15 LAB — PROTIME-INR
INR: 1.1 (ref 0.8–1.2)
Prothrombin Time: 13.6 seconds (ref 11.4–15.2)

## 2020-04-15 SURGERY — CYSTOSCOPY, WITH BLADDER FULGURATION
Anesthesia: Choice

## 2020-04-15 MED ORDER — HYDROCHLOROTHIAZIDE 25 MG PO TABS
25.0000 mg | ORAL_TABLET | Freq: Every day | ORAL | Status: DC
  Administered 2020-04-15 – 2020-04-16 (×2): 25 mg via ORAL
  Filled 2020-04-15 (×2): qty 1

## 2020-04-15 MED ORDER — SULFAMETHOXAZOLE-TRIMETHOPRIM 800-160 MG PO TABS
1.0000 | ORAL_TABLET | Freq: Two times a day (BID) | ORAL | Status: DC
  Administered 2020-04-15 – 2020-04-16 (×3): 1 via ORAL
  Filled 2020-04-15 (×3): qty 1

## 2020-04-15 MED ORDER — ONDANSETRON HCL 4 MG/2ML IJ SOLN
4.0000 mg | Freq: Once | INTRAMUSCULAR | Status: AC
  Administered 2020-04-15: 4 mg via INTRAVENOUS
  Filled 2020-04-15: qty 2

## 2020-04-15 MED ORDER — LISINOPRIL 20 MG PO TABS
20.0000 mg | ORAL_TABLET | Freq: Every day | ORAL | Status: DC
  Administered 2020-04-15 – 2020-04-16 (×2): 20 mg via ORAL
  Filled 2020-04-15 (×2): qty 1

## 2020-04-15 MED ORDER — TAMSULOSIN HCL 0.4 MG PO CAPS
0.4000 mg | ORAL_CAPSULE | Freq: Every day | ORAL | Status: DC
  Administered 2020-04-16: 0.4 mg via ORAL
  Filled 2020-04-15 (×2): qty 1

## 2020-04-15 MED ORDER — SODIUM CHLORIDE 0.45 % IV SOLN: INTRAVENOUS | Status: DC

## 2020-04-15 MED ORDER — LISINOPRIL-HYDROCHLOROTHIAZIDE 20-25 MG PO TABS: 1.0000 | ORAL_TABLET | Freq: Every morning | ORAL | Status: DC

## 2020-04-15 MED ORDER — ONDANSETRON HCL 4 MG/2ML IJ SOLN: 4.0000 mg | INTRAMUSCULAR | Status: DC | PRN

## 2020-04-15 MED ORDER — SODIUM CHLORIDE 0.9 % IR SOLN: 3000.0000 mL | Status: DC

## 2020-04-15 MED ORDER — FINASTERIDE 5 MG PO TABS
5.0000 mg | ORAL_TABLET | Freq: Every day | ORAL | Status: DC
  Administered 2020-04-15 – 2020-04-16 (×2): 5 mg via ORAL
  Filled 2020-04-15 (×2): qty 1

## 2020-04-15 MED ORDER — FENTANYL CITRATE (PF) 100 MCG/2ML IJ SOLN
50.0000 ug | Freq: Once | INTRAMUSCULAR | Status: AC
  Administered 2020-04-15: 50 ug via INTRAVENOUS
  Filled 2020-04-15: qty 2

## 2020-04-15 MED ORDER — ATORVASTATIN CALCIUM 20 MG PO TABS
20.0000 mg | ORAL_TABLET | Freq: Every morning | ORAL | Status: DC
  Administered 2020-04-15 – 2020-04-16 (×2): 20 mg via ORAL
  Filled 2020-04-15 (×2): qty 1

## 2020-04-15 MED ORDER — ZOLPIDEM TARTRATE 5 MG PO TABS: 5.0000 mg | ORAL_TABLET | Freq: Every evening | ORAL | Status: DC | PRN

## 2020-04-15 MED ORDER — AMLODIPINE BESYLATE 5 MG PO TABS
5.0000 mg | ORAL_TABLET | Freq: Every day | ORAL | Status: DC
  Administered 2020-04-15 – 2020-04-16 (×2): 5 mg via ORAL
  Filled 2020-04-15 (×2): qty 1

## 2020-04-15 MED ORDER — BELLADONNA ALKALOIDS-OPIUM 16.2-60 MG RE SUPP
1.0000 | Freq: Four times a day (QID) | RECTAL | Status: DC | PRN
  Administered 2020-04-15 – 2020-04-16 (×3): 1 via RECTAL
  Filled 2020-04-15 (×3): qty 1

## 2020-04-15 MED ORDER — SENNA 8.6 MG PO TABS
1.0000 | ORAL_TABLET | Freq: Two times a day (BID) | ORAL | Status: DC
  Administered 2020-04-15 – 2020-04-16 (×2): 8.6 mg via ORAL
  Filled 2020-04-15 (×2): qty 1

## 2020-04-15 MED ORDER — SODIUM CHLORIDE 0.9 % IR SOLN
3000.0000 mL | Status: DC
  Administered 2020-04-15 – 2020-04-16 (×10): 3000 mL

## 2020-04-15 MED ORDER — OXYCODONE HCL 5 MG PO TABS
5.0000 mg | ORAL_TABLET | ORAL | Status: DC | PRN
  Administered 2020-04-15 – 2020-04-16 (×3): 5 mg via ORAL
  Filled 2020-04-15 (×3): qty 1

## 2020-04-15 NOTE — ED Provider Notes (Signed)
TIME SEEN: 3:46 AM  CHIEF COMPLAINT: Urinary retention  HPI: Patient is a 74 year old male with history of prostate cancer he reports is in remission, followed by [facility], hypertension, hyperlipidemia who presents to the emergency department difficulty urinating since 4:30 PM yesterday.  He has never had a Foley catheter in the past and has never had urinary retention.  He denies fevers, nausea or vomiting, dysuria prior to this happening.  States he started peeing blood and passing clots and then was unable to urinate.  He is having in his lower abdomen.  He is not on blood thinners.  States his urologist is Dr. Lovena Neighbours.   ROS: See HPI Constitutional: no fever  Eyes: no drainage  ENT: no runny nose   Cardiovascular:  no chest pain  Resp: no SOB  GI: no vomiting GU: no dysuria Integumentary: no rash  Allergy: no hives  Musculoskeletal: no leg swelling  Neurological: no slurred speech ROS otherwise negative  PAST MEDICAL HISTORY/PAST SURGICAL HISTORY:  Past Medical History:  Diagnosis Date  . Allergy   . Anemia   . Arthritis   . ED (erectile dysfunction)   . Family history of cancer   . Family history of prostate cancer   . Genetic testing 05/02/2018   Multi-Cancer panel (83 genes) @ [facility] - No pathogenic mutations detected  . History of anemia    due to acute blood loss from lower GI bleeds 2007, 2010, 2013  . History of GI diverticular bleed    tranfused 03/ 2010, 01/ 2013/  no blood needed 09/ 2014, 2015  . History of lower GI bleeding 08/2006   post colonoscopy w/ polypectomy -- treatment colonoscopy w/ endo clip at polypectomy site and 2PRBCs  . History of pneumothorax 07/2008   spontaneous-- tx chest tube  . HLD (hyperlipidemia)   . HTN (hypertension)   . Hx of adenomatous colonic polyps   . Hyperplasia of prostate with lower urinary tract symptoms (LUTS)   . Mild obstructive sleep apnea    06-21-2018 per pt study done 8 yrs ago, told borderline osa , no  cpap  . Primary hypogonadism in male   . Prostate cancer [facility]) urologist-  dr winter/  oncologist-  dr Tammi Klippel   first dx 04/ 2015  Stage T1c, PSA 4.94;  last prostate bx 02-25-2018  Stage T1c, Gleason 3+4, PSA 9.45, vol 120cc-- planned treatement external beam radiation  . Sleep apnea   . Wears glasses     MEDICATIONS:  Prior to Admission medications   Medication Sig Start Date End Date Taking? Authorizing Provider  amLODipine (NORVASC) 5 MG tablet Take 5 mg by mouth daily. 07/12/18   [provider]  atorvastatin (LIPITOR) 20 MG tablet Take 20 mg by mouth every morning.     [provider]  EPINEPHrine 0.3 mg/0.3 mL IJ SOAJ injection epinephrine 0.3 mg/0.3 mL injection, auto-injector    [provider]  lisinopril-hydrochlorothiazide (PRINZIDE,ZESTORETIC) 20-25 MG per tablet Take 1 tablet by mouth every morning.  05/30/15   [provider]  Meth-Hyo-M Bl-Na Phos-Ph Sal (URIBEL) 118 MG CAPS Take by mouth.    [provider]  MYRBETRIQ 50 MG TB24 tablet Take 50 mg by mouth daily. 07/17/18   [provider]  oxybutynin (DITROPAN-XL) 10 MG 24 hr tablet Take 10 mg by mouth every morning.     [provider]  tamsulosin (FLOMAX) 0.4 MG CAPS capsule Take 0.4 mg by mouth daily after breakfast.     [provider]  traMADol (ULTRAM) 50 MG tablet tramadol 50 mg tablet  TAKE 1 TABLET BY MOUTH EVERY 6 HOURS AS NEEDED    [provider]    ALLERGIES:  Allergies  Allergen Reactions  . Bee Venom Anaphylaxis    Foaming at mouth, unresponsive  . Penicillins Itching and Rash    SOCIAL HISTORY:  Social History   Tobacco Use  . Smoking status: Never Smoker  . Smokeless tobacco: Current User    Types: Chew  . Tobacco comment: 06-21-2018 chew tobacco since age 45 (since 89s) 27oz/week  (1 yrs)  Substance Use Topics  . Alcohol use: Yes    Comment: weekend--- case of beer    FAMILY HISTORY: Family History  Problem  Relation Age of Onset  . Heart disease Mother   . Diabetes Mother   . Hypertension Mother   . Cancer Father 56       metastatic; unk. primary; deceased 65  . Cancer Maternal Uncle        unk. primary  . Colon cancer Paternal Uncle 46       deceased 51  . Cancer Paternal Aunt        unk. primary; died young  . Prostate cancer Cousin 46       maternal first cousin; son of uncle with metastatic cancer  . Prostate cancer Cousin        paternal first cousin; son of unaffected paternal aunt  . Esophageal cancer Neg Hx   . Rectal cancer Neg Hx   . Stomach cancer Neg Hx     EXAM: BP (!) 167/91 (BP Location: Right Arm)   Pulse 93   Temp 98 F (36.7 C) (Oral)   Resp 18   Ht 6\' 3"  (1.905 m)   Wt 98.4 kg   SpO2 100%   BMI 27.12 kg/m  CONSTITUTIONAL: Alert and oriented and responds appropriately to questions.  Appears uncomfortable, afebrile and nontoxic HEAD: Normocephalic EYES: Conjunctivae clear, pupils appear equal, EOM appear intact ENT: normal nose; moist mucous membranes NECK: Supple, normal ROM CARD: RRR; S1 and S2 appreciated; no murmurs, no clicks, no rubs, no gallops RESP: Normal chest excursion without splinting or tachypnea; breath sounds clear and equal bilaterally; no wheezes, no rhonchi, no rales, no hypoxia or respiratory distress, speaking full sentences ABD/GI: Normal bowel sounds; non-distended; soft, over the suprapubic area, no rebound, no guarding, no peritoneal signs, no hepatosplenomegaly GU: Patient has 16 [place] Foley catheter in place but is draining a small amount of blood around the catheter and has a large amount of blood seen in the catheter and bag with some clots BACK:  The back appears normal EXT: Normal ROM in all joints; no deformity noted, no edema; no cyanosis SKIN: Normal color for age and race; warm; no rash on exposed skin NEURO: Moves all extremities equally PSYCH: The patient's mood and manner are appropriate.   MEDICAL DECISION MAKING:  Patient here with urinary retention.  A 16 french foley catheter was placed by triage nurse.  States he felt better after catheter was placed but now feels like he is not draining again and is passing clots.  Will attempt to irrigate copiously to see if we can clear his urine.  Urinalysis is currently pending but will likely not providing information given the amount of blood.  Will send urine culture.  Will obtain labs.  He denies history of bleeding disorder and is not on blood thinners.  ED PROGRESS: Patient drained approximately  600 mL from his bladder after catheter was placed and then irrigated.  He was irrigated with approximately 2 L of saline but still stays grossly bloody and continue to have abdominal discomfort although improved.  His labs have been reviewed/interpreted and show no significant abnormality.  No leukocytosis, normal hemoglobin, no coagulopathy or thrombocytopenia.  Given he is still having very grossly bloody urine at this time, will discuss with urology on-call.  I am concerned that if we discharge him home in this condition he will start to have clots again that will likely cause return of his urinary retention.   5:40 AM  Spoke with Dr. Eulogio Ditch with urology who will see the patient in the emergency department.  He is requested the urology cart replaced at the bedside.  Patient starting to appear very uncomfortable again.  Will give IV fentanyl, Zofran.   6:48 AM  Dr. Eulogio Ditch has seen patient and will admit for observation.  Please see his note for further information.  Appreciate urology help.   I reviewed all nursing notes and pertinent previous records as available.  I have reviewed and interpreted any EKGs, lab and urine results, imaging (as available).     [name] was evaluated in Emergency Department on 04/15/2020 for the symptoms described in the history of present illness. He was evaluated in the context of the global COVID-19 pandemic, which  necessitated consideration that the patient might be at risk for infection with the SARS-CoV-2 virus that causes COVID-19. Institutional protocols and algorithms that pertain to the evaluation of patients at risk for COVID-19 are in a state of rapid change based on information released by regulatory bodies including the CDC and federal and state organizations. These policies and algorithms were followed during the patient's care in the ED.       Anice Wilshire, Delice Bison, DO 04/15/20 [phone]

## 2020-04-15 NOTE — ED Notes (Signed)
Hung another bag for CBI, no more CBI bags in the department. Secretary notified and is paging materials to bring more STAT.

## 2020-04-15 NOTE — ED Notes (Signed)
Urology MD at the bedside

## 2020-04-15 NOTE — ED Triage Notes (Signed)
Pt sts passing blood clots and unable to empty bladder since 0730 yesterday morning. C/o suprapubic pain. Hx enlarged prostate.

## 2020-04-15 NOTE — ED Notes (Signed)
Attempted to call report to Amy, RN, however, bed placement is switching his bed to the 4th floor, not the 3rd.

## 2020-04-15 NOTE — ED Notes (Signed)
Patient's CBI stopped flowing, Network engineer notified to page MD. MD at bedside irrigating CBI and it is flowing now.

## 2020-04-15 NOTE — Progress Notes (Signed)
Pt c/o pain/spasms in lower abdomen. All of patient medication orders were under signed and held for Post Op. Dr. Diona Fanti notified and gave verbal permission to release medication orders for patient. Will continue with plan of care.

## 2020-04-15 NOTE — H&P (Signed)
H&P  Chief Complaint: Hematuria  History of Present Illness: 74 year old male, patient of Dr. Gilford Rile for management/follow-up of prostate cancer, presented to the emergency room last night with a short history of gross hematuria.  The patient developed retention.  He presented to the emergency room where a small catheter was placed.  He has had irrigation, but has had continuing bleeding and clotting.  The patient has significant pain and urologic consultation is requested.  The patient completed radiotherapy within the past 2 to 3 years.  He does have urinary frequency and urgency and is on an antimuscarinic.  He does have a good stream, however.  Catheterization was not difficult.  He is on no antiplatelet medications.  He did have a previous episode of short-lived gross painless hematuria.  Past Medical History:  Diagnosis Date  . Allergy   . Anemia   . Arthritis   . ED (erectile dysfunction)   . Family history of cancer   . Family history of prostate cancer   . Genetic testing 05/02/2018   Multi-Cancer panel (83 genes) @ [facility] - No pathogenic mutations detected  . History of anemia    due to acute blood loss from lower GI bleeds 2007, 2010, 2013  . History of GI diverticular bleed    tranfused 03/ 2010, 01/ 2013/  no blood needed 09/ 2014, 2015  . History of lower GI bleeding 08/2006   post colonoscopy w/ polypectomy -- treatment colonoscopy w/ endo clip at polypectomy site and 2PRBCs  . History of pneumothorax 07/2008   spontaneous-- tx chest tube  . HLD (hyperlipidemia)   . HTN (hypertension)   . Hx of adenomatous colonic polyps   . Hyperplasia of prostate with lower urinary tract symptoms (LUTS)   . Mild obstructive sleep apnea    06-21-2018 per pt study done 8 yrs ago, told borderline osa , no cpap  . Primary hypogonadism in male   . Prostate cancer [facility]) urologist-  dr winter/  oncologist-  dr Tammi Klippel   first dx 04/ 2015  Stage T1c, PSA 4.94;  last prostate bx 02-25-2018   Stage T1c, Gleason 3+4, PSA 9.45, vol 120cc-- planned treatement external beam radiation  . Sleep apnea   . Wears glasses     Past Surgical History:  Procedure Laterality Date  . COLONOSCOPY W/ POLYPECTOMY  last one 2014  . GOLD SEED IMPLANT N/A 06/26/2018   Procedure: GOLD SEED IMPLANT;  Surgeon: Ceasar Mons, MD;  Location: [facility];  Service: Urology;  Laterality: N/A;  ONLY NEEDS 30 MIN FOR ALL PROCEDURES  . PROSTATE BIOPSY  last one 02-25-2018   dr winter office  . SPACE OAR INSTILLATION N/A 06/26/2018   Procedure: SPACE OAR INSTILLATION;  Surgeon: Ceasar Mons, MD;  Location: [facility];  Service: Urology;  Laterality: N/A;  . TONSILLECTOMY  child  . UMBILICAL HERNIA REPAIR  2012    Home Medications:    Allergies:  Allergies  Allergen Reactions  . Bee Venom Anaphylaxis    Foaming at mouth, unresponsive  . Penicillins Itching and Rash    Family History  Problem Relation Age of Onset  . Heart disease Mother   . Diabetes Mother   . Hypertension Mother   . Cancer Father 45       metastatic; unk. primary; deceased 14  . Cancer Maternal Uncle        unk. primary  . Colon cancer Paternal Uncle 10  deceased 46  . Cancer Paternal Aunt        unk. primary; died young  . Prostate cancer Cousin 65       maternal first cousin; son of uncle with metastatic cancer  . Prostate cancer Cousin        paternal first cousin; son of unaffected paternal aunt  . Esophageal cancer Neg Hx   . Rectal cancer Neg Hx   . Stomach cancer Neg Hx     Social History:  reports that he has never smoked. His smokeless tobacco use includes chew. He reports current alcohol use. He reports that he does not use drugs.  ROS: A complete review of systems was performed.  All systems are negative except for pertinent findings as noted.  Physical Exam:  Vital signs in last 24 hours: BP 118/77   Pulse 70   Temp 98 F (36.7 C) (Oral)    Resp (!) 22   Ht 6\' 3"  (1.905 m)   Wt 98.4 kg   SpO2 99%   BMI 27.12 kg/m  Constitutional:  Alert and oriented, he is in moderate discomfort from bladder spasms and retention Cardiovascular: Regular rate  Respiratory: Normal respiratory effort GI: Abdomen is soft, nontender, nondistended, no abdominal masses.  Genitourinary: Normal male phallus, testes are descended bilaterally and non-tender and without masses, scrotum is normal in appearance without lesions or masses, perineum is normal on inspection.  There is blood present at his meatus, around the 16 [place] Foley catheter Lymphatic: No lymphadenopathy Neurologic: Grossly intact, no focal deficits Psychiatric: Normal mood and affect  Laboratory Data:  Recent Labs    04/15/20 0420  WBC 7.3  HGB 12.5*  HCT 39.3  PLT 214    Recent Labs    04/15/20 0420  NA 136  K 3.4*  CL 101  GLUCOSE 141*  BUN 16  CALCIUM 9.3  CREATININE 0.93     Results for orders placed or performed during the hospital encounter of 04/15/20 (from the past 24 hour(s))  Urinalysis, Routine w reflex microscopic     Status: Abnormal   Collection Time: 04/15/20  2:32 AM  Result Value Ref Range   Color, Urine RED (A) YELLOW   APPearance TURBID (A) CLEAR   Specific Gravity, Urine  1.005 - 1.030    TEST NOT REPORTED DUE TO COLOR INTERFERENCE OF URINE PIGMENT   pH  5.0 - 8.0    TEST NOT REPORTED DUE TO COLOR INTERFERENCE OF URINE PIGMENT   Glucose, UA (A) NEGATIVE mg/dL    TEST NOT REPORTED DUE TO COLOR INTERFERENCE OF URINE PIGMENT   Hgb urine dipstick (A) NEGATIVE    TEST NOT REPORTED DUE TO COLOR INTERFERENCE OF URINE PIGMENT   Bilirubin Urine (A) NEGATIVE    TEST NOT REPORTED DUE TO COLOR INTERFERENCE OF URINE PIGMENT   Ketones, ur (A) NEGATIVE mg/dL    TEST NOT REPORTED DUE TO COLOR INTERFERENCE OF URINE PIGMENT   Protein, ur (A) NEGATIVE mg/dL    TEST NOT REPORTED DUE TO COLOR INTERFERENCE OF URINE PIGMENT   Nitrite (A) NEGATIVE    TEST NOT  REPORTED DUE TO COLOR INTERFERENCE OF URINE PIGMENT   Leukocytes,Ua (A) NEGATIVE    TEST NOT REPORTED DUE TO COLOR INTERFERENCE OF URINE PIGMENT   RBC / HPF >50 (H) 0 - 5 RBC/hpf   WBC, UA 6-10 0 - 5 WBC/hpf   Bacteria, UA RARE (A) NONE SEEN  CBC with Differential     Status: Abnormal  Collection Time: 04/15/20  4:20 AM  Result Value Ref Range   WBC 7.3 4.0 - 10.5 K/uL   RBC 4.60 4.22 - 5.81 MIL/uL   Hemoglobin 12.5 (L) 13.0 - 17.0 g/dL   HCT 39.3 39.0 - 52.0 %   MCV 85.4 80.0 - 100.0 fL   MCH 27.2 26.0 - 34.0 pg   MCHC 31.8 30.0 - 36.0 g/dL   RDW 14.9 11.5 - 15.5 %   Platelets 214 150 - 400 K/uL   nRBC 0.0 0.0 - 0.2 %   Neutrophils Relative % 77 %   Neutro Abs 5.6 1.7 - 7.7 K/uL   Lymphocytes Relative 16 %   Lymphs Abs 1.2 0.7 - 4.0 K/uL   Monocytes Relative 6 %   Monocytes Absolute 0.5 0.1 - 1.0 K/uL   Eosinophils Relative 1 %   Eosinophils Absolute 0.1 0.0 - 0.5 K/uL   Basophils Relative 0 %   Basophils Absolute 0.0 0.0 - 0.1 K/uL   Immature Granulocytes 0 %   Abs Immature Granulocytes 0.02 0.00 - 0.07 K/uL  Basic metabolic panel     Status: Abnormal   Collection Time: 04/15/20  4:20 AM  Result Value Ref Range   Sodium 136 135 - 145 mmol/L   Potassium 3.4 (L) 3.5 - 5.1 mmol/L   Chloride 101 98 - 111 mmol/L   CO2 26 22 - 32 mmol/L   Glucose, Bld 141 (H) 70 - 99 mg/dL   BUN 16 8 - 23 mg/dL   Creatinine, Ser 0.93 0.61 - 1.24 mg/dL   Calcium 9.3 8.9 - 10.3 mg/dL   GFR calc non Af Amer >60 >60 mL/min   GFR calc Af Amer >60 >60 mL/min   Anion gap 9 5 - 15  Protime-INR     Status: None   Collection Time: 04/15/20  4:20 AM  Result Value Ref Range   Prothrombin Time 13.6 11.4 - 15.2 seconds   INR 1.1 0.8 - 1.2   No results found for this or any previous visit (from the past 240 hour(s)).  Renal Function: Recent Labs    04/15/20 0420  CREATININE 0.93   Estimated Creatinine Clearance: 85.8 mL/min (by C-G formula based on SCr of 0.93 mg/dL).   I have reviewed  prior pt notes  I have reviewed notes from referring/previous physicians  I have reviewed urinalysis results  I have independently reviewed prior imaging  Procedure note: The old catheter was removed.  Penis was prepped and draped.  30 cc of 2% viscous lidocaine was administered within the urethra.  24 French coud tip hematuria catheter was then placed.  Balloon filled with 30 cc of water.  Vigorous irrigation was carried out by hand with 2 L of saline.  A fair amount of clot was liberated.  Irrigation was continued until no more clots were present.  He was then hooked to CBI.  Radiologic Imaging: No results found.  Impression/Assessment:  Radiation-induced hemorrhagic cystitis  Plan:  With the patient's bladder spasms and recent hematuria, I think it is best to leave him on CBI  I will admit him for observation, placed on adequate analgesics  Will recheck hemoglobin in the morning

## 2020-04-15 NOTE — ED Notes (Addendum)
Attempted to call report to 3W RN, Amy. She will call back when available.

## 2020-04-16 ENCOUNTER — Encounter (HOSPITAL_COMMUNITY): Payer: Self-pay | Admitting: Emergency Medicine

## 2020-04-16 ENCOUNTER — Other Ambulatory Visit: Payer: Self-pay

## 2020-04-16 ENCOUNTER — Observation Stay (HOSPITAL_COMMUNITY)
Admission: EM | Admit: 2020-04-16 | Discharge: 2020-04-18 | Disposition: A | Discharge status: Home / Self Care | Payer: Medicare Other | Source: Home / Self Care | Attending: Emergency Medicine | Admitting: Emergency Medicine

## 2020-04-16 DIAGNOSIS — R310 Gross hematuria: Secondary | ICD-10-CM

## 2020-04-16 DIAGNOSIS — R338 Other retention of urine: Secondary | ICD-10-CM | POA: Diagnosis present

## 2020-04-16 DIAGNOSIS — R319 Hematuria, unspecified: Secondary | ICD-10-CM | POA: Diagnosis present

## 2020-04-16 LAB — BASIC METABOLIC PANEL
Anion gap: 11 (ref 5–15)
BUN: 12 mg/dL (ref 8–23)
CO2: 26 mmol/L (ref 22–32)
Calcium: 9.3 mg/dL (ref 8.9–10.3)
Chloride: 96 mmol/L — ABNORMAL LOW (ref 98–111)
Creatinine, Ser: 1.2 mg/dL (ref 0.61–1.24)
GFR calc Af Amer: >60 mL/min (ref >60)
GFR calc non Af Amer: >60 mL/min (ref >60)
Glucose, Bld: 120 mg/dL — ABNORMAL HIGH (ref 70–99)
Potassium: 3.3 mmol/L — ABNORMAL LOW (ref 3.5–5.1)
Sodium: 133 mmol/L — ABNORMAL LOW (ref 135–145)

## 2020-04-16 LAB — URINALYSIS, ROUTINE W REFLEX MICROSCOPIC: RBC / HPF: >50 RBC/hpf — ABNORMAL HIGH (ref 0–5)

## 2020-04-16 LAB — URINE CULTURE: Culture: NO GROWTH

## 2020-04-16 LAB — CBC WITH DIFFERENTIAL/PLATELET
Abs Immature Granulocytes: 0.04 10*3/uL (ref 0.00–0.07)
Basophils Absolute: 0 10*3/uL (ref 0.0–0.1)
Basophils Relative: 0 %
Eosinophils Absolute: 0 10*3/uL (ref 0.0–0.5)
Eosinophils Relative: 0 %
HCT: 37 % — ABNORMAL LOW (ref 39.0–52.0)
Hemoglobin: 11.8 g/dL — ABNORMAL LOW (ref 13.0–17.0)
Immature Granulocytes: 0 %
Lymphocytes Relative: 12 %
Lymphs Abs: 1.2 10*3/uL (ref 0.7–4.0)
MCH: 27.2 pg (ref 26.0–34.0)
MCHC: 31.9 g/dL (ref 30.0–36.0)
MCV: 85.3 fL (ref 80.0–100.0)
Monocytes Absolute: 0.7 10*3/uL (ref 0.1–1.0)
Monocytes Relative: 7 %
Neutro Abs: 8.1 10*3/uL — ABNORMAL HIGH (ref 1.7–7.7)
Neutrophils Relative %: 81 %
Platelets: 204 10*3/uL (ref 150–400)
RBC: 4.34 MIL/uL (ref 4.22–5.81)
RDW: 14.7 % (ref 11.5–15.5)
WBC: 10 10*3/uL (ref 4.0–10.5)
nRBC: 0 % (ref 0.0–0.2)

## 2020-04-16 MED ORDER — FINASTERIDE 5 MG PO TABS
5.0000 mg | ORAL_TABLET | Freq: Every day | ORAL | Status: DC
  Administered 2020-04-17: 5 mg via ORAL
  Filled 2020-04-16: qty 1

## 2020-04-16 MED ORDER — LISINOPRIL-HYDROCHLOROTHIAZIDE 20-25 MG PO TABS: 1.0000 | ORAL_TABLET | Freq: Every morning | ORAL | Status: DC

## 2020-04-16 MED ORDER — ATORVASTATIN CALCIUM 10 MG PO TABS
20.0000 mg | ORAL_TABLET | Freq: Every morning | ORAL | Status: DC
  Administered 2020-04-17: 20 mg via ORAL
  Filled 2020-04-16: qty 2

## 2020-04-16 MED ORDER — HYDROCHLOROTHIAZIDE 25 MG PO TABS
25.0000 mg | ORAL_TABLET | Freq: Every day | ORAL | Status: DC
  Administered 2020-04-17: 25 mg via ORAL
  Filled 2020-04-16: qty 1

## 2020-04-16 MED ORDER — CIPROFLOXACIN HCL 500 MG PO TABS
500.0000 mg | ORAL_TABLET | Freq: Two times a day (BID) | ORAL | Status: DC
  Administered 2020-04-16: 500 mg via ORAL
  Filled 2020-04-16: qty 1

## 2020-04-16 MED ORDER — AMLODIPINE BESYLATE 5 MG PO TABS
5.0000 mg | ORAL_TABLET | Freq: Every day | ORAL | Status: DC
  Administered 2020-04-17: 5 mg via ORAL
  Filled 2020-04-16: qty 1

## 2020-04-16 MED ORDER — CIPROFLOXACIN HCL 500 MG PO TABS
500.0000 mg | ORAL_TABLET | Freq: Two times a day (BID) | ORAL | Status: DC
  Administered 2020-04-17 – 2020-04-18 (×4): 500 mg via ORAL
  Filled 2020-04-16 (×4): qty 1

## 2020-04-16 MED ORDER — CHLORHEXIDINE GLUCONATE CLOTH 2 % EX PADS: 6.0000 | MEDICATED_PAD | Freq: Every day | CUTANEOUS | Status: DC

## 2020-04-16 MED ORDER — TAMSULOSIN HCL 0.4 MG PO CAPS
0.4000 mg | ORAL_CAPSULE | Freq: Every day | ORAL | Status: DC
  Administered 2020-04-17 – 2020-04-18 (×2): 0.4 mg via ORAL
  Filled 2020-04-16 (×2): qty 1

## 2020-04-16 MED ORDER — LISINOPRIL 20 MG PO TABS
20.0000 mg | ORAL_TABLET | Freq: Every day | ORAL | Status: DC
  Administered 2020-04-17: 20 mg via ORAL
  Filled 2020-04-16: qty 1

## 2020-04-16 MED ORDER — PHENAZOPYRIDINE HCL 200 MG PO TABS
200.0000 mg | ORAL_TABLET | Freq: Three times a day (TID) | ORAL | 0 refills | Status: DC | PRN
Start: 2020-04-16 — End: 2020-04-18

## 2020-04-16 MED ORDER — CIPROFLOXACIN HCL 500 MG PO TABS: 500.0000 mg | ORAL_TABLET | Freq: Two times a day (BID) | ORAL | 0 refills | Status: DC

## 2020-04-16 MED ORDER — MIRABEGRON ER 25 MG PO TB24
50.0000 mg | ORAL_TABLET | Freq: Every day | ORAL | Status: DC
  Administered 2020-04-17: 50 mg via ORAL
  Filled 2020-04-16 (×2): qty 2

## 2020-04-16 MED ORDER — KCL IN DEXTROSE-NACL 20-5-0.45 MEQ/L-%-% IV SOLN
Freq: Once | INTRAVENOUS | Status: AC
  Filled 2020-04-16: qty 1000

## 2020-04-16 MED ORDER — PHENAZOPYRIDINE HCL 200 MG PO TABS
200.0000 mg | ORAL_TABLET | Freq: Three times a day (TID) | ORAL | 0 refills | Status: DC | PRN
Start: 2020-04-16 — End: 2020-04-16

## 2020-04-16 NOTE — Progress Notes (Signed)
I paged Dr Jeffie Pollock & he returned my call , a few verbal orders were obtained and he will review the chart and add others as needed. Pt is comfortable and denies needing anything

## 2020-04-16 NOTE — Discharge Summary (Signed)
Date of admission: 04/15/2020  Date of discharge: 04/16/2020  Admission diagnosis: Gross hematuria with clot urinary retention  Discharge diagnosis: Same  Secondary diagnoses: History of prostate cancer status post external beam radiation therapy and BPH  History and Physical: For full details, please see admission history and physical. Briefly, [name] is a 74 y.o. year old patient with a history of grade 2 prostate cancer status post external beam radiation therapy in September 2019.  He recently has been dealing with recurrent episodes of painless gross hematuria.  On Wednesday evening, the patient experienced gross hematuria again and went into clot urinary retention, which prompted a visit to the emergency department.  Hospital Course: A 24 French three-way Foley catheter was placed and hand irrigated until clear of clot.  The patient was kept on continuous bladder irrigation while in the hospital without recurrence of gross hematuria.  On 04/16/2020, his Foley catheter was draining without difficulty and he was discharged home with it with plans for a voiding trial on 04/20/2020.  His urine culture is still pending.  He will be discharged home with an empiric course of Cipro.  He was also instructed to continue tamsulosin twice daily, continue finasteride, continue Myrbetriq and stop oxybutynin.  Physical Exam:  General: Alert and oriented CV: RRR, palpable distal pulses Lungs: CTAB, equal chest rise Abdomen: Soft, NTND, no rebound or guarding GU: 24 French three-way Foley catheter in place and draining amber urine without clots.  On minimal CBI Ext: NT, No erythema  Laboratory values:  Recent Labs    04/15/20 0420  HGB 12.5*  HCT 39.3   Recent Labs    04/15/20 0420  CREATININE 0.93    Disposition: Home  Discharge instruction: The patient was instructed to be ambulatory but told to refrain from heavy lifting, strenuous activity, or driving.  Discharge medications:   Allergies as of 04/16/2020      Reactions   Bee Venom Anaphylaxis   Foaming at mouth, unresponsive   Penicillins Itching, Rash      Medication List    STOP taking these medications   oxybutynin 10 MG 24 hr tablet Commonly known as: DITROPAN-XL     TAKE these medications   amLODipine 5 MG tablet Commonly known as: NORVASC Take 5 mg by mouth daily.   atorvastatin 20 MG tablet Commonly known as: LIPITOR Take 20 mg by mouth every morning.   ciprofloxacin 500 MG tablet Commonly known as: CIPRO Take 1 tablet (500 mg total) by mouth 2 (two) times daily. Continue taking 8:30 PM this evening   EPINEPHrine 0.3 mg/0.3 mL Soaj injection Commonly known as: EPI-PEN epinephrine 0.3 mg/0.3 mL injection, auto-injector   finasteride 5 MG tablet Commonly known as: PROSCAR Take 5 mg by mouth daily.   lisinopril-hydrochlorothiazide 20-25 MG tablet Commonly known as: ZESTORETIC Take 1 tablet by mouth every morning.   Myrbetriq 50 MG Tb24 tablet Generic drug: mirabegron ER Take 50 mg by mouth daily.   phenazopyridine 200 MG tablet Commonly known as: Pyridium Take 1 tablet (200 mg total) by mouth 3 (three) times daily as needed (for pain with urination).   tamsulosin 0.4 MG Caps capsule Commonly known as: FLOMAX Take 0.4 mg by mouth daily after breakfast.       Followup:  Follow-up Information    [facility] On 04/20/2020.   Why: My office will call to schedule your follow-up appointment next Tuesday Contact information: [place] [phone]

## 2020-04-16 NOTE — Progress Notes (Signed)
1 Day Post-Op Subjective: Catheter was irrigated 3 times overnight due to clot obstruction.  Draining clear-yellow urine and irrigant now.  Bladder spasms have improved.  No other complaints.   Objective: Vital signs in last 24 hours: Temp:  [98.3 F (36.8 C)-98.7 F (37.1 C)] 98.3 F (36.8 C) (05/21 0429) Pulse Rate:  [51-91] 70 (05/21 0429) Resp:  [14-24] 18 (05/21 0429) BP: (111-181)/(68-95) 116/78 (05/21 0429) SpO2:  [96 %-100 %] 97 % (05/21 0429) Weight:  [96.1 kg] 96.1 kg (05/20 1128)  Intake/Output from previous day: 05/20 0701 - 05/21 0700 In: 25112.4 [P.O.:240; I.V.:72.4] Out: [phone] [Urine:46825]  Intake/Output this shift: Total I/O In: -  Out: 700 [Urine:700]  Physical Exam:  General: Alert and oriented CV: RRR, palpable distal pulses Lungs: CTAB, equal chest rise Abdomen: Soft, NTND, no rebound or guarding Gu: Foley draining clear-yellow urine with minimal CBI Ext: NT, No erythema  Lab Results: Recent Labs    04/15/20 0420  HGB 12.5*  HCT 39.3   BMET Recent Labs    04/15/20 0420  NA 136  K 3.4*  CL 101  CO2 26  GLUCOSE 141*  BUN 16  CREATININE 0.93  CALCIUM 9.3     Studies/Results: No results found.  Assessment/Plan: Hx of Gleason 7 prostate cancer and prostate enlargement, s/p XRT.  Now w/ clot retention  -Continue to wean CBI -OOBTC and ambulate -Flush Foley PRN -Start empiric course of cipro -Will recheck around noon to assess if he is ready for discharge vs continued observation   LOS: 0 days   Garrett Hughs, MD [facility] Pager: [phone]  04/16/2020, 7:36 AM

## 2020-04-16 NOTE — ED Triage Notes (Signed)
Patient presents after having his catheter changed yesterday. Patient called his urologist today because his catheter stopped draining, but then spontaneously began to drain. Patient's urologist told him to come in so we could flush his bladder. Patient with 19mL in bladder, catheter draining at this time.

## 2020-04-16 NOTE — ED Provider Notes (Signed)
[name] Provider Note   CSN: [id number] Arrival date & time: 04/16/20  1951     History Chief Complaint  Patient presents with  . Catheter Issue    [name] is a 74 y.o. male.  Patient is a 61 he has a history of prior prostate cancer status post radiation therapy.  He presented nondraining Foley catheter.  He was seen in the emergency department yesterday and admitted for urinary retention with grossly bloody urine and clots.  He had three-way catheter placed and his bladder was irrigated.  He was admitted for observation.  It seemed that the clots had stopped and his Foley was draining well.  He was discharged earlier today.  However his wife says once he stood up and got in the car to be discharged he started having spasms of his bladder again with return of bloody urine.  His Foley catheter stopped draining.  He called urology who advised him to come back to the emergency room.  When he got here it started draining again.  He is having more bladder spasms however and passing clots.        Past Medical History:  Diagnosis Date  . Allergy   . Anemia   . Arthritis   . ED (erectile dysfunction)   . Family history of cancer   . Family history of prostate cancer   . Genetic testing 05/02/2018   Multi-Cancer panel (83 genes) @ [facility] - No pathogenic mutations detected  . History of anemia    due to acute blood loss from lower GI bleeds 2007, 2010, 2013  . History of GI diverticular bleed    tranfused 03/ 2010, 01/ 2013/  no blood needed 09/ 2014, 2015  . History of lower GI bleeding 08/2006   post colonoscopy w/ polypectomy -- treatment colonoscopy w/ endo clip at polypectomy site and 2PRBCs  . History of pneumothorax 07/2008   spontaneous-- tx chest tube  . HLD (hyperlipidemia)   . HTN (hypertension)   . Hx of adenomatous colonic polyps   . Hyperplasia of prostate with lower urinary tract symptoms (LUTS)   . Mild obstructive  sleep apnea    06-21-2018 per pt study done 8 yrs ago, told borderline osa , no cpap  . Primary hypogonadism in male   . Prostate cancer [facility]) urologist-  dr winter/  oncologist-  dr Tammi Klippel   first dx 04/ 2015  Stage T1c, PSA 4.94;  last prostate bx 02-25-2018  Stage T1c, Gleason 3+4, PSA 9.45, vol 120cc-- planned treatement external beam radiation  . Sleep apnea   . Wears glasses     Patient Active Problem List   Diagnosis Date Noted  . Hematuria 04/15/2020  . Genetic testing 05/02/2018  . Family history of prostate cancer   . Family history of cancer   . Malignant neoplasm of prostate ([place]) 04/01/2018  . Hypotension, unspecified 01/24/2014  . Acute blood loss anemia 01/23/2014  . Other and unspecified hyperlipidemia 01/23/2014  . BPH (benign prostatic hypertrophy) 01/23/2014  . Lower GI bleed 01/23/2014  . HTN (hypertension) 08/11/2013  . Diverticulosis of colon with hemorrhage 12/27/2011  . ADENOMATOUS COLONIC POLYP 01/06/2008  . HEMORRHOIDS, INTERNAL 01/06/2008    Past Surgical History:  Procedure Laterality Date  . COLONOSCOPY W/ POLYPECTOMY  last one 2014  . GOLD SEED IMPLANT N/A 06/26/2018   Procedure: GOLD SEED IMPLANT;  Surgeon: Ceasar Mons, MD;  Location: [facility];  Service: Urology;  Laterality: N/A;  ONLY NEEDS 30 MIN FOR ALL PROCEDURES  . PROSTATE BIOPSY  last one 02-25-2018   dr winter office  . SPACE OAR INSTILLATION N/A 06/26/2018   Procedure: SPACE OAR INSTILLATION;  Surgeon: Ceasar Mons, MD;  Location[facility];  Service: Urology;  Laterality: N/A;  . TONSILLECTOMY  child  . UMBILICAL HERNIA REPAIR  2012       Family History  Problem Relation Age of Onset  . Heart disease Mother   . Diabetes Mother   . Hypertension Mother   . Cancer Father 37       metastatic; unk. primary; deceased 26  . Cancer Maternal Uncle        unk. primary  . Colon cancer Paternal Uncle 26       deceased 45  .  Cancer Paternal Aunt        unk. primary; died young  . Prostate cancer Cousin 35       maternal first cousin; son of uncle with metastatic cancer  . Prostate cancer Cousin        paternal first cousin; son of unaffected paternal aunt  . Esophageal cancer Neg Hx   . Rectal cancer Neg Hx   . Stomach cancer Neg Hx     Social History   Tobacco Use  . Smoking status: Never Smoker  . Smokeless tobacco: Current User    Types: Chew  . Tobacco comment: 06-21-2018 chew tobacco since age 6 (since 102s) 27oz/week  (47 yrs)  Substance Use Topics  . Alcohol use: Yes    Comment: weekend--- case of beer  . Drug use: No    Home Medications Prior to Admission medications   Medication Sig Start Date End Date Taking? Authorizing Provider  amLODipine (NORVASC) 5 MG tablet Take 5 mg by mouth daily. 07/12/18   [provider]  atorvastatin (LIPITOR) 20 MG tablet Take 20 mg by mouth every morning.     [provider]  ciprofloxacin (CIPRO) 500 MG tablet Take 1 tablet (500 mg total) by mouth 2 (two) times daily. Continue taking 8:30 PM this evening 04/16/20   Ceasar Mons, MD  EPINEPHrine 0.3 mg/0.3 mL IJ SOAJ injection epinephrine 0.3 mg/0.3 mL injection, auto-injector    [provider]  finasteride (PROSCAR) 5 MG tablet Take 5 mg by mouth daily. 03/15/20   [provider]  lisinopril-hydrochlorothiazide (PRINZIDE,ZESTORETIC) 20-25 MG per tablet Take 1 tablet by mouth every morning.  05/30/15   [provider]  MYRBETRIQ 50 MG TB24 tablet Take 50 mg by mouth daily. 07/17/18   [provider]  phenazopyridine (PYRIDIUM) 200 MG tablet Take 1 tablet (200 mg total) by mouth 3 (three) times daily as needed (for pain with urination). 04/16/20 04/16/21  Ceasar Mons, MD  tamsulosin (FLOMAX) 0.4 MG CAPS capsule Take 0.4 mg by mouth daily after breakfast.     [provider]    Allergies    Bee venom and Penicillins  Review  of Systems   Review of Systems  Constitutional: Negative for chills, diaphoresis, fatigue and fever.  HENT: Negative for congestion, rhinorrhea and sneezing.   Eyes: Negative.   Respiratory: Negative for cough, chest tightness and shortness of breath.   Cardiovascular: Negative for chest pain and leg swelling.  Gastrointestinal: Negative for abdominal pain, blood in stool, diarrhea, nausea and vomiting.  Genitourinary: Positive for dysuria and hematuria. Negative for difficulty urinating, flank pain and frequency.  Musculoskeletal: Negative for arthralgias  and back pain.  Skin: Negative for rash.  Neurological: Negative for dizziness, speech difficulty, weakness, numbness and headaches.    Physical Exam Updated Vital Signs BP (!) 143/88   Pulse 80   Temp 99.1 F (37.3 C)   Resp 16   SpO2 99%   Physical Exam Constitutional:      Appearance: He is well-developed.  HENT:     Head: Normocephalic and atraumatic.  Eyes:     Pupils: Pupils are equal, round, and reactive to light.  Cardiovascular:     Rate and Rhythm: Normal rate and regular rhythm.     Heart sounds: Normal heart sounds.  Pulmonary:     Effort: Pulmonary effort is normal. No respiratory distress.     Breath sounds: Normal breath sounds. No wheezing or rales.  Chest:     Chest wall: No tenderness.  Abdominal:     General: Bowel sounds are normal.     Palpations: Abdomen is soft.     Tenderness: There is no abdominal tenderness. There is no guarding or rebound.  Genitourinary:    Comments: Foley catheter in place.  There is blood-tinged urine draining in the bag. Musculoskeletal:        General: Normal range of motion.     Cervical back: Normal range of motion and neck supple.  Lymphadenopathy:     Cervical: No cervical adenopathy.  Skin:    General: Skin is warm and dry.     Findings: No rash.  Neurological:     Mental Status: He is alert and oriented to person, place, and time.     ED Results /  Procedures / Treatments   Labs (all labs ordered are listed, but only abnormal results are displayed) Labs Reviewed  CBC WITH DIFFERENTIAL/PLATELET - Abnormal; Notable for the following components:      Result Value   Hemoglobin 11.8 (*)    HCT 37.0 (*)    Neutro Abs 8.1 (*)    All other components within normal limits  BASIC METABOLIC PANEL - Abnormal; Notable for the following components:   Sodium 133 (*)    Potassium 3.3 (*)    Chloride 96 (*)    Glucose, Bld 120 (*)    All other components within normal limits  URINE CULTURE  URINALYSIS, ROUTINE W REFLEX MICROSCOPIC    EKG None  Radiology No results found.  Procedures Procedures (including critical care time)  Medications Ordered in ED Medications  dextrose 5 % and 0.45 % NaCl with KCl 20 mEq/L infusion (has no administration in time range)    ED Course  I have reviewed the triage vital signs and the nursing notes.  Pertinent labs & imaging results that were available during my care of the patient were reviewed by me and considered in my medical decision making (see chart for details).    MDM Rules/Calculators/A&P                      Patient presents with gross hematuria following recent discharge earlier today.  I spoke with Dr. Jeffie Pollock with urology who will readmit the patient to the hospital.  His labs show mild hyponatremia and hypokalemia similar to his recent values.  He was started on IV fluids per Dr. Denton Lank' request.  Will remain n.p.o. after midnight. Final Clinical Impression(s) / ED Diagnoses Final diagnoses:  Gross hematuria    Rx / DC Orders ED Discharge Orders    None  Malvin Johns, MD 04/16/20 2214

## 2020-04-17 ENCOUNTER — Observation Stay (HOSPITAL_COMMUNITY): Payer: Medicare Other

## 2020-04-17 DIAGNOSIS — R338 Other retention of urine: Secondary | ICD-10-CM | POA: Diagnosis present

## 2020-04-17 LAB — SARS CORONAVIRUS 2 BY RT PCR (HOSPITAL ORDER, PERFORMED IN ~~LOC~~ HOSPITAL LAB): SARS Coronavirus 2: NEGATIVE

## 2020-04-17 MED ORDER — CHLORHEXIDINE GLUCONATE CLOTH 2 % EX PADS
6.0000 | MEDICATED_PAD | Freq: Every day | CUTANEOUS | Status: DC
  Administered 2020-04-17: 6 via TOPICAL

## 2020-04-17 MED ORDER — BISACODYL 10 MG RE SUPP: 10.0000 mg | Freq: Every day | RECTAL | Status: DC | PRN

## 2020-04-17 MED ORDER — ONDANSETRON HCL 4 MG/2ML IJ SOLN: 4.0000 mg | INTRAMUSCULAR | Status: DC | PRN

## 2020-04-17 MED ORDER — MUPIROCIN 2 % EX OINT
1.0000 "application " | TOPICAL_OINTMENT | Freq: Two times a day (BID) | CUTANEOUS | Status: DC
  Administered 2020-04-17: 1 via NASAL
  Filled 2020-04-17: qty 22

## 2020-04-17 MED ORDER — BELLADONNA ALKALOIDS-OPIUM 16.2-60 MG RE SUPP
1.0000 | Freq: Four times a day (QID) | RECTAL | Status: DC | PRN
  Filled 2020-04-17: qty 1

## 2020-04-17 MED ORDER — HYDROCODONE-ACETAMINOPHEN 5-325 MG PO TABS: 1.0000 | ORAL_TABLET | ORAL | Status: DC | PRN

## 2020-04-17 MED ORDER — FLEET ENEMA 7-19 GM/118ML RE ENEM: 1.0000 | ENEMA | Freq: Once | RECTAL | Status: DC | PRN

## 2020-04-17 MED ORDER — MORPHINE SULFATE (PF) 2 MG/ML IV SOLN: 2.0000 mg | INTRAVENOUS | Status: DC | PRN

## 2020-04-17 NOTE — Plan of Care (Signed)
Pt has a foley catheter inserted 04/15/20 (16 fr placed then removed ad a 79fr coude inserted by Dr Luberta Robertson

## 2020-04-17 NOTE — Progress Notes (Signed)
[name] was discharged earlier today following an admission for gross hematuria.   He had recurrent bleeding shortly after discharge and was in pain with retention and I had him return to the ER.     He was irrigated with clot return but the foley is reported as draining.  He continues to have intermittent spasms and is readmitted for monitoring and possible cystoscopy and clot evacuation on 5/22 if he doesn't improve.    ED note, Discharge summary and labs reviewed.

## 2020-04-17 NOTE — H&P (Signed)
H&P  Chief Complaint: Hematuria  History of Present Illness: 74 year old male, patient of Dr. Gilford Rile for management/follow-up of prostate cancer and gross hematuria who was discharged from the hospital yesterday after and admission for gross hematuria.   He contacted me yesterday with recurrent clot retention and severe bladder spasms and was directed to return to the ER.   He was irrigated with return of clots last night but is doing better this morning with clear urine in the tube and decreased incidence of spasm.   The patient completed radiotherapy within the past 2 to 3 years.  He does have urinary frequency and urgency and is on an antimuscarinic.  He does have a good stream, however.  Catheterization was not difficult.  He is on no antiplatelet medications.  He did have a previous episode of short-lived gross painless hematuria.  Past Medical History:  Diagnosis Date  . Allergy   . Anemia   . Arthritis   . ED (erectile dysfunction)   . Family history of cancer   . Family history of prostate cancer   . Genetic testing 05/02/2018   Multi-Cancer panel (83 genes) @ [facility] - No pathogenic mutations detected  . History of anemia    due to acute blood loss from lower GI bleeds 2007, 2010, 2013  . History of GI diverticular bleed    tranfused 03/ 2010, 01/ 2013/  no blood needed 09/ 2014, 2015  . History of lower GI bleeding 08/2006   post colonoscopy w/ polypectomy -- treatment colonoscopy w/ endo clip at polypectomy site and 2PRBCs  . History of pneumothorax 07/2008   spontaneous-- tx chest tube  . HLD (hyperlipidemia)   . HTN (hypertension)   . Hx of adenomatous colonic polyps   . Hyperplasia of prostate with lower urinary tract symptoms (LUTS)   . Mild obstructive sleep apnea    06-21-2018 per pt study done 8 yrs ago, told borderline osa , no cpap  . Primary hypogonadism in male   . Prostate cancer [facility]) urologist-  dr winter/  oncologist-  dr Tammi Klippel   first dx 04/ 2015  Stage  T1c, PSA 4.94;  last prostate bx 02-25-2018  Stage T1c, Gleason 3+4, PSA 9.45, vol 120cc-- planned treatement external beam radiation  . Sleep apnea   . Wears glasses     Past Surgical History:  Procedure Laterality Date  . COLONOSCOPY W/ POLYPECTOMY  last one 2014  . GOLD SEED IMPLANT N/A 06/26/2018   Procedure: GOLD SEED IMPLANT;  Surgeon: Ceasar Mons, MD;  Location: [facility];  Service: Urology;  Laterality: N/A;  ONLY NEEDS 30 MIN FOR ALL PROCEDURES  . PROSTATE BIOPSY  last one 02-25-2018   dr winter office  . SPACE OAR INSTILLATION N/A 06/26/2018   Procedure: SPACE OAR INSTILLATION;  Surgeon: Ceasar Mons, MD;  Location[facility];  Service: Urology;  Laterality: N/A;  . TONSILLECTOMY  child  . UMBILICAL HERNIA REPAIR  2012    Home Medications: amliodipine, atorvastatin, cipro, finasteride, HCTZ, lisinopril, Myrbetriq, pyridium, tamsulosin.      Allergies:  Allergies  Allergen Reactions  . Bee Venom Anaphylaxis    Foaming at mouth, unresponsive  . Penicillins Itching and Rash    Family History  Problem Relation Age of Onset  . Heart disease Mother   . Diabetes Mother   . Hypertension Mother   . Cancer Father 21       metastatic; unk. primary; deceased 30  . Cancer  Maternal Uncle        unk. primary  . Colon cancer Paternal Uncle 10       deceased 55  . Cancer Paternal Aunt        unk. primary; died young  . Prostate cancer Cousin 59       maternal first cousin; son of uncle with metastatic cancer  . Prostate cancer Cousin        paternal first cousin; son of unaffected paternal aunt  . Esophageal cancer Neg Hx   . Rectal cancer Neg Hx   . Stomach cancer Neg Hx     Social History:  reports that he has never smoked. His smokeless tobacco use includes chew. He reports current alcohol use. He reports that he does not use drugs.  ROS: A complete review of systems was performed.  All systems are negative  except for pertinent findings as noted.  Physical Exam:  Vital signs in last 24 hours: BP 118/71 (BP Location: Left Arm)   Pulse (!) 54   Temp 98.6 F (37 C) (Oral)   Resp 20   SpO2 100%  Constitutional:  Alert and oriented, he is in moderate discomfort from bladder spasms and retention Cardiovascular: Regular rate  Respiratory: Normal respiratory effort GI: Abdomen is slightly tender without mass in the suprapubic area.  Neurologic: Grossly intact, no focal deficits Psychiatric: Normal mood and affect  Laboratory Data:  Recent Labs    04/15/20 0420 04/16/20 2121  WBC 7.3 10.0  HGB 12.5* 11.8*  HCT 39.3 37.0*  PLT 214 204    Recent Labs    04/15/20 0420 04/16/20 2121  NA 136 133*  K 3.4* 3.3*  CL 101 96*  GLUCOSE 141* 120*  BUN 16 12  CALCIUM 9.3 9.3  CREATININE 0.93 1.20      Recent Results (from the past 240 hour(s))  Urine culture     Status: None   Collection Time: 04/15/20  2:32 AM   Specimen: In/Out Cath Urine  Result Value Ref Range Status   Specimen Description   Final    IN/OUT CATH URINE Performed at [facility], [place] [address], [place], [place] [address]    Special Requests   Final    NONE Performed at [facility], [place] [address] [place], [place] [address]    Culture   Final    NO GROWTH Performed at [facility], [place] [address] [place], [place] [address]    Report Status 04/16/2020 FINAL  Final  SARS Coronavirus 2 by RT PCR (hospital order, performed in [facility] hospital lab) Nasopharyngeal Nasopharyngeal Swab     Status: None   Collection Time: 04/15/20  6:50 AM   Specimen: Nasopharyngeal Swab  Result Value Ref Range Status   SARS Coronavirus 2 NEGATIVE NEGATIVE Final    Comment: (NOTE) SARS-CoV-2 target nucleic acids are NOT DETECTED. The SARS-CoV-2 RNA is generally detectable in upper and lower respiratory specimens during the acute phase of infection. The lowest concentration of  SARS-CoV-2 viral copies this assay can detect is 250 copies / mL. A negative result does not preclude SARS-CoV-2 infection and should not be used as the sole basis for treatment or other patient management decisions.  A negative result may occur with improper specimen collection / handling, submission of specimen other than nasopharyngeal swab, presence of viral mutation(s) within the areas targeted by this assay, and inadequate number of viral copies (<250 copies / mL). A negative result  must be combined with clinical observations, patient history, and epidemiological information. Fact Sheet for Patients:   StrictlyIdeas.no Fact Sheet for Healthcare Providers: BankingDealers.co.za This test is not yet approved or cleared  by the [place] FDA and has been authorized for detection and/or diagnosis of SARS-CoV-2 by FDA under an Emergency Use Authorization (EUA).  This EUA will remain in effect (meaning this test can be used) for the duration of the COVID-19 declaration under Section 564(b)(1) of the Act, 21 U.S.C. section 360bbb-3(b)(1), unless the authorization is terminated or revoked sooner. Performed at [facility], [place] [address] [place], [place] [address]   SARS Coronavirus 2 by RT PCR (hospital order, performed in [facility] hospital lab) Nasopharyngeal Nasopharyngeal Swab     Status: None   Collection Time: 04/16/20 11:01 PM   Specimen: Nasopharyngeal Swab  Result Value Ref Range Status   SARS Coronavirus 2 NEGATIVE NEGATIVE Final    Comment: (NOTE) SARS-CoV-2 target nucleic acids are NOT DETECTED. The SARS-CoV-2 RNA is generally detectable in upper and lower respiratory specimens during the acute phase of infection. The lowest concentration of SARS-CoV-2 viral copies this assay can detect is 250 copies / mL. A negative result does not preclude SARS-CoV-2 infection and should not be used as the sole basis  for treatment or other patient management decisions.  A negative result may occur with improper specimen collection / handling, submission of specimen other than nasopharyngeal swab, presence of viral mutation(s) within the areas targeted by this assay, and inadequate number of viral copies (<250 copies / mL). A negative result must be combined with clinical observations, patient history, and epidemiological information. Fact Sheet for Patients:   StrictlyIdeas.no Fact Sheet for Healthcare Providers: BankingDealers.co.za This test is not yet approved or cleared  by the [place] FDA and has been authorized for detection and/or diagnosis of SARS-CoV-2 by FDA under an Emergency Use Authorization (EUA).  This EUA will remain in effect (meaning this test can be used) for the duration of the COVID-19 declaration under Section 564(b)(1) of the Act, 21 U.S.C. section 360bbb-3(b)(1), unless the authorization is terminated or revoked sooner. Performed at [facility], [place] [address] [place], [place] [address]     Renal Function: Recent Labs    04/15/20 0420 04/16/20 2121  CREATININE 0.93 1.20   Estimated Creatinine Clearance: 66.5 mL/min (by C-G formula based on SCr of 1.2 mg/dL).   I have reviewed prior pt notes  I have reviewed notes from referring/previous physicians  I have reviewed urinalysis results  I have independently reviewed prior imaging    Radiologic Imaging: No results found.  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   Plan:  Continue foley drainage pending a bladder US to assess for residual clots.   I will admit him for observation, placed on adequate analgesics

## 2020-04-18 LAB — URINE CULTURE: Culture: NO GROWTH

## 2020-04-18 NOTE — Discharge Summary (Signed)
Physician Discharge Summary  Patient ID: [name] MRN: [id number] DOB/AGE: 1946/03/30 74 y.o.  Admit date: 04/16/2020 Discharge date: 04/18/2020  Admission Diagnoses:  Clot retention of urine  Discharge Diagnoses:  Principal Problem:   Clot retention of urine Active Problems:   Hematuria   Past Medical History:  Diagnosis Date  . Allergy   . Anemia   . Arthritis   . ED (erectile dysfunction)   . Family history of cancer   . Family history of prostate cancer   . Genetic testing 05/02/2018   Multi-Cancer panel (83 genes) @ [facility] - No pathogenic mutations detected  . History of anemia    due to acute blood loss from lower GI bleeds 2007, 2010, 2013  . History of GI diverticular bleed    tranfused 03/ 2010, 01/ 2013/  no blood needed 09/ 2014, 2015  . History of lower GI bleeding 08/2006   post colonoscopy w/ polypectomy -- treatment colonoscopy w/ endo clip at polypectomy site and 2PRBCs  . History of pneumothorax 07/2008   spontaneous-- tx chest tube  . HLD (hyperlipidemia)   . HTN (hypertension)   . Hx of adenomatous colonic polyps   . Hyperplasia of prostate with lower urinary tract symptoms (LUTS)   . Mild obstructive sleep apnea    06-21-2018 per pt study done 8 yrs ago, told borderline osa , no cpap  . Primary hypogonadism in male   . Prostate cancer [facility]) urologist-  dr winter/  oncologist-  dr Tammi Klippel   first dx 04/ 2015  Stage T1c, PSA 4.94;  last prostate bx 02-25-2018  Stage T1c, Gleason 3+4, PSA 9.45, vol 120cc-- planned treatement external beam radiation  . Sleep apnea   . Wears glasses     Surgeries:  on * No surgery found *   Consultants (if any):   Discharged Condition: Improved  Hospital Course: [name] is an 74 y.o. male who was admitted 04/16/2020 with a diagnosis of Clot retention of urine.  He was discharged home on 5/21 with a foley following an admission for clot retention but returned on 5/22 with an obstructed catheter and severe  spasms.  He continued to have spasms despite bladder decompression and after a bladder US showed only minimal residual clot yesterday, the foley was removed and he voided a few small clots and has continued to void well since with resolution of the pain.  He will be discharged home.  He will continue tamsulosin and finasteride but will not need the Myrbetriq that was prescribed previously.   He was given perioperative antibiotics:  Anti-infectives (From admission, onward)   Start     Dose/Rate Route Frequency Ordered Stop   04/17/20 0000  ciprofloxacin (CIPRO) tablet 500 mg    Note to Pharmacy: Continue taking 8:30 PM this evening     500 mg Oral 2 times daily 04/16/20 2358      .  He was given sequential compression devices for DVT prophylaxis.  He benefited maximally from the hospital stay and there were no complications.    Recent vital signs:  Vitals:   04/17/20 2127 04/18/20 0528  BP: 135/74 100/62  Pulse: 69 62  Resp: 19 20  Temp: 98.4 F (36.9 C) 97.6 F (36.4 C)  SpO2: 99% 99%    Recent laboratory studies:  Lab Results  Component Value Date   HGB 11.8 (L) 04/16/2020   HGB 12.5 (L) 04/15/2020   HGB 15.0 06/26/2018   Lab Results  Component  Value Date   WBC 10.0 04/16/2020   PLT 204 04/16/2020   Lab Results  Component Value Date   INR 1.1 04/15/2020   Lab Results  Component Value Date   NA 133 (L) 04/16/2020   K 3.3 (L) 04/16/2020   CL 96 (L) 04/16/2020   CO2 26 04/16/2020   BUN 12 04/16/2020   CREATININE 1.20 04/16/2020   GLUCOSE 120 (H) 04/16/2020    Discharge Medications:   Allergies as of 04/18/2020      Reactions   Bee Venom Anaphylaxis   Foaming at mouth, unresponsive   Penicillins Itching, Rash      Medication List    STOP taking these medications   Myrbetriq 50 MG Tb24 tablet Generic drug: mirabegron ER   phenazopyridine 200 MG tablet Commonly known as: Pyridium     TAKE these medications   amLODipine 5 MG tablet Commonly known as:  NORVASC Take 5 mg by mouth daily.   atorvastatin 20 MG tablet Commonly known as: LIPITOR Take 20 mg by mouth every morning.   ciprofloxacin 500 MG tablet Commonly known as: CIPRO Take 1 tablet (500 mg total) by mouth 2 (two) times daily. Continue taking 8:30 PM this evening   EPINEPHrine 0.3 mg/0.3 mL Soaj injection Commonly known as: EPI-PEN epinephrine 0.3 mg/0.3 mL injection, auto-injector   finasteride 5 MG tablet Commonly known as: PROSCAR Take 5 mg by mouth daily.   lisinopril-hydrochlorothiazide 20-25 MG tablet Commonly known as: ZESTORETIC Take 1 tablet by mouth every morning.   tamsulosin 0.4 MG Caps capsule Commonly known as: FLOMAX Take 0.4 mg by mouth daily after breakfast.       Diagnostic Studies: US PELVIS LIMITED (TRANSABDOMINAL ONLY)  Result Date: 04/17/2020 CLINICAL DATA:  Hematuria, evaluate for residual thrombus in the bladder EXAM: LIMITED ULTRASOUND OF PELVIS TECHNIQUE: Limited transabdominal ultrasound examination of the pelvis was performed. COMPARISON:  None. FINDINGS: Bladder is decompressed by Foley catheter. Prostate is within normal limits. Minimal echogenic material is noted posterior to Foley catheter likely related to residual thrombus. IMPRESSION: Bladder is decompressed by Foley catheter. Minimal residual thrombus is noted within the bladder lumen. Electronically Signed   By: Inez Catalina M.D.   On: 04/17/2020 09:23    Disposition: Discharge disposition: 01-Home or Self Care       Discharge Instructions    Discontinue IV   Complete by: As directed       Follow-up Information    Winter, Christopher Aaron, MD Follow up.   Specialty: Urology Why: Keep previously scheduled f/u Contact information: [address] [place] [address] [phone]            Signed: Irine Seal 04/18/2020, 8:53 AM

## 2020-04-18 NOTE — Discharge Instructions (Signed)
Hematuria, Adult Hematuria is blood in the urine. Blood may be visible in the urine, or it may be identified with a test. This condition can be caused by infections of the bladder, urethra, kidney, or prostate. Other possible causes include:  Kidney stones.  Cancer of the urinary tract.  Too much calcium in the urine.  Conditions that are passed from parent to child (inherited conditions).  Exercise that requires a lot of energy. Infections can usually be treated with medicine, and a kidney stone usually will pass through your urine. If neither of these is the cause of your hematuria, more tests may be needed to identify the cause of your symptoms. It is very important to tell your health care provider about any blood in your urine, even if it is painless or the blood stops without treatment. Blood in the urine, when it happens and then stops and then happens again, can be a symptom of a very serious condition, including cancer. There is no pain in the initial stages of many urinary cancers. Follow these instructions at home: Medicines  Take over-the-counter and prescription medicines only as told by your health care provider.  If you were prescribed an antibiotic medicine, take it as told by your health care provider. Do not stop taking the antibiotic even if you start to feel better. Eating and drinking  Drink enough fluid to keep your urine clear or pale yellow. It is recommended that you drink 3-4 quarts (2.8-3.8 L) a day. If you have been diagnosed with an infection, it is recommended that you drink cranberry juice in addition to large amounts of water.  Avoid caffeine, tea, and carbonated beverages. These tend to irritate the bladder.  Avoid alcohol because it may irritate the prostate (men). General instructions  If you have been diagnosed with a kidney stone, follow your health care provider's instructions about straining your urine to catch the stone.  Empty your bladder  often. Avoid holding urine for long periods of time.  If you are male: ? After a bowel movement, wipe from front to back and use each piece of toilet paper only once. ? Empty your bladder before and after sex.  Pay attention to any changes in your symptoms. Tell your health care provider about any changes or any new symptoms.  It is your responsibility to get your test results. Ask your health care provider, or the department performing the test, when your results will be ready.  Keep all follow-up visits as told by your health care provider. This is important. Contact a health care provider if:  You develop back pain.  You have a fever.  You have nausea or vomiting.  Your symptoms do not improve after 3 days.  Your symptoms get worse. Get help right away if:  You develop severe vomiting and are unable take medicine without vomiting.  You develop severe pain in your back or abdomen even though you are taking medicine.  You pass a large amount of blood in your urine.  You pass blood clots in your urine.  You feel very weak or like you might faint.  You faint. Summary  Hematuria is blood in the urine. It has many possible causes.  It is very important that you tell your health care provider about any blood in your urine, even if it is painless or the blood stops without treatment.  Take over-the-counter and prescription medicines only as told by your health care provider.  Drink enough fluid to keep   your urine clear or pale yellow. This information is not intended to replace advice given to you by your health care provider. Make sure you discuss any questions you have with your health care provider. Document Revised: 04/09/2019 Document Reviewed: 12/16/2016 Elsevier Patient Education  2020 Elsevier Inc.  

## 2020-06-22 IMAGING — US US PELVIS LIMITED
1 series · 14 of 18 positions shown · non-contrast
Comparison: None.

CLINICAL DATA: Hematuria, evaluate for residual thrombus in the
bladder

EXAM:
LIMITED ULTRASOUND OF PELVIS
TECHNIQUE: Limited transabdominal ultrasound examination of the pelvis was
performed.

[Series 1: us pelvis limited · 18 acquisitions, 14 frames shown]
[im 1/18]
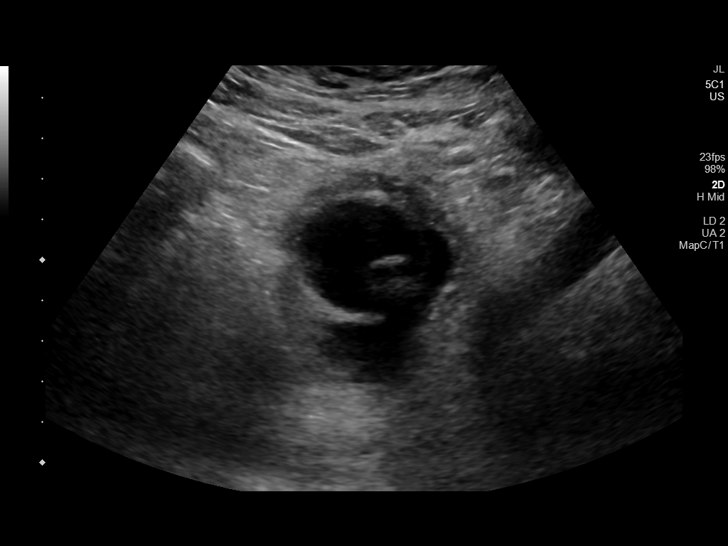
[im 2/18]
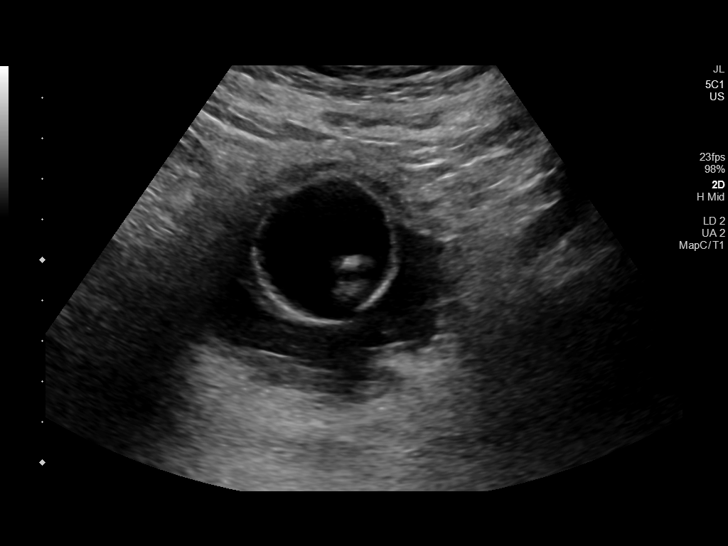
[im 4/18]
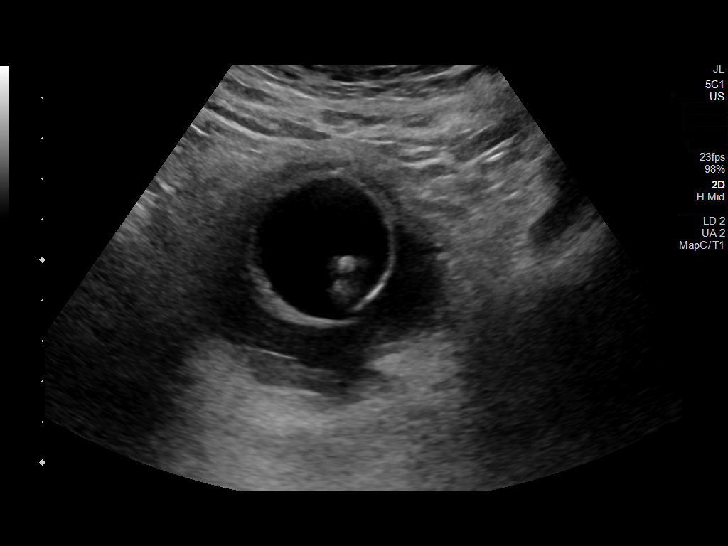
[im 5/18]
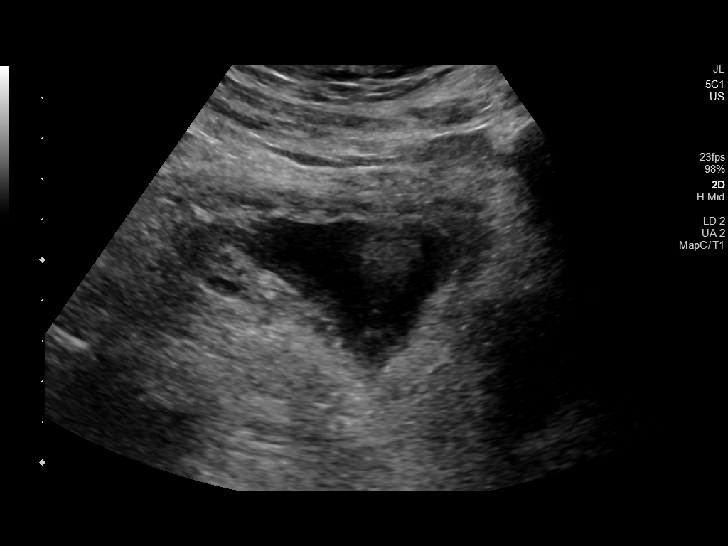
[im 6/18]
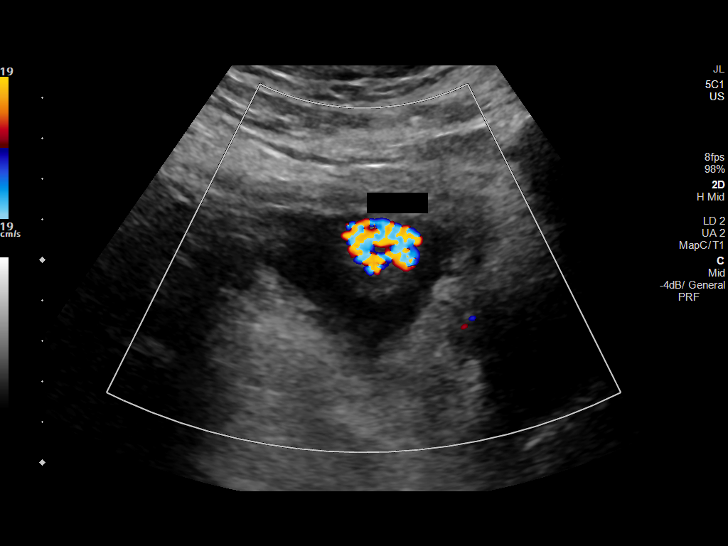
[im 8/18]
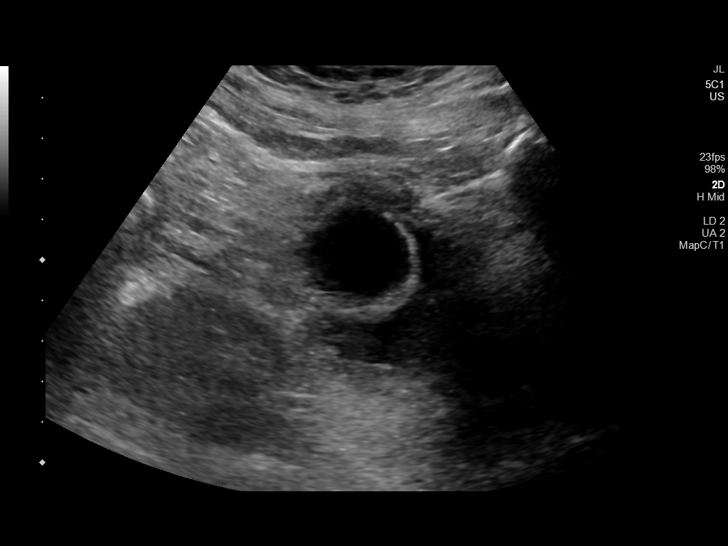
[im 9/18]
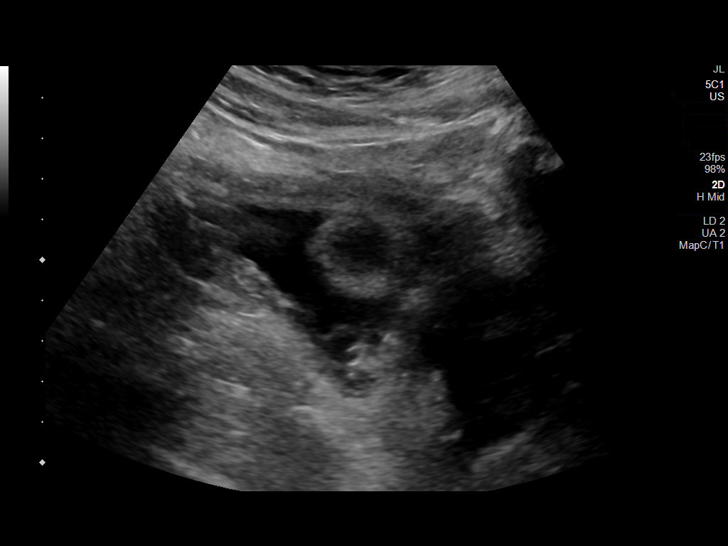
[im 10/18]
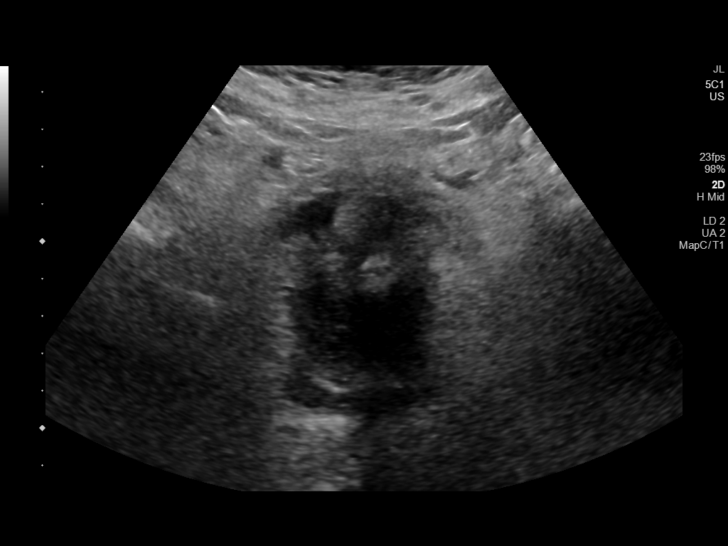
[im 11/18]
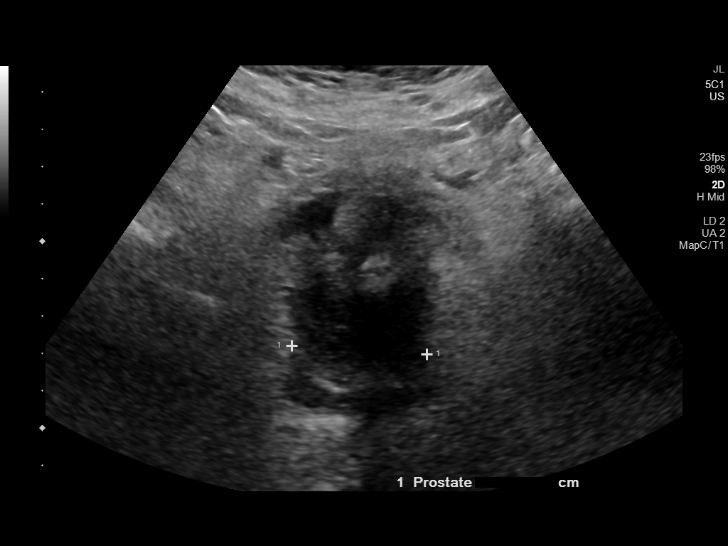
[im 13/18]
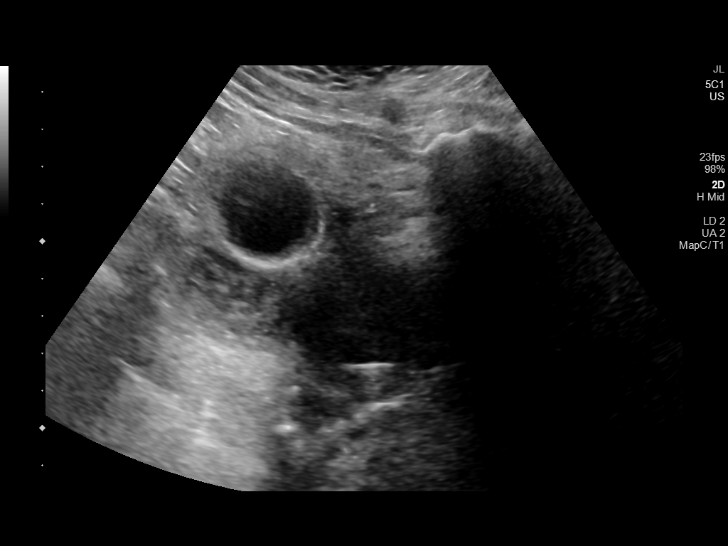
[im 14/18]
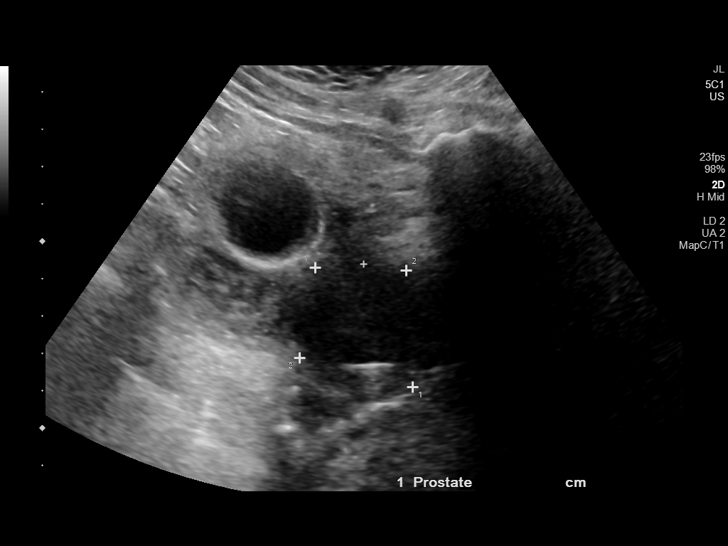
[im 15/18]
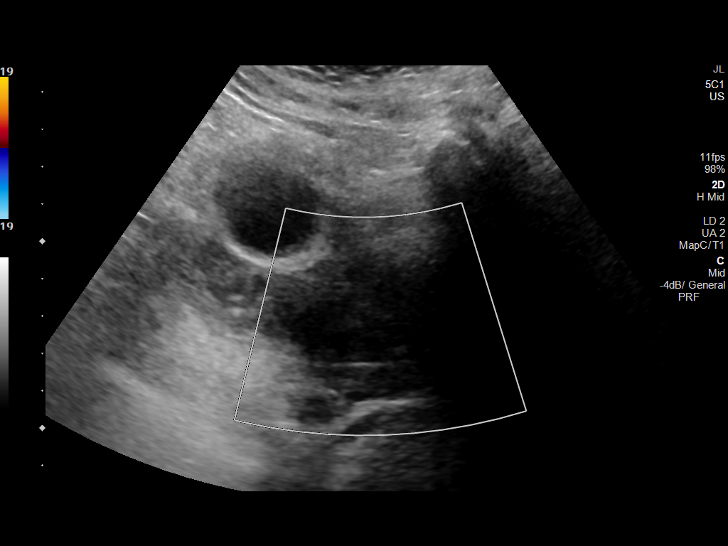
[im 17/18]
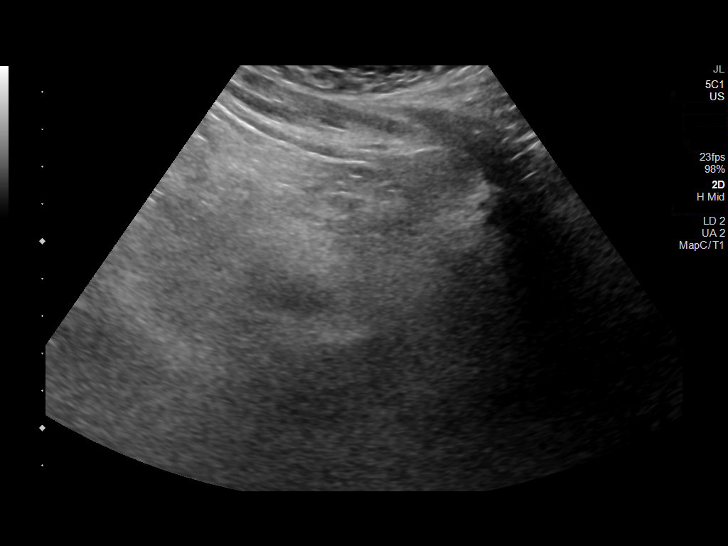
[im 18/18]
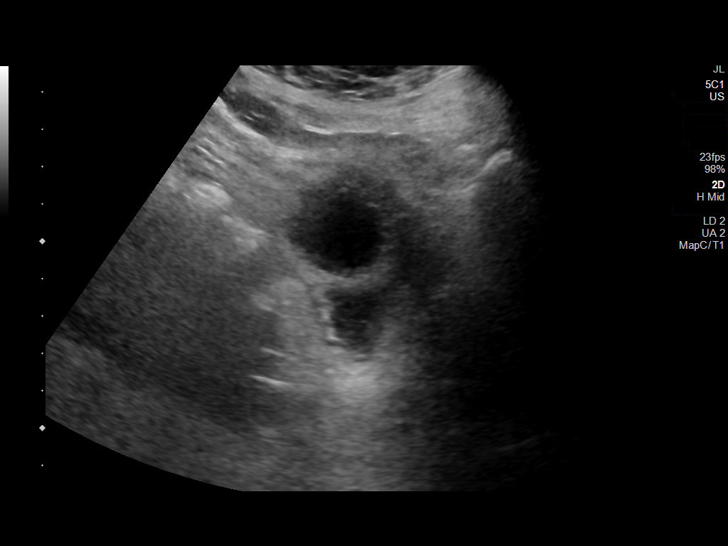

[14 of 18 positions shown; findings below may reference images not displayed]

FINDINGS: Bladder is decompressed by Foley catheter. Prostate is within normal
limits. Minimal echogenic material is noted posterior to Foley
catheter likely related to residual thrombus.
IMPRESSION: Bladder is decompressed by Foley catheter. Minimal residual thrombus
is noted within the bladder lumen.

## 2020-12-10 ENCOUNTER — Encounter: Payer: Self-pay | Admitting: Genetic Counselor

## 2020-12-10 NOTE — Progress Notes (Signed)
Update: SDHA c.830C>T ([id number]) VUS was amended to "Likely Benign".  The updated report date is December 09, 2020.

## 2021-02-08 DIAGNOSIS — H401131 Primary open-angle glaucoma, bilateral, mild stage: Secondary | ICD-10-CM | POA: Diagnosis not present

## 2021-08-16 DIAGNOSIS — H401131 Primary open-angle glaucoma, bilateral, mild stage: Secondary | ICD-10-CM | POA: Diagnosis not present

## 2021-10-19 ENCOUNTER — Encounter: Payer: Self-pay | Admitting: Gastroenterology

## 2021-10-27 DIAGNOSIS — M542 Cervicalgia: Secondary | ICD-10-CM | POA: Diagnosis not present

## 2021-11-14 DIAGNOSIS — R350 Frequency of micturition: Secondary | ICD-10-CM | POA: Diagnosis not present

## 2021-12-13 DIAGNOSIS — H401131 Primary open-angle glaucoma, bilateral, mild stage: Secondary | ICD-10-CM | POA: Diagnosis not present

## 2021-12-26 DIAGNOSIS — E1169 Type 2 diabetes mellitus with other specified complication: Secondary | ICD-10-CM | POA: Diagnosis not present

## 2021-12-26 DIAGNOSIS — I10 Essential (primary) hypertension: Secondary | ICD-10-CM | POA: Diagnosis not present

## 2021-12-26 DIAGNOSIS — Z0000 Encounter for general adult medical examination without abnormal findings: Secondary | ICD-10-CM | POA: Diagnosis not present

## 2021-12-26 DIAGNOSIS — E782 Mixed hyperlipidemia: Secondary | ICD-10-CM | POA: Diagnosis not present

## 2021-12-27 ENCOUNTER — Encounter: Payer: Self-pay | Admitting: Gastroenterology

## 2021-12-28 DIAGNOSIS — E119 Type 2 diabetes mellitus without complications: Secondary | ICD-10-CM

## 2021-12-28 HISTORY — DX: Type 2 diabetes mellitus without complications: E11.9

## 2022-01-25 ENCOUNTER — Ambulatory Visit (AMBULATORY_SURGERY_CENTER): Payer: Medicare Other | Admitting: *Deleted

## 2022-01-25 ENCOUNTER — Other Ambulatory Visit: Payer: Self-pay

## 2022-01-25 VITALS — Ht 74.5 in | Wt 220.0 lb

## 2022-01-25 DIAGNOSIS — Z86010 Personal history of colonic polyps: Secondary | ICD-10-CM

## 2022-01-25 MED ORDER — PEG 3350-KCL-NA BICARB-NACL 420 G PO SOLR: 4000.0000 mL | Freq: Once | ORAL | 0 refills | Status: AC

## 2022-01-25 NOTE — Progress Notes (Signed)

## 2022-02-08 ENCOUNTER — Ambulatory Visit (AMBULATORY_SURGERY_CENTER): Payer: Medicare Other | Admitting: Gastroenterology

## 2022-02-08 ENCOUNTER — Other Ambulatory Visit: Payer: Self-pay

## 2022-02-08 ENCOUNTER — Encounter: Payer: Self-pay | Admitting: Gastroenterology

## 2022-02-08 ENCOUNTER — Other Ambulatory Visit: Payer: Self-pay | Admitting: Gastroenterology

## 2022-02-08 VITALS — BP 130/77 | HR 59 | Temp 96.2°F | Resp 16 | Ht 75.0 in | Wt 220.0 lb

## 2022-02-08 DIAGNOSIS — K621 Rectal polyp: Secondary | ICD-10-CM

## 2022-02-08 DIAGNOSIS — D128 Benign neoplasm of rectum: Secondary | ICD-10-CM

## 2022-02-08 DIAGNOSIS — Z86010 Personal history of colonic polyps: Secondary | ICD-10-CM

## 2022-02-08 MED ORDER — SODIUM CHLORIDE 0.9 % IV SOLN: 500.0000 mL | Freq: Once | INTRAVENOUS | Status: DC

## 2022-02-08 MED ORDER — DEXTROSE 5 % IV SOLN: Freq: Once | INTRAVENOUS | Status: DC

## 2022-02-08 NOTE — Progress Notes (Signed)
HPI: ?This is a man with h/o polysp ? ?Colonsocopy 2019 three adenomas removed including one that was 1.4cm ? ? ?ROS: complete GI ROS as described in HPI, all other review negative. ? ?Constitutional:  No unintentional weight loss ? ? ?Past Medical History:  ?Diagnosis Date  ? Allergy   ? Anemia   ? Arthritis   ? Diabetes mellitus without complication ([place]) 88/4166  ? ED (erectile dysfunction)   ? Family history of cancer   ? Family history of prostate cancer   ? Genetic testing 05/02/2018  ? Multi-Cancer panel (83 genes) @ [facility] - No pathogenic mutations detected  ? History of anemia   ? due to acute blood loss from lower GI bleeds 2007, 2010, 2013  ? History of GI diverticular bleed   ? tranfused 03/ 2010, 01/ 2013/  no blood needed 09/ 2014, 2015  ? History of lower GI bleeding 08/2006  ? post colonoscopy w/ polypectomy -- treatment colonoscopy w/ endo clip at polypectomy site and 2PRBCs  ? History of pneumothorax 07/2008  ? spontaneous-- tx chest tube  ? HLD (hyperlipidemia)   ? HTN (hypertension)   ? Hx of adenomatous colonic polyps   ? Hyperplasia of prostate with lower urinary tract symptoms (LUTS)   ? Mild obstructive sleep apnea   ? 06-21-2018 per pt study done 8 yrs ago, told borderline osa , no cpap  ? Primary hypogonadism in male   ? Prostate cancer [facility]) urologist-  dr winter/  oncologist-  dr Tammi Klippel  ? first dx 04/ 2015  Stage T1c, PSA 4.94;  last prostate bx 02-25-2018  Stage T1c, Gleason 3+4, PSA 9.45, vol 120cc-- planned treatement external beam radiation  ? Wears glasses   ? ? ?Past Surgical History:  ?Procedure Laterality Date  ? COLONOSCOPY  02/08/2022  ? 2019  ? COLONOSCOPY W/ POLYPECTOMY  last one 2014  ? GOLD SEED IMPLANT N/A 06/26/2018  ? Procedure: GOLD SEED IMPLANT;  Surgeon: Ceasar Mons, MD;  Location: [facility];  Service: Urology;  Laterality: N/A;  ONLY NEEDS 30 MIN FOR ALL PROCEDURES  ? PROSTATE BIOPSY  last one 02-25-2018   dr winter office  ? SPACE  OAR INSTILLATION N/A 06/26/2018  ? Procedure: SPACE OAR INSTILLATION;  Surgeon: Ceasar Mons, MD;  Location: [facility];  Service: Urology;  Laterality: N/A;  ? TONSILLECTOMY  child  ? UMBILICAL HERNIA REPAIR  2012  ? ? ?Current Outpatient Medications  ?Medication Sig Dispense Refill  ? amLODipine (NORVASC) 5 MG tablet Take 5 mg by mouth daily.  3  ? atorvastatin (LIPITOR) 10 MG tablet Take 10 mg by mouth daily.    ? latanoprost (XALATAN) 0.005 % ophthalmic solution 1 drop at bedtime.    ? lisinopril-hydrochlorothiazide (PRINZIDE,ZESTORETIC) 20-25 MG per tablet Take 1 tablet by mouth every morning.   5  ? metformin (FORTAMET) 500 MG (OSM) 24 hr tablet Take 500 mg by mouth daily with breakfast.    ? tamsulosin (FLOMAX) 0.4 MG CAPS capsule Take 0.4 mg by mouth daily after breakfast.     ? EPINEPHrine 0.3 mg/0.3 mL IJ SOAJ injection epinephrine 0.3 mg/0.3 mL injection, auto-injector    ? finasteride (PROSCAR) 5 MG tablet Take 5 mg by mouth daily. (Patient not taking: Reported on 01/25/2022)    ? ?Current Facility-Administered Medications  ?Medication Dose Route Frequency Provider Last Rate Last Admin  ? 0.9 %  sodium chloride infusion  500 mL Intravenous Once Milus Banister, MD      ?  dextrose 5 % solution   Intravenous Once Milus Banister, MD      ? ? ?Allergies as of 02/08/2022 - Review Complete 02/08/2022  ?Allergen Reaction Noted  ? Bee venom Anaphylaxis 06/26/2015  ? Aspirin Nausea And Vomiting and Other (See Comments) 11/12/2020  ? Penicillins Itching and Rash   ? ? ?Family History  ?Problem Relation Age of Onset  ? Heart disease Mother   ? Diabetes Mother   ? Hypertension Mother   ? Cancer Father 3  ?     metastatic; unk. primary; deceased 72  ? Cancer Maternal Uncle   ?     unk. primary  ? Cancer Paternal Aunt   ?     unk. primary; died young  ? Colon cancer Paternal Uncle 53  ?     deceased 80  ? Prostate cancer Cousin 30  ?     maternal first cousin; son of uncle with  metastatic cancer  ? Prostate cancer Cousin   ?     paternal first cousin; son of unaffected paternal aunt  ? Esophageal cancer Neg Hx   ? Rectal cancer Neg Hx   ? Stomach cancer Neg Hx   ? Colon polyps Neg Hx   ? ? ?Social History  ? ?Socioeconomic History  ? Marital status: Married  ?  Spouse name: Not on file  ? Number of children: 2  ? Years of education: Not on file  ? Highest education level: Not on file  ?Occupational History  ? Occupation: HIGHWAY INSPECTOR  ?  Employer: [employer]  ?Tobacco Use  ? Smoking status: Never  ? Smokeless tobacco: Current  ?  Types: Chew  ? Tobacco comments:  ?  06-21-2018 chew tobacco since age 47 (since 68s) 27oz/week  (95 yrs)  ?Vaping Use  ? Vaping Use: Never used  ?Substance and Sexual Activity  ? Alcohol use: Yes  ?  Comment: weekend--- case of beer  ? Drug use: No  ? Sexual activity: Not on file  ?Other Topics Concern  ? Not on file  ?Social History Narrative  ? The patient is married and lives in [place].  ? ?Social Determinants of Health  ? ?Financial Resource Strain: Not on file  ?Food Insecurity: Not on file  ?Transportation Needs: Not on file  ?Physical Activity: Not on file  ?Stress: Not on file  ?Social Connections: Not on file  ?Intimate Partner Violence: Not on file  ? ? ? ?Physical Exam: ?BP 136/82   Pulse 60   Temp (!) 96.2 ?F (35.7 ?C) (Temporal)   Ht '6\' 3"'$  (1.905 m)   Wt 220 lb (99.8 kg)   SpO2 100%   BMI 27.50 kg/m?  ?Constitutional: generally well-appearing ?Psychiatric: alert and oriented x3 ?Lungs: CTA bilaterally ?Heart: no MCR ? ?Assessment and plan: ?76 y.o. male with man with h/o polyps ? ?Surveillance colonoscopy otday ? ?Care is appropriate for the ambulatory setting. ? ?Owens Loffler, MD ?[facility] ?02/08/2022, 9:53 AM ? ? ? ?

## 2022-02-08 NOTE — Progress Notes (Signed)
Called to room to assist during endoscopic procedure.  Patient ID and intended procedure confirmed with present staff. Received instructions for my participation in the procedure from the performing physician.  

## 2022-02-08 NOTE — Progress Notes (Signed)
Pt's states no medical or surgical changes since previsit or office visit.  ? ?VS DT ? ?Report to Dr Ardis Hughs re: pt CBG of 75 and pt bm murky, cloudy, no stool. Orders rec'd for d5w prior to procedure. ?

## 2022-02-08 NOTE — Progress Notes (Signed)
PT taken to PACU. Monitors in place. VSS. Report given to RN. 

## 2022-02-08 NOTE — Op Note (Signed)
[facility] ?Patient Name: [name] ?Procedure Date: 02/08/2022 9:53 AM ?MRN: [id number] ?Endoscopist: Milus Banister , MD ?Age: 76 ?Referring MD:  ?Date of Birth: Nov 06, 1946 ?Gender: Male ?Account #: [id number] ?Procedure:                Colonoscopy ?Indications:              High risk colon cancer surveillance: Personal  ?                          history of colonic polyps ?Medicines:                Monitored Anesthesia Care ?Procedure:                Pre-Anesthesia Assessment: ?                          - Prior to the procedure, a History and Physical  ?                          was performed, and patient medications and  ?                          allergies were reviewed. The patient's tolerance of  ?                          previous anesthesia was also reviewed. The risks  ?                          and benefits of the procedure and the sedation  ?                          options and risks were discussed with the patient.  ?                          All questions were answered, and informed consent  ?                          was obtained. Prior Anticoagulants: The patient has  ?                          taken no previous anticoagulant or antiplatelet  ?                          agents. ASA Grade Assessment: II - A patient with  ?                          mild systemic disease. After reviewing the risks  ?                          and benefits, the patient was deemed in  ?                          satisfactory condition to undergo the procedure. ?  After obtaining informed consent, the colonoscope  ?                          was passed under direct vision. Throughout the  ?                          procedure, the patient's blood pressure, pulse, and  ?                          oxygen saturations were monitored continuously. The  ?                          CF [id number] #[id number] was introduced through the anus  ?                          and advanced to the the cecum,  identified by  ?                          appendiceal orifice and ileocecal valve. The  ?                          colonoscopy was performed without difficulty. The  ?                          patient tolerated the procedure well. The quality  ?                          of the bowel preparation was good. The ileocecal  ?                          valve, appendiceal orifice, and rectum were  ?                          photographed. ?Scope In: 10:02:22 AM ?Scope Out: 10:16:08 AM ?Scope Withdrawal Time: 0 hours 8 minutes 54 seconds  ?Total Procedure Duration: 0 hours 13 minutes 46 seconds  ?Findings:                 A 3 mm polyp was found in the mid rectum. The polyp  ?                          was sessile. The polyp was removed with a cold  ?                          snare. Resection and retrieval were complete. ?                          Multiple small and large-mouthed diverticula were  ?                          found in the entire colon. ?                          The exam was otherwise without abnormality on  ?  direct and retroflexion views. ?Complications:            No immediate complications. Estimated blood loss:  ?                          None. ?Estimated Blood Loss:     Estimated blood loss: none. ?Impression:               - One 3 mm polyp in the mid rectum, removed with a  ?                          cold snare. Resected and retrieved. ?                          - Diverticulosis in the entire examined colon. ?                          - The examination was otherwise normal on direct  ?                          and retroflexion views. ?Recommendation:           - Patient has a contact number available for  ?                          emergencies. The signs and symptoms of potential  ?                          delayed complications were discussed with the  ?                          patient. Return to normal activities tomorrow.  ?                          Written discharge  instructions were provided to the  ?                          patient. ?                          - Resume previous diet. ?                          - Continue present medications. ?                          - Await pathology results. ?Milus Banister, MD ?02/08/2022 10:18:37 AM ?This report has been signed electronically. ?

## 2022-02-08 NOTE — Patient Instructions (Signed)
Handout on polyps and diverticulosis provided  ? ?Await pathology results.  ? ?Continue current medications.  ? ?YOU HAD AN ENDOSCOPIC PROCEDURE TODAY AT [facility]:   Refer to the procedure report that was given to you for any specific questions about what was found during the examination.  If the procedure report does not answer your questions, please call your gastroenterologist to clarify.  If you requested that your care partner not be given the details of your procedure findings, then the procedure report has been included in a sealed envelope for you to review at your convenience later. ? ?YOU SHOULD EXPECT: Some feelings of bloating in the abdomen. Passage of more gas than usual.  Walking can help get rid of the air that was put into your GI tract during the procedure and reduce the bloating. If you had a lower endoscopy (such as a colonoscopy or flexible sigmoidoscopy) you may notice spotting of blood in your stool or on the toilet paper. If you underwent a bowel prep for your procedure, you may not have a normal bowel movement for a few days. ? ?Please Note:  You might notice some irritation and congestion in your nose or some drainage.  This is from the oxygen used during your procedure.  There is no need for concern and it should clear up in a day or so. ? ?SYMPTOMS TO REPORT IMMEDIATELY: ? ?Following lower endoscopy (colonoscopy or flexible sigmoidoscopy): ? Excessive amounts of blood in the stool ? Significant tenderness or worsening of abdominal pains ? Swelling of the abdomen that is new, acute ? Fever of 100?F or higher ? ?For urgent or emergent issues, a gastroenterologist can be reached at any hour by calling [phone]. ?Do not use MyChart messaging for urgent concerns.  ? ? ?DIET:  We do recommend a small meal at first, but then you may proceed to your regular diet.  Drink plenty of fluids but you should avoid alcoholic beverages for 24 hours. ? ?ACTIVITY:  You should plan  to take it easy for the rest of today and you should NOT DRIVE or use heavy machinery until tomorrow (because of the sedation medicines used during the test).   ? ?FOLLOW UP: ?Our staff will call the number listed on your records 48-72 hours following your procedure to check on you and address any questions or concerns that you may have regarding the information given to you following your procedure. If we do not reach you, we will leave a message.  We will attempt to reach you two times.  During this call, we will ask if you have developed any symptoms of COVID 19. If you develop any symptoms (ie: fever, flu-like symptoms, shortness of breath, cough etc.) before then, please call [phone].  If you test positive for Covid 19 in the 2 weeks post procedure, please call and report this information to [place].   ? ?If any biopsies were taken you will be contacted by phone or by letter within the next 1-3 weeks.  Please call us at [phone] if you have not heard about the biopsies in 3 weeks.  ? ? ?SIGNATURES/CONFIDENTIALITY: ?You and/or your care partner have signed paperwork which will be entered into your electronic medical record.  These signatures attest to the fact that that the information above on your After Visit Summary has been reviewed and is understood.  Full responsibility of the confidentiality of this discharge information lies with you and/or your care-partner. ? ? ?

## 2022-02-10 ENCOUNTER — Telehealth: Payer: Self-pay

## 2022-02-10 NOTE — Telephone Encounter (Signed)
?  Follow up Call- ? ?Call back number 02/08/2022  ?Post procedure Call Back phone  # [phone]  ?Permission to leave phone message Yes  ?Some recent data might be hidden  ?  ? ?Patient questions: ? ?Do you have a fever, pain , or abdominal swelling? No. ?Pain Score  0 * ? ?Have you tolerated food without any problems? Yes.   ? ?Have you been able to return to your normal activities? Yes.   ? ?Do you have any questions about your discharge instructions: ?Diet   No. ?Medications  No. ?Follow up visit  No. ? ?Do you have questions or concerns about your Care? No. ? ?Actions: ?* If pain score is 4 or above: ?No action needed, pain <4. ? ? ?

## 2022-02-14 ENCOUNTER — Encounter: Payer: Self-pay | Admitting: Gastroenterology

## 2022-04-18 DIAGNOSIS — H401131 Primary open-angle glaucoma, bilateral, mild stage: Secondary | ICD-10-CM | POA: Diagnosis not present

## 2022-05-16 DIAGNOSIS — C61 Malignant neoplasm of prostate: Secondary | ICD-10-CM | POA: Diagnosis not present

## 2022-06-20 DIAGNOSIS — N3281 Overactive bladder: Secondary | ICD-10-CM | POA: Diagnosis not present

## 2022-06-20 DIAGNOSIS — E782 Mixed hyperlipidemia: Secondary | ICD-10-CM | POA: Diagnosis not present

## 2022-06-20 DIAGNOSIS — I10 Essential (primary) hypertension: Secondary | ICD-10-CM | POA: Diagnosis not present

## 2022-06-20 DIAGNOSIS — E1169 Type 2 diabetes mellitus with other specified complication: Secondary | ICD-10-CM | POA: Diagnosis not present

## 2022-06-20 DIAGNOSIS — Z7984 Long term (current) use of oral hypoglycemic drugs: Secondary | ICD-10-CM | POA: Diagnosis not present

## 2022-07-25 DIAGNOSIS — H401131 Primary open-angle glaucoma, bilateral, mild stage: Secondary | ICD-10-CM | POA: Diagnosis not present

## 2022-08-04 DIAGNOSIS — R310 Gross hematuria: Secondary | ICD-10-CM | POA: Diagnosis not present

## 2022-08-25 DIAGNOSIS — R310 Gross hematuria: Secondary | ICD-10-CM | POA: Diagnosis not present

## 2022-09-26 DIAGNOSIS — I82220 Acute embolism and thrombosis of inferior vena cava: Secondary | ICD-10-CM | POA: Diagnosis not present

## 2022-09-26 DIAGNOSIS — R310 Gross hematuria: Secondary | ICD-10-CM | POA: Diagnosis not present

## 2022-09-26 DIAGNOSIS — N2889 Other specified disorders of kidney and ureter: Secondary | ICD-10-CM | POA: Diagnosis not present

## 2022-09-29 DIAGNOSIS — N2889 Other specified disorders of kidney and ureter: Secondary | ICD-10-CM | POA: Diagnosis not present

## 2022-09-29 DIAGNOSIS — J984 Other disorders of lung: Secondary | ICD-10-CM | POA: Diagnosis not present

## 2022-09-30 DIAGNOSIS — N4232 Atypical small acinar proliferation of prostate: Secondary | ICD-10-CM | POA: Diagnosis not present

## 2022-09-30 DIAGNOSIS — C61 Malignant neoplasm of prostate: Secondary | ICD-10-CM | POA: Diagnosis not present

## 2022-10-18 DIAGNOSIS — I517 Cardiomegaly: Secondary | ICD-10-CM | POA: Diagnosis not present

## 2022-10-23 DIAGNOSIS — Z01818 Encounter for other preprocedural examination: Secondary | ICD-10-CM | POA: Diagnosis not present

## 2022-10-23 DIAGNOSIS — E119 Type 2 diabetes mellitus without complications: Secondary | ICD-10-CM | POA: Diagnosis not present

## 2022-10-23 DIAGNOSIS — Z794 Long term (current) use of insulin: Secondary | ICD-10-CM | POA: Diagnosis not present

## 2022-10-23 DIAGNOSIS — Z01812 Encounter for preprocedural laboratory examination: Secondary | ICD-10-CM | POA: Diagnosis not present

## 2022-10-23 DIAGNOSIS — N2889 Other specified disorders of kidney and ureter: Secondary | ICD-10-CM | POA: Diagnosis not present

## 2022-10-23 DIAGNOSIS — I82220 Acute embolism and thrombosis of inferior vena cava: Secondary | ICD-10-CM | POA: Diagnosis not present

## 2022-10-23 DIAGNOSIS — Z01810 Encounter for preprocedural cardiovascular examination: Secondary | ICD-10-CM | POA: Diagnosis not present

## 2022-10-23 DIAGNOSIS — D49511 Neoplasm of unspecified behavior of right kidney: Secondary | ICD-10-CM | POA: Diagnosis not present

## 2022-10-25 DIAGNOSIS — H401131 Primary open-angle glaucoma, bilateral, mild stage: Secondary | ICD-10-CM | POA: Diagnosis not present

## 2022-10-27 DIAGNOSIS — I97190 Other postprocedural cardiac functional disturbances following cardiac surgery: Secondary | ICD-10-CM | POA: Diagnosis not present

## 2022-10-27 DIAGNOSIS — R140 Abdominal distension (gaseous): Secondary | ICD-10-CM | POA: Diagnosis not present

## 2022-10-27 DIAGNOSIS — J9382 Other air leak: Secondary | ICD-10-CM | POA: Diagnosis not present

## 2022-10-27 DIAGNOSIS — R000 Tachycardia, unspecified: Secondary | ICD-10-CM | POA: Diagnosis not present

## 2022-10-27 DIAGNOSIS — I071 Rheumatic tricuspid insufficiency: Secondary | ICD-10-CM | POA: Diagnosis not present

## 2022-10-27 DIAGNOSIS — D62 Acute posthemorrhagic anemia: Secondary | ICD-10-CM | POA: Diagnosis not present

## 2022-10-27 DIAGNOSIS — N400 Enlarged prostate without lower urinary tract symptoms: Secondary | ICD-10-CM | POA: Diagnosis not present

## 2022-10-27 DIAGNOSIS — Z4659 Encounter for fitting and adjustment of other gastrointestinal appliance and device: Secondary | ICD-10-CM | POA: Diagnosis not present

## 2022-10-27 DIAGNOSIS — G8918 Other acute postprocedural pain: Secondary | ICD-10-CM | POA: Diagnosis not present

## 2022-10-27 DIAGNOSIS — I471 Supraventricular tachycardia, unspecified: Secondary | ICD-10-CM | POA: Diagnosis not present

## 2022-10-27 DIAGNOSIS — N2889 Other specified disorders of kidney and ureter: Secondary | ICD-10-CM | POA: Diagnosis not present

## 2022-10-27 DIAGNOSIS — C61 Malignant neoplasm of prostate: Secondary | ICD-10-CM | POA: Diagnosis not present

## 2022-10-27 DIAGNOSIS — K567 Ileus, unspecified: Secondary | ICD-10-CM | POA: Diagnosis not present

## 2022-10-27 DIAGNOSIS — E119 Type 2 diabetes mellitus without complications: Secondary | ICD-10-CM | POA: Diagnosis not present

## 2022-10-27 DIAGNOSIS — E785 Hyperlipidemia, unspecified: Secondary | ICD-10-CM | POA: Diagnosis not present

## 2022-10-27 DIAGNOSIS — J9811 Atelectasis: Secondary | ICD-10-CM | POA: Diagnosis not present

## 2022-10-27 DIAGNOSIS — I82220 Acute embolism and thrombosis of inferior vena cava: Secondary | ICD-10-CM | POA: Diagnosis not present

## 2022-10-27 DIAGNOSIS — Z7984 Long term (current) use of oral hypoglycemic drugs: Secondary | ICD-10-CM | POA: Diagnosis not present

## 2022-10-27 DIAGNOSIS — I4891 Unspecified atrial fibrillation: Secondary | ICD-10-CM | POA: Diagnosis not present

## 2022-10-27 DIAGNOSIS — I10 Essential (primary) hypertension: Secondary | ICD-10-CM | POA: Diagnosis not present

## 2022-10-27 DIAGNOSIS — C641 Malignant neoplasm of right kidney, except renal pelvis: Secondary | ICD-10-CM | POA: Diagnosis not present

## 2022-10-27 DIAGNOSIS — Z79899 Other long term (current) drug therapy: Secondary | ICD-10-CM | POA: Diagnosis not present

## 2022-10-27 DIAGNOSIS — K9189 Other postprocedural complications and disorders of digestive system: Secondary | ICD-10-CM | POA: Diagnosis not present

## 2022-10-30 DIAGNOSIS — K9189 Other postprocedural complications and disorders of digestive system: Secondary | ICD-10-CM | POA: Diagnosis not present

## 2022-10-30 DIAGNOSIS — K567 Ileus, unspecified: Secondary | ICD-10-CM | POA: Diagnosis not present

## 2022-11-10 ENCOUNTER — Other Ambulatory Visit: Payer: Self-pay

## 2022-11-10 ENCOUNTER — Emergency Department (HOSPITAL_COMMUNITY)
Admission: EM | Admit: 2022-11-10 | Discharge: 2022-11-11 | Disposition: A | Discharge status: Home / Self Care | Payer: Medicare Other | Attending: Emergency Medicine | Admitting: Emergency Medicine

## 2022-11-10 ENCOUNTER — Encounter (HOSPITAL_COMMUNITY): Payer: Self-pay | Admitting: Emergency Medicine

## 2022-11-10 DIAGNOSIS — Z7984 Long term (current) use of oral hypoglycemic drugs: Secondary | ICD-10-CM | POA: Diagnosis not present

## 2022-11-10 DIAGNOSIS — Z4801 Encounter for change or removal of surgical wound dressing: Secondary | ICD-10-CM | POA: Diagnosis not present

## 2022-11-10 DIAGNOSIS — Z79899 Other long term (current) drug therapy: Secondary | ICD-10-CM | POA: Insufficient documentation

## 2022-11-10 DIAGNOSIS — Z9889 Other specified postprocedural states: Secondary | ICD-10-CM

## 2022-11-10 NOTE — ED Triage Notes (Signed)
The patient recently had abdominal surgery. Today, as instructed, the patient's wife began to remove the tape on the patient's abdomin. Upon removal she has noticed drainage from one of the surgical sites. Patient presented with a saturated ADB pad.

## 2022-11-10 NOTE — ED Provider Notes (Signed)
[facility] DEPT Provider Note   CSN: [id number] Arrival date & time: 11/10/22  2049     History  Chief Complaint  Patient presents with   Post-op Problem    [name] is a 76 y.o. male.  76 yo M with a chief complaint of drainage from surgical wound.  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  He was discharged 48 hours ago.  Per their instructions there is post to change his dressing today.  After which his wife noted some serous drainage from one of his inferior wounds where his JP drain had been in place.  She tried to call be surgical office and was unable to get in touch with the person after multiple calls throughout the day.  Eventually decided to bring him to the emergency department for evaluation.  He denies any complaints.  Denies abdominal pain denies fevers denies nausea or vomiting.        Home Medications Prior to Admission medications   Medication Sig Start Date End Date Taking? Authorizing Provider  amLODipine (NORVASC) 5 MG tablet Take 5 mg by mouth daily. 07/12/18   [provider]  atorvastatin (LIPITOR) 10 MG tablet Take 10 mg by mouth daily. 12/26/21   [provider]  EPINEPHrine 0.3 mg/0.3 mL IJ SOAJ injection epinephrine 0.3 mg/0.3 mL injection, auto-injector    [provider]  finasteride (PROSCAR) 5 MG tablet Take 5 mg by mouth daily. Patient not taking: Reported on 01/25/2022 03/15/20   [provider]  latanoprost (XALATAN) 0.005 % ophthalmic solution 1 drop at bedtime. 01/23/22   [provider]  lisinopril-hydrochlorothiazide (PRINZIDE,ZESTORETIC) 20-25 MG per tablet Take 1 tablet by mouth every morning.  05/30/15   [provider]  metformin (FORTAMET) 500 MG (OSM) 24 hr tablet Take 500 mg by mouth daily with breakfast.    [provider]  tamsulosin (FLOMAX) 0.4  MG CAPS capsule Take 0.4 mg by mouth daily after breakfast.     [provider]      Allergies    Bee venom, Aspirin, and Penicillins    Review of Systems   Review of Systems  Physical Exam Updated Vital Signs BP 127/74   Pulse 78   Temp 98.2 F (36.8 C)   Resp 18   SpO2 99%  Physical Exam Vitals and nursing note reviewed.  Constitutional:      Appearance: He is well-developed.  HENT:     Head: Normocephalic and atraumatic.  Eyes:     Pupils: Pupils are equal, round, and reactive to light.  Neck:     Vascular: No JVD.  Cardiovascular:     Rate and Rhythm: Normal rate and regular rhythm.     Heart sounds: No murmur heard.    No friction rub. No gallop.  Pulmonary:     Effort: No respiratory distress.     Breath sounds: No wheezing.  Abdominal:     General: There is no distension.     Tenderness: There is no abdominal tenderness. There is no guarding or rebound.     Comments: Large midline abdominal surgical incision.  Clean dry and intact.  He has a open lesion about the size of an eraser to the right lower quadrant.  I do not appreciate any drainage.  No fluid wave.  No abdominal tenderness.  Musculoskeletal:        General: Normal  range of motion.     Cervical back: Normal range of motion and neck supple.  Skin:    Coloration: Skin is not pale.     Findings: No rash.  Neurological:     Mental Status: He is alert and oriented to person, place, and time.  Psychiatric:        Behavior: Behavior normal.     ED Results / Procedures / Treatments   Labs (all labs ordered are listed, but only abnormal results are displayed) Labs Reviewed - No data to display  EKG None  Radiology No results found.  Procedures Procedures    Medications Ordered in ED Medications - No data to display  ED Course/ Medical Decision Making/ A&P                           Medical Decision Making  76 yo M with a chief complaints of drainage from surgical wound.  The  patient was just admitted to the [facility] system he had a radical right nephrectomy as well as had an IVC thrombus removed.  He was discharged about 48 hours ago and following their discharge paperwork and change his dressing today.  The wife then noticed some serous drainage from the site of the JP drain.  I suspect this is likely normal postoperative.  The wife tells me that he did have fluid collection after the JP drain was removed.  He is well-appearing and has no complaints.  I did offer lab work and CT imaging.  After shared decision making at bedside electing for discharge home and will follow-up with their surgeon in the office.  11:58 PM:  I have discussed the diagnosis/risks/treatment options with the patient and family.  Evaluation and diagnostic testing in the emergency department does not suggest an emergent condition requiring admission or immediate intervention beyond what has been performed at this time.  They will follow up with PCP. We also discussed returning to the ED immediately if new or worsening sx occur. We discussed the sx which are most concerning (e.g., sudden worsening pain, fever, inability to tolerate by mouth) that necessitate immediate return. Medications administered to the patient during their visit and any new prescriptions provided to the patient are listed below.  Medications given during this visit Medications - No data to display   The patient appears reasonably screen and/or stabilized for discharge and I doubt any other medical condition or other [facility] requiring further screening, evaluation, or treatment in the ED at this time prior to discharge.          Final Clinical Impression(s) / ED Diagnoses Final diagnoses:  Post-operative state    Rx / DC Orders ED Discharge Orders     None         Deno Etienne, DO 11/10/22 2358

## 2022-11-10 NOTE — Discharge Instructions (Signed)
Please follow-up with your surgeon in the office.  Please return for abdominal pain fever

## 2022-11-11 NOTE — ED Notes (Signed)
Pt ambulated from ed with steady gait . Verbalized understanding of discharge instructions. Family to drive home.

## 2022-11-15 DIAGNOSIS — Z888 Allergy status to other drugs, medicaments and biological substances status: Secondary | ICD-10-CM | POA: Diagnosis not present

## 2022-11-15 DIAGNOSIS — Z886 Allergy status to analgesic agent status: Secondary | ICD-10-CM | POA: Diagnosis not present

## 2022-11-15 DIAGNOSIS — Z880 Allergy status to penicillin: Secondary | ICD-10-CM | POA: Diagnosis not present

## 2022-11-15 DIAGNOSIS — C641 Malignant neoplasm of right kidney, except renal pelvis: Secondary | ICD-10-CM | POA: Diagnosis not present

## 2022-12-05 ENCOUNTER — Inpatient Hospital Stay: Payer: BC Managed Care – PPO

## 2022-12-05 ENCOUNTER — Inpatient Hospital Stay: Payer: BC Managed Care – PPO | Admitting: Internal Medicine

## 2023-01-25 DIAGNOSIS — H401131 Primary open-angle glaucoma, bilateral, mild stage: Secondary | ICD-10-CM | POA: Diagnosis not present

## 2023-02-09 DIAGNOSIS — I10 Essential (primary) hypertension: Secondary | ICD-10-CM | POA: Diagnosis not present

## 2023-02-09 DIAGNOSIS — E119 Type 2 diabetes mellitus without complications: Secondary | ICD-10-CM | POA: Diagnosis not present

## 2023-02-09 DIAGNOSIS — Z0000 Encounter for general adult medical examination without abnormal findings: Secondary | ICD-10-CM | POA: Diagnosis not present

## 2023-02-09 DIAGNOSIS — N3281 Overactive bladder: Secondary | ICD-10-CM | POA: Diagnosis not present

## 2023-02-09 DIAGNOSIS — Z23 Encounter for immunization: Secondary | ICD-10-CM | POA: Diagnosis not present

## 2023-02-09 DIAGNOSIS — E782 Mixed hyperlipidemia: Secondary | ICD-10-CM | POA: Diagnosis not present

## 2023-02-09 DIAGNOSIS — Z1331 Encounter for screening for depression: Secondary | ICD-10-CM | POA: Diagnosis not present

## 2023-02-09 DIAGNOSIS — E1169 Type 2 diabetes mellitus with other specified complication: Secondary | ICD-10-CM | POA: Diagnosis not present

## 2023-03-08 DIAGNOSIS — Z08 Encounter for follow-up examination after completed treatment for malignant neoplasm: Secondary | ICD-10-CM | POA: Diagnosis not present

## 2023-03-08 DIAGNOSIS — Z905 Acquired absence of kidney: Secondary | ICD-10-CM | POA: Diagnosis not present

## 2023-03-08 DIAGNOSIS — C641 Malignant neoplasm of right kidney, except renal pelvis: Secondary | ICD-10-CM | POA: Diagnosis not present

## 2023-03-15 DIAGNOSIS — Z85528 Personal history of other malignant neoplasm of kidney: Secondary | ICD-10-CM | POA: Diagnosis not present

## 2023-03-15 DIAGNOSIS — N2889 Other specified disorders of kidney and ureter: Secondary | ICD-10-CM | POA: Diagnosis not present

## 2023-03-15 DIAGNOSIS — Z08 Encounter for follow-up examination after completed treatment for malignant neoplasm: Secondary | ICD-10-CM | POA: Diagnosis not present

## 2023-03-15 DIAGNOSIS — C641 Malignant neoplasm of right kidney, except renal pelvis: Secondary | ICD-10-CM | POA: Diagnosis not present

## 2023-05-24 DIAGNOSIS — H401131 Primary open-angle glaucoma, bilateral, mild stage: Secondary | ICD-10-CM | POA: Diagnosis not present

## 2023-05-24 DIAGNOSIS — E119 Type 2 diabetes mellitus without complications: Secondary | ICD-10-CM | POA: Diagnosis not present

## 2023-06-08 DIAGNOSIS — C61 Malignant neoplasm of prostate: Secondary | ICD-10-CM | POA: Diagnosis not present

## 2023-06-08 DIAGNOSIS — C641 Malignant neoplasm of right kidney, except renal pelvis: Secondary | ICD-10-CM | POA: Diagnosis not present

## 2023-09-13 DIAGNOSIS — C641 Malignant neoplasm of right kidney, except renal pelvis: Secondary | ICD-10-CM | POA: Diagnosis not present

## 2023-09-13 DIAGNOSIS — I700 Atherosclerosis of aorta: Secondary | ICD-10-CM | POA: Diagnosis not present

## 2023-09-13 DIAGNOSIS — C61 Malignant neoplasm of prostate: Secondary | ICD-10-CM | POA: Diagnosis not present

## 2023-09-13 DIAGNOSIS — R932 Abnormal findings on diagnostic imaging of liver and biliary tract: Secondary | ICD-10-CM | POA: Diagnosis not present

## 2023-09-13 DIAGNOSIS — R918 Other nonspecific abnormal finding of lung field: Secondary | ICD-10-CM | POA: Diagnosis not present

## 2023-09-13 DIAGNOSIS — Z905 Acquired absence of kidney: Secondary | ICD-10-CM | POA: Diagnosis not present

## 2023-10-08 DIAGNOSIS — C641 Malignant neoplasm of right kidney, except renal pelvis: Secondary | ICD-10-CM | POA: Insufficient documentation

## 2023-10-08 NOTE — Progress Notes (Unsigned)
[facility] CONSULT NOTE  Patient Care Team: [name], MD as PCP - General (Family Medicine)  ASSESSMENT & PLAN:  [name] is a 77 y.o.male with history of DM, HTN, HLD, and prostate cancer (T1c) s/p EBRT alone in 2019 and Right papillary RCC s/p nephrectomy in 10/2022 referred to Medical Oncology Clinic for [facility].  Initial diagnosis: 10/2022. pT3cN0M0 G3 right papillary RCC, type 2  No problem-specific Assessment & Plan notes found for this encounter.   No orders of the defined types were placed in this encounter.   The total time spent in the appointment was {CHL ONC TIME VISIT - EAVWU:[id number]} encounter with patients including review of chart and various tests results, discussions about plan of care and coordination of care plan   All questions were answered. The patient knows to call the clinic with any problems, questions or concerns. No barriers to learning was detected.  [name], MD 11/11/202410:34 PM  CHIEF COMPLAINTS/PURPOSE OF CONSULTATION:  ***  HISTORY OF PRESENTING ILLNESS:  [name] 77 y.o. male is here because of *** I have reviewed his chart and materials related to his cancer extensively and collaborated history with the patient. Summary of oncologic history is as follows: Oncology History  Malignant neoplasm of prostate (HCC)  04/01/2018 Initial Diagnosis   Malignant neoplasm of prostate (HCC)   04/30/2018 Genetic Testing   Multi-Cancer panel (83 genes) @ [facility] - No pathogenic mutations detected Variants of Uncertain Significance in ATM, SDHA and TSC2  Genes Analyzed: 83 genes on [facility]'s Multi-Cancer panel (ALK, APC, ATM, AXIN2, BAP1, BARD1, BLM, BMPR1A, BRCA1, BRCA2, BRIP1, CASR, CDC73, CDH1, CDK4, CDKN1B, CDKN1C, CDKN2A, CEBPA, CHEK2, CTNNA1, DICER1, DIS3L2, EGFR, EPCAM, FH, FLCN, GATA2, GPC3, GREM1, HOXB13, HRAS, KIT, MAX, MEN1, MET, MITF, MLH1, MSH2, MSH3, MSH6, MUTYH, NBN, NF1, NF2, NTHL1, PALB2, PDGFRA, PHOX2B, PMS2, POLD1,  POLE, POT1, PRKAR1A, PTCH1, PTEN, RAD50, RAD51C, RAD51D, RB1, RECQL4, RET, RUNX1, SDHA, SDHAF2, SDHB, SDHC, SDHD, SMAD4, SMARCA4, SMARCB1, SMARCE1, STK11, SUFU, TERC, TERT, TMEM127, TP53, TSC1, TSC2, VHL, WRN, WT1).  UPDATE: ATM [id number] VUS was amended to "Likely Benign" due to a re-review of the evidence in light of new variant interpretation guidelines and/or new information.     MEDICAL HISTORY:  Past Medical History:  Diagnosis Date   Allergy    Anemia    Arthritis    Diabetes mellitus without complication (HCC) 12/2021   ED (erectile dysfunction)    Family history of cancer    Family history of prostate cancer    Genetic testing 05/02/2018   Multi-Cancer panel (83 genes) @ [facility] - No pathogenic mutations detected   History of anemia    due to acute blood loss from lower GI bleeds 2007, 2010, 2013   History of GI diverticular bleed    tranfused 03/ 2010, 01/ 2013/  no blood needed 09/ 2014, 2015   History of lower GI bleeding 08/2006   post colonoscopy w/ polypectomy -- treatment colonoscopy w/ endo clip at polypectomy site and 2PRBCs   History of pneumothorax 07/2008   spontaneous-- tx chest tube   HLD (hyperlipidemia)    HTN (hypertension)    Hx of adenomatous colonic polyps    Hyperplasia of prostate with lower urinary tract symptoms (LUTS)    Mild obstructive sleep apnea    06-21-2018 per pt study done 8 yrs ago, told borderline osa , no cpap   Primary hypogonadism in male    Prostate cancer [facility]) urologist-  dr winter/  oncologist-  dr Kathrynn Running   first dx 04/ 2015  Stage T1c, PSA 4.94;  last prostate bx 02-25-2018  Stage T1c, Gleason 3+4, PSA 9.45, vol 120cc-- planned treatement external beam radiation   Wears glasses     SURGICAL HISTORY: Past Surgical History:  Procedure Laterality Date   COLONOSCOPY  02/08/2022   2019   COLONOSCOPY W/ POLYPECTOMY  last one 2014   GOLD SEED IMPLANT N/A 06/26/2018   Procedure: GOLD SEED IMPLANT;  Surgeon: Rene Paci, MD;  Location: [facility];  Service: Urology;  Laterality: N/A;  ONLY NEEDS 30 MIN FOR ALL PROCEDURES   PROSTATE BIOPSY  last one 02-25-2018   dr winter office   SPACE OAR INSTILLATION N/A 06/26/2018   Procedure: SPACE OAR INSTILLATION;  Surgeon: Rene Paci, MD;  Location: [facility];  Service: Urology;  Laterality: N/A;   TONSILLECTOMY  child   UMBILICAL HERNIA REPAIR  2012    SOCIAL HISTORY: Social History   Socioeconomic History   Marital status: Married    Spouse name: Not on file   Number of children: 2   Years of education: Not on file   Highest education level: Not on file  Occupational History   Occupation: HIGHWAY INSPECTOR    Employer: [employer]  Tobacco Use   Smoking status: Never   Smokeless tobacco: Current    Types: Chew   Tobacco comments:    06-21-2018 chew tobacco since age 86 (since 3s) 27oz/week  (60 yrs)  Vaping Use   Vaping status: Never Used  Substance and Sexual Activity   Alcohol use: Yes    Comment: weekend--- case of beer   Drug use: No   Sexual activity: Not on file  Other Topics Concern   Not on file  Social History Narrative   The patient is married and lives in [place].   Social Determinants of Health   Financial Resource Strain: Not on file  Food Insecurity: Low Risk  (10/28/2022)   Received from [facility]   Hunger Vital Sign    Worried About Running Out of Food in the Last Year: Never true    Within the past 12 months, the food you bought just didn't last and you didn't have money to get more: Not on file  Transportation Needs: Not on file (10/28/2022)  Physical Activity: Not on file  Stress: Not on file  Social Connections: Not on file  Intimate Partner Violence: Low Risk  (10/28/2022)   Received from [facility] visits prior to 01/27/2023., [facility] visits prior to 01/27/2023.   Safety    How often does anyone,  including family and friends, physically hurt you?: Never    How often does anyone, including family and friends, insult or talk down to you?: Never    How often does anyone, including family and friends, threaten you with harm?: Never    How often does anyone, including family and friends, scream or curse at you?: Never    FAMILY HISTORY: Family History  Problem Relation Age of Onset   Heart disease Mother    Diabetes Mother    Hypertension Mother    Cancer Father 4       metastatic; unk. primary; deceased 99   Cancer Maternal Uncle        unk. primary   Cancer Paternal Aunt        unk. primary; died young   Colon cancer Paternal Uncle  58       deceased 8   Prostate cancer Cousin 47       maternal first cousin; son of uncle with metastatic cancer   Prostate cancer Cousin        paternal first cousin; son of unaffected paternal aunt   Esophageal cancer Neg Hx    Rectal cancer Neg Hx    Stomach cancer Neg Hx    Colon polyps Neg Hx     ALLERGIES:  is allergic to bee venom, aspirin, and penicillins.  MEDICATIONS:  Current Outpatient Medications  Medication Sig Dispense Refill   amLODipine (NORVASC) 5 MG tablet Take 5 mg by mouth daily.  3   atorvastatin (LIPITOR) 10 MG tablet Take 10 mg by mouth daily.     EPINEPHrine 0.3 mg/0.3 mL IJ SOAJ injection epinephrine 0.3 mg/0.3 mL injection, auto-injector     finasteride (PROSCAR) 5 MG tablet Take 5 mg by mouth daily. (Patient not taking: Reported on 01/25/2022)     latanoprost (XALATAN) 0.005 % ophthalmic solution 1 drop at bedtime.     lisinopril-hydrochlorothiazide (PRINZIDE,ZESTORETIC) 20-25 MG per tablet Take 1 tablet by mouth every morning.   5   metformin (FORTAMET) 500 MG (OSM) 24 hr tablet Take 500 mg by mouth daily with breakfast.     tamsulosin (FLOMAX) 0.4 MG CAPS capsule Take 0.4 mg by mouth daily after breakfast.      No current facility-administered medications for this visit.    REVIEW OF SYSTEMS:    Constitutional: Denies fevers, weight loss or abnormal night sweats Eyes: Denies blurriness of vision, double vision Mouth: Denies mucositis or sore throat Respiratory: Denies cough, shortness of breath or wheezes Cardiovascular: Denies palpitation, chest discomfort or lower extremity swelling Gastrointestinal:  Denies nausea, vomiting, abdominal pain, diarrhea, constipation or change in bowel habits GU: Denies any dysuria, hematuria, hesitancy Skin: Denies abnormal skin rashes Lymphatics: Denies new lymphadenopathy or easy bruising Neurological: Denies numbness, tingling or new weaknesses Behavioral/Psych: Mood is stable, no new changes  All other systems were reviewed with the patient and are negative.  PHYSICAL EXAMINATION: ECOG PERFORMANCE STATUS: {CHL ONC ECOG [phone]}  There were no vitals filed for this visit. There were no vitals filed for this visit.  GENERAL: alert, no distress and comfortable SKIN: skin color is normal, no jaundice, rashes or significant lesions EYES: sclera clear OROPHARYNX: no exudate, no erythema NECK: supple LYMPH:  no palpable lymphadenopathy in the cervical, axillary regions LUNGS: Effort normal, no respiratory distress.  Clear to auscultation bilaterally HEART: regular rate & rhythm and no lower extremity edema ABDOMEN: soft, non-tender and nondistended Musculoskeletal: no point tenderness NEURO: no focal motor/sensory deficits  LABORATORY DATA:  I have reviewed the data as listed Lab Results  Component Value Date   WBC 10.0 04/16/2020   HGB 11.8 (L) 04/16/2020   HCT 37.0 (L) 04/16/2020   MCV 85.3 04/16/2020   PLT 204 04/16/2020   No results for input(s): "NA", "K", "CL", "CO2", "GLUCOSE", "BUN", "CREATININE", "CALCIUM", "GFRNONAA", "GFRAA", "PROT", "ALBUMIN", "AST", "ALT", "ALKPHOS", "BILITOT", "BILIDIR", "IBILI" in the last 8760 hours.  RADIOGRAPHIC STUDIES: I have personally reviewed the radiological images as listed and agreed  with the findings in the report. No results found.

## 2023-10-09 ENCOUNTER — Inpatient Hospital Stay: Payer: Medicare Other

## 2023-10-09 VITALS — BP 119/74 | HR 64 | Temp 97.7°F | Resp 18 | Wt 228.2 lb

## 2023-10-09 DIAGNOSIS — Z8546 Personal history of malignant neoplasm of prostate: Secondary | ICD-10-CM | POA: Diagnosis not present

## 2023-10-09 DIAGNOSIS — D509 Iron deficiency anemia, unspecified: Secondary | ICD-10-CM | POA: Diagnosis not present

## 2023-10-09 DIAGNOSIS — R319 Hematuria, unspecified: Secondary | ICD-10-CM | POA: Diagnosis not present

## 2023-10-09 DIAGNOSIS — Z905 Acquired absence of kidney: Secondary | ICD-10-CM | POA: Diagnosis not present

## 2023-10-09 DIAGNOSIS — C641 Malignant neoplasm of right kidney, except renal pelvis: Secondary | ICD-10-CM

## 2023-10-09 DIAGNOSIS — Z85528 Personal history of other malignant neoplasm of kidney: Secondary | ICD-10-CM | POA: Diagnosis not present

## 2023-10-09 DIAGNOSIS — D649 Anemia, unspecified: Secondary | ICD-10-CM | POA: Insufficient documentation

## 2023-10-09 LAB — CBC WITH DIFFERENTIAL (CANCER CENTER ONLY)
Abs Immature Granulocytes: 0.03 10*3/uL (ref 0.00–0.07)
Basophils Absolute: 0 10*3/uL (ref 0.0–0.1)
Basophils Relative: 0 %
Eosinophils Absolute: 0.1 10*3/uL (ref 0.0–0.5)
Eosinophils Relative: 2 %
HCT: 40.8 % (ref 39.0–52.0)
Hemoglobin: 13.4 g/dL (ref 13.0–17.0)
Immature Granulocytes: 0 %
Lymphocytes Relative: 23 %
Lymphs Abs: 1.5 10*3/uL (ref 0.7–4.0)
MCH: 27.4 pg (ref 26.0–34.0)
MCHC: 32.8 g/dL (ref 30.0–36.0)
MCV: 83.4 fL (ref 80.0–100.0)
Monocytes Absolute: 0.7 10*3/uL (ref 0.1–1.0)
Monocytes Relative: 10 %
Neutro Abs: 4.4 10*3/uL (ref 1.7–7.7)
Neutrophils Relative %: 65 %
Platelet Count: 221 10*3/uL (ref 150–400)
RBC: 4.89 MIL/uL (ref 4.22–5.81)
RDW: 15.5 % (ref 11.5–15.5)
WBC Count: 6.7 10*3/uL (ref 4.0–10.5)
nRBC: 0 % (ref 0.0–0.2)

## 2023-10-09 LAB — URINALYSIS, COMPLETE (UACMP) WITH MICROSCOPIC
Bilirubin Urine: NEGATIVE
Glucose, UA: NEGATIVE mg/dL
Ketones, ur: NEGATIVE mg/dL
Leukocytes,Ua: NEGATIVE
Nitrite: NEGATIVE
Protein, ur: NEGATIVE mg/dL
Specific Gravity, Urine: 1.016 (ref 1.005–1.030)
pH: 6 (ref 5.0–8.0)

## 2023-10-09 LAB — CMP (CANCER CENTER ONLY)
ALT: 38 U/L (ref 0–44)
AST: 22 U/L (ref 15–41)
Albumin: 4.4 g/dL (ref 3.5–5.0)
Alkaline Phosphatase: 82 U/L (ref 38–126)
Anion gap: 5 (ref 5–15)
BUN: 20 mg/dL (ref 8–23)
CO2: 30 mmol/L (ref 22–32)
Calcium: 10.2 mg/dL (ref 8.9–10.3)
Chloride: 103 mmol/L (ref 98–111)
Creatinine: 1.53 mg/dL — ABNORMAL HIGH (ref 0.61–1.24)
GFR, Estimated: 47 mL/min — ABNORMAL LOW (ref >60)
Glucose, Bld: 107 mg/dL — ABNORMAL HIGH (ref 70–99)
Potassium: 4 mmol/L (ref 3.5–5.1)
Sodium: 138 mmol/L (ref 135–145)
Total Bilirubin: 0.4 mg/dL (ref <1.2)
Total Protein: 8.3 g/dL — ABNORMAL HIGH (ref 6.5–8.1)

## 2023-10-09 LAB — FERRITIN: Ferritin: 49 ng/mL (ref 24–336)

## 2023-10-09 LAB — LACTATE DEHYDROGENASE: LDH: 120 U/L (ref 98–192)

## 2023-10-16 ENCOUNTER — Ambulatory Visit (HOSPITAL_COMMUNITY)
Admission: RE | Admit: 2023-10-16 | Discharge: 2023-10-16 | Disposition: A | Discharge status: Home / Self Care | Payer: Medicare Other | Source: Ambulatory Visit

## 2023-10-16 DIAGNOSIS — C641 Malignant neoplasm of right kidney, except renal pelvis: Secondary | ICD-10-CM

## 2023-10-16 DIAGNOSIS — R1900 Intra-abdominal and pelvic swelling, mass and lump, unspecified site: Secondary | ICD-10-CM | POA: Diagnosis not present

## 2023-10-16 DIAGNOSIS — Z905 Acquired absence of kidney: Secondary | ICD-10-CM | POA: Diagnosis not present

## 2023-10-16 DIAGNOSIS — G939 Disorder of brain, unspecified: Secondary | ICD-10-CM | POA: Diagnosis not present

## 2023-10-16 DIAGNOSIS — R918 Other nonspecific abnormal finding of lung field: Secondary | ICD-10-CM | POA: Diagnosis not present

## 2023-10-16 MED ORDER — GADOBUTROL 1 MMOL/ML IV SOLN
10.0000 mL | Freq: Once | INTRAVENOUS | Status: AC | PRN
  Administered 2023-10-16: 10 mL via INTRAVENOUS

## 2023-10-18 DIAGNOSIS — E119 Type 2 diabetes mellitus without complications: Secondary | ICD-10-CM | POA: Diagnosis not present

## 2023-10-18 DIAGNOSIS — H401131 Primary open-angle glaucoma, bilateral, mild stage: Secondary | ICD-10-CM | POA: Diagnosis not present

## 2023-10-22 ENCOUNTER — Inpatient Hospital Stay
Admission: RE | Admit: 2023-10-22 | Discharge: 2023-10-22 | Disposition: A | Discharge status: Home / Self Care | Payer: Self-pay | Source: Ambulatory Visit

## 2023-10-22 ENCOUNTER — Other Ambulatory Visit: Payer: Self-pay

## 2023-10-22 DIAGNOSIS — C641 Malignant neoplasm of right kidney, except renal pelvis: Secondary | ICD-10-CM

## 2023-10-23 ENCOUNTER — Telehealth: Payer: Self-pay

## 2023-10-23 ENCOUNTER — Inpatient Hospital Stay (HOSPITAL_BASED_OUTPATIENT_CLINIC_OR_DEPARTMENT_OTHER): Payer: Medicare Other

## 2023-10-23 ENCOUNTER — Other Ambulatory Visit (HOSPITAL_COMMUNITY): Payer: Self-pay

## 2023-10-23 VITALS — BP 145/68 | HR 72 | Temp 97.9°F | Resp 20 | Wt 228.2 lb

## 2023-10-23 DIAGNOSIS — C641 Malignant neoplasm of right kidney, except renal pelvis: Secondary | ICD-10-CM | POA: Diagnosis not present

## 2023-10-23 DIAGNOSIS — Z85528 Personal history of other malignant neoplasm of kidney: Secondary | ICD-10-CM | POA: Diagnosis not present

## 2023-10-23 DIAGNOSIS — Z8546 Personal history of malignant neoplasm of prostate: Secondary | ICD-10-CM | POA: Diagnosis not present

## 2023-10-23 DIAGNOSIS — Z9189 Other specified personal risk factors, not elsewhere classified: Secondary | ICD-10-CM

## 2023-10-23 DIAGNOSIS — Z905 Acquired absence of kidney: Secondary | ICD-10-CM | POA: Diagnosis not present

## 2023-10-23 DIAGNOSIS — D649 Anemia, unspecified: Secondary | ICD-10-CM | POA: Diagnosis not present

## 2023-10-23 MED ORDER — PROCHLORPERAZINE MALEATE 10 MG PO TABS
10.0000 mg | ORAL_TABLET | Freq: Four times a day (QID) | ORAL | 1 refills | Status: DC | PRN
  Filled 2023-10-23: qty 30, 8d supply, fill #0

## 2023-10-23 MED ORDER — ONDANSETRON HCL 8 MG PO TABS
8.0000 mg | ORAL_TABLET | Freq: Three times a day (TID) | ORAL | 1 refills | Status: DC | PRN
  Filled 2023-10-23: qty 30, 10d supply, fill #0

## 2023-10-23 MED ORDER — LENVATINIB (20 MG DAILY DOSE) 2 X 10 MG PO CPPK
20.0000 mg | ORAL_CAPSULE | Freq: Every day | ORAL | 11 refills | Status: DC
  Filled 2023-11-05: qty 60, 30d supply, fill #0
  Filled 2023-11-27: qty 60, 30d supply, fill #1
  Filled 2023-12-25: qty 60, 30d supply, fill #2
  Filled 2024-01-22: qty 60, 30d supply, fill #3
  Filled 2024-02-11: qty 60, 30d supply, fill #4
  Filled 2024-03-19: qty 60, 30d supply, fill #5

## 2023-10-23 NOTE — Progress Notes (Signed)
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

## 2023-10-23 NOTE — Patient Instructions (Addendum)
Mr. [name]. 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  We discussed treatment will be palliative intent, mean not curable but trying to control the disease.  Recommend a targeted therapy pill with immunotherapy as first-line of treatment.  Discussed results from [id number] trial showed combination of lenvatinib plus pembrolizumab is effective in non-clear-cell RCC.  Follow-up was 49%.  54% overall response rate with papillary histology.  Progression-free survival was 18 months. The estimated 38-month progression-free survival was 63%. Lenvatinib is given by mouth daily.  Pembrolizumab is given through IV every 3 weeks.  Side effects of lenvatinib may include fatigue, diarrhea, arthralgia, myalgia, decreased appetite, nausea, vomiting, hypothyroidism, hypertension, mouth sores, rash, weight loss, dysphonia, proteinuria, hand foot syndrome, abdominal pain, bleeding, constipation, liver toxicity, headaches, kidney injury, blood clots, difficulty with wound healing, and rare osteonecrosis of the jaw, encephalopathy, heart failure, arrhythmia have been reported  Side effects of immunotherapy are mostly related to immune related reaction.  They depend on which organ may be affected.  Patient can have hyper or hypothyroidism, skin rash, fever, pneumonitis, hepatitis, carditis, colitis, nephritis or other endorgan damage from immune related reaction.  Severe fatal reaction such as carditis or encephalitis has been reported. Rarely severe side effects can result in death.   Once we start treatment we need to monitor: Monitor for blood pressure Baseline EKG Labs every 2 weeks for 2 months and then monthly thereafter Baseline thyroid function and then monthly Check urine from time to time Dental exam before and periodically Monitor  for signs of bleeding, wound healing, thrombosis  You will  need follow up with [place] closely. We will repeat imaging in 2 months after starting treatment Follow up one week after starting lenvatinib.

## 2023-10-23 NOTE — Assessment & Plan Note (Addendum)
Recommend start lenvatinib with pembrolizumab Ordering lenvatinib 20 mg daily Monitor for toxicity CT CAP in about 3 months MRI in about 2 month after starting treatment

## 2023-10-23 NOTE — Assessment & Plan Note (Addendum)
  Drug monitoring for lenvatinib. Monitor for hypertension Baseline EKG next visit LFT, renal function, electrolytes, every 2 weeks for 2 months and then monthly thereafter Baseline TSH and free T4 and then monthly urine dipstick; if 2+ then obtain a 24-hour urine protein Dental exam before and periodically Monitor for signs of bleeding, wound healing, thrombosis Follow up one week after starting lenvatinib.

## 2023-10-23 NOTE — Telephone Encounter (Signed)
Per secure chat with Dr. Cherly Hensen he has requested to see patient on 10/23/2023; patient is aware of scheduled appointment and has confirmed the timeframe due to his work schedule, message has been sent over to MD.

## 2023-10-23 NOTE — Progress Notes (Signed)
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   Most recent PSA 0.022 on 09/10/23.   Right RCC Initial diagnosis: 10/2022. pT3cN0M0 G3 right papillary RCC, type 2 Treatment: 10/27/22 Open radical nephrectomy with IVC repair, cardiac bypass for level 3 thrombus at [facility] Renal function: 10/09/23 Cr. 1.53. Ca 10.2. WBC 6.7. HGB 13.4 PLT 221. ANC 4.4  IMDC risk: intermediate 1 risk factor  Prognostic Factors  yes: Less than one year from time of diagnosis to systemic therapy  no: Performance status <80% (Karnofsky)  no: Hemoglobin < lower limit of normal (Normal: 120 g/L or 12 g/dL)  no: Calcium > upper limit of normal (Normal: 8.5-10.2 mg/dL)  no: Neutrophil > upper limit of normal (Normal: 2.0-7.010?/L)  no: Platelets > upper limit of normal (Normal: 150,000-400,000)  Case reviewed at tumor board today. Staging imaging show brain metastases, and pulmonary nodules suggestive of lung metastases.  We discussed the results today.  Clinical presentation is consistent with stage IV RCC.  We discussed treatment will be palliative intent.  Recommend TKI with immunotherapy as first-line of treatment.  Discussed results from [id number] trial showed combination of lenvatinib plus pembrolizumab is effective in non-clear-cell RCC.  Follow-up was 49%.  54% overall response rate with papillary histology.  Progression-free survival was 18 months. The estimated 5-month progression-free survival was 63%. Lenvatinib is given by mouth daily.  Pembrolizumab is given through IV every 3 weeks.  Side effects of lenvatinib may include fatigue, diarrhea, arthralgia,  myalgia, decreased appetite, nausea, vomiting, hypothyroidism, hypertension, mouth sores, rash, weight loss, dysphonia, proteinuria, hand foot syndrome, abdominal pain, bleeding, constipation, liver toxicity, headaches, kidney injury, blood clots, difficulty with wound healing, and rare osteonecrosis of the jaw, encephalopathy, heart failure, arrhythmia have been reported  Side effects of immunotherapy are mostly related to immune related reaction.  They depend on which organ may be affected.  Patient can have hyper or hypothyroidism, skin rash, fever, pneumonitis, hepatitis, carditis, colitis, nephritis or other endorgan damage from immune related reaction.  Severe fatal reaction such as carditis or encephalitis has been reported. Rarely severe side effects can result in death.  After discussion [name] and would like to proceed.    Renal cell carcinoma, right (HCC) Recommend start lenvatinib with pembrolizumab Ordering lenvatinib 20 mg daily Monitor for toxicity CT CAP in about 3 months MRI in about 2 month after starting treatment  At risk for side effect of medication  Drug monitoring for lenvatinib. Monitor for hypertension Baseline EKG LFT, renal function, electrolytes, every 2 weeks for 2 months and then monthly thereafter Baseline TSH and free T4 and then monthly urine dipstick; if 2+ then obtain a 24-hour urine protein Dental exam before and periodically Monitor for signs of bleeding, wound healing, thrombosis Follow up one week after starting lenvatinib.   Brain metastases Will start TKI/pembrolizumab MRI about 2 months after starting systemic treatment SRS if needed after systemic treatment  Schedule for teaching. Plan to start treatment on about 12/12 Labs and then MD visit with me and then infusion every 3 weeks Add on appointment with me on Friday 12/20 for toxicity evaluation. Ok to cancel  MD appointment on 12/3  The patient Moreno the plans discussed  today and is in agreement with them.  He knows to contact our office if he develops concerns prior to his next appointment.  [name], MD  [facility] - A DEPT OF [facility] [address] [place] [address] Dept: [phone] Dept Fax: [phone]   Orders Placed This Encounter  Procedures   Consent Attestation for Oncology Treatment    Order Specific Question:   The patient is informed of risks, benefits, side-effects of the prescribed oncology treatment. Potential short term and long term side effects and response rates discussed. After a long discussion, the patient made informed decision to proceed.    Answer:   Yes   Lactate dehydrogenase    Standing Status:   Future    Standing Expiration Date:   11/07/2024   CBC with Differential (Cancer Center Only)    Standing Status:   Future    Standing Expiration Date:   11/07/2024   CMP (Cancer Center only)    Standing Status:   Future    Standing Expiration Date:   11/07/2024   T4    Standing Status:   Future    Standing Expiration Date:   11/07/2024   TSH    Standing Status:   Future    Standing Expiration Date:   11/07/2024   PHYSICIAN COMMUNICATION ORDER    Required labs: Thyroid function tests (TSH/T4) at baseline and every 3rd cycle.   ONCBCN PHYSICIAN COMMUNICATION 1    Treat until Progression, Unacceptable Toxicity, or up to 24 Months.   EKG 12-Lead      CHIEF COMPLAINT:  CC: mRCC  Current Treatment:  to be started  INTERVAL HISTORY:  [name] is here today for repeat clinical assessment. He denies any headaches, dizziness, coughing.  I have reviewed the past medical history, past surgical history, social history and family history with the patient and they are unchanged from previous note.  ALLERGIES:  is allergic to bee venom, aspirin, and penicillins.  MEDICATIONS:  Current Outpatient Medications  Medication Sig Dispense Refill    lenvatinib 20 mg daily dose (LENVIMA) 2 x 10 MG capsule Take 2 capsules (20 mg total) by mouth daily. 60 capsule 11   amLODipine (NORVASC) 5 MG tablet Take 5 mg by mouth daily.  3   EPINEPHrine 0.3 mg/0.3 mL IJ SOAJ injection epinephrine 0.3 mg/0.3 mL injection, auto-injector     finasteride (PROSCAR) 5 MG tablet Take 5 mg by mouth daily.     Grape Seed Extract 100 MG CAPS Take 300 mg by mouth. 300 mg     latanoprost (XALATAN) 0.005 % ophthalmic solution 1 drop at bedtime.     lisinopril-hydrochlorothiazide (PRINZIDE,ZESTORETIC) 20-25 MG per tablet Take 1 tablet by mouth every morning.   5   metformin (FORTAMET) 500 MG (OSM) 24 hr tablet Take 500 mg by mouth daily with breakfast.     Multiple Vitamins-Minerals (MULTIVITAMIN WITH MINERALS) tablet Take 1 tablet by mouth daily.     ondansetron (ZOFRAN) 8 MG tablet Take 1 tablet (8 mg total) by mouth every 8 (eight) hours as needed for nausea or vomiting. 30 tablet 1   OVER THE COUNTER MEDICATION Lion's mane supplement     prochlorperazine (COMPAZINE) 10 MG tablet Take 1 tablet (10 mg total) by mouth every 6 (six) hours as needed for nausea or vomiting. 30 tablet 1   tamsulosin (FLOMAX)  0.4 MG CAPS capsule Take 0.4 mg by mouth daily after breakfast.      No current facility-administered medications for this visit.    HISTORY OF PRESENT ILLNESS:   Oncology History  Malignant neoplasm of prostate (HCC)  04/01/2018 Initial Diagnosis   Malignant neoplasm of prostate (HCC)   04/30/2018 Genetic Testing   Multi-Cancer panel (83 genes) @ [facility] - No pathogenic mutations detected Variants of Uncertain Significance in ATM, SDHA and TSC2  Genes Analyzed: 83 genes on [facility]'s Multi-Cancer panel (ALK, APC, ATM, AXIN2, BAP1, BARD1, BLM, BMPR1A, BRCA1, BRCA2, BRIP1, CASR, CDC73, CDH1, CDK4, CDKN1B, CDKN1C, CDKN2A, CEBPA, CHEK2, CTNNA1, DICER1, DIS3L2, EGFR, EPCAM, FH, FLCN, GATA2, GPC3, GREM1, HOXB13, HRAS, KIT, MAX, MEN1, MET, MITF, MLH1, MSH2, MSH3, MSH6,  MUTYH, NBN, NF1, NF2, NTHL1, PALB2, PDGFRA, PHOX2B, PMS2, POLD1, POLE, POT1, PRKAR1A, PTCH1, PTEN, RAD50, RAD51C, RAD51D, RB1, RECQL4, RET, RUNX1, SDHA, SDHAF2, SDHB, SDHC, SDHD, SMAD4, SMARCA4, SMARCB1, SMARCE1, STK11, SUFU, TERC, TERT, TMEM127, TP53, TSC1, TSC2, VHL, WRN, WT1).  UPDATE: ATM [id number] VUS was amended to "Likely Benign" due to a re-review of the evidence in light of new variant interpretation guidelines and/or new information.   Renal cell carcinoma, right (HCC)  09/29/2022 Imaging   CT chest 1.  No evidence of metastatic disease in the thorax.  2.  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 Consider correlation with genetic testing.  3.  Reference same day abdominal MRI for details regarding the right renal mass and associated tumor thrombus in the right renal vein and IVC    09/29/2022 Imaging   MRI 1.  Right interpolar renal 3.5 cm mass concerning for primary renal malignancy. Upper pole and lower pole tumor is favored to primarily be intravenous tumor thrombus, with intravenous tumor expanding the right renal vein, and the subdiaphragmatic IVC as detailed above. Differential includes renal cell carcinoma (favored) and transitional cell carcinoma.  2.  Please see outside 08/25/2022 CT abdomen and pelvis for evaluation of the collecting system on delayed/urographic phase.  3.  No definite lymphadenopathy.  4.  Signal changes in the L5 superior endplate are favored to reflect a fracture. However, recommend continued attention on follow-up.    10/27/2022 Pathology Results   SPECIMEN    Procedure:    Radical nephrectomy     Specimen Laterality:    Right   TUMOR    Tumor Focality:    Unifocal     Tumor Size:    Greatest Dimension (Centimeters): 8.2 cm     Histologic Type:    Papillary renal cell carcinoma, type 2     Histologic Grade (WHO / ISUP):    G3 (nucleoli conspicuous and eosinophilic at 100x  magnification)     Tumor Extent:    Extends into perinephric tissue (beyond renal capsule)     Tumor Extent:    Extends into renal sinus     Tumor Extent:    Extends into major vein (renal vein or its segmental branches, inferior vena cava)     Sarcomatoid Features:    Not identified     Rhabdoid Features:    Not identified     Tumor Necrosis:    Not identified     Lymphovascular Invasion:    Present   MARGINS    Margin Status:    Invasive carcinoma present at margin       Margin(s) Involved by Invasive Carcinoma:    Renal vein: present in renal  vein nephrectomy margin; No tumor seen is separately submitted renal vein segment.   REGIONAL LYMPH NODES     Regional Lymph Node Status:    Not applicable (no regional lymph nodes submitted or found)   PATHOLOGIC STAGE CLASSIFICATION (pTNM, AJCC 8th Edition)     Reporting of pT, pN, and (when applicable) pM categories is based on information available to the pathologist at the time the report is issued. As per the AJCC (Chapter 1, 8th Ed.) it is the managing physician's responsibility to establish the final  pathologic stage based upon all pertinent information, including but potentially not limited to this pathology report.     Primary Tumor (pT):    pT3b     Regional Lymph Nodes (pN):    pN not assigned (no nodes submitted or found)    03/08/2023 Imaging   CT CAP Status post radical right nephrectomy, IVC repair, and partial right adrenalectomy without evidence of recurrent or metastatic disease.  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  L5 compression deformity without progressive interval height loss  Lungs/Pleura: No large airspace consolidation or pleural effusion. Similar distribution of numerous thin-walled pulmonary cysts throughout the bilateral lungs. Bibasilar atelectasis/scarring. No new pulmonary nodules.    09/13/2023 Imaging   CT CAP 5 mm left lower lobe  pulmonary nodule Solid upper lobe pulmonary nodule 3 mm 4 mm right lower lobe nodule Sclerotic focus T1 change from prior likely bone island.  No aggressive lytic lesion.  Multilevel degenerative changes of spine and bilateral shoulder Similar appearance of sclerotic superior endplate deformity at L5 unchanged dating back to September 2023.  New from April 2019. 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 No pathologically enlarged abdominal or pelvic lymph nodes.   10/08/2023 Initial Diagnosis   Renal cell carcinoma, right (HCC)   11/08/2023 -  Chemotherapy   Patient is on Treatment Plan : BLADDER Pembrolizumab (200) q21d         REVIEW OF SYSTEMS:   All relevant systems were reviewed with the patient and are negative.   VITALS:  Blood pressure (!) 145/68, pulse 72, temperature 97.9 F (36.6 C), resp. rate 20, weight 228 lb 3.2 oz (103.5 kg), SpO2 100%.  Wt Readings from Last 3 Encounters:  10/23/23 228 lb 3.2 oz (103.5 kg)  10/09/23 228 lb 3.2 oz (103.5 kg)  02/08/22 220 lb (99.8 kg)    Body mass index is 28.52 kg/m.  Performance status (ECOG): 0 - Asymptomatic  PHYSICAL EXAM:   GENERAL:alert, no distress and comfortable SKIN: skin color normal EYES: normal, sclera clear NECK: supple,  non-tender, without nodularity LYMPH:  no palpable cervical lymphadenopathy LUNGS: clear to auscultation with normal breathing effort.  No wheeze or rales HEART: regular rate & rhythm and no murmurs and no lower extremity edema ABDOMEN: abdomen soft, non-tender and nondistended Musculoskeletal: no edema NEURO: alert, fluent speech, no focal motor/sensory deficits.  Strength and sensation equal bilaterally.  LABORATORY DATA:  I  have reviewed the data as listed    Component Value Date/Time   NA 138 10/09/2023 1139   K 4.0 10/09/2023 1139   CL 103 10/09/2023 1139   CO2 30 10/09/2023 1139   GLUCOSE 107 (H) 10/09/2023 1139  BUN 20 10/09/2023 1139   CREATININE 1.53 (H) 10/09/2023 1139   CALCIUM 10.2 10/09/2023 1139   PROT 8.3 (H) 10/09/2023 1139   ALBUMIN 4.4 10/09/2023 1139   AST 22 10/09/2023 1139   ALT 38 10/09/2023 1139   ALKPHOS 82 10/09/2023 1139   BILITOT 0.4 10/09/2023 1139   GFRNONAA 47 (L) 10/09/2023 1139   GFRAA >60 04/16/2020 2121    No results found for: "SPEP", "UPEP"  Lab Results  Component Value Date   WBC 6.7 10/09/2023   NEUTROABS 4.4 10/09/2023   HGB 13.4 10/09/2023   HCT 40.8 10/09/2023   MCV 83.4 10/09/2023   PLT 221 10/09/2023      Chemistry      Component Value Date/Time   NA 138 10/09/2023 1139   K 4.0 10/09/2023 1139   CL 103 10/09/2023 1139   CO2 30 10/09/2023 1139   BUN 20 10/09/2023 1139   CREATININE 1.53 (H) 10/09/2023 1139      Component Value Date/Time   CALCIUM 10.2 10/09/2023 1139   ALKPHOS 82 10/09/2023 1139   AST 22 10/09/2023 1139   ALT 38 10/09/2023 1139   BILITOT 0.4 10/09/2023 1139       RADIOGRAPHIC STUDIES: I have personally reviewed the radiological images as listed and agreed with the findings in the report. MR Brain W Wo Contrast  Result Date: 10/23/2023 CLINICAL DATA:  Kidney cancer staging. EXAM: MRI HEAD WITHOUT AND WITH CONTRAST TECHNIQUE: Multiplanar, multiecho pulse sequences of the brain and surrounding structures were obtained without and with intravenous contrast. CONTRAST:  10mL GADAVIST GADOBUTROL 1 MMOL/ML IV SOLN COMPARISON:  06/26/2015 FINDINGS: Brain: 24 enhancing brain lesions consistent with metastatic disease. These are marked on axial postcontrast images, series 20. The largest is at the superior left frontal lobe measuring 12 mm. No significant associated brain edema. There are scattered simple cystic intensity structures  in the brain most consistent with dilated perivascular spaces. No superimposed infarct, acute hemorrhage, hydrocephalus, or collection. Vascular: Major flow voids and vascular enhancements are preserved Skull and upper cervical spine: No focal marrow lesion is seen. Sinuses/Orbits: Negative Other: Bulbous masslike area in the left cheek measuring 6 cm in length, suspect this is in the oral cavity rather than a submucosal mass. IMPRESSION: 1. 24 brain masses consistent with metastatic disease. The largest measures 12 mm and there is no associated mass effect or significant edema. 2. 6 cm masslike area in the left cheek, favored to be intraoral chew, but correlate with exam Electronically Signed   By: Tiburcio Pea M.D.   On: 10/23/2023 07:56   MR Abdomen W Wo Contrast  Result Date: 10/19/2023 CLINICAL DATA:  Status post right nephrectomy for renal cell carcinoma, abnormal soft tissue suspected in the nephrectomy bed identified by prior CT EXAM: MRI ABDOMEN WITHOUT AND WITH CONTRAST TECHNIQUE: Multiplanar multisequence MR imaging of the abdomen was performed both before and after the administration of intravenous contrast. CONTRAST:  10mL GADAVIST GADOBUTROL 1 MMOL/ML IV SOLN COMPARISON:  CT chest abdomen pelvis, 09/13/2023 FINDINGS: Lower chest: No acute abnormality. Multiple small pulmonary nodules in the bilateral lung bases, better assessed by CT (series 11, image 24). Hepatobiliary: No solid liver abnormality is seen. No gallstones, gallbladder wall thickening, or biliary dilatation. Pancreas: Unilocular fluid signal cystic lesion within the pancreatic tail measuring 1.2 x 1.1 cm (series 18, image 17). No pancreatic ductal dilatation or surrounding inflammatory changes. Spleen: Normal in size without significant abnormality. Adrenals/Urinary Tract: Adrenal glands are unremarkable. Enhancing soft tissue  nodule of the superior aspect of the right nephrectomy bed closely abutting the underlying right psoas  muscle, measuring 1.6 x 0.9 cm (series 11, image 42). Additional small enhancing nodule in the superior hepatorenal fossa measuring 0.7 x 0.7 cm (series 11, image 48). The left kidney is normal, without renal calculi, solid lesion, or hydronephrosis. Stomach/Bowel: Stomach is within normal limits. No evidence of bowel wall thickening, distention, or inflammatory changes. Vascular/Lymphatic: No significant vascular findings are present. No enlarged abdominal lymph nodes. Other: No abdominal wall hernia or abnormality. No ascites. Musculoskeletal: No acute or significant osseous findings. IMPRESSION: 1. Enhancing soft tissue nodule in the superior aspect of the right nephrectomy bed closely abutting the underlying right psoas muscle, measuring 1.6 x 0.9 cm. Additional small enhancing nodule in the superior hepatorenal fossa measuring 0.7 x 0.7 cm. Findings are highly suspicious for locally recurrent renal cell carcinoma, particularly in the setting of presumed pulmonary metastases. 2. Multiple small pulmonary nodules in the bilateral lung bases, better assessed by CT and highly suspicious for pulmonary metastases. 3. 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 In the setting of other known and presumed metastatic malignancy, no specific further follow-up or characterization is required. Electronically Signed   By: Jearld Lesch M.D.   On: 10/19/2023 10:57

## 2023-10-24 ENCOUNTER — Telehealth: Payer: Self-pay | Admitting: Pharmacy Technician

## 2023-10-24 ENCOUNTER — Other Ambulatory Visit: Payer: Self-pay

## 2023-10-24 ENCOUNTER — Telehealth: Payer: Self-pay

## 2023-10-24 ENCOUNTER — Other Ambulatory Visit (HOSPITAL_COMMUNITY): Payer: Self-pay

## 2023-10-24 NOTE — Telephone Encounter (Signed)
Oral Oncology Pharmacist Encounter  Received new prescription for Lenvima (lenvatinib) for the treatment of metastatic renal cell carcinoma in conjunction with Pembrolizumab, planned duration until disease progression or unacceptable toxicity.  New labs will be needed before starting therapy.   Prescription dose and frequency assessed for appropriateness.   Current medication list in Epic reviewed, no relevant DDIs with Lenvima identified.  Evaluated chart and no patient barriers to medication adherence noted.   Patient agreement for treatment documented in MD note on 10/23/2023.  Prescription has been e-scribed to the [facility] for benefits analysis and approval.  Oral Oncology Clinic will continue to follow for insurance authorization, copayment issues, initial counseling and start date.  Francetta Found, PharmD Candidate Class of 2025  10/24/2023 8:44 AM Oral Oncology Clinic [phone]

## 2023-10-24 NOTE — Telephone Encounter (Addendum)
Oral Oncology Patient Advocate Encounter   Received notification that prior authorization for Lenvima is required.   PA submitted on 10/24/23 Key  [id number] Status is pending     Jinger Neighbors, CPhT-Adv Oncology Pharmacy Patient Advocate [facility] Direct Number: [phone]  Fax: [phone]

## 2023-10-24 NOTE — Telephone Encounter (Signed)
Oral Oncology Patient Advocate Encounter  Prior Authorization for [name] has been approved.    PA# [id number] Effective dates: 10/24/23 through 10/22/24  Patients co-pay is $350.    Jinger Neighbors, CPhT-Adv Oncology Pharmacy Patient Advocate [facility] Direct Number: [phone]  Fax: [phone]

## 2023-10-24 NOTE — Telephone Encounter (Signed)
Oral Oncology Patient Advocate Encounter   Was successful in obtaining a copay card for Lenvima.  This copay card will make the patients copay $0.  The billing information is as follows and has been shared with [facility].   RxBin: [id number] Member ID: [id number] Group ID: [id number]   Jinger Neighbors, CPhT-Adv Oncology Pharmacy Patient Advocate [facility] Direct Number: [phone]  Fax: [phone]

## 2023-10-26 ENCOUNTER — Telehealth: Payer: Self-pay

## 2023-10-26 NOTE — Telephone Encounter (Signed)
Patient is aware of scheduled appointment times/dates for both patient education and also start date for treatment

## 2023-10-27 ENCOUNTER — Other Ambulatory Visit (HOSPITAL_COMMUNITY): Payer: Self-pay

## 2023-10-27 ENCOUNTER — Other Ambulatory Visit: Payer: Self-pay

## 2023-10-29 ENCOUNTER — Other Ambulatory Visit: Payer: Self-pay | Admitting: *Deleted

## 2023-10-30 ENCOUNTER — Inpatient Hospital Stay: Payer: Medicare Other

## 2023-10-30 DIAGNOSIS — Z7984 Long term (current) use of oral hypoglycemic drugs: Secondary | ICD-10-CM | POA: Insufficient documentation

## 2023-10-30 DIAGNOSIS — R918 Other nonspecific abnormal finding of lung field: Secondary | ICD-10-CM | POA: Insufficient documentation

## 2023-10-30 DIAGNOSIS — Z5112 Encounter for antineoplastic immunotherapy: Secondary | ICD-10-CM | POA: Insufficient documentation

## 2023-10-30 DIAGNOSIS — Z9221 Personal history of antineoplastic chemotherapy: Secondary | ICD-10-CM | POA: Insufficient documentation

## 2023-10-30 DIAGNOSIS — C61 Malignant neoplasm of prostate: Secondary | ICD-10-CM | POA: Insufficient documentation

## 2023-10-30 DIAGNOSIS — C7931 Secondary malignant neoplasm of brain: Secondary | ICD-10-CM | POA: Insufficient documentation

## 2023-10-30 DIAGNOSIS — Z79899 Other long term (current) drug therapy: Secondary | ICD-10-CM | POA: Insufficient documentation

## 2023-10-30 DIAGNOSIS — J984 Other disorders of lung: Secondary | ICD-10-CM | POA: Insufficient documentation

## 2023-10-30 DIAGNOSIS — E785 Hyperlipidemia, unspecified: Secondary | ICD-10-CM | POA: Insufficient documentation

## 2023-10-30 DIAGNOSIS — K862 Cyst of pancreas: Secondary | ICD-10-CM | POA: Insufficient documentation

## 2023-10-30 DIAGNOSIS — E119 Type 2 diabetes mellitus without complications: Secondary | ICD-10-CM | POA: Insufficient documentation

## 2023-10-30 DIAGNOSIS — C641 Malignant neoplasm of right kidney, except renal pelvis: Secondary | ICD-10-CM | POA: Insufficient documentation

## 2023-10-30 DIAGNOSIS — Z905 Acquired absence of kidney: Secondary | ICD-10-CM | POA: Insufficient documentation

## 2023-10-30 DIAGNOSIS — I10 Essential (primary) hypertension: Secondary | ICD-10-CM | POA: Insufficient documentation

## 2023-11-01 ENCOUNTER — Other Ambulatory Visit (HOSPITAL_COMMUNITY): Payer: Self-pay

## 2023-11-01 NOTE — Progress Notes (Signed)

## 2023-11-05 ENCOUNTER — Other Ambulatory Visit: Payer: Self-pay

## 2023-11-05 ENCOUNTER — Other Ambulatory Visit: Payer: Self-pay | Admitting: Pharmacy Technician

## 2023-11-05 ENCOUNTER — Other Ambulatory Visit (HOSPITAL_COMMUNITY): Payer: Self-pay

## 2023-11-05 NOTE — Progress Notes (Signed)
Specialty Pharmacy Initial Fill Coordination Note  [name] is a 77 y.o. male contacted today regarding refills of specialty medication(s) Lenvatinib Mesylate .  Patient requested [name] at [facility] [place]  on 11/08/23   Medication will be filled on 11/07/23.   Patient is aware of $0 copayment.

## 2023-11-05 NOTE — Telephone Encounter (Addendum)
Oral Chemotherapy Pharmacist Encounter  I spoke with patient for overview of: Lenvima (lenvatinib) for the treatment of renal cell carcinoma in conjunction with pembrolizumab, planned duration until disease progression or unacceptable toxicity.   Counseled patient on administration, dosing, side effects, monitoring, drug-food interactions, safe handling, storage, and disposal.  Patient will take Lenvima 10mg  capsules, 2 capsules (20mg ) by mouth once daily, with or without food, at approximately the same time each day.  Lenvima start date: 11/08/2023  Adverse effects include but are not limited to: hypertension, hand-foot syndrome, diarrhea, joint pain, fatigue, headache, decreased calcium, proteinuria, increased risk of blood clots, and cardiac conduction issues.   Patient will obtain anti diarrheal and alert the office of 4 or more loose stools above baseline.  Patient will be receiving labs regarding TSH/ T4 and EKG on 11/08/23. Current labs are from 10/09/23. Patient will receive remaining baseline labs on 12/12 prior to starting therapy and they will be assessed by MD.   Patient instructed to notify office of any upcoming invasive procedures.  [name] will be held for 6 days prior to scheduled surgery, restart based on healing and clinical judgement.   Reviewed with patient importance of keeping a medication schedule and plan for any missed doses. No barriers to medication adherence identified.  Medication reconciliation performed and medication/allergy list updated.  Insurance authorization for [name] has been obtained. Test claim at the pharmacy revealed copayment $0 for 1st fill of 30 days and patient will pick up from [facility] long outpatient pharmacy on 11/08/23.   Patient informed the pharmacy will reach out 5-7 days prior to needing next fill of Lenvima to coordinate continued medication acquisition to prevent break in therapy.  All questions answered. Patient voiced  understanding and appreciation.   Medication education handout placed in mail for patient. Patient knows to call the office with questions or concerns. Oral Chemotherapy Clinic phone number provided to patient.   Bethel Born, PharmD Hematology/Oncology Clinical Pharmacist [facility] [phone] 11/05/2023   1:24 PM

## 2023-11-05 NOTE — Progress Notes (Signed)
Patient counseled in telephone encounter opened on 10/24/2023.  Bethel Born, PharmD Hematology/Oncology Clinical Pharmacist [facility] [phone]

## 2023-11-07 ENCOUNTER — Other Ambulatory Visit: Payer: Self-pay

## 2023-11-07 NOTE — Progress Notes (Unsigned)
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 Currently with stage IV RCC. Staging imaging show brain metastases, and pulmonary nodules suggestive of lung metastases    Most recent PSA 0.022 on 09/10/23.  Current diagnosis: stage IV RCC with brain metastases Initial diagnosis: 10/2022. pT3cN0M0 G3 right papillary RCC, type 2 Treatment: 10/27/22 Open radical nephrectomy with IVC repair, cardiac bypass for level 3 thrombus at [facility] Renal function: 10/09/23 Cr. 1.53. Ca 10.2. WBC 6.7. HGB 13.4 PLT 221. ANC 4.4   IMDC risk: intermediate 1 risk factor  Renal cell carcinoma, right (HCC) Will start lenvatinib with pembrolizumab today Ordering lenvatinib 20 mg daily Monitor for toxicity CT CAP in about 3 months MRI in about 2 month after starting treatment SRS if needed after systemic treatment  At risk for side effect of medication Drug monitoring for lenvatinib. Monitor for hypertension with baseline hypertension Baseline EKG normal today. LFT, renal function, electrolytes, every 2 weeks for 2 months and then monthly thereafter Baseline TSH and free T4 and then monthly urine dipstick; if 2+ then obtain a 24-hour urine protein Dental exam before and periodically Monitor for signs of bleeding, wound healing, thrombosis Follow up one week after starting lenvatinib.  HTN (hypertension) Monitor BP at rest at home. Continue amlodipine 5 mg daily, Prinzide 20-25 mg daily   See me on 12/20 with lab as scheduled. The patient understands the plans discussed today and is in agreement with them.  He knows to contact our office if he develops  concerns prior to his next appointment.  [name], MD  [facility] [address] [place] [address] Dept: [phone] Dept Fax: [phone]   Orders Placed This Encounter  Procedures   CMP (Cancer Center only)    Standing Status:   Future    Expiration Date:   11/07/2024   Magnesium    Standing Status:   Future    Expiration Date:   11/07/2024      CHIEF COMPLAINT:  CC: stage IV RCC  Current Treatment:  len/pembro  INTERVAL HISTORY:  [name] is here today for repeat clinical assessment. He denies fevers or chills. He denies pain. His appetite is good. No new change.  No falling. No headache.  I have reviewed the past medical history, past surgical history, social history and family history with the patient and they are unchanged from previous note.  ALLERGIES:  is allergic to bee venom, aspirin, and penicillins.  MEDICATIONS:  Current Outpatient Medications  Medication Sig Dispense Refill   amLODipine (NORVASC) 5 MG tablet Take 5 mg by mouth daily.  3   EPINEPHrine 0.3 mg/0.3 mL IJ SOAJ injection epinephrine 0.3 mg/0.3 mL injection, auto-injector     finasteride (PROSCAR) 5 MG tablet Take 5 mg by mouth daily.     Grape Seed Extract 100 MG CAPS Take 300 mg by mouth. 300 mg     latanoprost (XALATAN) 0.005 % ophthalmic solution 1 drop at bedtime.     lenvatinib 20 mg daily dose (LENVIMA) 2 x 10  MG capsule Take 2 capsules (20 mg total) by mouth daily. 60 capsule 11   lisinopril-hydrochlorothiazide (PRINZIDE,ZESTORETIC) 20-25 MG per tablet Take 1 tablet by mouth every morning.   5   metformin (FORTAMET) 500 MG (OSM) 24 hr tablet Take 500 mg by mouth daily with breakfast.     Multiple Vitamins-Minerals (MULTIVITAMIN WITH MINERALS) tablet Take 1 tablet by mouth daily.     ondansetron (ZOFRAN) 8 MG tablet Take 1 tablet (8 mg total) by mouth every 8 (eight) hours as needed for  nausea or vomiting. 30 tablet 1   OVER THE COUNTER MEDICATION Lion's mane supplement     prochlorperazine (COMPAZINE) 10 MG tablet Take 1 tablet (10 mg total) by mouth every 6 (six) hours as needed for nausea or vomiting. 30 tablet 1   tamsulosin (FLOMAX) 0.4 MG CAPS capsule Take 0.4 mg by mouth daily after breakfast.      No current facility-administered medications for this visit.   Facility-Administered Medications Ordered in Other Visits  Medication Dose Route Frequency Provider Last Rate Last Admin   0.9 %  sodium chloride infusion   Intravenous Continuous [name], MD   Stopped at 11/08/23 1303    HISTORY OF PRESENT ILLNESS:   Oncology History  Malignant neoplasm of prostate (HCC)  04/01/2018 Initial Diagnosis   Malignant neoplasm of prostate (HCC)   04/30/2018 Genetic Testing   Multi-Cancer panel (83 genes) @ [facility] - No pathogenic mutations detected Variants of Uncertain Significance in ATM, SDHA and TSC2  Genes Analyzed: 83 genes on [facility]'s Multi-Cancer panel (ALK, APC, ATM, AXIN2, BAP1, BARD1, BLM, BMPR1A, BRCA1, BRCA2, BRIP1, CASR, CDC73, CDH1, CDK4, CDKN1B, CDKN1C, CDKN2A, CEBPA, CHEK2, CTNNA1, DICER1, DIS3L2, EGFR, EPCAM, FH, FLCN, GATA2, GPC3, GREM1, HOXB13, HRAS, KIT, MAX, MEN1, MET, MITF, MLH1, MSH2, MSH3, MSH6, MUTYH, NBN, NF1, NF2, NTHL1, PALB2, PDGFRA, PHOX2B, PMS2, POLD1, POLE, POT1, PRKAR1A, PTCH1, PTEN, RAD50, RAD51C, RAD51D, RB1, RECQL4, RET, RUNX1, SDHA, SDHAF2, SDHB, SDHC, SDHD, SMAD4, SMARCA4, SMARCB1, SMARCE1, STK11, SUFU, TERC, TERT, TMEM127, TP53, TSC1, TSC2, VHL, WRN, WT1).  UPDATE: ATM [id number] VUS was amended to "Likely Benign" due to a re-review of the evidence in light of new variant interpretation guidelines and/or new information.   Renal cell carcinoma, right (HCC)  09/29/2022 Imaging   CT chest 1.  No evidence of metastatic disease in the thorax.  2.  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 Consider correlation with genetic testing.  3.  Reference same day abdominal MRI for details regarding the right renal mass and associated tumor thrombus in the right renal vein and IVC    09/29/2022 Imaging   MRI 1.  Right interpolar renal 3.5 cm mass concerning for primary renal malignancy. Upper pole and lower pole tumor is favored to primarily be intravenous tumor thrombus, with intravenous tumor expanding the right renal vein, and the subdiaphragmatic IVC as detailed above. Differential includes renal cell carcinoma (favored) and transitional cell carcinoma.  2.  Please see outside 08/25/2022 CT abdomen and pelvis for evaluation of the collecting system on delayed/urographic phase.  3.  No definite lymphadenopathy.  4.  Signal changes in the L5 superior endplate are favored to reflect a fracture. However, recommend continued attention on follow-up.    10/27/2022 Pathology Results   SPECIMEN    Procedure:    Radical nephrectomy     Specimen Laterality:    Right   TUMOR    Tumor Focality:  Unifocal     Tumor Size:    Greatest Dimension (Centimeters): 8.2 cm     Histologic Type:    Papillary renal cell carcinoma, type 2     Histologic Grade (WHO / ISUP):    G3 (nucleoli conspicuous and eosinophilic at 100x magnification)     Tumor Extent:    Extends into perinephric tissue (beyond renal capsule)     Tumor Extent:    Extends into renal sinus     Tumor Extent:    Extends into major vein (renal vein or its segmental branches, inferior vena cava)     Sarcomatoid Features:    Not identified     Rhabdoid Features:    Not identified     Tumor Necrosis:    Not identified     Lymphovascular Invasion:    Present   MARGINS    Margin Status:    Invasive carcinoma present at margin       Margin(s) Involved by Invasive Carcinoma:    Renal vein: present in renal vein nephrectomy margin; No tumor seen is separately submitted renal vein segment.   REGIONAL LYMPH  NODES     Regional Lymph Node Status:    Not applicable (no regional lymph nodes submitted or found)   PATHOLOGIC STAGE CLASSIFICATION (pTNM, AJCC 8th Edition)     Reporting of pT, pN, and (when applicable) pM categories is based on information available to the pathologist at the time the report is issued. As per the AJCC (Chapter 1, 8th Ed.) it is the managing physician's responsibility to establish the final  pathologic stage based upon all pertinent information, including but potentially not limited to this pathology report.     Primary Tumor (pT):    pT3b     Regional Lymph Nodes (pN):    pN not assigned (no nodes submitted or found)    03/08/2023 Imaging   CT CAP Status post radical right nephrectomy, IVC repair, and partial right adrenalectomy without evidence of recurrent or metastatic disease.  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  L5 compression deformity without progressive interval height loss  Lungs/Pleura: No large airspace consolidation or pleural effusion. Similar distribution of numerous thin-walled pulmonary cysts throughout the bilateral lungs. Bibasilar atelectasis/scarring. No new pulmonary nodules.    09/13/2023 Imaging   CT CAP 5 mm left lower lobe pulmonary nodule Solid upper lobe pulmonary nodule 3 mm 4 mm right lower lobe nodule Sclerotic focus T1 change from prior likely bone island.  No aggressive lytic lesion.  Multilevel degenerative changes of spine and bilateral shoulder Similar appearance of sclerotic superior endplate deformity at L5 unchanged dating back to September 2023.  New from April 2019. 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 No pathologically enlarged abdominal or pelvic lymph nodes.   10/08/2023 Initial Diagnosis   Renal cell carcinoma, right (HCC)   11/08/2023 -  Chemotherapy   Patient is on Treatment Plan : BLADDER Pembrolizumab (200) q21d         REVIEW OF SYSTEMS:   All relevant systems were reviewed with the patient and are negative.   VITALS:  Blood pressure 132/77, pulse 65, temperature 97.7 F (36.5 C), resp. rate 18, weight 224 lb  14.4 oz (102 kg), SpO2 100%.  Wt Readings from Last 3 Encounters:  11/08/23 224 lb 14.4 oz (102 kg)  10/23/23 228 lb 3.2 oz (103.5 kg)  10/09/23 228 lb 3.2 oz (103.5 kg)    Body mass index is 28.11 kg/m.  Performance status (ECOG): 1 - Symptomatic but completely ambulatory  PHYSICAL EXAM:   GENERAL:alert, no distress and comfortable SKIN: skin color normal, no rashes  EYES: normal, sclera clear OROPHARYNX: no exudate, no erythema    NECK: supple,  non-tender, without nodularity LYMPH:  no palpable cervical lymphadenopathy LUNGS: clear to auscultation with normal breathing effort.  No wheeze or rales HEART: regular rate & rhythm and no murmurs and no lower extremity edema ABDOMEN: abdomen soft, non-tender and nondistended Musculoskeletal: no edema NEURO: alert, fluent speech, no focal motor/sensory deficits.  Strength and sensation equal bilaterally.  LABORATORY DATA:  I have reviewed the data as listed    Component Value Date/Time   NA 138 11/08/2023 1006   K 3.7 11/08/2023 1006   CL 104 11/08/2023 1006   CO2 28 11/08/2023 1006   GLUCOSE 91 11/08/2023 1006   BUN 20 11/08/2023 1006   CREATININE 1.49 (H) 11/08/2023 1006   CALCIUM 10.2 11/08/2023 1006   PROT 8.7 (H) 11/08/2023 1006   ALBUMIN 4.5 11/08/2023 1006   AST 19 11/08/2023 1006   ALT 27 11/08/2023 1006   ALKPHOS 81 11/08/2023 1006   BILITOT 0.3 11/08/2023 1006   GFRNONAA 49 (L) 11/08/2023 1006   GFRAA >60 04/16/2020 2121    No results found for: "SPEP", "UPEP"  Lab  Results  Component Value Date   WBC 5.6 11/08/2023   NEUTROABS 3.6 11/08/2023   HGB 13.7 11/08/2023   HCT 42.1 11/08/2023   MCV 83.7 11/08/2023   PLT 224 11/08/2023      Chemistry      Component Value Date/Time   NA 138 11/08/2023 1006   K 3.7 11/08/2023 1006   CL 104 11/08/2023 1006   CO2 28 11/08/2023 1006   BUN 20 11/08/2023 1006   CREATININE 1.49 (H) 11/08/2023 1006      Component Value Date/Time   CALCIUM 10.2 11/08/2023 1006   ALKPHOS 81 11/08/2023 1006   AST 19 11/08/2023 1006   ALT 27 11/08/2023 1006   BILITOT 0.3 11/08/2023 1006       RADIOGRAPHIC STUDIES: I have personally reviewed the radiological images as listed and agreed with the findings in the report. MR Brain W Wo Contrast Result Date: 10/23/2023 CLINICAL DATA:  Kidney cancer staging. EXAM: MRI HEAD WITHOUT AND WITH CONTRAST TECHNIQUE: Multiplanar, multiecho pulse sequences of the brain and surrounding structures were obtained without and with intravenous contrast. CONTRAST:  10mL GADAVIST GADOBUTROL 1 MMOL/ML IV SOLN COMPARISON:  06/26/2015 FINDINGS: Brain: 24 enhancing brain lesions consistent with metastatic disease. These are marked on axial postcontrast images, series 20. The largest is at the superior left frontal lobe measuring 12 mm. No significant associated brain edema. There are scattered simple cystic intensity structures in the brain most consistent with dilated perivascular spaces. No superimposed infarct, acute hemorrhage, hydrocephalus, or collection. Vascular: Major flow voids and vascular enhancements are preserved Skull and upper cervical spine: No focal marrow lesion is seen. Sinuses/Orbits: Negative Other: Bulbous masslike area in the left cheek measuring 6 cm in length, suspect this is in the oral cavity rather than a submucosal mass. IMPRESSION: 1. 24 brain masses consistent with metastatic disease. The largest measures 12 mm and there is no associated  mass effect or significant edema. 2. 6  cm masslike area in the left cheek, favored to be intraoral chew, but correlate with exam Electronically Signed   By: Tiburcio Pea M.D.   On: 10/23/2023 07:56   MR Abdomen W Wo Contrast Result Date: 10/19/2023 CLINICAL DATA:  Status post right nephrectomy for renal cell carcinoma, abnormal soft tissue suspected in the nephrectomy bed identified by prior CT EXAM: MRI ABDOMEN WITHOUT AND WITH CONTRAST TECHNIQUE: Multiplanar multisequence MR imaging of the abdomen was performed both before and after the administration of intravenous contrast. CONTRAST:  10mL GADAVIST GADOBUTROL 1 MMOL/ML IV SOLN COMPARISON:  CT chest abdomen pelvis, 09/13/2023 FINDINGS: Lower chest: No acute abnormality. Multiple small pulmonary nodules in the bilateral lung bases, better assessed by CT (series 11, image 24). Hepatobiliary: No solid liver abnormality is seen. No gallstones, gallbladder wall thickening, or biliary dilatation. Pancreas: Unilocular fluid signal cystic lesion within the pancreatic tail measuring 1.2 x 1.1 cm (series 18, image 17). No pancreatic ductal dilatation or surrounding inflammatory changes. Spleen: Normal in size without significant abnormality. Adrenals/Urinary Tract: Adrenal glands are unremarkable. Enhancing soft tissue nodule of the superior aspect of the right nephrectomy bed closely abutting the underlying right psoas muscle, measuring 1.6 x 0.9 cm (series 11, image 42). Additional small enhancing nodule in the superior hepatorenal fossa measuring 0.7 x 0.7 cm (series 11, image 48). The left kidney is normal, without renal calculi, solid lesion, or hydronephrosis. Stomach/Bowel: Stomach is within normal limits. No evidence of bowel wall thickening, distention, or inflammatory changes. Vascular/Lymphatic: No significant vascular findings are present. No enlarged abdominal lymph nodes. Other: No abdominal wall hernia or abnormality. No ascites. Musculoskeletal: No acute or significant osseous findings.  IMPRESSION: 1. Enhancing soft tissue nodule in the superior aspect of the right nephrectomy bed closely abutting the underlying right psoas muscle, measuring 1.6 x 0.9 cm. Additional small enhancing nodule in the superior hepatorenal fossa measuring 0.7 x 0.7 cm. Findings are highly suspicious for locally recurrent renal cell carcinoma, particularly in the setting of presumed pulmonary metastases. 2. Multiple small pulmonary nodules in the bilateral lung bases, better assessed by CT and highly suspicious for pulmonary metastases. 3. 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 In the setting of other known and presumed metastatic malignancy, no specific further follow-up or characterization is required. Electronically Signed   By: Jearld Lesch M.D.   On: 10/19/2023 10:57

## 2023-11-07 NOTE — Assessment & Plan Note (Signed)
  Drug monitoring for lenvatinib. Monitor for hypertension Baseline EKG next visit LFT, renal function, electrolytes, every 2 weeks for 2 months and then monthly thereafter Baseline TSH and free T4 and then monthly urine dipstick; if 2+ then obtain a 24-hour urine protein Dental exam before and periodically Monitor for signs of bleeding, wound healing, thrombosis Follow up one week after starting lenvatinib.

## 2023-11-07 NOTE — Assessment & Plan Note (Addendum)
Will start lenvatinib with pembrolizumab today Ordering lenvatinib 20 mg daily Monitor for toxicity CT CAP in about 3 months MRI in about 2 month after starting treatment SRS if needed after systemic treatment

## 2023-11-08 ENCOUNTER — Inpatient Hospital Stay: Payer: Medicare Other

## 2023-11-08 ENCOUNTER — Other Ambulatory Visit: Payer: Self-pay

## 2023-11-08 ENCOUNTER — Other Ambulatory Visit (HOSPITAL_COMMUNITY): Payer: Self-pay

## 2023-11-08 VITALS — BP 140/85 | HR 64 | Resp 17

## 2023-11-08 VITALS — BP 132/77 | HR 65 | Temp 97.7°F | Resp 18 | Wt 224.9 lb

## 2023-11-08 DIAGNOSIS — E785 Hyperlipidemia, unspecified: Secondary | ICD-10-CM | POA: Diagnosis not present

## 2023-11-08 DIAGNOSIS — C641 Malignant neoplasm of right kidney, except renal pelvis: Secondary | ICD-10-CM

## 2023-11-08 DIAGNOSIS — Z79899 Other long term (current) drug therapy: Secondary | ICD-10-CM | POA: Diagnosis not present

## 2023-11-08 DIAGNOSIS — Z9221 Personal history of antineoplastic chemotherapy: Secondary | ICD-10-CM | POA: Diagnosis not present

## 2023-11-08 DIAGNOSIS — K862 Cyst of pancreas: Secondary | ICD-10-CM | POA: Diagnosis not present

## 2023-11-08 DIAGNOSIS — Z905 Acquired absence of kidney: Secondary | ICD-10-CM | POA: Diagnosis not present

## 2023-11-08 DIAGNOSIS — C7931 Secondary malignant neoplasm of brain: Secondary | ICD-10-CM | POA: Diagnosis not present

## 2023-11-08 DIAGNOSIS — I159 Secondary hypertension, unspecified: Secondary | ICD-10-CM

## 2023-11-08 DIAGNOSIS — E119 Type 2 diabetes mellitus without complications: Secondary | ICD-10-CM | POA: Diagnosis not present

## 2023-11-08 DIAGNOSIS — R918 Other nonspecific abnormal finding of lung field: Secondary | ICD-10-CM | POA: Diagnosis not present

## 2023-11-08 DIAGNOSIS — Z5112 Encounter for antineoplastic immunotherapy: Secondary | ICD-10-CM | POA: Diagnosis not present

## 2023-11-08 DIAGNOSIS — C61 Malignant neoplasm of prostate: Secondary | ICD-10-CM | POA: Diagnosis not present

## 2023-11-08 DIAGNOSIS — I10 Essential (primary) hypertension: Secondary | ICD-10-CM | POA: Diagnosis not present

## 2023-11-08 DIAGNOSIS — Z7984 Long term (current) use of oral hypoglycemic drugs: Secondary | ICD-10-CM | POA: Diagnosis not present

## 2023-11-08 DIAGNOSIS — Z9189 Other specified personal risk factors, not elsewhere classified: Secondary | ICD-10-CM | POA: Diagnosis not present

## 2023-11-08 DIAGNOSIS — J984 Other disorders of lung: Secondary | ICD-10-CM | POA: Diagnosis not present

## 2023-11-08 LAB — CBC WITH DIFFERENTIAL (CANCER CENTER ONLY)
Abs Immature Granulocytes: 0.01 10*3/uL (ref 0.00–0.07)
Basophils Absolute: 0 10*3/uL (ref 0.0–0.1)
Basophils Relative: 1 %
Eosinophils Absolute: 0.1 10*3/uL (ref 0.0–0.5)
Eosinophils Relative: 2 %
HCT: 42.1 % (ref 39.0–52.0)
Hemoglobin: 13.7 g/dL (ref 13.0–17.0)
Immature Granulocytes: 0 %
Lymphocytes Relative: 25 %
Lymphs Abs: 1.4 10*3/uL (ref 0.7–4.0)
MCH: 27.2 pg (ref 26.0–34.0)
MCHC: 32.5 g/dL (ref 30.0–36.0)
MCV: 83.7 fL (ref 80.0–100.0)
Monocytes Absolute: 0.5 10*3/uL (ref 0.1–1.0)
Monocytes Relative: 8 %
Neutro Abs: 3.6 10*3/uL (ref 1.7–7.7)
Neutrophils Relative %: 64 %
Platelet Count: 224 10*3/uL (ref 150–400)
RBC: 5.03 MIL/uL (ref 4.22–5.81)
RDW: 15.6 % — ABNORMAL HIGH (ref 11.5–15.5)
WBC Count: 5.6 10*3/uL (ref 4.0–10.5)
nRBC: 0 % (ref 0.0–0.2)

## 2023-11-08 LAB — LACTATE DEHYDROGENASE: LDH: 114 U/L (ref 98–192)

## 2023-11-08 LAB — CMP (CANCER CENTER ONLY)
ALT: 27 U/L (ref 0–44)
AST: 19 U/L (ref 15–41)
Albumin: 4.5 g/dL (ref 3.5–5.0)
Alkaline Phosphatase: 81 U/L (ref 38–126)
Anion gap: 6 (ref 5–15)
BUN: 20 mg/dL (ref 8–23)
CO2: 28 mmol/L (ref 22–32)
Calcium: 10.2 mg/dL (ref 8.9–10.3)
Chloride: 104 mmol/L (ref 98–111)
Creatinine: 1.49 mg/dL — ABNORMAL HIGH (ref 0.61–1.24)
GFR, Estimated: 49 mL/min — ABNORMAL LOW (ref >60)
Glucose, Bld: 91 mg/dL (ref 70–99)
Potassium: 3.7 mmol/L (ref 3.5–5.1)
Sodium: 138 mmol/L (ref 135–145)
Total Bilirubin: 0.3 mg/dL (ref <1.2)
Total Protein: 8.7 g/dL — ABNORMAL HIGH (ref 6.5–8.1)

## 2023-11-08 LAB — TSH: TSH: 1.155 u[IU]/mL (ref 0.350–4.500)

## 2023-11-08 MED ORDER — SODIUM CHLORIDE 0.9 % IV SOLN: INTRAVENOUS | Status: DC

## 2023-11-08 MED ORDER — SODIUM CHLORIDE 0.9 % IV SOLN
200.0000 mg | Freq: Once | INTRAVENOUS | Status: AC
Start: 2023-11-08 — End: 2023-11-08
  Administered 2023-11-08: 200 mg via INTRAVENOUS
  Filled 2023-11-08: qty 200

## 2023-11-08 NOTE — Patient Instructions (Signed)

## 2023-11-08 NOTE — Assessment & Plan Note (Signed)
Monitor BP at rest at home. Continue amlodipine 5 mg daily, Prinzide 20-25 mg daily

## 2023-11-09 ENCOUNTER — Telehealth: Payer: Self-pay

## 2023-11-09 LAB — T4: T4, Total: 5.9 ug/dL (ref 4.5–12.0)

## 2023-11-09 NOTE — Telephone Encounter (Signed)
-----   Message from Nurse Ashby Dawes sent at 11/08/2023  1:08 PM EST ----- Regarding: First time/ Keytruda/ Dr Cherly Hensen pt Pt had first time [facility] today. Tolerated well.  Thanks!

## 2023-11-09 NOTE — Telephone Encounter (Signed)
[name] states that he is doing fine. He is eating, drinking, and urinating well. He knows to call the office at [phone] if he has any questions or concerns.

## 2023-11-15 NOTE — Assessment & Plan Note (Addendum)
Monitor BP at rest at home. Increase amlodipine to 10 mg daily continue Prinzide 20-25 mg daily

## 2023-11-15 NOTE — Assessment & Plan Note (Signed)
Drug monitoring for lenvatinib. Monitor for hypertension with baseline hypertension Baseline EKG normal today. LFT, renal function, electrolytes, every 2 weeks for 2 months and then monthly thereafter Baseline TSH and free T4 and then monthly urine dipstick; if 2+ then obtain a 24-hour urine protein Dental exam before and periodically Monitor for signs of bleeding, wound healing, thrombosis Follow up one week after starting lenvatinib.

## 2023-11-15 NOTE — Assessment & Plan Note (Signed)
Continue lenvatinib with pembrolizumab  lenvatinib 20 mg daily Monitor for toxicity CT CAP in about 3 months MRI in about 2 month after starting treatment SRS if needed after systemic treatment

## 2023-11-15 NOTE — Progress Notes (Signed)
Patient Care Team: [name], MD as PCP - General (Family Medicine)  Clinic Day:  11/16/2023  Referring physician: [name], MD  ASSESSMENT & PLAN:   Assessment & Plan: [name] is a 77 y.o. 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 Currently with stage IV RCC. Staging imaging show brain metastases, and pulmonary nodules suggestive of lung metastases.   Most recent PSA 0.022 on 09/10/23.   Current diagnosis: stage IV RCC with brain metastases Initial diagnosis: 10/2022. pT3cN0M0 G3 right papillary RCC, type 2 Previous Treatment: 10/27/22 Open radical nephrectomy with IVC repair, cardiac bypass for level 3 thrombus at [facility] Renal function: 10/09/23 Cr. 1.53. Labs: Ca 10.2. WBC 6.7. HGB 13.4 PLT 221. ANC 4.4   IMDC risk: intermediate 1 risk factor  Current treatment: 1st line 11/09/23. Len/pembro  Renal cell carcinoma, right (HCC) Continue lenvatinib with pembrolizumab  lenvatinib 20 mg daily Monitor for toxicity CT CAP in about 3 months MRI in about 2 month after starting treatment SRS if needed after systemic treatment  At risk for side effect of medication Drug monitoring for lenvatinib. Monitor for hypertension with baseline hypertension Baseline EKG normal today. LFT, renal function, electrolytes, every 2 weeks for 2 months and then monthly thereafter Baseline TSH and free T4 and then monthly urine dipstick; if 2+ then obtain a 24-hour urine protein Dental exam before and periodically Monitor for signs of bleeding, wound healing, thrombosis Follow up one week after starting lenvatinib.  HTN (hypertension) Monitor BP at rest at home. Increase amlodipine to 10 mg daily continue Prinzide 20-25 mg daily   Keep appt on 1/2 The patient understands the plans discussed today and is in agreement with them.  He  knows to contact our office if he develops concerns prior to his next appointment.  [name], MD  [facility] - A DEPT OF [facility] [address] [place] [address] Dept: [phone] Dept Fax: [phone]   No orders of the defined types were placed in this encounter.     CHIEF COMPLAINT:  CC: mRCC  Current Treatment:  len/pembro  INTERVAL HISTORY:  Rone is here today for repeat clinical assessment. He is with Hilda Lias. Report some nagging headache comes and goes. No associated nausea, vomiting, focal weakness, visual change. His appetite is good. No imbalance.  BP at home 120's to 150's SBP. No chest pain, pressure, short of breath.  No abdominal pain, nausea, vomiting, diarrhea, mouth sore or pain.  No signs of rash.  No difficulty urinating  No palpitation  No edema, leg swelling, focal weakness, bleeding, wound.  Dental issue: none   I have reviewed the past medical history, past surgical history, social history and family history with the patient and they are unchanged from previous note.  ALLERGIES:  is allergic to bee venom, aspirin, and penicillins.  MEDICATIONS:  Current Outpatient Medications  Medication Sig Dispense Refill   amLODipine (NORVASC) 10 MG tablet Take 1 tablet (10 mg total) by mouth daily. 90 tablet 1   EPINEPHrine 0.3 mg/0.3 mL IJ SOAJ injection epinephrine 0.3 mg/0.3 mL injection, auto-injector     finasteride (PROSCAR) 5 MG tablet Take 5 mg by mouth daily.     Grape Seed Extract 100 MG CAPS Take 300 mg by mouth. 300  mg     latanoprost (XALATAN) 0.005 % ophthalmic solution 1 drop at bedtime.     lenvatinib 20 mg daily dose (LENVIMA) 2 x 10 MG capsule Take 2 capsules (20 mg total) by mouth daily. 60 capsule 11   lisinopril-hydrochlorothiazide (PRINZIDE,ZESTORETIC) 20-25 MG per tablet Take 1 tablet by mouth every morning.   5   metformin (FORTAMET) 500 MG (OSM) 24 hr  tablet Take 500 mg by mouth daily with breakfast.     Multiple Vitamins-Minerals (MULTIVITAMIN WITH MINERALS) tablet Take 1 tablet by mouth daily.     ondansetron (ZOFRAN) 8 MG tablet Take 1 tablet (8 mg total) by mouth every 8 (eight) hours as needed for nausea or vomiting. 30 tablet 1   OVER THE COUNTER MEDICATION Lion's mane supplement     prochlorperazine (COMPAZINE) 10 MG tablet Take 1 tablet (10 mg total) by mouth every 6 (six) hours as needed for nausea or vomiting. 30 tablet 1   tamsulosin (FLOMAX) 0.4 MG CAPS capsule Take 0.4 mg by mouth daily after breakfast.      No current facility-administered medications for this visit.    HISTORY OF PRESENT ILLNESS:   Oncology History  Malignant neoplasm of prostate (HCC)  04/01/2018 Initial Diagnosis   Malignant neoplasm of prostate (HCC)   04/30/2018 Genetic Testing   Multi-Cancer panel (83 genes) @ [facility] - No pathogenic mutations detected Variants of Uncertain Significance in ATM, SDHA and TSC2  Genes Analyzed: 83 genes on [facility]'s Multi-Cancer panel (ALK, APC, ATM, AXIN2, BAP1, BARD1, BLM, BMPR1A, BRCA1, BRCA2, BRIP1, CASR, CDC73, CDH1, CDK4, CDKN1B, CDKN1C, CDKN2A, CEBPA, CHEK2, CTNNA1, DICER1, DIS3L2, EGFR, EPCAM, FH, FLCN, GATA2, GPC3, GREM1, HOXB13, HRAS, KIT, MAX, MEN1, MET, MITF, MLH1, MSH2, MSH3, MSH6, MUTYH, NBN, NF1, NF2, NTHL1, PALB2, PDGFRA, PHOX2B, PMS2, POLD1, POLE, POT1, PRKAR1A, PTCH1, PTEN, RAD50, RAD51C, RAD51D, RB1, RECQL4, RET, RUNX1, SDHA, SDHAF2, SDHB, SDHC, SDHD, SMAD4, SMARCA4, SMARCB1, SMARCE1, STK11, SUFU, TERC, TERT, TMEM127, TP53, TSC1, TSC2, VHL, WRN, WT1).  UPDATE: ATM [id number] VUS was amended to "Likely Benign" due to a re-review of the evidence in light of new variant interpretation guidelines and/or new information.   Renal cell carcinoma, right (HCC)  09/29/2022 Imaging   CT chest 1.  No evidence of metastatic disease in the thorax.  2.  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 Consider correlation with genetic testing.  3.  Reference same day abdominal MRI for details regarding the right renal mass and associated tumor thrombus in the right renal vein and IVC    09/29/2022 Imaging   MRI 1.  Right interpolar renal 3.5 cm mass concerning for primary renal malignancy. Upper pole and lower pole tumor is favored to primarily be intravenous tumor thrombus, with intravenous tumor expanding the right renal vein, and the subdiaphragmatic IVC as detailed above. Differential includes renal cell carcinoma (favored) and transitional cell carcinoma.  2.  Please see outside 08/25/2022 CT abdomen and pelvis for evaluation of the collecting system on delayed/urographic phase.  3.  No definite lymphadenopathy.  4.  Signal changes in the L5 superior endplate are favored to reflect a fracture. However, recommend continued attention on follow-up.    10/27/2022 Pathology Results   SPECIMEN    Procedure:    Radical nephrectomy     Specimen Laterality:    Right   TUMOR    Tumor Focality:    Unifocal     Tumor Size:    Greatest Dimension (  Centimeters): 8.2 cm     Histologic Type:    Papillary renal cell carcinoma, type 2     Histologic Grade (WHO / ISUP):    G3 (nucleoli conspicuous and eosinophilic at 100x magnification)     Tumor Extent:    Extends into perinephric tissue (beyond renal capsule)     Tumor Extent:    Extends into renal sinus     Tumor Extent:    Extends into major vein (renal vein or its segmental branches, inferior vena cava)     Sarcomatoid Features:    Not identified     Rhabdoid Features:    Not identified     Tumor Necrosis:    Not identified     Lymphovascular Invasion:    Present   MARGINS    Margin Status:    Invasive carcinoma present at margin       Margin(s) Involved by Invasive Carcinoma:    Renal vein: present in renal vein nephrectomy margin; No tumor seen is separately submitted renal vein  segment.   REGIONAL LYMPH NODES     Regional Lymph Node Status:    Not applicable (no regional lymph nodes submitted or found)   PATHOLOGIC STAGE CLASSIFICATION (pTNM, AJCC 8th Edition)     Reporting of pT, pN, and (when applicable) pM categories is based on information available to the pathologist at the time the report is issued. As per the AJCC (Chapter 1, 8th Ed.) it is the managing physician's responsibility to establish the final  pathologic stage based upon all pertinent information, including but potentially not limited to this pathology report.     Primary Tumor (pT):    pT3b     Regional Lymph Nodes (pN):    pN not assigned (no nodes submitted or found)    03/08/2023 Imaging   CT CAP Status post radical right nephrectomy, IVC repair, and partial right adrenalectomy without evidence of recurrent or metastatic disease.  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  L5 compression deformity without progressive interval height loss  Lungs/Pleura: No large airspace consolidation or pleural effusion. Similar distribution of numerous thin-walled pulmonary cysts throughout the bilateral lungs. Bibasilar atelectasis/scarring. No new pulmonary nodules.    09/13/2023 Imaging   CT CAP 5 mm left lower lobe pulmonary nodule Solid upper lobe pulmonary nodule 3 mm 4 mm right lower lobe nodule Sclerotic focus T1 change from prior likely bone island.  No aggressive lytic lesion.  Multilevel degenerative changes of spine and bilateral shoulder Similar appearance of sclerotic superior endplate deformity at L5 unchanged dating back to September 2023.  New from April 2019. 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 No pathologically enlarged abdominal or pelvic lymph nodes.   10/08/2023 Initial Diagnosis   Renal cell carcinoma, right (HCC)   11/08/2023 -  Chemotherapy   Patient is on Treatment Plan : BLADDER Pembrolizumab (200) q21d         REVIEW OF SYSTEMS:   All relevant systems were reviewed with the patient and are negative.   VITALS:  Blood pressure (!) 149/77, pulse 64, temperature (!) 97.5 F (36.4 C), resp. rate 20, weight 224 lb 12.8 oz (102 kg), SpO2 100%.  Wt Readings from  Last 3 Encounters:  11/16/23 224 lb 12.8 oz (102 kg)  11/08/23 224 lb 14.4 oz (102 kg)  10/23/23 228 lb 3.2 oz (103.5 kg)    Body mass index is 28.1 kg/m.  Performance status (ECOG): 1 - Symptomatic but completely ambulatory  PHYSICAL EXAM:   GENERAL:alert, no distress and comfortable SKIN: skin color normal, no rashes  EYES: normal, sclera clear OROPHARYNX: no exudate, no erythema    NECK: supple,  non-tender, without nodularity LYMPH:  no palpable cervical lymphadenopathy LUNGS: clear to auscultation with normal breathing effort.  No wheeze or rales HEART: regular rate & rhythm and no murmurs and no lower extremity edema ABDOMEN: abdomen soft, non-tender and nondistended Musculoskeletal: no edema NEURO: alert, fluent speech, no focal motor/sensory deficits.  Strength and sensation equal bilaterally.  LABORATORY DATA:  I have reviewed the data as listed    Component Value Date/Time   NA 137 11/16/2023 0921   K 3.7 11/16/2023 0921   CL 100 11/16/2023 0921   CO2 30 11/16/2023 0921   GLUCOSE 107 (H) 11/16/2023 0921   BUN 17 11/16/2023 0921   CREATININE 1.61 (H) 11/16/2023 0921   CALCIUM 9.9 11/16/2023 0921   PROT 7.8 11/16/2023 0921   ALBUMIN 4.2 11/16/2023 0921   AST 17 11/16/2023 0921   ALT 26 11/16/2023 0921   ALKPHOS 91 11/16/2023 0921   BILITOT 0.4 11/16/2023 0921   GFRNONAA 44 (L) 11/16/2023 0921   GFRAA >60 04/16/2020 2121    No results  found for: "SPEP", "UPEP"  Lab Results  Component Value Date   WBC 5.6 11/08/2023   NEUTROABS 3.6 11/08/2023   HGB 13.7 11/08/2023   HCT 42.1 11/08/2023   MCV 83.7 11/08/2023   PLT 224 11/08/2023      Chemistry      Component Value Date/Time   NA 137 11/16/2023 0921   K 3.7 11/16/2023 0921   CL 100 11/16/2023 0921   CO2 30 11/16/2023 0921   BUN 17 11/16/2023 0921   CREATININE 1.61 (H) 11/16/2023 0921      Component Value Date/Time   CALCIUM 9.9 11/16/2023 0921   ALKPHOS 91 11/16/2023 0921   AST 17 11/16/2023 0921   ALT 26 11/16/2023 0921   BILITOT 0.4 11/16/2023 0921       RADIOGRAPHIC STUDIES: I have personally reviewed the radiological images as listed and agreed with the findings in the report. No results found.

## 2023-11-16 ENCOUNTER — Inpatient Hospital Stay: Payer: Medicare Other

## 2023-11-16 ENCOUNTER — Inpatient Hospital Stay (HOSPITAL_BASED_OUTPATIENT_CLINIC_OR_DEPARTMENT_OTHER): Payer: Medicare Other

## 2023-11-16 VITALS — BP 149/77 | HR 64 | Temp 97.5°F | Resp 20 | Wt 224.8 lb

## 2023-11-16 DIAGNOSIS — K862 Cyst of pancreas: Secondary | ICD-10-CM | POA: Diagnosis not present

## 2023-11-16 DIAGNOSIS — I159 Secondary hypertension, unspecified: Secondary | ICD-10-CM | POA: Diagnosis not present

## 2023-11-16 DIAGNOSIS — Z5112 Encounter for antineoplastic immunotherapy: Secondary | ICD-10-CM | POA: Diagnosis not present

## 2023-11-16 DIAGNOSIS — Z9189 Other specified personal risk factors, not elsewhere classified: Secondary | ICD-10-CM

## 2023-11-16 DIAGNOSIS — Z7984 Long term (current) use of oral hypoglycemic drugs: Secondary | ICD-10-CM | POA: Diagnosis not present

## 2023-11-16 DIAGNOSIS — E119 Type 2 diabetes mellitus without complications: Secondary | ICD-10-CM | POA: Diagnosis not present

## 2023-11-16 DIAGNOSIS — C61 Malignant neoplasm of prostate: Secondary | ICD-10-CM | POA: Diagnosis not present

## 2023-11-16 DIAGNOSIS — J984 Other disorders of lung: Secondary | ICD-10-CM | POA: Diagnosis not present

## 2023-11-16 DIAGNOSIS — R918 Other nonspecific abnormal finding of lung field: Secondary | ICD-10-CM | POA: Diagnosis not present

## 2023-11-16 DIAGNOSIS — C7931 Secondary malignant neoplasm of brain: Secondary | ICD-10-CM | POA: Diagnosis not present

## 2023-11-16 DIAGNOSIS — I10 Essential (primary) hypertension: Secondary | ICD-10-CM | POA: Diagnosis not present

## 2023-11-16 DIAGNOSIS — E785 Hyperlipidemia, unspecified: Secondary | ICD-10-CM | POA: Diagnosis not present

## 2023-11-16 DIAGNOSIS — C641 Malignant neoplasm of right kidney, except renal pelvis: Secondary | ICD-10-CM

## 2023-11-16 DIAGNOSIS — Z9221 Personal history of antineoplastic chemotherapy: Secondary | ICD-10-CM | POA: Diagnosis not present

## 2023-11-16 DIAGNOSIS — Z79899 Other long term (current) drug therapy: Secondary | ICD-10-CM | POA: Diagnosis not present

## 2023-11-16 DIAGNOSIS — Z905 Acquired absence of kidney: Secondary | ICD-10-CM | POA: Diagnosis not present

## 2023-11-16 LAB — CMP (CANCER CENTER ONLY)
ALT: 26 U/L (ref 0–44)
AST: 17 U/L (ref 15–41)
Albumin: 4.2 g/dL (ref 3.5–5.0)
Alkaline Phosphatase: 91 U/L (ref 38–126)
Anion gap: 7 (ref 5–15)
BUN: 17 mg/dL (ref 8–23)
CO2: 30 mmol/L (ref 22–32)
Calcium: 9.9 mg/dL (ref 8.9–10.3)
Chloride: 100 mmol/L (ref 98–111)
Creatinine: 1.61 mg/dL — ABNORMAL HIGH (ref 0.61–1.24)
GFR, Estimated: 44 mL/min — ABNORMAL LOW (ref >60)
Glucose, Bld: 107 mg/dL — ABNORMAL HIGH (ref 70–99)
Potassium: 3.7 mmol/L (ref 3.5–5.1)
Sodium: 137 mmol/L (ref 135–145)
Total Bilirubin: 0.4 mg/dL (ref <1.2)
Total Protein: 7.8 g/dL (ref 6.5–8.1)

## 2023-11-16 LAB — MAGNESIUM: Magnesium: 2.1 mg/dL (ref 1.7–2.4)

## 2023-11-16 MED ORDER — AMLODIPINE BESYLATE 10 MG PO TABS: 10.0000 mg | ORAL_TABLET | Freq: Every day | ORAL | 1 refills | Status: DC

## 2023-11-21 ENCOUNTER — Other Ambulatory Visit: Payer: Self-pay

## 2023-11-27 ENCOUNTER — Other Ambulatory Visit (HOSPITAL_COMMUNITY): Payer: Self-pay

## 2023-11-27 NOTE — Progress Notes (Signed)
 Specialty Pharmacy Ongoing Clinical Assessment Note  [name] is a 77 y.o. male who is being followed by the specialty pharmacy service for RxSp Oncology   Patient's specialty medication(s) reviewed today: Lenvatinib  Mesylate (LENVIMA )   Missed doses in the last 4 weeks: 0   Patient/Caregiver did not have any additional questions or concerns.   Therapeutic benefit summary: Patient is achieving benefit   Adverse events/side effects summary: Experienced adverse events/side effects (slight headache, tolerable and occasional, tolerable consitpation, pt is taking Miralax )   Patient's therapy is appropriate to: Continue    Goals Addressed             This Visit's Progress    Maintain optimal adherence to therapy   No change    Patient is initiating therapy. Patient will maintain adherence         Follow up:  3 months  [name] Specialty Pharmacist

## 2023-11-27 NOTE — Progress Notes (Signed)
 Specialty Pharmacy Refill Coordination Note  [name] is a 77 y.o. male contacted today regarding refills of specialty medication(s) Lenvatinib  Mesylate (LENVIMA )   Patient requested Delivery   Delivery date: 12/04/23   Verified address: [address]  [place] [address]   Medication will be filled on 12/03/23.

## 2023-11-29 ENCOUNTER — Inpatient Hospital Stay: Payer: Medicare PPO

## 2023-11-29 VITALS — BP 152/84 | HR 67 | Temp 97.5°F | Resp 15 | Wt 224.3 lb

## 2023-11-29 DIAGNOSIS — C7931 Secondary malignant neoplasm of brain: Secondary | ICD-10-CM | POA: Insufficient documentation

## 2023-11-29 DIAGNOSIS — Z905 Acquired absence of kidney: Secondary | ICD-10-CM | POA: Insufficient documentation

## 2023-11-29 DIAGNOSIS — Z5112 Encounter for antineoplastic immunotherapy: Secondary | ICD-10-CM | POA: Insufficient documentation

## 2023-11-29 DIAGNOSIS — C641 Malignant neoplasm of right kidney, except renal pelvis: Secondary | ICD-10-CM

## 2023-11-29 DIAGNOSIS — E1122 Type 2 diabetes mellitus with diabetic chronic kidney disease: Secondary | ICD-10-CM | POA: Insufficient documentation

## 2023-11-29 DIAGNOSIS — I129 Hypertensive chronic kidney disease with stage 1 through stage 4 chronic kidney disease, or unspecified chronic kidney disease: Secondary | ICD-10-CM | POA: Diagnosis not present

## 2023-11-29 DIAGNOSIS — R519 Headache, unspecified: Secondary | ICD-10-CM | POA: Diagnosis not present

## 2023-11-29 DIAGNOSIS — E785 Hyperlipidemia, unspecified: Secondary | ICD-10-CM | POA: Diagnosis not present

## 2023-11-29 DIAGNOSIS — G4733 Obstructive sleep apnea (adult) (pediatric): Secondary | ICD-10-CM | POA: Diagnosis not present

## 2023-11-29 DIAGNOSIS — Z79899 Other long term (current) drug therapy: Secondary | ICD-10-CM | POA: Diagnosis not present

## 2023-11-29 DIAGNOSIS — N1831 Chronic kidney disease, stage 3a: Secondary | ICD-10-CM | POA: Diagnosis not present

## 2023-11-29 DIAGNOSIS — N189 Chronic kidney disease, unspecified: Secondary | ICD-10-CM | POA: Insufficient documentation

## 2023-11-29 DIAGNOSIS — Z7984 Long term (current) use of oral hypoglycemic drugs: Secondary | ICD-10-CM | POA: Insufficient documentation

## 2023-11-29 DIAGNOSIS — C61 Malignant neoplasm of prostate: Secondary | ICD-10-CM | POA: Diagnosis present

## 2023-11-29 DIAGNOSIS — I159 Secondary hypertension, unspecified: Secondary | ICD-10-CM

## 2023-11-29 DIAGNOSIS — Z9189 Other specified personal risk factors, not elsewhere classified: Secondary | ICD-10-CM | POA: Diagnosis not present

## 2023-11-29 LAB — CMP (CANCER CENTER ONLY)
ALT: 39 U/L (ref 0–44)
AST: 20 U/L (ref 15–41)
Albumin: 4.2 g/dL (ref 3.5–5.0)
Alkaline Phosphatase: 102 U/L (ref 38–126)
Anion gap: 7 (ref 5–15)
BUN: 19 mg/dL (ref 8–23)
CO2: 28 mmol/L (ref 22–32)
Calcium: 9.8 mg/dL (ref 8.9–10.3)
Chloride: 101 mmol/L (ref 98–111)
Creatinine: 1.51 mg/dL — ABNORMAL HIGH (ref 0.61–1.24)
GFR, Estimated: 48 mL/min — ABNORMAL LOW (ref >60)
Glucose, Bld: 123 mg/dL — ABNORMAL HIGH (ref 70–99)
Potassium: 3.5 mmol/L (ref 3.5–5.1)
Sodium: 136 mmol/L (ref 135–145)
Total Bilirubin: 0.4 mg/dL (ref 0.0–1.2)
Total Protein: 8.7 g/dL — ABNORMAL HIGH (ref 6.5–8.1)

## 2023-11-29 LAB — CBC WITH DIFFERENTIAL (CANCER CENTER ONLY)
Abs Immature Granulocytes: 0.01 10*3/uL (ref 0.00–0.07)
Basophils Absolute: 0 10*3/uL (ref 0.0–0.1)
Basophils Relative: 0 %
Eosinophils Absolute: 0.1 10*3/uL (ref 0.0–0.5)
Eosinophils Relative: 2 %
HCT: 43.1 % (ref 39.0–52.0)
Hemoglobin: 14.2 g/dL (ref 13.0–17.0)
Immature Granulocytes: 0 %
Lymphocytes Relative: 24 %
Lymphs Abs: 1.6 10*3/uL (ref 0.7–4.0)
MCH: 26.6 pg (ref 26.0–34.0)
MCHC: 32.9 g/dL (ref 30.0–36.0)
MCV: 80.9 fL (ref 80.0–100.0)
Monocytes Absolute: 0.4 10*3/uL (ref 0.1–1.0)
Monocytes Relative: 7 %
Neutro Abs: 4.6 10*3/uL (ref 1.7–7.7)
Neutrophils Relative %: 67 %
Platelet Count: 214 10*3/uL (ref 150–400)
RBC: 5.33 MIL/uL (ref 4.22–5.81)
RDW: 14.9 % (ref 11.5–15.5)
WBC Count: 6.7 10*3/uL (ref 4.0–10.5)
nRBC: 0 % (ref 0.0–0.2)

## 2023-11-29 LAB — LACTATE DEHYDROGENASE: LDH: 153 U/L (ref 98–192)

## 2023-11-29 MED ORDER — SODIUM CHLORIDE 0.9 % IV SOLN
200.0000 mg | Freq: Once | INTRAVENOUS | Status: AC
  Administered 2023-11-29: 200 mg via INTRAVENOUS
  Filled 2023-11-29: qty 200

## 2023-11-29 MED ORDER — SODIUM CHLORIDE 0.9 % IV SOLN: INTRAVENOUS | Status: DC

## 2023-11-29 NOTE — Assessment & Plan Note (Signed)
 Monitor BP in relation to cancer therapy of lenvatinib. Continue amlodipine to 10 mg daily continue Prinzide 20-25 mg daily

## 2023-11-29 NOTE — Assessment & Plan Note (Signed)
 Intense drug monitoring for lenvatinib. Monitor for hypertension with baseline hypertension Baseline EKG normal repeat as indicated Check lab with LFT, renal function, electrolytes, every 2 weeks for 2 months and then monthly thereafter Check TSH and free T4 monthly urine dipstick; if 2+ then obtain a 24-hour urine protein Dental exam periodically Monitor for signs of bleeding, wound healing, thrombosis Follow up 2 weeks on lenvatinib.

## 2023-11-29 NOTE — Patient Instructions (Signed)

## 2023-11-29 NOTE — Assessment & Plan Note (Addendum)
 Continue lenvatinib with pembrolizumab  lenvatinib 20 mg daily Monitor for toxicity CT CAP ordered for mid Feb MRI of brain ordered SRS if needed after systemic treatment

## 2023-11-29 NOTE — Progress Notes (Signed)
 Patient seen by  Dr Tina Moccasin are within treatment parameters.  Labs reviewed: and are not all within treatment parameters.    Dr Tina aware CR: 1.51,   Per physician team, patient is ready for treatment and there are NO modifications to the treatment plan.

## 2023-11-29 NOTE — Assessment & Plan Note (Addendum)
 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

## 2023-11-29 NOTE — Progress Notes (Signed)
 Patient Care Team: Seabron Lenis, MD as PCP - General (Family Medicine)  Clinic Day:  11/29/2023  Referring physician: Tina Pauletta BROCKS, MD  ASSESSMENT & PLAN:   Assessment & Plan:  [name] is a 78 y.o. male with history of DM, HTN, HLD, OSA, and prostate cancer (T1c GG2 GS 3+4) s/p EBRT alone in 2019 and Right papillary RCC s/p nephrectomy in 10/2022 referred to Medical Oncology Clinic for [facility]. Currently with stage IV RCC. Staging imaging show brain metastases, and pulmonary nodules suggestive of lung metastases.   Most recent PSA 0.022 on 09/10/23.   Current diagnosis: stage IV RCC with brain metastases Initial diagnosis: 10/2022. pT3cN0M0 G3 right papillary RCC, type 2 Previous Treatment: 10/27/22 Open radical nephrectomy with IVC repair, cardiac bypass for level 3 thrombus at [facility] Renal function: 10/09/23 Cr. 1.53. Labs: Ca 10.2. WBC 6.7. HGB 13.4 PLT 221. ANC 4.4   IMDC risk: intermediate 1 risk factor   Current treatment: 1st line 11/09/23. Len/pembro  Cycle 2 today. He gets call for delivery of lenvatinib .  Brain metastases Will repeat MRI in Feb to assess treatment response. Ordering MRI today.  Renal cell carcinoma, right (HCC) Continue lenvatinib  with pembrolizumab   lenvatinib  20 mg daily Monitor for toxicity CT CAP ordered for mid Feb MRI of brain ordered SRS if needed after systemic treatment  At risk for side effect of medication Intense drug monitoring for lenvatinib . Monitor for hypertension with baseline hypertension Baseline EKG normal repeat as indicated Check lab with LFT, renal function, electrolytes, every 2 weeks for 2 months and then monthly thereafter Check TSH and free T4 monthly urine dipstick; if 2+ then obtain a 24-hour urine protein Dental exam periodically Monitor for signs of bleeding, wound healing, thrombosis Follow up 2 weeks on lenvatinib .  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 Continue amlodipine  to 10 mg daily continue Prinzide  20-25 mg daily    The patient understands the plans discussed today and is in agreement with them.  He knows to contact our office if he develops concerns prior to his next appointment.  Pauletta BROCKS Tina, MD  [facility] - A DEPT [facility] [address] [place] [address] Dept: [phone] Dept Fax: [phone]   Orders Placed This Encounter  Procedures   CT CHEST ABDOMEN PELVIS W CONTRAST    Standing Status:   Future    Expected Date:   01/17/2024    Expiration Date:   11/28/2024    If indicated for the ordered procedure, I authorize the administration of contrast media per Radiology protocol:   Yes    Does the patient have a contrast media/X-ray dye allergy?:   No    Preferred imaging location?:   [facility]    If indicated for the ordered procedure, I authorize the administration of oral contrast media per Radiology protocol:   Yes   MR Brain W Wo Contrast    Standing Status:   Future    Expected Date:   01/07/2024    Expiration Date:   11/28/2024    If indicated for the ordered procedure, I authorize the administration of contrast media per Radiology protocol:   Yes    What is the patient's sedation requirement?:   No Sedation    Does the patient have a pacemaker or implanted devices?:  No    Use SRS Protocol?:   Yes    Preferred imaging location?:   [facility] (table limit - 550 lbs)   Ambulatory referral to Audiology    Referral Priority:   Routine    Referral Type:   Audiology Exam    Referral Reason:   Specialty Services Required    Number of Visits Requested:   1      CHIEF COMPLAINT:  CC: stage IV RCC  Current Treatment:  len/pembro  INTERVAL HISTORY:  [name] is here today for repeat clinical assessment. No missing doses on his lenvatinib .  BP at  home mostly <140 SBP. Highest 149 SBP.  Slight headaches. Improved. No visual change or double vision.  A few blood clot when blowing nose.  No edema, chest pain, short of breath, coughing, focal weakness, diarrhea, and open wound.  No difficulty urinating  No palpitation.  Dental issue  I have reviewed the past medical history, past surgical history, social history and family history with the patient and they are unchanged from previous note.  ALLERGIES:  is allergic to bee venom, aspirin, and penicillins.  MEDICATIONS:  Current Outpatient Medications  Medication Sig Dispense Refill   amLODipine  (NORVASC ) 10 MG tablet Take 1 tablet (10 mg total) by mouth daily. 90 tablet 1   EPINEPHrine  0.3 mg/0.3 mL IJ SOAJ injection epinephrine  0.3 mg/0.3 mL injection, auto-injector     finasteride  (PROSCAR ) 5 MG tablet Take 5 mg by mouth daily.     Grape Seed Extract 100 MG CAPS Take 300 mg by mouth. 300 mg     latanoprost  (XALATAN ) 0.005 % ophthalmic solution 1 drop at bedtime.     lenvatinib  20 mg daily dose (LENVIMA ) 2 x 10 MG capsule Take 2 capsules (20 mg total) by mouth daily. 60 capsule 11   lisinopril -hydrochlorothiazide  (PRINZIDE ,ZESTORETIC ) 20-25 MG per tablet Take 1 tablet by mouth every morning.   5   metformin (FORTAMET) 500 MG (OSM) 24 hr tablet Take 500 mg by mouth daily with breakfast.     Multiple Vitamins-Minerals (MULTIVITAMIN WITH MINERALS) tablet Take 1 tablet by mouth daily.     ondansetron  (ZOFRAN ) 8 MG tablet Take 1 tablet (8 mg total) by mouth every 8 (eight) hours as needed for nausea or vomiting. 30 tablet 1   OVER THE COUNTER MEDICATION Lion's mane supplement     prochlorperazine  (COMPAZINE ) 10 MG tablet Take 1 tablet (10 mg total) by mouth every 6 (six) hours as needed for nausea or vomiting. 30 tablet 1   tamsulosin  (FLOMAX ) 0.4 MG CAPS capsule Take 0.4 mg by mouth daily after breakfast.      No current facility-administered medications for this visit.    Facility-Administered Medications Ordered in Other Visits  Medication Dose Route Frequency Provider Last Rate Last Admin   0.9 %  sodium chloride  infusion   Intravenous Continuous Tina Pauletta BROCKS, MD        HISTORY OF PRESENT ILLNESS:   Oncology History  Malignant neoplasm of prostate (HCC)  04/01/2018 Initial Diagnosis   Malignant neoplasm of prostate (HCC)   04/30/2018 Genetic Testing   Multi-Cancer panel (83 genes) @ [facility] - No pathogenic mutations detected Variants of Uncertain Significance in ATM, SDHA and TSC2  Genes Analyzed: 83 genes on [facility]'s Multi-Cancer panel (ALK, APC, ATM, AXIN2, BAP1, BARD1, BLM, BMPR1A, BRCA1, BRCA2, BRIP1, CASR, CDC73, CDH1, CDK4, CDKN1B, CDKN1C, CDKN2A, CEBPA, CHEK2, CTNNA1, DICER1, DIS3L2, EGFR, EPCAM, FH, FLCN, GATA2, GPC3, GREM1, HOXB13, HRAS, KIT, MAX, MEN1,  MET, MITF, MLH1, MSH2, MSH3, MSH6, MUTYH, NBN, NF1, NF2, NTHL1, PALB2, PDGFRA, PHOX2B, PMS2, POLD1, POLE, POT1, PRKAR1A, PTCH1, PTEN, RAD50, RAD51C, RAD51D, RB1, RECQL4, RET, RUNX1, SDHA, SDHAF2, SDHB, SDHC, SDHD, SMAD4, SMARCA4, SMARCB1, SMARCE1, STK11, SUFU, TERC, TERT, TMEM127, TP53, TSC1, TSC2, VHL, WRN, WT1).  UPDATE: ATM [id number] VUS was amended to Likely Benign due to a re-review of the evidence in light of new variant interpretation guidelines and/or new information.   Renal cell carcinoma, right (HCC)  09/29/2022 Imaging   CT chest 1.  No evidence of metastatic disease in the thorax.  2.  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 Consider correlation with genetic testing.  3.  Reference same day abdominal MRI for details regarding the right renal mass and associated tumor thrombus in the right renal vein and IVC    09/29/2022 Imaging   MRI 1.  Right interpolar renal 3.5 cm mass concerning for primary renal malignancy. Upper pole and lower pole tumor is favored to primarily be intravenous tumor thrombus,  with intravenous tumor expanding the right renal vein, and the subdiaphragmatic IVC as detailed above. Differential includes renal cell carcinoma (favored) and transitional cell carcinoma.  2.  Please see outside 08/25/2022 CT abdomen and pelvis for evaluation of the collecting system on delayed/urographic phase.  3.  No definite lymphadenopathy.  4.  Signal changes in the L5 superior endplate are favored to reflect a fracture. However, recommend continued attention on follow-up.    10/27/2022 Pathology Results   SPECIMEN    Procedure:    Radical nephrectomy     Specimen Laterality:    Right   TUMOR    Tumor Focality:    Unifocal     Tumor Size:    Greatest Dimension (Centimeters): 8.2 cm     Histologic Type:    Papillary renal cell carcinoma, type 2     Histologic Grade (WHO / ISUP):    G3 (nucleoli conspicuous and eosinophilic at 100x magnification)     Tumor Extent:    Extends into perinephric tissue (beyond renal capsule)     Tumor Extent:    Extends into renal sinus     Tumor Extent:    Extends into major vein (renal vein or its segmental branches, inferior vena cava)     Sarcomatoid Features:    Not identified     Rhabdoid Features:    Not identified     Tumor Necrosis:    Not identified     Lymphovascular Invasion:    Present   MARGINS    Margin Status:    Invasive carcinoma present at margin       Margin(s) Involved by Invasive Carcinoma:    Renal vein: present in renal vein nephrectomy margin; No tumor seen is separately submitted renal vein segment.   REGIONAL LYMPH NODES     Regional Lymph Node Status:    Not applicable (no regional lymph nodes submitted or found)   PATHOLOGIC STAGE CLASSIFICATION (pTNM, AJCC 8th Edition)     Reporting of pT, pN, and (when applicable) pM categories is based on information available to the pathologist at the time the report is issued. As per the AJCC (Chapter 1, 8th Ed.) it is the managing physician's responsibility to establish the final   pathologic stage based upon all pertinent information, including but potentially not limited to this pathology report.     Primary Tumor (pT):    pT3b  Regional Lymph Nodes (pN):    pN not assigned (no nodes submitted or found)    03/08/2023 Imaging   CT CAP Status post radical right nephrectomy, IVC repair, and partial right adrenalectomy without evidence of recurrent or metastatic disease.  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  L5 compression deformity without progressive interval height loss  Lungs/Pleura: No large airspace consolidation or pleural effusion. Similar distribution of numerous thin-walled pulmonary cysts throughout the bilateral lungs. Bibasilar atelectasis/scarring. No new pulmonary nodules.    09/13/2023 Imaging   CT CAP 5 mm left lower lobe pulmonary nodule Solid upper lobe pulmonary nodule 3 mm 4 mm right lower lobe nodule Sclerotic focus T1 change from prior likely bone island.  No aggressive lytic lesion.  Multilevel degenerative changes of spine and bilateral shoulder Similar appearance of sclerotic superior endplate deformity at L5 unchanged dating back to September 2023.  New from April 2019. 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 No pathologically enlarged abdominal or pelvic lymph nodes.   10/08/2023 Initial Diagnosis   Renal cell carcinoma, right (HCC)   11/08/2023 -  Chemotherapy   Patient is on Treatment Plan : BLADDER Pembrolizumab  (200) q21d         REVIEW OF SYSTEMS:   All relevant systems were reviewed with the patient and are negative.   VITALS:  Blood pressure (!) 152/84, pulse 67, temperature (!) 97.5  F (36.4 C), temperature source Temporal, resp. rate 15, weight 224 lb 4.8 oz (101.7 kg), SpO2 100%.  Wt Readings from Last 3 Encounters:  11/29/23 224 lb 4.8 oz (101.7 kg)  11/16/23 224 lb 12.8 oz (102 kg)  11/08/23 224 lb 14.4 oz (102 kg)    Body mass index is 28.04 kg/m.  Performance status (ECOG): 0 - Asymptomatic  PHYSICAL EXAM:   GENERAL: alert, no distress and comfortable SKIN: skin color normal, no rashes  EYES: normal, sclera clear OROPHARYNX: no exudate, no erythema    NECK: supple,  non-tender, without nodularity LYMPH:  no palpable cervical lymphadenopathy LUNGS: clear to auscultation with normal breathing effort.  No wheeze or rales HEART: regular rate & rhythm and no murmurs and no lower extremity edema ABDOMEN: abdomen soft, non-tender and nondistended Musculoskeletal: no edema NEURO: alert, fluent speech, no focal motor/sensory deficits.  Strength and sensation equal bilaterally.  LABORATORY DATA:  I have reviewed the data as listed    Component Value Date/Time   NA 136 11/29/2023 1231   K 3.5 11/29/2023 1231   CL 101 11/29/2023 1231   CO2 28 11/29/2023 1231   GLUCOSE 123 (H) 11/29/2023 1231   BUN 19 11/29/2023 1231   CREATININE 1.51 (H) 11/29/2023 1231   CALCIUM  9.8 11/29/2023 1231   PROT 8.7 (H) 11/29/2023 1231   ALBUMIN 4.2 11/29/2023 1231   AST 20 11/29/2023 1231   ALT 39 11/29/2023 1231   ALKPHOS 102 11/29/2023 1231   BILITOT 0.4 11/29/2023 1231   GFRNONAA 48 (L) 11/29/2023 1231   GFRAA >60 04/16/2020 2121    No results found for: SPEP, UPEP  Lab Results  Component Value Date   WBC 6.7 11/29/2023   NEUTROABS 4.6 11/29/2023   HGB 14.2 11/29/2023   HCT 43.1 11/29/2023  MCV 80.9 11/29/2023   PLT 214 11/29/2023      Chemistry      Component Value Date/Time   NA 136 11/29/2023 1231   K 3.5 11/29/2023 1231   CL 101 11/29/2023 1231   CO2 28 11/29/2023 1231   BUN 19 11/29/2023 1231   CREATININE 1.51 (H) 11/29/2023 1231       Component Value Date/Time   CALCIUM  9.8 11/29/2023 1231   ALKPHOS 102 11/29/2023 1231   AST 20 11/29/2023 1231   ALT 39 11/29/2023 1231   BILITOT 0.4 11/29/2023 1231       RADIOGRAPHIC STUDIES: I have personally reviewed the radiological images as listed and agreed with the findings in the report. No results found.

## 2023-12-03 ENCOUNTER — Other Ambulatory Visit (HOSPITAL_COMMUNITY): Payer: Self-pay

## 2023-12-03 ENCOUNTER — Other Ambulatory Visit: Payer: Self-pay

## 2023-12-03 ENCOUNTER — Telehealth: Payer: Self-pay | Admitting: Pharmacy Technician

## 2023-12-03 NOTE — Telephone Encounter (Signed)
 Oral Oncology Patient Advocate Encounter  Was successful in securing patient a $10,000 grant from [facility] to provide copayment coverage for [place] .  This will keep the out of pocket expense at $0.     [employer] ID: [id number]  I have spoken with the patient.   The billing information is as follows and has been shared with [facility].    RxBin: [id number] PCN: [id number] Member ID: [id number] Group ID: [id number] Dates of Eligibility: 11/01/23 through 11/01/24  Fund:  Renal Cell  [name], CPhT-Adv Oncology Pharmacy Patient Advocate [facility] Direct Number: [phone]  Fax: [phone]

## 2023-12-11 ENCOUNTER — Telehealth: Payer: Self-pay

## 2023-12-11 NOTE — Telephone Encounter (Signed)
 Pt advised per Dr Cherly Hensen OK to take OTC mucinex.  Pt will call PCP if S/S do not improve

## 2023-12-11 NOTE — Telephone Encounter (Signed)
 T/C from pt with cold symptoms and he is asking what he can take. Head and chest congestion, no fever or cough mostly clear nasal congestion with some small blood clots.  OK to take mucinex? Please advise

## 2023-12-13 ENCOUNTER — Other Ambulatory Visit: Payer: Self-pay

## 2023-12-13 ENCOUNTER — Encounter (HOSPITAL_COMMUNITY): Payer: Self-pay | Admitting: *Deleted

## 2023-12-13 ENCOUNTER — Observation Stay (HOSPITAL_COMMUNITY)
Admission: EM | Admit: 2023-12-13 | Discharge: 2023-12-14 | Disposition: A | Discharge status: Home / Self Care | Payer: Medicare PPO | Attending: Family Medicine | Admitting: Family Medicine

## 2023-12-13 ENCOUNTER — Telehealth: Payer: Self-pay

## 2023-12-13 ENCOUNTER — Emergency Department (HOSPITAL_COMMUNITY): Payer: Medicare PPO

## 2023-12-13 DIAGNOSIS — Z79899 Other long term (current) drug therapy: Secondary | ICD-10-CM | POA: Diagnosis not present

## 2023-12-13 DIAGNOSIS — E119 Type 2 diabetes mellitus without complications: Secondary | ICD-10-CM | POA: Diagnosis not present

## 2023-12-13 DIAGNOSIS — C7931 Secondary malignant neoplasm of brain: Secondary | ICD-10-CM | POA: Diagnosis not present

## 2023-12-13 DIAGNOSIS — R42 Dizziness and giddiness: Secondary | ICD-10-CM | POA: Diagnosis not present

## 2023-12-13 DIAGNOSIS — R55 Syncope and collapse: Secondary | ICD-10-CM

## 2023-12-13 DIAGNOSIS — I951 Orthostatic hypotension: Principal | ICD-10-CM | POA: Insufficient documentation

## 2023-12-13 DIAGNOSIS — R000 Tachycardia, unspecified: Principal | ICD-10-CM

## 2023-12-13 DIAGNOSIS — Z85528 Personal history of other malignant neoplasm of kidney: Secondary | ICD-10-CM | POA: Insufficient documentation

## 2023-12-13 DIAGNOSIS — I10 Essential (primary) hypertension: Secondary | ICD-10-CM | POA: Diagnosis not present

## 2023-12-13 DIAGNOSIS — C7800 Secondary malignant neoplasm of unspecified lung: Secondary | ICD-10-CM | POA: Diagnosis not present

## 2023-12-13 DIAGNOSIS — F17220 Nicotine dependence, chewing tobacco, uncomplicated: Secondary | ICD-10-CM | POA: Insufficient documentation

## 2023-12-13 DIAGNOSIS — R7989 Other specified abnormal findings of blood chemistry: Secondary | ICD-10-CM | POA: Insufficient documentation

## 2023-12-13 DIAGNOSIS — Z8546 Personal history of malignant neoplasm of prostate: Secondary | ICD-10-CM | POA: Insufficient documentation

## 2023-12-13 DIAGNOSIS — N179 Acute kidney failure, unspecified: Secondary | ICD-10-CM | POA: Diagnosis not present

## 2023-12-13 LAB — URINALYSIS, ROUTINE W REFLEX MICROSCOPIC
Bilirubin Urine: NEGATIVE
Glucose, UA: NEGATIVE mg/dL
Ketones, ur: NEGATIVE mg/dL
Leukocytes,Ua: NEGATIVE
Nitrite: NEGATIVE
Protein, ur: 100 mg/dL — AB
Specific Gravity, Urine: 1.006 (ref 1.005–1.030)
pH: 6 (ref 5.0–8.0)

## 2023-12-13 LAB — I-STAT CG4 LACTIC ACID, ED: Lactic Acid, Venous: 1.2 mmol/L (ref 0.5–1.9)

## 2023-12-13 LAB — TROPONIN I (HIGH SENSITIVITY)
Troponin I (High Sensitivity): 8 ng/L (ref <18)
Troponin I (High Sensitivity): 25 ng/L — ABNORMAL HIGH (ref <18)
Troponin I (High Sensitivity): 29 ng/L — ABNORMAL HIGH (ref <18)

## 2023-12-13 LAB — CBC
HCT: 39.1 % (ref 39.0–52.0)
Hemoglobin: 12.7 g/dL — ABNORMAL LOW (ref 13.0–17.0)
MCH: 27 pg (ref 26.0–34.0)
MCHC: 32.5 g/dL (ref 30.0–36.0)
MCV: 83 fL (ref 80.0–100.0)
Platelets: 235 10*3/uL (ref 150–400)
RBC: 4.71 MIL/uL (ref 4.22–5.81)
RDW: 15 % (ref 11.5–15.5)
WBC: 6.5 10*3/uL (ref 4.0–10.5)
nRBC: 0 % (ref 0.0–0.2)

## 2023-12-13 LAB — CBC WITH DIFFERENTIAL/PLATELET
Abs Immature Granulocytes: 0.03 10*3/uL (ref 0.00–0.07)
Basophils Absolute: 0 10*3/uL (ref 0.0–0.1)
Basophils Relative: 1 %
Eosinophils Absolute: 0.1 10*3/uL (ref 0.0–0.5)
Eosinophils Relative: 3 %
HCT: 42.9 % (ref 39.0–52.0)
Hemoglobin: 13.6 g/dL (ref 13.0–17.0)
Immature Granulocytes: 1 %
Lymphocytes Relative: 23 %
Lymphs Abs: 1.2 10*3/uL (ref 0.7–4.0)
MCH: 26.3 pg (ref 26.0–34.0)
MCHC: 31.7 g/dL (ref 30.0–36.0)
MCV: 83 fL (ref 80.0–100.0)
Monocytes Absolute: 0.5 10*3/uL (ref 0.1–1.0)
Monocytes Relative: 9 %
Neutro Abs: 3.3 10*3/uL (ref 1.7–7.7)
Neutrophils Relative %: 63 %
Platelets: 260 10*3/uL (ref 150–400)
RBC: 5.17 MIL/uL (ref 4.22–5.81)
RDW: 15.2 % (ref 11.5–15.5)
WBC: 5.2 10*3/uL (ref 4.0–10.5)
nRBC: 0 % (ref 0.0–0.2)

## 2023-12-13 LAB — COMPREHENSIVE METABOLIC PANEL
ALT: 20 U/L (ref 0–44)
AST: 16 U/L (ref 15–41)
Albumin: 3.3 g/dL — ABNORMAL LOW (ref 3.5–5.0)
Alkaline Phosphatase: 86 U/L (ref 38–126)
Anion gap: 8 (ref 5–15)
BUN: 22 mg/dL (ref 8–23)
CO2: 26 mmol/L (ref 22–32)
Calcium: 8.9 mg/dL (ref 8.9–10.3)
Chloride: 102 mmol/L (ref 98–111)
Creatinine, Ser: 1.92 mg/dL — ABNORMAL HIGH (ref 0.61–1.24)
GFR, Estimated: 36 mL/min — ABNORMAL LOW (ref >60)
Glucose, Bld: 106 mg/dL — ABNORMAL HIGH (ref 70–99)
Potassium: 3.9 mmol/L (ref 3.5–5.1)
Sodium: 136 mmol/L (ref 135–145)
Total Bilirubin: 0.5 mg/dL (ref 0.0–1.2)
Total Protein: 7.6 g/dL (ref 6.5–8.1)

## 2023-12-13 LAB — HEMOGLOBIN A1C
Hgb A1c MFr Bld: 7.1 % — ABNORMAL HIGH (ref 4.8–5.6)
Mean Plasma Glucose: 157.07 mg/dL

## 2023-12-13 LAB — CREATININE, SERUM
Creatinine, Ser: 1.42 mg/dL — ABNORMAL HIGH (ref 0.61–1.24)
GFR, Estimated: 52 mL/min — ABNORMAL LOW (ref >60)

## 2023-12-13 LAB — TSH: TSH: 5.017 u[IU]/mL — ABNORMAL HIGH (ref 0.350–4.500)

## 2023-12-13 MED ORDER — FINASTERIDE 5 MG PO TABS: 5.0000 mg | ORAL_TABLET | Freq: Every day | ORAL | Status: DC

## 2023-12-13 MED ORDER — HYDRALAZINE HCL 20 MG/ML IJ SOLN: 5.0000 mg | Freq: Four times a day (QID) | INTRAMUSCULAR | Status: DC | PRN

## 2023-12-13 MED ORDER — SODIUM CHLORIDE 0.9% FLUSH
3.0000 mL | Freq: Two times a day (BID) | INTRAVENOUS | Status: DC
  Administered 2023-12-13: 3 mL via INTRAVENOUS

## 2023-12-13 MED ORDER — EPINEPHRINE 0.3 MG/0.3ML IJ SOAJ: 0.3000 mg | INTRAMUSCULAR | Status: DC | PRN

## 2023-12-13 MED ORDER — HEPARIN SODIUM (PORCINE) 5000 UNIT/ML IJ SOLN
5000.0000 [IU] | Freq: Three times a day (TID) | INTRAMUSCULAR | Status: DC
  Administered 2023-12-13 – 2023-12-14 (×2): 5000 [IU] via SUBCUTANEOUS
  Filled 2023-12-13 (×2): qty 1

## 2023-12-13 MED ORDER — ATORVASTATIN CALCIUM 10 MG PO TABS: 10.0000 mg | ORAL_TABLET | Freq: Every day | ORAL | Status: DC

## 2023-12-13 MED ORDER — POLYETHYLENE GLYCOL 3350 17 G PO PACK: 17.0000 g | PACK | Freq: Every day | ORAL | Status: DC | PRN

## 2023-12-13 MED ORDER — SODIUM CHLORIDE 0.9 % IV SOLN: INTRAVENOUS | Status: DC

## 2023-12-13 MED ORDER — LATANOPROST 0.005 % OP SOLN
1.0000 [drp] | Freq: Every day | OPHTHALMIC | Status: DC
  Filled 2023-12-13: qty 2.5

## 2023-12-13 MED ORDER — SODIUM CHLORIDE 0.9% FLUSH: 3.0000 mL | INTRAVENOUS | Status: DC | PRN

## 2023-12-13 MED ORDER — ACETAMINOPHEN 650 MG RE SUPP: 650.0000 mg | Freq: Four times a day (QID) | RECTAL | Status: DC | PRN

## 2023-12-13 MED ORDER — TAMSULOSIN HCL 0.4 MG PO CAPS
0.4000 mg | ORAL_CAPSULE | Freq: Every day | ORAL | Status: DC
  Administered 2023-12-14: 0.4 mg via ORAL
  Filled 2023-12-13: qty 1

## 2023-12-13 MED ORDER — ONDANSETRON HCL 4 MG PO TABS: 4.0000 mg | ORAL_TABLET | Freq: Four times a day (QID) | ORAL | Status: DC | PRN

## 2023-12-13 MED ORDER — SODIUM CHLORIDE 0.9 % IV SOLN
250.0000 mL | INTRAVENOUS | Status: DC | PRN
Start: 2023-12-13 — End: 2023-12-14

## 2023-12-13 MED ORDER — ACETAMINOPHEN 325 MG PO TABS: 650.0000 mg | ORAL_TABLET | Freq: Four times a day (QID) | ORAL | Status: DC | PRN

## 2023-12-13 MED ORDER — SODIUM CHLORIDE 0.9 % IV BOLUS
1000.0000 mL | Freq: Once | INTRAVENOUS | Status: AC
  Administered 2023-12-13: 1000 mL via INTRAVENOUS

## 2023-12-13 MED ORDER — ONDANSETRON HCL 4 MG/2ML IJ SOLN
4.0000 mg | Freq: Four times a day (QID) | INTRAMUSCULAR | Status: DC | PRN
Start: 2023-12-13 — End: 2023-12-14

## 2023-12-13 NOTE — ED Notes (Signed)
Nurse received pt resting in bed with spouse at bedside. Bed at lowest level and call light in reach of pt.

## 2023-12-13 NOTE — Telephone Encounter (Signed)
VM received from pt's wife this AM. Upon returning call approximately 5 minutes later, she reports that she has called 911 for pt d/t him feeling dizzy and light-headed. She reports that pt felt like he was about to pass out and had to sit down. She also states that she offered him some water and he got choked when trying to swallow it. He is stable at this time, alert and oriented. Awaiting ambulance. Wished pt well and advised that Dr. Cherly Hensen would be informed of this news.

## 2023-12-13 NOTE — H&P (Addendum)
[facility] History and Physical  [name]:[id number] DOB: 1946/07/04 DOA: 12/13/2023  Referring physician: ED  PCP: [name], MD   Patient is coming from: Home  Chief Complaint: Presyncope  HPI:  Patient is a 78 years old male with past medical history of  diabetes, hypertension, hyperlipidemia, prostate cancer status post RT and renal cell carcinoma status post nephrectomy presented to the hospital with lightheadedness, palpitations to  the point that he nearly passed out.  He stated that he had a episode like this 2 days back when it resolved on its own but this morning when he went to bathroom hen suddenly felt lightheaded with feeling of hotness in his head. He sat down for a while after this.  Patient denies any chest pain or shortness of breath. No mention of abdominal pain, nausea vomiting or diarrhea.  Patient denies any fever, chills or rigor.  Patient is currently on lenvatinib and pembrolizumab for renal cell carcinoma and follows up with oncology.  For today's symptoms, patient was brought into the hospital by EMS who noted tachycardia in the 150s 160s and blood pressure was systolic of 60 while sitting down.  Patient received half liter of IV fluid bolus after which his blood pressure improved.  In the ED, initial blood pressure was 121/ 81.  Labs showed normal WBC, hemoglobin of 13.6.  Creatinine was elevated at 1.9 with BUN of 22.  Troponin initial set was 8 followed by 25 and 29.  Urinalysis showed protein with some bacteria but no white cells.  Chest x-ray showed a subtle nodular focus in the left lung base.  In the ED received 1 liter bolus of normal saline and patient was then considered for admission to hospital for further evaluation and treatment for AKI, presyncopal symptoms, initial orthostatic symptoms and mild elevated troponins..   Assessment and Plan Principal Problem:   Postural dizziness with presyncope Active Problems:   HTN (hypertension)    Controlled type 2 diabetes mellitus without complication, without long-term current use of insulin (HCC)  Presyncope with postural dizziness. Possibly orthostatic hypotension from volume depletion.  Unable to rule out tachycardia related dizziness..  Patient's initial systolic blood pressure was in the 60s and had tachycardia.  Will closely monitor overnight in telemetry.  Received IV fluid bolus with improvement in his blood pressure.  Continue with IV fluids overnight.  Will check orthostatic vitals every shift x 2, check 2D echocardiogram.  Check PT evaluation.  Concern for SVT/junctional tachycardia due to tachycardia..  Unclear on presentation but subsequently to be sinus rhythm.  Will monitor overnight.  Check 2D echocardiogram.  Check TSH  Mild elevated troponin.  Could be secondary to tachycardia initially.  Denies any chest pain.  Current EKG with sinus rhythm.  Will trend third set of troponin.  Check 2D echocardiogram.  Unlikely to be acute coronary syndrome.  Prediabetes.  As per the patient.  Used to be on metformin but not taking it..  Check hemoglobin A1c.  AKI likely secondary to hypotension and volume depletion.  Creatinine on presentation at 1.9.  Baseline creatinine around 1.4-1.5.  Patient received IV fluid bolus in the ED.  Will continue with IV hydration.  Check BMP in AM.  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  Will closely monitor renal function.  Continue hydration for now.  On Lenvanitib active as outpatient, hold for now.  Patient states that he is in  remission  Hypertension.  Will hold antihypertensives.  On lisinopril- HCTZ and amlodipine at home.  Currently getting IV fluids   History of prostate cancer.  Continue tamsulosin and finasteride.  History of glaucoma.  Continue ocular drops.  History of hyperlipidemia.  On Lipitor.  Will continue.  DVT Prophylaxis: Heparin subcu  Review  of Systems:  All systems were reviewed and were negative unless otherwise mentioned in the HPI   Past Medical History:  Diagnosis Date   Allergy    Anemia    Arthritis    Diabetes mellitus without complication (HCC) 12/2021   ED (erectile dysfunction)    Family history of cancer    Family history of prostate cancer    Genetic testing 05/02/2018   Multi-Cancer panel (83 genes) @ [facility] - No pathogenic mutations detected   History of anemia    due to acute blood loss from lower GI bleeds 2007, 2010, 2013   History of GI diverticular bleed    tranfused 03/ 2010, 01/ 2013/  no blood needed 09/ 2014, 2015   History of lower GI bleeding 08/2006   post colonoscopy w/ polypectomy -- treatment colonoscopy w/ endo clip at polypectomy site and 2PRBCs   History of pneumothorax 07/2008   spontaneous-- tx chest tube   HLD (hyperlipidemia)    HTN (hypertension)    Hx of adenomatous colonic polyps    Hyperplasia of prostate with lower urinary tract symptoms (LUTS)    Mild obstructive sleep apnea    06-21-2018 per pt study done 8 yrs ago, told borderline osa , no cpap   Primary hypogonadism in male    Prostate cancer [facility]) urologist-  dr winter/  oncologist-  dr Kathrynn Running   first dx 04/ 2015  Stage T1c, PSA 4.94;  last prostate bx 02-25-2018  Stage T1c, Gleason 3+4, PSA 9.45, vol 120cc-- planned treatement external beam radiation   Wears glasses    Past Surgical History:  Procedure Laterality Date   COLONOSCOPY  02/08/2022   2019   COLONOSCOPY W/ POLYPECTOMY  last one 2014   GOLD SEED IMPLANT N/A 06/26/2018   Procedure: GOLD SEED IMPLANT;  Surgeon: Rene Paci, MD;  Location: [facility];  Service: Urology;  Laterality: N/A;  ONLY NEEDS 30 MIN FOR ALL PROCEDURES   PROSTATE BIOPSY  last one 02-25-2018   dr winter office   SPACE OAR INSTILLATION N/A 06/26/2018   Procedure: SPACE OAR INSTILLATION;  Surgeon: Rene Paci, MD;  Location: [facility];  Service: Urology;  Laterality: N/A;   TONSILLECTOMY  child   UMBILICAL HERNIA REPAIR  2012    Social History:  reports that he has never smoked. His smokeless tobacco use includes chew. He reports current alcohol use. He reports that he does not use drugs.  Allergies  Allergen Reactions   Bee Venom Anaphylaxis    Foaming at mouth, unresponsive   Aspirin Nausea And Vomiting and Other (See Comments)   Penicillins Itching and Rash    Family History  Problem Relation Age of Onset   Heart disease Mother    Diabetes Mother    Hypertension Mother    Cancer Father 75       metastatic; unk. primary; deceased 29   Cancer Maternal Uncle        unk. primary   Cancer Paternal Aunt        unk. primary; died young   Colon cancer Paternal Uncle 62  deceased 50   Prostate cancer Cousin 56       maternal first cousin; son of uncle with metastatic cancer   Prostate cancer Cousin        paternal first cousin; son of unaffected paternal aunt   Esophageal cancer Neg Hx    Rectal cancer Neg Hx    Stomach cancer Neg Hx    Colon polyps Neg Hx      Prior to Admission medications   Medication Sig Start Date End Date Taking? Authorizing Provider  amLODipine (NORVASC) 10 MG tablet Take 1 tablet (10 mg total) by mouth daily. Patient taking differently: Take 10 mg by mouth in the morning. 11/16/23  Yes [name], MD  atorvastatin (LIPITOR) 10 MG tablet Take 10 mg by mouth in the morning.   Yes [provider]  EPINEPHrine 0.3 mg/0.3 mL IJ SOAJ injection Inject 0.3 mg into the muscle as needed for anaphylaxis.   Yes [provider]  Fexofenadine HCl (MUCINEX ALLERGY PO) Take 2 capsules by mouth every 4 (four) hours as needed.   Yes [provider]  finasteride (PROSCAR) 5 MG tablet Take 5 mg by mouth in the morning. 03/15/20  Yes [provider]  Grape Seed Extract 100 MG CAPS Take 300 mg by mouth. 300 mg   Yes [provider]   latanoprost (XALATAN) 0.005 % ophthalmic solution 1 drop at bedtime. 01/23/22  Yes [provider]  lenvatinib 20 mg daily dose (LENVIMA) 2 x 10 MG capsule Take 2 capsules (20 mg total) by mouth daily. Patient taking differently: Take 20 mg by mouth in the morning. 10/23/23  Yes [name], MD  lisinopril-hydrochlorothiazide (PRINZIDE,ZESTORETIC) 20-25 MG per tablet Take 1 tablet by mouth every morning.  05/30/15  Yes [provider]  Multiple Vitamins-Minerals (MULTIVITAMIN WITH MINERALS) tablet Take 1 tablet by mouth in the morning.   Yes [provider]  OVER THE COUNTER MEDICATION Take 1 tablet by mouth in the morning. Lion's mane supplement   Yes [provider]  tamsulosin (FLOMAX) 0.4 MG CAPS capsule Take 0.4 mg by mouth daily after breakfast.    Yes [provider]  ondansetron (ZOFRAN) 8 MG tablet Take 1 tablet (8 mg total) by mouth every 8 (eight) hours as needed for nausea or vomiting. Patient not taking: Reported on 12/13/2023 10/23/23   [name], MD  prochlorperazine (COMPAZINE) 10 MG tablet Take 1 tablet (10 mg total) by mouth every 6 (six) hours as needed for nausea or vomiting. Patient not taking: Reported on 12/13/2023 10/23/23   [name], MD    Physical Exam:  Vitals:   12/13/23 1300 12/13/23 1400 12/13/23 1529 12/13/23 1530  BP: 128/85 124/85  139/85  Pulse:    88  Resp: 11 13  20   Temp:   98.5 F (36.9 C)   TempSrc:   Oral   SpO2:    100%   Wt Readings from Last 3 Encounters:  11/29/23 101.7 kg  11/16/23 102 kg  11/08/23 102 kg   There is no height or weight on file to calculate BMI.  General:  Average built, not in obvious distress, elderly male, HENT: Normocephalic, No scleral pallor or icterus noted. Oral mucosa is moist.  Chest:  Clear breath sounds.  . No crackles or wheezes.  CVS: S1 &S2 heard. No murmur.  Regular rate and rhythm. Abdomen: Soft, nontender, nondistended.  Bowel sounds are heard.  No abdominal mass palpated Extremities: No cyanosis,  clubbing or edema.  Peripheral pulses are palpable. Psych: Alert, awake and oriented, normal mood CNS:  No cranial nerve deficits.  Power equal in all extremities.   Skin: Warm and dry.  No rashes noted.  Labs on Admission:   CBC: Recent Labs  Lab 12/13/23 1114  WBC 5.2  NEUTROABS 3.3  HGB 13.6  HCT 42.9  MCV 83.0  PLT 260    Basic Metabolic Panel: Recent Labs  Lab 12/13/23 1114  NA 136  K 3.9  CL 102  CO2 26  GLUCOSE 106*  BUN 22  CREATININE 1.92*  CALCIUM 8.9    Liver Function Tests: Recent Labs  Lab 12/13/23 1114  AST 16  ALT 20  ALKPHOS 86  BILITOT 0.5  PROT 7.6  ALBUMIN 3.3*   No results for input(s): "LIPASE", "AMYLASE" in the last 168 hours. No results for input(s): "AMMONIA" in the last 168 hours.  Cardiac Enzymes: No results for input(s): "CKTOTAL", "CKMB", "CKMBINDEX", "TROPONINI" in the last 168 hours.  BNP (last 3 results) No results for input(s): "BNP" in the last 8760 hours.  ProBNP (last 3 results) No results for input(s): "PROBNP" in the last 8760 hours.  CBG: No results for input(s): "GLUCAP" in the last 168 hours.  Lipase  No results found for: "LIPASE"   Urinalysis    Component Value Date/Time   COLORURINE YELLOW 12/13/2023 1114   APPEARANCEUR CLEAR 12/13/2023 1114   LABSPEC 1.006 12/13/2023 1114   PHURINE 6.0 12/13/2023 1114   GLUCOSEU NEGATIVE 12/13/2023 1114   HGBUR SMALL (A) 12/13/2023 1114   BILIRUBINUR NEGATIVE 12/13/2023 1114   KETONESUR NEGATIVE 12/13/2023 1114   PROTEINUR 100 (A) 12/13/2023 1114   UROBILINOGEN 0.2 12/29/2011 1159   NITRITE NEGATIVE 12/13/2023 1114   LEUKOCYTESUR NEGATIVE 12/13/2023 1114     Drugs of Abuse  No results found for: "LABOPIA", "COCAINSCRNUR", "LABBENZ", "AMPHETMU", "THCU", "LABBARB"    Radiological Exams on Admission: DG Chest 2 View Result Date: 12/13/2023 CLINICAL DATA:  Tachycardia EXAM: CHEST - 2 VIEW COMPARISON:  X-ray  02/02/2009 FINDINGS: No consolidation, pneumothorax or effusion. Normal cardiopericardial silhouette without edema. Sternal wires. Overlapping cardiac leads. Degenerative changes along the spine. Subtle nodular density along the left lung base overlying the anterior aspect of the left sixth rib. IMPRESSION: Postop chest. Subtle nodular focus in the left lung base could be overlapping shadow nipple shadows. Follow up one-view x-ray with nipple markers could be performed when clinically appropriate versus oblique imaging or CT. Electronically Signed   By: Karen Kays M.D.   On: 12/13/2023 12:07    EKG: Personally reviewed by me which shows initial concern for junctional/SVT subsequently sinus rhythm.   Consultant: None  Code Status: Full code  Microbiology none  Antibiotics: None  Family Communication:   Patients' condition and plan of care including tests being ordered have been discussed with the patient and the patient's wife at bedside who indicate understanding and agree with the plan.   Status is: Observation   Severity of Illness: The appropriate patient status for this patient is OBSERVATION. Observation status is judged to be reasonable and necessary in order to provide the required intensity of service to ensure the patient's safety. The patient's presenting symptoms, physical exam findings, and initial radiographic and laboratory data in the context of their medical condition is felt to place them at decreased risk for further clinical deterioration. Furthermore, it is anticipated that the patient will be medically stable for discharge from the hospital within 2 midnights of  admission.   Signed, Joycelyn Das, MD [facility] 12/13/2023

## 2023-12-13 NOTE — ED Triage Notes (Signed)
Pt called ems due to weakness and light headed.  Pt was found with HR 160 systolic BP 60.  Pt was then placed on monitor and found to be HR 140-150 sinus tach.  Pt was given 500cc NS and HR decreased to 130's.  On arrival to ED NSR 80's.  Pt reports that he feels better

## 2023-12-13 NOTE — ED Provider Notes (Signed)
[place] EMERGENCY DEPARTMENT AT [facility] Provider Note   CSN: [id number] Arrival date & time: 12/13/23  1034     History  Chief Complaint  Patient presents with   Tachycardia    [name] is a 78 y.o. male.  HPI 78 year old male history of diabetes, hypertension, hyperlipidemia,, prostate cancer status post EBRT and renal cell carcinoma status post nephrectomy currently with stage IV renal cell carcinoma presenting for tachycardia and hypotension.  Patient is currently on hold lenvatinib and pembrolizumab for renal cell carcinoma is following with oncology.  Over the last few days when he wakes up he feels very dizzy and has palpitations when he stands up.  It was worse today and he nearly passed out.  Has not had any chest pain or shortness of breath or abdominal pain.  No vomiting or diarrhea, normal p.o. intake.  He called EMS, with EMS he had a narrow complex tachycardia.  Be sinus into the 150s and 160s with blood pressure initially 60 systolic while sitting down.  He received 1/2 L of fluids.  When he was reassessed his blood pressure had improved slightly to 98/62 after fluids.  At rest currently he is asymptomatic.  No chest pain or shortness of breath or dizziness.  However when he stands up he still feels like he gets dizzy.  He is not sure why this is happening.  He has slight headache earlier during the first episode but this started after the episode and headache is resolved.  No vision changes or weakness or numbness.  No fevers or chills.     Home Medications Prior to Admission medications   Medication Sig Start Date End Date Taking? Authorizing Provider  amLODipine (NORVASC) 10 MG tablet Take 1 tablet (10 mg total) by mouth daily. Patient taking differently: Take 10 mg by mouth in the morning. 11/16/23  Yes [name], MD  atorvastatin (LIPITOR) 10 MG tablet Take 10 mg by mouth in the morning.   Yes [provider]  EPINEPHrine 0.3  mg/0.3 mL IJ SOAJ injection Inject 0.3 mg into the muscle as needed for anaphylaxis.   Yes [provider]  Fexofenadine HCl (MUCINEX ALLERGY PO) Take 2 capsules by mouth every 4 (four) hours as needed.   Yes [provider]  finasteride (PROSCAR) 5 MG tablet Take 5 mg by mouth in the morning. 03/15/20  Yes [provider]  Grape Seed Extract 100 MG CAPS Take 300 mg by mouth. 300 mg   Yes [provider]  latanoprost (XALATAN) 0.005 % ophthalmic solution 1 drop at bedtime. 01/23/22  Yes [provider]  lenvatinib 20 mg daily dose (LENVIMA) 2 x 10 MG capsule Take 2 capsules (20 mg total) by mouth daily. Patient taking differently: Take 20 mg by mouth in the morning. 10/23/23  Yes [name], MD  lisinopril-hydrochlorothiazide (PRINZIDE,ZESTORETIC) 20-25 MG per tablet Take 1 tablet by mouth every morning.  05/30/15  Yes [provider]  Multiple Vitamins-Minerals (MULTIVITAMIN WITH MINERALS) tablet Take 1 tablet by mouth in the morning.   Yes [provider]  OVER THE COUNTER MEDICATION Take 1 tablet by mouth in the morning. Lion's mane supplement   Yes [provider]  tamsulosin (FLOMAX) 0.4 MG CAPS capsule Take 0.4 mg by mouth daily after breakfast.    Yes [provider]  ondansetron (ZOFRAN) 8 MG tablet Take 1 tablet (8 mg total) by mouth every 8 (eight) hours as needed for nausea or  vomiting. Patient not taking: Reported on 12/13/2023 10/23/23   [name], MD  prochlorperazine (COMPAZINE) 10 MG tablet Take 1 tablet (10 mg total) by mouth every 6 (six) hours as needed for nausea or vomiting. Patient not taking: Reported on 12/13/2023 10/23/23   [name], MD      Allergies    Bee venom, Aspirin, and Penicillins    Review of Systems   Review of Systems Review of systems completed and notable as per HPI.  ROS otherwise negative.   Physical Exam Updated Vital Signs BP 139/85   Pulse 88   Temp  98.5 F (36.9 C) (Oral)   Resp 20   SpO2 100%  Physical Exam Vitals and nursing note reviewed.  Constitutional:      General: He is not in acute distress.    Appearance: He is well-developed.  HENT:     Head: Normocephalic and atraumatic.     Nose: Nose normal.     Mouth/Throat:     Mouth: Mucous membranes are moist.     Pharynx: Oropharynx is clear.  Eyes:     Extraocular Movements: Extraocular movements intact.     Conjunctiva/sclera: Conjunctivae normal.     Pupils: Pupils are equal, round, and reactive to light.  Cardiovascular:     Rate and Rhythm: Normal rate and regular rhythm.     Pulses: Normal pulses.     Heart sounds: Normal heart sounds. No murmur heard. Pulmonary:     Effort: Pulmonary effort is normal. No respiratory distress.     Breath sounds: Normal breath sounds.  Abdominal:     Palpations: Abdomen is soft.     Tenderness: There is no abdominal tenderness. There is no guarding or rebound.  Musculoskeletal:        General: No swelling.     Cervical back: Neck supple.     Right lower leg: No edema.     Left lower leg: No edema.  Skin:    General: Skin is warm and dry.     Capillary Refill: Capillary refill takes less than 2 seconds.  Neurological:     General: No focal deficit present.     Mental Status: He is alert and oriented to person, place, and time. Mental status is at baseline.  Psychiatric:        Mood and Affect: Mood normal.     ED Results / Procedures / Treatments   Labs (all labs ordered are listed, but only abnormal results are displayed) Labs Reviewed  COMPREHENSIVE METABOLIC PANEL - Abnormal; Notable for the following components:      Result Value   Glucose, Bld 106 (*)    Creatinine, Ser 1.92 (*)    Albumin 3.3 (*)    GFR, Estimated 36 (*)    All other components within normal limits  URINALYSIS, ROUTINE W REFLEX MICROSCOPIC - Abnormal; Notable for the following components:   Hgb urine dipstick SMALL (*)    Protein, ur 100 (*)     Bacteria, UA RARE (*)    All other components within normal limits  TROPONIN I (HIGH SENSITIVITY) - Abnormal; Notable for the following components:   Troponin I (High Sensitivity) 25 (*)    All other components within normal limits  TROPONIN I (HIGH SENSITIVITY) - Abnormal; Notable for the following components:   Troponin I (High Sensitivity) 29 (*)    All other components within normal limits  CBC WITH DIFFERENTIAL/PLATELET  I-STAT CG4 LACTIC ACID, ED  TROPONIN  I (HIGH SENSITIVITY)    EKG EKG Interpretation Date/Time:  Thursday December 13 2023 11:01:46 EST Ventricular Rate:  99 PR Interval:  170 QRS Duration:  80 QT Interval:  350 QTC Calculation: 450 R Axis:   48  Text Interpretation: Sinus rhythm Abnormal R-wave progression, early transition Confirmed by Fulton Reek [id number]) on 12/13/2023 11:29:42 AM  Radiology DG Chest 2 View Result Date: 12/13/2023 CLINICAL DATA:  Tachycardia EXAM: CHEST - 2 VIEW COMPARISON:  X-ray 02/02/2009 FINDINGS: No consolidation, pneumothorax or effusion. Normal cardiopericardial silhouette without edema. Sternal wires. Overlapping cardiac leads. Degenerative changes along the spine. Subtle nodular density along the left lung base overlying the anterior aspect of the left sixth rib. IMPRESSION: Postop chest. Subtle nodular focus in the left lung base could be overlapping shadow nipple shadows. Follow up one-view x-ray with nipple markers could be performed when clinically appropriate versus oblique imaging or CT. Electronically Signed   By: Karen Kays M.D.   On: 12/13/2023 12:07    Procedures Procedures    Medications Ordered in ED Medications  sodium chloride 0.9 % bolus 1,000 mL (0 mLs Intravenous Stopped 12/13/23 1402)    ED Course/ Medical Decision Making/ A&P Clinical Course as of 12/13/23 1701  Thu Dec 13, 2023  1527 Patient is feeling much better.  No additional episodes  Mild AKI.  Lactic acid is normal.  Troponin fortunately is  uptrending.  Suspect his likely related to episode of tachycardia earlier which could have been SVT.  Will obtain troponin to think he benefit from admission for observation to trend troponin keep on telemetry and rehydrate. [JD]    Clinical Course User Index [JD] Laurence Spates, MD                                 Medical Decision Making Amount and/or Complexity of Data Reviewed Labs: ordered. Radiology: ordered.  Risk Decision regarding hospitalization.   Medical Decision Making:   FISCHER BOASE is a 78 y.o. male who presented to the ED today with generalized weakness, tachycardia, fatigue and hypotension.  On EMS arrival he had very low blood pressure and severe tachycardia.  EKG shows likely sinus tach although P waves are somewhat difficult to see, could be junctional tachycardia seems less consistent with SVT.  Improved with fluids.  Here he appears to be in sinus rhythm and is no longer tachycardic.  Blood pressure is normalized.  He has no fever or focal signs of infection.  He is immunocompromised.  Will give additional fluids here, obtain lab workup and watch on the monitor.   Patient placed on continuous vitals and telemetry monitoring while in ED which was reviewed periodically.  Reviewed and confirmed nursing documentation for past medical history, family history, social history.  Reassessment and Plan:   Labwork notable for mild AKI.  Lactic acid is normal.  Normal troponin initially although repeat is slightly elevated.  Likely due to transient arrhythmia earlier.  He is not having chest pain I lower concern for ACS.  However given his troponin I did discuss with hospitalist.  I spoke with Dr. Tyson Babinski with hospitalist, we obtained a third troponin which is still slightly elevated although roughly flat.  I discussed with patient new shared decision making, he be more comfortable staying for observation overnight.  Think this is reasonable.  Hospitalist agreeable to admit  appreciate care.   Patient's presentation is most consistent with acute complicated  illness / injury requiring diagnostic workup.           Final Clinical Impression(s) / ED Diagnoses Final diagnoses:  Tachycardia    Rx / DC Orders ED Discharge Orders     None         Laurence Spates, MD 12/13/23 1701

## 2023-12-13 NOTE — Hospital Course (Addendum)
 Marland Kitchen

## 2023-12-13 NOTE — ED Notes (Signed)
Pt ambulated to the nurses station without difficulty.  He was given ice water, crackers, PB and ham sandwich.  No distress

## 2023-12-13 NOTE — ED Notes (Signed)
BP lying 106/76 HR 83 BP sitting 108/92 HR 97 BP standing 127/110 HR 101

## 2023-12-14 DIAGNOSIS — R55 Syncope and collapse: Secondary | ICD-10-CM | POA: Diagnosis not present

## 2023-12-14 DIAGNOSIS — R42 Dizziness and giddiness: Secondary | ICD-10-CM | POA: Diagnosis not present

## 2023-12-14 LAB — CBC
HCT: 39.5 % (ref 39.0–52.0)
Hemoglobin: 12.4 g/dL — ABNORMAL LOW (ref 13.0–17.0)
MCH: 26.6 pg (ref 26.0–34.0)
MCHC: 31.4 g/dL (ref 30.0–36.0)
MCV: 84.8 fL (ref 80.0–100.0)
Platelets: 230 10*3/uL (ref 150–400)
RBC: 4.66 MIL/uL (ref 4.22–5.81)
RDW: 15.4 % (ref 11.5–15.5)
WBC: 5.5 10*3/uL (ref 4.0–10.5)
nRBC: 0 % (ref 0.0–0.2)

## 2023-12-14 LAB — BASIC METABOLIC PANEL
Anion gap: 9 (ref 5–15)
BUN: 19 mg/dL (ref 8–23)
CO2: 20 mmol/L — ABNORMAL LOW (ref 22–32)
Calcium: 8.5 mg/dL — ABNORMAL LOW (ref 8.9–10.3)
Chloride: 107 mmol/L (ref 98–111)
Creatinine, Ser: 1.44 mg/dL — ABNORMAL HIGH (ref 0.61–1.24)
GFR, Estimated: 51 mL/min — ABNORMAL LOW (ref >60)
Glucose, Bld: 98 mg/dL (ref 70–99)
Potassium: 3.6 mmol/L (ref 3.5–5.1)
Sodium: 136 mmol/L (ref 135–145)

## 2023-12-14 LAB — MAGNESIUM: Magnesium: 2.2 mg/dL (ref 1.7–2.4)

## 2023-12-14 MED ORDER — AMLODIPINE BESYLATE 5 MG PO TABS: 10.0000 mg | ORAL_TABLET | Freq: Every day | ORAL | Status: DC

## 2023-12-14 NOTE — Discharge Summary (Signed)
Physician Discharge Summary  [name] ION:[id number] DOB: 1946/03/22 DOA: 12/13/2023  PCP: [name], MD  Admit date: 12/13/2023 Discharge date: 12/14/2023  Time spent: 35 minutes  Recommendations for Outpatient Follow-up:  Holding Prinzide for now cut back amlodipine to 5 mg and titrate as an outpatient Repeat Chem-7 in about 1 week Requires further management as per Dr. Cherly Hensen on follow-up  Discharge Diagnoses:  MAIN problem for hospitalization   Orthostatic hypotension  Please see below for itemized issues addressed in HOpsital- refer to other progress notes for clarity if needed  Discharge Condition: Improved  Diet recommendation: Heart healthy  There were no vitals filed for this visit.  History of present illness:  78 year old black male Prior GI bleed?  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 Cherly Hensen 11/29/2023-was doing fair-was scheduled for MRI in February to assess response 1/14 called oncology office with cold symptoms some congestion and some clots coming from nose and placed on Mucinex  Called back 1/16 feeling dizzy lightheaded felt like he was about to pass out-ambulance transported him to [facility] ED Reported dizziness palpitations standing no chest pain no shortness of breath neck EKG narrow complex tachycardia -bolused IV fluid fluid status has improved although still a little bit dizzy       BUN/creatinine 22/1.9 above baseline of about 1.4 Lactic acid 1.2 troponin 25 and gray zone-no chest pain Hemoglobin 13.6 platelet 260 TSH 5.0 A1c 7.1        CXR subtle nodular focus left lung base? Overlapping nipple shadow   Patient recovered pretty quickly  during the hospital stay orthostatics repeat after receiving IV fluids during hospital stay were negative for orthostasis he did not feel dizzy he was able to get up a little bit He was ambulated around the unit I had a detailed discussion with him in the presence of his wife-we discussed holding Prinzide for the next several days until follow-up with Dr. Tawni Carnes back amlodipine from 10 to 5 mg and may be increasing back to 10 mg of blood pressures remain elevated above 150 or 160 He had a recent change in dosage of the amlodipine from 5 to 10 mg in the outpatient setting and this may have been too much in addition to taking fexofenadine which has antihistamine and anticholinergic activity-and this may have compounded on his Flomax in addition to his blood pressure meds and caused him to feel ill He is stable for discharge and can be discharged home once he walks around the unit CC Dr. Cherly Hensen   Discharge Exam: Vitals:   12/14/23 0807 12/14/23 0809  BP: (!) 128/91 115/78  Pulse:    Resp:    Temp:    SpO2:      Subj on day of d/c   Awake coherent pleasant  General Exam on discharge  EOMI NCAT no focal deficit no icterus no pallor Chest is clear no wheeze rales rhonchi Abdomen is soft no rebound Power is 5/5 moving 4 limbs equally reflexes are 2/3 vision by direct confrontation is intact ROM is intact Smile is symmetric  Discharge Instructions   Discharge Instructions     Diet - low sodium heart healthy   Complete by: As directed    Discharge instructions  Complete by: As directed    This hospital stay you were diagnosed with low blood pressure on standing something called orthostatic hypotension-this may have been secondary to you having a mild upper respiratory infection and taking medications that sometimes can cause you to lose fluids or become more "dry"-this in addition to your Prinzide probably cause you to feel dizzy and lightheaded I would suggest that you hold the  Prinzide tablet until you follow-up with Dr. Winfred Leeds back your amlodipine to 5 mg and in a week get lab work and we can probably resume your usual medications Please go slowly with activity for the first several days on returning home and then return to work as per usual schedule as long as you are able to slowly increase your activity Please follow-up with Dr. Cherly Hensen in the outpatient setting Good luck   Increase activity slowly   Complete by: As directed       Allergies as of 12/14/2023       Reactions   Bee Venom Anaphylaxis   Foaming at mouth, unresponsive   Aspirin Nausea And Vomiting, Other (See Comments)   Penicillins Itching, Rash        Medication List     STOP taking these medications    lisinopril-hydrochlorothiazide 20-25 MG tablet Commonly known as: ZESTORETIC   MUCINEX ALLERGY PO   prochlorperazine 10 MG tablet Commonly known as: COMPAZINE       TAKE these medications    amLODipine 5 MG tablet Commonly known as: NORVASC Take 2 tablets (10 mg total) by mouth daily. What changed: medication strength   atorvastatin 10 MG tablet Commonly known as: LIPITOR Take 10 mg by mouth in the morning.   EPINEPHrine 0.3 mg/0.3 mL Soaj injection Commonly known as: EPI-PEN Inject 0.3 mg into the muscle as needed for anaphylaxis.   finasteride 5 MG tablet Commonly known as: PROSCAR Take 5 mg by mouth in the morning.   Grape Seed Extract 100 MG Caps Take 300 mg by mouth. 300 mg   latanoprost 0.005 % ophthalmic solution Commonly known as: XALATAN 1 drop at bedtime.   Lenvima (20 MG Daily Dose) 2 x 10 MG capsule Generic drug: lenvatinib 20 mg daily dose Take 2 capsules (20 mg total) by mouth daily. What changed: when to take this   multivitamin with minerals tablet Take 1 tablet by mouth in the morning.   ondansetron 8 MG tablet Commonly known as: ZOFRAN Take 1 tablet (8 mg total) by mouth every 8 (eight) hours as needed for nausea or vomiting.   OVER  THE COUNTER MEDICATION Take 1 tablet by mouth in the morning. Lion's mane supplement   tamsulosin 0.4 MG Caps capsule Commonly known as: FLOMAX Take 0.4 mg by mouth daily after breakfast.       Allergies  Allergen Reactions   Bee Venom Anaphylaxis    Foaming at mouth, unresponsive   Aspirin Nausea And Vomiting and Other (See Comments)   Penicillins Itching and Rash      The results of significant diagnostics from this hospitalization (including imaging, microbiology, ancillary and laboratory) are listed below for reference.    Significant Diagnostic Studies: DG Chest 2 View Result Date: 12/13/2023 CLINICAL DATA:  Tachycardia EXAM: CHEST - 2 VIEW COMPARISON:  X-ray 02/02/2009 FINDINGS: No consolidation, pneumothorax or effusion. Normal cardiopericardial silhouette without edema. Sternal wires. Overlapping cardiac leads. Degenerative changes along the spine. Subtle nodular density along the left lung base overlying the anterior aspect of the left sixth rib.  IMPRESSION: Postop chest. Subtle nodular focus in the left lung base could be overlapping shadow nipple shadows. Follow up one-view x-ray with nipple markers could be performed when clinically appropriate versus oblique imaging or CT. Electronically Signed   By: Karen Kays M.D.   On: 12/13/2023 12:07    Microbiology: No results found for this or any previous visit (from the past 240 hours).   Labs: Basic Metabolic Panel: Recent Labs  Lab 12/13/23 1114 12/13/23 1840 12/14/23 0425  NA 136  --  136  K 3.9  --  3.6  CL 102  --  107  CO2 26  --  20*  GLUCOSE 106*  --  98  BUN 22  --  19  CREATININE 1.92* 1.42* 1.44*  CALCIUM 8.9  --  8.5*  MG  --   --  2.2   Liver Function Tests: Recent Labs  Lab 12/13/23 1114  AST 16  ALT 20  ALKPHOS 86  BILITOT 0.5  PROT 7.6  ALBUMIN 3.3*   No results for input(s): "LIPASE", "AMYLASE" in the last 168 hours. No results for input(s): "AMMONIA" in the last 168  hours. CBC: Recent Labs  Lab 12/13/23 1114 12/13/23 1840 12/14/23 0425  WBC 5.2 6.5 5.5  NEUTROABS 3.3  --   --   HGB 13.6 12.7* 12.4*  HCT 42.9 39.1 39.5  MCV 83.0 83.0 84.8  PLT 260 235 230   Cardiac Enzymes: No results for input(s): "CKTOTAL", "CKMB", "CKMBINDEX", "TROPONINI" in the last 168 hours. BNP: BNP (last 3 results) No results for input(s): "BNP" in the last 8760 hours.  ProBNP (last 3 results) No results for input(s): "PROBNP" in the last 8760 hours.  CBG: No results for input(s): "GLUCAP" in the last 168 hours.  Signed:  Rhetta Mura MD   [facility] 12/14/2023, 8:48 AM

## 2023-12-17 NOTE — Progress Notes (Unsigned)
Patient Care Team: Garrett Joe, MD as PCP - General (Family Medicine)  Clinic Day:  12/20/2023  Referring physician: [name], MD  ASSESSMENT & PLAN:   Assessment & Plan: [name] is a 78 y.o. male with history of DM, HTN, HLD, OSA, and prostate cancer (T1c GG2 GS 3+4) s/p EBRT alone in 2019 and Right papillary RCC s/p nephrectomy in 10/2022 referred to Medical Oncology Clinic for [facility]. Currently with stage IV RCC. Staging imaging show brain metastases, and pulmonary nodules suggestive of lung metastases.   Current diagnosis: stage IV RCC with brain metastases Initial diagnosis: 10/2022. pT3cN0M0 G3 right papillary RCC, type 2 Previous Treatment: 10/27/22 Open radical nephrectomy with IVC repair, cardiac bypass for level 3 thrombus at [facility] Renal function: 10/09/23 Cr. 1.53. Labs: Ca 10.2. WBC 6.7. HGB 13.4 PLT 221. ANC 4.4  Cycle 3 of pembro today. He gets call on lenvatinib delivery.  Renal cell carcinoma, right (HCC) Continue lenvatinib with pembrolizumab  lenvatinib 20 mg daily Monitor for toxicity CT CAP ordered for mid Feb MRI of brain ordered SRS if needed after systemic treatment  At risk for side effect of medication Intense drug monitoring for lenvatinib. Monitor for hypertension with baseline hypertension Baseline EKG normal repeat as indicated Check lab with LFT, renal function, electrolytes, every 2 weeks for 2 months and then monthly thereafter Check TSH and free T4 monthly urine dipstick; if 2+ then obtain a 24-hour urine protein Dental exam periodically Monitor for signs of bleeding, wound healing, thrombosis Follow up 2 weeks on lenvatinib.  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 Continue amlodipine at 10 mg daily    The patient understands the plans discussed today and is in agreement with them.  He knows to contact our  office if he develops concerns prior to his next appointment.  [name], MD  [facility] - A DEPT OF [facility] [address] [place] [address] Dept: [phone] Dept Fax: [phone]   No orders of the defined types were placed in this encounter.   CHIEF COMPLAINT:  CC: mRCC  Current Treatment:  len/pembro  INTERVAL HISTORY:  [name] is here today for repeat clinical assessment. Only missed one day of lenvatinib when in the hospital.   BP at home mostly <140 SBP now w/out ACE inh.    Slight headaches not daily in the back. Resolved on it's own. No visual change or double vision. No CVA like symptoms.   A few blood clot when blowing nose. No bleeding.   No edema, chest pain, short of breath, diarrhea, and open wound.  Some night coughs, no fever.   No difficulty urinating, bloody urine.   No palpitation.   Dental issue or pain.  No rash or joint swelling.  I have reviewed the past medical history, past surgical history, social history and family history with the patient and they are unchanged from previous note.  ALLERGIES:  is allergic to bee venom, aspirin, and penicillins.  MEDICATIONS:  Current Outpatient Medications  Medication Sig Dispense Refill   amLODipine (NORVASC) 5 MG tablet Take 2 tablets (10 mg total) by mouth daily.     atorvastatin (LIPITOR) 10 MG tablet Take 10 mg by mouth in the morning.     EPINEPHrine 0.3 mg/0.3 mL IJ SOAJ injection Inject 0.3 mg into the  muscle as needed for anaphylaxis.     finasteride (PROSCAR) 5 MG tablet Take 5 mg by mouth in the morning.     Grape Seed Extract 100 MG CAPS Take 300 mg by mouth. 300 mg     latanoprost (XALATAN) 0.005 % ophthalmic solution 1 drop at bedtime.     lenvatinib 20 mg daily dose (LENVIMA) 2 x 10 MG capsule Take 2 capsules (20 mg total) by mouth daily. (Patient taking differently: Take 20 mg by mouth in the morning.) 60  capsule 11   Multiple Vitamins-Minerals (MULTIVITAMIN WITH MINERALS) tablet Take 1 tablet by mouth in the morning.     ondansetron (ZOFRAN) 8 MG tablet Take 1 tablet (8 mg total) by mouth every 8 (eight) hours as needed for nausea or vomiting. (Patient not taking: Reported on 12/13/2023) 30 tablet 1   OVER THE COUNTER MEDICATION Take 1 tablet by mouth in the morning. Lion's mane supplement     tamsulosin (FLOMAX) 0.4 MG CAPS capsule Take 0.4 mg by mouth daily after breakfast.      No current facility-administered medications for this visit.   Facility-Administered Medications Ordered in Other Visits  Medication Dose Route Frequency Provider Last Rate Last Admin   0.9 %  sodium chloride infusion   Intravenous Continuous [name], MD 10 mL/hr at 12/20/23 1245 New Bag at 12/20/23 1245   pembrolizumab (KEYTRUDA) 200 mg in sodium chloride 0.9 % 50 mL chemo infusion  200 mg Intravenous Once [name], MD        HISTORY OF PRESENT ILLNESS:   Oncology History  Malignant neoplasm of prostate (HCC)  04/01/2018 Initial Diagnosis   Malignant neoplasm of prostate (HCC)   04/30/2018 Genetic Testing   Multi-Cancer panel (83 genes) @ [facility] - No pathogenic mutations detected Variants of Uncertain Significance in ATM, SDHA and TSC2  Genes Analyzed: 83 genes on [facility]'s Multi-Cancer panel (ALK, APC, ATM, AXIN2, BAP1, BARD1, BLM, BMPR1A, BRCA1, BRCA2, BRIP1, CASR, CDC73, CDH1, CDK4, CDKN1B, CDKN1C, CDKN2A, CEBPA, CHEK2, CTNNA1, DICER1, DIS3L2, EGFR, EPCAM, FH, FLCN, GATA2, GPC3, GREM1, HOXB13, HRAS, KIT, MAX, MEN1, MET, MITF, MLH1, MSH2, MSH3, MSH6, MUTYH, NBN, NF1, NF2, NTHL1, PALB2, PDGFRA, PHOX2B, PMS2, POLD1, POLE, POT1, PRKAR1A, PTCH1, PTEN, RAD50, RAD51C, RAD51D, RB1, RECQL4, RET, RUNX1, SDHA, SDHAF2, SDHB, SDHC, SDHD, SMAD4, SMARCA4, SMARCB1, SMARCE1, STK11, SUFU, TERC, TERT, TMEM127, TP53, TSC1, TSC2, VHL, WRN, WT1).  UPDATE: ATM [id number] VUS was amended to "Likely Benign" due to a  re-review of the evidence in light of new variant interpretation guidelines and/or new information.   Renal cell carcinoma, right (HCC)  09/29/2022 Imaging   CT chest 1.  No evidence of metastatic disease in the thorax.  2.  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 Consider correlation with genetic testing.  3.  Reference same day abdominal MRI for details regarding the right renal mass and associated tumor thrombus in the right renal vein and IVC    09/29/2022 Imaging   MRI 1.  Right interpolar renal 3.5 cm mass concerning for primary renal malignancy. Upper pole and lower pole tumor is favored to primarily be intravenous tumor thrombus, with intravenous tumor expanding the right renal vein, and the subdiaphragmatic IVC as detailed above. Differential includes renal cell carcinoma (favored) and transitional cell carcinoma.  2.  Please see outside 08/25/2022 CT abdomen and pelvis for evaluation of the collecting system on delayed/urographic phase.  3.  No definite lymphadenopathy.  4.  Signal changes in the L5 superior endplate are favored to reflect a fracture. However, recommend continued attention on follow-up.    10/27/2022 Pathology Results   SPECIMEN    Procedure:    Radical nephrectomy     Specimen Laterality:    Right   TUMOR    Tumor Focality:    Unifocal     Tumor Size:    Greatest Dimension (Centimeters): 8.2 cm     Histologic Type:    Papillary renal cell carcinoma, type 2     Histologic Grade (WHO / ISUP):    G3 (nucleoli conspicuous and eosinophilic at 100x magnification)     Tumor Extent:    Extends into perinephric tissue (beyond renal capsule)     Tumor Extent:    Extends into renal sinus     Tumor Extent:    Extends into major vein (renal vein or its segmental branches, inferior vena cava)     Sarcomatoid Features:    Not identified     Rhabdoid Features:    Not identified     Tumor Necrosis:     Not identified     Lymphovascular Invasion:    Present   MARGINS    Margin Status:    Invasive carcinoma present at margin       Margin(s) Involved by Invasive Carcinoma:    Renal vein: present in renal vein nephrectomy margin; No tumor seen is separately submitted renal vein segment.   REGIONAL LYMPH NODES     Regional Lymph Node Status:    Not applicable (no regional lymph nodes submitted or found)   PATHOLOGIC STAGE CLASSIFICATION (pTNM, AJCC 8th Edition)     Reporting of pT, pN, and (when applicable) pM categories is based on information available to the pathologist at the time the report is issued. As per the AJCC (Chapter 1, 8th Ed.) it is the managing physician's responsibility to establish the final  pathologic stage based upon all pertinent information, including but potentially not limited to this pathology report.     Primary Tumor (pT):    pT3b     Regional Lymph Nodes (pN):    pN not assigned (no nodes submitted or found)    03/08/2023 Imaging   CT CAP Status post radical right nephrectomy, IVC repair, and partial right adrenalectomy without evidence of recurrent or metastatic disease.  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  L5 compression deformity without progressive interval height loss  Lungs/Pleura: No large airspace consolidation or pleural effusion. Similar distribution of numerous thin-walled pulmonary cysts throughout the bilateral lungs. Bibasilar atelectasis/scarring. No new pulmonary nodules.    09/13/2023 Imaging   CT CAP 5 mm left lower lobe pulmonary nodule Solid upper lobe pulmonary nodule 3 mm 4 mm right lower lobe nodule Sclerotic focus T1 change from prior likely bone island.  No aggressive lytic lesion.  Multilevel degenerative changes of spine and bilateral shoulder Similar appearance of sclerotic superior endplate deformity at L5 unchanged dating back to September 2023.  New  from April 2019. 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 No pathologically enlarged abdominal or pelvic lymph nodes.   10/08/2023 Initial Diagnosis   Renal cell carcinoma, right (HCC)   11/08/2023 -  Chemotherapy   Patient is on Treatment Plan : BLADDER Pembrolizumab (200) q21d         REVIEW OF SYSTEMS:   All relevant systems were reviewed with the patient and are negative.   VITALS:  Blood pressure (!) 152/90, pulse 62, temperature 97.7 F (36.5 C), temperature source Temporal, resp. rate 16, weight 223 lb 1.6 oz (101.2 kg), SpO2 100%.  Wt Readings from Last 3 Encounters:  12/20/23 223 lb 1.6 oz (101.2 kg)  11/29/23 224 lb 4.8 oz (101.7 kg)  11/16/23 224 lb 12.8 oz (102 kg)    Body mass index is 27.89 kg/m.  Performance status (ECOG): 1 - Symptomatic but completely ambulatory  PHYSICAL EXAM:   GENERAL:alert, no distress and comfortable SKIN: skin color normal, no rashes  EYES: normal, sclera clear OROPHARYNX: no exudate, no erythema    NECK: supple,  non-tender, without nodularity LYMPH:  no palpable cervical lymphadenopathy LUNGS: clear to auscultation with normal breathing effort.  No wheeze or rales HEART: regular rate & rhythm and no murmurs and no lower extremity edema ABDOMEN: abdomen soft, non-tender and nondistended Musculoskeletal: no edema NEURO: alert, fluent speech, no focal motor/sensory deficits.  Strength and sensation equal bilaterally.  LABORATORY DATA:  I have reviewed the data as listed    Component Value Date/Time   NA 138 12/20/2023 1059   K 3.7 12/20/2023 1059   CL 104 12/20/2023 1059   CO2 28 12/20/2023 1059   GLUCOSE 114 (H) 12/20/2023 1059   BUN 19 12/20/2023 1059    CREATININE 1.39 (H) 12/20/2023 1059   CALCIUM 9.3 12/20/2023 1059   PROT 7.7 12/20/2023 1059   ALBUMIN 3.9 12/20/2023 1059   AST 17 12/20/2023 1059   ALT 22 12/20/2023 1059   ALKPHOS 88 12/20/2023 1059   BILITOT 0.4 12/20/2023 1059   GFRNONAA 53 (L) 12/20/2023 1059   GFRAA >60 04/16/2020 2121    No results found for: "SPEP", "UPEP"  Lab Results  Component Value Date   WBC 5.5 12/20/2023   NEUTROABS 3.8 12/20/2023   HGB 12.9 (L) 12/20/2023   HCT 39.9 12/20/2023   MCV 82.1 12/20/2023   PLT 179 12/20/2023      Chemistry      Component Value Date/Time   NA 138 12/20/2023 1059   K 3.7 12/20/2023 1059   CL 104 12/20/2023 1059   CO2 28 12/20/2023 1059   BUN 19 12/20/2023 1059   CREATININE 1.39 (H) 12/20/2023 1059      Component Value Date/Time   CALCIUM 9.3 12/20/2023 1059   ALKPHOS 88 12/20/2023 1059   AST 17 12/20/2023 1059   ALT 22 12/20/2023 1059   BILITOT 0.4 12/20/2023 1059       RADIOGRAPHIC STUDIES: I have personally reviewed the radiological images as listed and agreed with the findings in the report. DG Chest 2 View Result Date: 12/13/2023 CLINICAL DATA:  Tachycardia EXAM: CHEST - 2 VIEW COMPARISON:  X-ray 02/02/2009 FINDINGS: No consolidation, pneumothorax or effusion. Normal cardiopericardial silhouette without edema. Sternal wires. Overlapping cardiac leads. Degenerative changes along the spine. Subtle nodular density along the left lung base overlying the anterior aspect of the left sixth rib. IMPRESSION: Postop chest. Subtle nodular focus in the left lung base could be overlapping shadow nipple shadows. Follow up one-view x-ray with nipple markers could be performed when clinically appropriate versus oblique imaging or CT. Electronically Signed   By: Karen Kays M.D.   On: 12/13/2023 12:07

## 2023-12-19 NOTE — Assessment & Plan Note (Signed)
Intense drug monitoring for lenvatinib. Monitor for hypertension with baseline hypertension Baseline EKG normal repeat as indicated Check lab with LFT, renal function, electrolytes, every 2 weeks for 2 months and then monthly thereafter Check TSH and free T4 monthly urine dipstick; if 2+ then obtain a 24-hour urine protein Dental exam periodically Monitor for signs of bleeding, wound healing, thrombosis Follow up 2 weeks on lenvatinib.

## 2023-12-19 NOTE — Assessment & Plan Note (Signed)
Monitor BP in relation to cancer therapy of lenvatinib. Continue amlodipine at 5 mg daily continue Prinzide 20-25 mg daily

## 2023-12-19 NOTE — Assessment & Plan Note (Signed)
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

## 2023-12-19 NOTE — Assessment & Plan Note (Signed)
Continue lenvatinib with pembrolizumab  lenvatinib 20 mg daily Monitor for toxicity CT CAP ordered for mid Feb MRI of brain ordered SRS if needed after systemic treatment

## 2023-12-20 ENCOUNTER — Other Ambulatory Visit: Payer: Self-pay

## 2023-12-20 ENCOUNTER — Inpatient Hospital Stay: Payer: Medicare PPO

## 2023-12-20 VITALS — BP 152/90 | HR 62 | Temp 97.7°F | Resp 16 | Wt 223.1 lb

## 2023-12-20 DIAGNOSIS — I159 Secondary hypertension, unspecified: Secondary | ICD-10-CM

## 2023-12-20 DIAGNOSIS — N1831 Chronic kidney disease, stage 3a: Secondary | ICD-10-CM | POA: Diagnosis not present

## 2023-12-20 DIAGNOSIS — C641 Malignant neoplasm of right kidney, except renal pelvis: Secondary | ICD-10-CM

## 2023-12-20 DIAGNOSIS — Z9189 Other specified personal risk factors, not elsewhere classified: Secondary | ICD-10-CM

## 2023-12-20 DIAGNOSIS — C61 Malignant neoplasm of prostate: Secondary | ICD-10-CM | POA: Diagnosis not present

## 2023-12-20 LAB — CMP (CANCER CENTER ONLY)
ALT: 22 U/L (ref 0–44)
AST: 17 U/L (ref 15–41)
Albumin: 3.9 g/dL (ref 3.5–5.0)
Alkaline Phosphatase: 88 U/L (ref 38–126)
Anion gap: 6 (ref 5–15)
BUN: 19 mg/dL (ref 8–23)
CO2: 28 mmol/L (ref 22–32)
Calcium: 9.3 mg/dL (ref 8.9–10.3)
Chloride: 104 mmol/L (ref 98–111)
Creatinine: 1.39 mg/dL — ABNORMAL HIGH (ref 0.61–1.24)
GFR, Estimated: 53 mL/min — ABNORMAL LOW (ref >60)
Glucose, Bld: 114 mg/dL — ABNORMAL HIGH (ref 70–99)
Potassium: 3.7 mmol/L (ref 3.5–5.1)
Sodium: 138 mmol/L (ref 135–145)
Total Bilirubin: 0.4 mg/dL (ref 0.0–1.2)
Total Protein: 7.7 g/dL (ref 6.5–8.1)

## 2023-12-20 LAB — TSH: TSH: 4.61 u[IU]/mL — ABNORMAL HIGH (ref 0.350–4.500)

## 2023-12-20 LAB — CBC WITH DIFFERENTIAL (CANCER CENTER ONLY)
Abs Immature Granulocytes: 0.01 10*3/uL (ref 0.00–0.07)
Basophils Absolute: 0 10*3/uL (ref 0.0–0.1)
Basophils Relative: 1 %
Eosinophils Absolute: 0.2 10*3/uL (ref 0.0–0.5)
Eosinophils Relative: 3 %
HCT: 39.9 % (ref 39.0–52.0)
Hemoglobin: 12.9 g/dL — ABNORMAL LOW (ref 13.0–17.0)
Immature Granulocytes: 0 %
Lymphocytes Relative: 20 %
Lymphs Abs: 1.1 10*3/uL (ref 0.7–4.0)
MCH: 26.5 pg (ref 26.0–34.0)
MCHC: 32.3 g/dL (ref 30.0–36.0)
MCV: 82.1 fL (ref 80.0–100.0)
Monocytes Absolute: 0.3 10*3/uL (ref 0.1–1.0)
Monocytes Relative: 6 %
Neutro Abs: 3.8 10*3/uL (ref 1.7–7.7)
Neutrophils Relative %: 70 %
Platelet Count: 179 10*3/uL (ref 150–400)
RBC: 4.86 MIL/uL (ref 4.22–5.81)
RDW: 15.8 % — ABNORMAL HIGH (ref 11.5–15.5)
WBC Count: 5.5 10*3/uL (ref 4.0–10.5)
nRBC: 0 % (ref 0.0–0.2)

## 2023-12-20 LAB — LACTATE DEHYDROGENASE: LDH: 159 U/L (ref 98–192)

## 2023-12-20 MED ORDER — SODIUM CHLORIDE 0.9 % IV SOLN
200.0000 mg | Freq: Once | INTRAVENOUS | Status: AC
  Administered 2023-12-20: 200 mg via INTRAVENOUS
  Filled 2023-12-20: qty 8

## 2023-12-20 MED ORDER — SODIUM CHLORIDE 0.9 % IV SOLN: INTRAVENOUS | Status: DC

## 2023-12-20 NOTE — Addendum Note (Signed)
Addended by: Geanie Berlin on: 12/20/2023 03:00 PM   Modules accepted: Orders

## 2023-12-20 NOTE — Patient Instructions (Signed)

## 2023-12-21 LAB — T4: T4, Total: 5.7 ug/dL (ref 4.5–12.0)

## 2023-12-25 ENCOUNTER — Other Ambulatory Visit (HOSPITAL_COMMUNITY): Payer: Self-pay | Admitting: Pharmacy Technician

## 2023-12-25 ENCOUNTER — Other Ambulatory Visit (HOSPITAL_COMMUNITY): Payer: Self-pay

## 2023-12-25 NOTE — Progress Notes (Signed)
Specialty Pharmacy Refill Coordination Note  [name] is a 78 y.o. male contacted today regarding refills of specialty medication(s) Lenvatinib Mesylate [facility])   Patient requested Delivery   Delivery date: 01/02/24   Verified address: Patient address [address] [place]   Medication will be filled on 01/01/24.

## 2023-12-26 ENCOUNTER — Telehealth: Payer: Self-pay | Admitting: *Deleted

## 2023-12-26 NOTE — Progress Notes (Unsigned)
Patient Care Team: [name], MD as PCP - General (Family Medicine)  Clinic Day:  12/27/2023  Referring physician: [name], MD  ASSESSMENT & PLAN:   Assessment & Plan: [name] is a 77 y.o. 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 Currently with stage IV RCC. Staging imaging show brain metastases, and pulmonary nodules suggestive of lung metastases.   Current diagnosis: stage IV RCC with brain metastases Initial diagnosis: 10/2022. pT3cN0M0 G3 right papillary RCC, type 2 Previous Treatment: 10/27/22 Open radical nephrectomy with IVC repair, cardiac bypass for level 3 thrombus at [facility] Renal function: 10/09/23 Cr. 1.53. Labs: Ca 10.2. WBC 6.7. HGB 13.4 PLT 221. ANC 4.4   Completed Cycle 3 of pembro.   Return due to uncontrolled BP.  Previously hypotensive resulting in ED admission.  Lisinopril/hydrochlorothiazide was held on discharge.  Now hypertensive.  Continue to monitor at home.  Will refer to nephrology for management.  HTN (hypertension) Monitor BP in relation to cancer therapy of lenvatinib. Continue amlodipine at 10 mg daily Lisinopril-hydrochlorothiazide 25 mg daily. If morning SBP <120-140, only take the amlodipine.  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 Fluid goal 64 oz per day Monitor for hypertension with baseline hypertension Baseline EKG normal repeat as indicated Check lab with LFT, renal function, electrolytes, every 2 weeks for 2 months and then  monthly thereafter Check TSH and free T4 monthly urine dipstick; if 2+ then obtain a 24-hour urine protein Dental exam periodically Monitor for signs of bleeding, wound healing, thrombosis Follow up 2 weeks on lenvatinib.    The patient understands the plans discussed today and is in agreement with them.  He knows to contact our office if he develops concerns prior to his next appointment.  [name], MD  [facility] [address] [place] [address] Dept: [phone] Dept Fax: [phone]   Orders Placed This Encounter  Procedures   Ambulatory referral to Nephrology    Referral Priority:   Routine    Referral Type:   Consultation    Referral Reason:   Specialty Services Required    Requested Specialty:   Nephrology    Number of Visits Requested:   1      CHIEF COMPLAINT:  CC: RCC with hypertension  Current Treatment: Pembrolizumab with lenvatinib  INTERVAL HISTORY:  [name] is here today for repeat clinical assessment.  BP at home range from 140's - 160's SBP. Report an episode 177 and 190's with headaches. No dizziness.  Report of nausea and needed zofran yesterday. No stomach pain or fever. It resolved. This morning he feels fine and took amlodipine 10 mg and lisinopril-hydrochlorothiazide this morning. BP 144/87 and 160 SBP here.  Report he is eating less.  No fever, chills or diarrhea.  I have reviewed the past medical history, past surgical history, social history and family history with the patient and they are unchanged from previous note.  ALLERGIES:  is allergic to bee venom, aspirin, and penicillins.  MEDICATIONS:  Current Outpatient Medications  Medication Sig Dispense Refill   lisinopril-hydrochlorothiazide (ZESTORETIC) 20-25 MG tablet 1 tablet Orally Once a day for 90 days     amLODipine (NORVASC) 5 MG tablet Take 2 tablets (10 mg total) by mouth daily.      atorvastatin (LIPITOR) 10 MG tablet Take 10 mg by mouth in the morning.     EPINEPHrine 0.3 mg/0.3 mL IJ SOAJ injection Inject 0.3 mg into the muscle as needed for anaphylaxis.     finasteride (PROSCAR) 5 MG tablet Take 5 mg by mouth in the morning.     Grape Seed Extract 100 MG CAPS Take 300 mg by mouth. 300 mg     latanoprost (XALATAN) 0.005 % ophthalmic solution 1 drop at bedtime.     lenvatinib 20 mg daily dose (LENVIMA) 2 x 10 MG capsule Take 2 capsules (20 mg total) by mouth daily. (Patient taking differently: Take 20 mg by mouth in the morning.) 60 capsule 11   Multiple Vitamins-Minerals (MULTIVITAMIN WITH MINERALS) tablet Take 1 tablet by mouth in the morning.     ondansetron (ZOFRAN) 8 MG tablet Take 1 tablet (8 mg total) by mouth every 8 (eight) hours as needed for nausea or vomiting. (Patient not taking: Reported on 12/13/2023) 30 tablet 1   OVER THE COUNTER MEDICATION Take 1 tablet by mouth in the morning. Lion's mane supplement     tamsulosin (FLOMAX) 0.4 MG CAPS capsule Take 0.4 mg by mouth daily after breakfast.      No current facility-administered medications for this visit.    HISTORY OF PRESENT ILLNESS:   Oncology History  Malignant neoplasm of prostate (HCC)  04/01/2018 Initial Diagnosis   Malignant neoplasm of prostate (HCC)   04/30/2018 Genetic Testing   Multi-Cancer panel (83 genes) @ [facility] - No pathogenic mutations detected Variants of Uncertain Significance in ATM, SDHA and TSC2  Genes Analyzed: 83 genes on [facility]'s Multi-Cancer panel (ALK, APC, ATM, AXIN2, BAP1, BARD1, BLM, BMPR1A, BRCA1, BRCA2, BRIP1, CASR, CDC73, CDH1, CDK4, CDKN1B, CDKN1C, CDKN2A, CEBPA, CHEK2, CTNNA1, DICER1, DIS3L2, EGFR, EPCAM, FH, FLCN, GATA2, GPC3, GREM1, HOXB13, HRAS, KIT, MAX, MEN1, MET, MITF, MLH1, MSH2, MSH3, MSH6, MUTYH, NBN, NF1, NF2, NTHL1, PALB2, PDGFRA, PHOX2B, PMS2, POLD1, POLE, POT1, PRKAR1A, PTCH1, PTEN, RAD50, RAD51C, RAD51D, RB1, RECQL4, RET, RUNX1, SDHA, SDHAF2, SDHB, SDHC,  SDHD, SMAD4, SMARCA4, SMARCB1, SMARCE1, STK11, SUFU, TERC, TERT, TMEM127, TP53, TSC1, TSC2, VHL, WRN, WT1).  UPDATE: ATM [id number] VUS was amended to "Likely Benign" due to a re-review of the evidence in light of new variant interpretation guidelines and/or new information.   Renal cell carcinoma, right (HCC)  09/29/2022 Imaging   CT chest 1.  No evidence of metastatic disease in the thorax.  2.  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 Consider correlation with genetic testing.  3.  Reference same day abdominal MRI for details regarding the right renal mass and associated tumor thrombus in the right renal vein and IVC    09/29/2022 Imaging   MRI 1.  Right interpolar renal 3.5 cm mass concerning for primary renal malignancy. Upper pole and lower pole tumor is favored to primarily be intravenous tumor thrombus, with intravenous tumor expanding the right renal vein, and the subdiaphragmatic IVC as detailed above. Differential includes renal cell carcinoma (favored) and transitional cell carcinoma.  2.  Please see outside 08/25/2022 CT abdomen and pelvis for  evaluation of the collecting system on delayed/urographic phase.  3.  No definite lymphadenopathy.  4.  Signal changes in the L5 superior endplate are favored to reflect a fracture. However, recommend continued attention on follow-up.    10/27/2022 Pathology Results   SPECIMEN    Procedure:    Radical nephrectomy     Specimen Laterality:    Right   TUMOR    Tumor Focality:    Unifocal     Tumor Size:    Greatest Dimension (Centimeters): 8.2 cm     Histologic Type:    Papillary renal cell carcinoma, type 2     Histologic Grade (WHO / ISUP):    G3 (nucleoli conspicuous and eosinophilic at 100x magnification)     Tumor Extent:    Extends into perinephric tissue (beyond renal capsule)     Tumor Extent:    Extends into renal sinus     Tumor Extent:    Extends into  major vein (renal vein or its segmental branches, inferior vena cava)     Sarcomatoid Features:    Not identified     Rhabdoid Features:    Not identified     Tumor Necrosis:    Not identified     Lymphovascular Invasion:    Present   MARGINS    Margin Status:    Invasive carcinoma present at margin       Margin(s) Involved by Invasive Carcinoma:    Renal vein: present in renal vein nephrectomy margin; No tumor seen is separately submitted renal vein segment.   REGIONAL LYMPH NODES     Regional Lymph Node Status:    Not applicable (no regional lymph nodes submitted or found)   PATHOLOGIC STAGE CLASSIFICATION (pTNM, AJCC 8th Edition)     Reporting of pT, pN, and (when applicable) pM categories is based on information available to the pathologist at the time the report is issued. As per the AJCC (Chapter 1, 8th Ed.) it is the managing physician's responsibility to establish the final  pathologic stage based upon all pertinent information, including but potentially not limited to this pathology report.     Primary Tumor (pT):    pT3b     Regional Lymph Nodes (pN):    pN not assigned (no nodes submitted or found)    03/08/2023 Imaging   CT CAP Status post radical right nephrectomy, IVC repair, and partial right adrenalectomy without evidence of recurrent or metastatic disease.  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  L5 compression deformity without progressive interval height loss  Lungs/Pleura: No large airspace consolidation or pleural effusion. Similar distribution of numerous thin-walled pulmonary cysts throughout the bilateral lungs. Bibasilar atelectasis/scarring. No new pulmonary nodules.    09/13/2023 Imaging   CT CAP 5 mm left lower lobe pulmonary nodule Solid upper lobe pulmonary nodule 3 mm 4 mm right lower lobe nodule Sclerotic focus T1 change from prior likely bone island.  No aggressive lytic lesion.   Multilevel degenerative changes of spine and bilateral shoulder Similar appearance of sclerotic superior endplate deformity at L5 unchanged dating back to September 2023.  New from April 2019. 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 No pathologically enlarged abdominal or pelvic lymph  nodes.   10/08/2023 Initial Diagnosis   Renal cell carcinoma, right (HCC)   11/08/2023 -  Chemotherapy   Patient is on Treatment Plan : BLADDER Pembrolizumab (200) q21d         REVIEW OF SYSTEMS:   All relevant systems were reviewed with the patient and are negative.   VITALS:  Blood pressure (!) 160/92, pulse 71, temperature 98.1 F (36.7 C), temperature source Temporal, resp. rate 17, SpO2 100%.  Wt Readings from Last 3 Encounters:  12/20/23 223 lb 1.6 oz (101.2 kg)  11/29/23 224 lb 4.8 oz (101.7 kg)  11/16/23 224 lb 12.8 oz (102 kg)    There is no height or weight on file to calculate BMI.  Performance status (ECOG):0  PHYSICAL EXAM:   GENERAL:alert, no distress and comfortable Musculoskeletal: no edema NEURO: alert, fluent speech  LABORATORY DATA:  I have reviewed the data as listed    Component Value Date/Time   NA 138 12/20/2023 1059   K 3.7 12/20/2023 1059   CL 104 12/20/2023 1059   CO2 28 12/20/2023 1059   GLUCOSE 114 (H) 12/20/2023 1059   BUN 19 12/20/2023 1059   CREATININE 1.39 (H) 12/20/2023 1059   CALCIUM 9.3 12/20/2023 1059   PROT 7.7 12/20/2023 1059   ALBUMIN 3.9 12/20/2023 1059   AST 17 12/20/2023 1059   ALT 22 12/20/2023 1059   ALKPHOS 88 12/20/2023 1059   BILITOT 0.4 12/20/2023 1059   GFRNONAA 53 (L) 12/20/2023 1059   GFRAA >60 04/16/2020 2121    No results found for: "SPEP", "UPEP"  Lab Results   Component Value Date   WBC 5.5 12/20/2023   NEUTROABS 3.8 12/20/2023   HGB 12.9 (L) 12/20/2023   HCT 39.9 12/20/2023   MCV 82.1 12/20/2023   PLT 179 12/20/2023      Chemistry      Component Value Date/Time   NA 138 12/20/2023 1059   K 3.7 12/20/2023 1059   CL 104 12/20/2023 1059   CO2 28 12/20/2023 1059   BUN 19 12/20/2023 1059   CREATININE 1.39 (H) 12/20/2023 1059      Component Value Date/Time   CALCIUM 9.3 12/20/2023 1059   ALKPHOS 88 12/20/2023 1059   AST 17 12/20/2023 1059   ALT 22 12/20/2023 1059   BILITOT 0.4 12/20/2023 1059       RADIOGRAPHIC STUDIES: I have personally reviewed the radiological images as listed and agreed with the findings in the report. DG Chest 2 View Result Date: 12/13/2023 CLINICAL DATA:  Tachycardia EXAM: CHEST - 2 VIEW COMPARISON:  X-ray 02/02/2009 FINDINGS: No consolidation, pneumothorax or effusion. Normal cardiopericardial silhouette without edema. Sternal wires. Overlapping cardiac leads. Degenerative changes along the spine. Subtle nodular density along the left lung base overlying the anterior aspect of the left sixth rib. IMPRESSION: Postop chest. Subtle nodular focus in the left lung base could be overlapping shadow nipple shadows. Follow up one-view x-ray with nipple markers could be performed when clinically appropriate versus oblique imaging or CT. Electronically Signed   By: Karen Kays M.D.   On: 12/13/2023 12:07

## 2023-12-26 NOTE — Telephone Encounter (Signed)
Returned PC to patient, his wife called earlier stating his BP was elevated & that his BP meds were recently changed.  Patient states his BP was 177/94 this morning & 191/96 this afternoon.  He is taking amlodopine 10 mg daily, was previously taking lisinopril but this was DC'd during recent ER visit.  Per Dr Cherly Hensen, patient is to continue taking his amlodopine as prescribed & to take his lisinopril-hydrochlorothiazide 20/25 mg now.  He is to check his BP in the morning, if diastolic BP is greater than 170, he is to take lisinopril-hydrochlorothiazide 20/25 mg again in am.  Dr Cherly Hensen will see patient in the morning at 8:30, appointment scheduled, patient informed.  He verbalizes understanding.

## 2023-12-26 NOTE — Assessment & Plan Note (Signed)
Intense drug monitoring for lenvatinib. Monitor for hypertension with baseline hypertension Baseline EKG normal repeat as indicated Check lab with LFT, renal function, electrolytes, every 2 weeks for 2 months and then monthly thereafter Check TSH and free T4 monthly urine dipstick; if 2+ then obtain a 24-hour urine protein Dental exam periodically Monitor for signs of bleeding, wound healing, thrombosis Follow up 2 weeks on lenvatinib.

## 2023-12-26 NOTE — Assessment & Plan Note (Signed)
Continue lenvatinib with pembrolizumab  lenvatinib 20 mg daily Monitor for toxicity CT CAP ordered for mid Feb MRI of brain ordered SRS if needed after systemic treatment

## 2023-12-26 NOTE — Assessment & Plan Note (Signed)
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

## 2023-12-26 NOTE — Telephone Encounter (Signed)
This nurse completed [facility] Cancer Claim policy: [id number] on remote workday 12/25/2023.  Successfully returned  today via fax [phone]) with treatment plan and the 10/22/2023 CT scan of chest, abdomen and pelvis.  Since [facility] does not manage billing, comment on cover sheet reads "to obtain itemized billing statement(s) from patient.   Copy to [facility] H.I.M. bin for items to be scanned.  Mailing information to [employer] address on file completes process. [address] [place] [address]

## 2023-12-26 NOTE — Assessment & Plan Note (Signed)
Monitor BP in relation to cancer therapy of lenvatinib. Continue amlodipine at 10 mg daily Lisinopril-hydrochlorothiazide 25 mg daily. If morning SBP <120-140, only take the amlodipine.

## 2023-12-27 ENCOUNTER — Inpatient Hospital Stay: Payer: Medicare PPO

## 2023-12-27 VITALS — BP 160/92 | HR 71 | Temp 98.1°F | Resp 17

## 2023-12-27 DIAGNOSIS — C61 Malignant neoplasm of prostate: Secondary | ICD-10-CM | POA: Diagnosis not present

## 2023-12-27 DIAGNOSIS — C641 Malignant neoplasm of right kidney, except renal pelvis: Secondary | ICD-10-CM

## 2023-12-27 DIAGNOSIS — Z9189 Other specified personal risk factors, not elsewhere classified: Secondary | ICD-10-CM | POA: Diagnosis not present

## 2023-12-27 DIAGNOSIS — N1831 Chronic kidney disease, stage 3a: Secondary | ICD-10-CM | POA: Diagnosis not present

## 2023-12-27 DIAGNOSIS — I159 Secondary hypertension, unspecified: Secondary | ICD-10-CM | POA: Diagnosis not present

## 2024-01-01 ENCOUNTER — Other Ambulatory Visit: Payer: Self-pay

## 2024-01-04 ENCOUNTER — Telehealth: Payer: Self-pay

## 2024-01-06 ENCOUNTER — Other Ambulatory Visit: Payer: Self-pay

## 2024-01-07 ENCOUNTER — Ambulatory Visit (HOSPITAL_COMMUNITY)
Admission: RE | Admit: 2024-01-07 | Discharge: 2024-01-07 | Disposition: A | Discharge status: Home / Self Care | Payer: Medicare PPO | Source: Ambulatory Visit

## 2024-01-07 ENCOUNTER — Other Ambulatory Visit: Payer: Self-pay

## 2024-01-07 DIAGNOSIS — C641 Malignant neoplasm of right kidney, except renal pelvis: Secondary | ICD-10-CM | POA: Insufficient documentation

## 2024-01-07 MED ORDER — GADOBUTROL 1 MMOL/ML IV SOLN
10.0000 mL | Freq: Once | INTRAVENOUS | Status: AC | PRN
  Administered 2024-01-07: 10 mL via INTRAVENOUS

## 2024-01-08 ENCOUNTER — Inpatient Hospital Stay: Payer: Medicare PPO

## 2024-01-10 ENCOUNTER — Inpatient Hospital Stay: Payer: Medicare PPO

## 2024-01-10 VITALS — BP 131/79 | HR 77 | Temp 98.3°F | Resp 16

## 2024-01-10 DIAGNOSIS — C61 Malignant neoplasm of prostate: Secondary | ICD-10-CM | POA: Diagnosis present

## 2024-01-10 DIAGNOSIS — C641 Malignant neoplasm of right kidney, except renal pelvis: Secondary | ICD-10-CM

## 2024-01-10 DIAGNOSIS — Z5112 Encounter for antineoplastic immunotherapy: Secondary | ICD-10-CM | POA: Diagnosis not present

## 2024-01-10 LAB — CBC WITH DIFFERENTIAL (CANCER CENTER ONLY)
Abs Immature Granulocytes: 0.02 10*3/uL (ref 0.00–0.07)
Basophils Absolute: 0 10*3/uL (ref 0.0–0.1)
Basophils Relative: 0 %
Eosinophils Absolute: 0.1 10*3/uL (ref 0.0–0.5)
Eosinophils Relative: 3 %
HCT: 39.3 % (ref 39.0–52.0)
Hemoglobin: 12.7 g/dL — ABNORMAL LOW (ref 13.0–17.0)
Immature Granulocytes: 0 %
Lymphocytes Relative: 20 %
Lymphs Abs: 1.1 10*3/uL (ref 0.7–4.0)
MCH: 26.7 pg (ref 26.0–34.0)
MCHC: 32.3 g/dL (ref 30.0–36.0)
MCV: 82.6 fL (ref 80.0–100.0)
Monocytes Absolute: 0.4 10*3/uL (ref 0.1–1.0)
Monocytes Relative: 7 %
Neutro Abs: 3.8 10*3/uL (ref 1.7–7.7)
Neutrophils Relative %: 70 %
Platelet Count: 277 10*3/uL (ref 150–400)
RBC: 4.76 MIL/uL (ref 4.22–5.81)
RDW: 15.9 % — ABNORMAL HIGH (ref 11.5–15.5)
WBC Count: 5.5 10*3/uL (ref 4.0–10.5)
nRBC: 0 % (ref 0.0–0.2)

## 2024-01-10 LAB — CMP (CANCER CENTER ONLY)
ALT: 27 U/L (ref 0–44)
AST: 17 U/L (ref 15–41)
Albumin: 3.9 g/dL (ref 3.5–5.0)
Alkaline Phosphatase: 134 U/L — ABNORMAL HIGH (ref 38–126)
Anion gap: 6 (ref 5–15)
BUN: 17 mg/dL (ref 8–23)
CO2: 32 mmol/L (ref 22–32)
Calcium: 9.3 mg/dL (ref 8.9–10.3)
Chloride: 97 mmol/L — ABNORMAL LOW (ref 98–111)
Creatinine: 1.48 mg/dL — ABNORMAL HIGH (ref 0.61–1.24)
GFR, Estimated: 49 mL/min — ABNORMAL LOW (ref >60)
Glucose, Bld: 135 mg/dL — ABNORMAL HIGH (ref 70–99)
Potassium: 3.6 mmol/L (ref 3.5–5.1)
Sodium: 135 mmol/L (ref 135–145)
Total Bilirubin: 0.4 mg/dL (ref 0.0–1.2)
Total Protein: 7.9 g/dL (ref 6.5–8.1)

## 2024-01-10 LAB — LACTATE DEHYDROGENASE: LDH: 135 U/L (ref 98–192)

## 2024-01-10 MED ORDER — SODIUM CHLORIDE 0.9 % IV SOLN: INTRAVENOUS | Status: DC

## 2024-01-10 MED ORDER — SODIUM CHLORIDE 0.9 % IV SOLN
200.0000 mg | Freq: Once | INTRAVENOUS | Status: AC
  Administered 2024-01-10: 200 mg via INTRAVENOUS
  Filled 2024-01-10: qty 200

## 2024-01-10 NOTE — Patient Instructions (Signed)

## 2024-01-17 ENCOUNTER — Ambulatory Visit: Payer: Medicare PPO | Admitting: Audiologist

## 2024-01-17 ENCOUNTER — Ambulatory Visit (HOSPITAL_COMMUNITY): Payer: Medicare PPO

## 2024-01-17 ENCOUNTER — Telehealth: Payer: Self-pay | Admitting: *Deleted

## 2024-01-17 NOTE — Telephone Encounter (Addendum)
 [name] in this nurse office with letter he received from [facility] requesting further information and billing. No form.  Connected with [facility] representative [place] [phone]).for further information. "No form.  We need clarification despite the diagnosis and ICD 10 code for clarification/  Letter on your letterhead that reads what Pembrolizumab is ordered for.  May use the wording we used on the fax received.  Treatments may be scheduled and not received is why we request billing and codes.  Since billing is not available you can send office notes for [place] to explore and figure out he actually received treatment."

## 2024-01-20 ENCOUNTER — Other Ambulatory Visit: Payer: Self-pay

## 2024-01-22 ENCOUNTER — Other Ambulatory Visit: Payer: Self-pay | Admitting: Pharmacy Technician

## 2024-01-22 ENCOUNTER — Other Ambulatory Visit (HOSPITAL_COMMUNITY): Payer: Self-pay

## 2024-01-22 ENCOUNTER — Other Ambulatory Visit: Payer: Self-pay

## 2024-01-22 NOTE — Progress Notes (Signed)
 Specialty Pharmacy Refill Coordination Note  [name] is a 78 y.o. male contacted today regarding refills of specialty medication(s) Lenvatinib Mesylate (LENVIMA)   Patient requested Pickup at [facility] date: 01/24/24   Medication will be filled on 01/24/24.

## 2024-01-24 ENCOUNTER — Other Ambulatory Visit (HOSPITAL_COMMUNITY): Payer: Self-pay

## 2024-01-24 ENCOUNTER — Other Ambulatory Visit: Payer: Self-pay

## 2024-01-25 ENCOUNTER — Ambulatory Visit (HOSPITAL_COMMUNITY)
Admission: RE | Admit: 2024-01-25 | Discharge: 2024-01-25 | Disposition: A | Discharge status: Home / Self Care | Payer: Medicare PPO | Source: Ambulatory Visit

## 2024-01-25 ENCOUNTER — Encounter: Payer: Self-pay | Admitting: *Deleted

## 2024-01-25 ENCOUNTER — Encounter (HOSPITAL_COMMUNITY): Payer: Self-pay

## 2024-01-25 DIAGNOSIS — C641 Malignant neoplasm of right kidney, except renal pelvis: Secondary | ICD-10-CM | POA: Insufficient documentation

## 2024-01-25 MED ORDER — IOHEXOL 300 MG/ML  SOLN
80.0000 mL | Freq: Once | INTRAMUSCULAR | Status: AC | PRN
  Administered 2024-01-25: 80 mL via INTRAVENOUS

## 2024-01-25 NOTE — Telephone Encounter (Signed)
 Processed letter per [facility] representative instructions for clarification.  Also routed completed infusion treatment summaries to support what Alontae Chaloux Whitt has received and will be billed for.  Patient asked this nurse to return his signed [facility] Authorization form.  Faxed information received by Guy Sandifer Koslowski with copy of letter,  copy of oncology active treatment plan and copy of completed appointments for 11/08/2023, 11/29/2023, 12/20/2023 and 01/10/2024.  Copy to [facility] H.I.M bin designated for items to be scanned.  Envelope to [facility] File Folder near appointment registrar number one for patient pick up.  Message left for Hal Norrington Lasala ([phone]) to pick up on 01/31/2024.  No further instructions received, actions performed or required by this nurse.

## 2024-01-30 DIAGNOSIS — K838 Other specified diseases of biliary tract: Secondary | ICD-10-CM | POA: Insufficient documentation

## 2024-01-30 DIAGNOSIS — C649 Malignant neoplasm of unspecified kidney, except renal pelvis: Secondary | ICD-10-CM | POA: Insufficient documentation

## 2024-01-30 NOTE — Progress Notes (Unsigned)
 Patient Care Team: Garrett Joe, MD as PCP - General (Family Medicine)  Clinic Day:  01/31/2024  Referring physician: [name], MD  ASSESSMENT & PLAN:   Assessment & Plan: [name] is a 78 y.o. male with history of DM, HTN, HLD, OSA, and prostate cancer (T1c GG2 GS 3+4) s/p EBRT alone in 2019 and Right papillary RCC s/p nephrectomy in 10/2022 referred to Medical Oncology Clinic for [name]. Currently with stage IV RCC. Staging imaging showed brain metastases, and pulmonary nodules suggestive of lung metastases.   Current diagnosis: stage IV RCC with brain metastases Initial diagnosis: 10/2022. [id number] G3 right papillary RCC, type 2 Previous Treatment: 10/27/22 Open radical nephrectomy with IVC repair, cardiac bypass for level 3 thrombus at [facility] Renal function: 10/09/23 Cr. 1.53. Labs: Ca 10.2. WBC 6.7. HGB 13.4 PLT 221. ANC 4.4  CT showed stable pulmonary nodules. No signs of disease progression. Discussed biliary ductal dilatation, questionable stone and soft tissue nodule along the superolateral aspect of the right psoas muscle measures 9 x 20 mm. Obtain MRCP.  Renal cell carcinoma, right (HCC) Continue lenvatinib with pembrolizumab  lenvatinib 20 mg daily Monitor for toxicity CT CAP to be scheduled in 3 months MRI of brain in April SRS if needed after systemic treatment  HTN (hypertension) Report SBP 110-130's at home with current both medication. Monitor BP in relation to cancer therapy of lenvatinib. Continue amlodipine at 10 mg daily Lisinopril-hydrochlorothiazide 25 mg daily. If morning SBP <120-140, only take the amlodipine.  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 Fluid goal 64 oz per day Monitor for hypertension with baseline hypertension Baseline EKG normal repeat as indicated Check lab  with LFT, renal function, electrolytes, every 2 weeks for 2 months and then monthly thereafter Check TSH and free T4 monthly urine dipstick; if 2+ then obtain a 24-hour urine protein Dental exam periodically Monitor for signs of bleeding, wound healing, thrombosis Follow up 2 weeks on lenvatinib.  Constipation Miralax as needed.  GERD (gastroesophageal reflux disease) Symptoms of GERD and acid reflux Start omeprazole 20 mg daily in the morning.    The patient understands the plans discussed today and is in agreement with them.  He knows to contact our office if he develops concerns prior to his next appointment.  Garrett Sartorius, MD  [facility] [facility] - A DEPT OF [facility] [address] [place] [address] Dept: [phone] Dept Fax: [phone]   Orders Placed This Encounter  Procedures   MR ABDOMEN WITH MRCP W CONTRAST    Standing Status:   Future    Expected Date:   02/07/2024    Expiration Date:   01/30/2025    Reason for Exam (SYMPTOM  OR DIAGNOSIS REQUIRED):   ductal dilation with question on stones vs mass, history of R kidney cancer    If indicated for the ordered procedure, I authorize the administration of contrast media per Radiology protocol:   Yes    What is the patient's sedation requirement?:   No Sedation    Does the patient have a pacemaker or implanted devices?:   No    Preferred imaging location?:   [facility] (table limit - 550 lbs)      CHIEF COMPLAINT:  CC: mRCC  Current Treatment:  len/pembro  INTERVAL HISTORY:  [name] is here today for repeat clinical assessment. He denies fevers or chills. Report some appetite is down.  No coughing, chest pain, headache, double vision, focal weakness, trouble urinating. No bloody urine or stool No stomach pain, just nausea sometimes. No vomiting, diarrhea but constipation.  I have reviewed the past medical history, past surgical history,  social history and family history with the patient and they are unchanged from previous note.  ALLERGIES:  is allergic to bee venom, aspirin, and penicillins.  MEDICATIONS:  Current Outpatient Medications  Medication Sig Dispense Refill   omeprazole (PRILOSEC) 20 MG capsule Take 1 capsule (20 mg total) by mouth daily. 30 capsule 0   amLODipine (NORVASC) 5 MG tablet Take 2 tablets (10 mg total) by mouth daily.     atorvastatin (LIPITOR) 10 MG tablet Take 10 mg by mouth in the morning.     EPINEPHrine 0.3 mg/0.3 mL IJ SOAJ injection Inject 0.3 mg into the muscle as needed for anaphylaxis.     finasteride (PROSCAR) 5 MG tablet Take 5 mg by mouth in the morning.     Grape Seed Extract 100 MG CAPS Take 300 mg by mouth. 300 mg     latanoprost (XALATAN) 0.005 % ophthalmic solution 1 drop at bedtime.     lenvatinib 20 mg daily dose (LENVIMA) 2 x 10 MG capsule Take 2 capsules (20 mg total) by mouth daily. (Patient taking differently: Take 20 mg by mouth in the morning.) 60 capsule 11   lisinopril-hydrochlorothiazide (ZESTORETIC) 20-25 MG tablet 1 tablet Orally Once a day for 90 days     Multiple Vitamins-Minerals (MULTIVITAMIN WITH MINERALS) tablet Take 1 tablet by mouth in the morning.     ondansetron (ZOFRAN) 8 MG tablet Take 1 tablet (8 mg total) by mouth every 8 (eight) hours as needed for nausea or vomiting. (Patient not taking: Reported on 12/13/2023) 30 tablet 1   OVER THE COUNTER MEDICATION Take 1 tablet by mouth in the morning. Lion's mane supplement     tamsulosin (FLOMAX) 0.4 MG CAPS capsule Take 0.4 mg by mouth daily after breakfast.      No current facility-administered medications for this visit.   Facility-Administered Medications Ordered in Other Visits  Medication Dose Route Frequency Provider Last Rate Last Admin   0.9 %  sodium chloride infusion   Intravenous Continuous Garrett Sartorius, MD       heparin lock flush 100 unit/mL  500 Units Intracatheter Once PRN Garrett Sartorius, MD        pembrolizumab [facility]) 200 mg in sodium chloride 0.9 % 50 mL chemo infusion  200 mg Intravenous Once Garrett Sartorius, MD       sodium chloride flush (NS) 0.9 % injection 10 mL  10 mL Intracatheter PRN Garrett Sartorius, MD        HISTORY OF PRESENT ILLNESS:   Oncology History  Malignant neoplasm of prostate (HCC)  04/01/2018 Initial Diagnosis   Malignant neoplasm of prostate (HCC)   04/30/2018 Genetic Testing   Multi-Cancer panel (83 genes) @ [facility] - No pathogenic mutations detected Variants of Uncertain Significance in ATM, SDHA and TSC2  Genes Analyzed: 83 genes on [facility]'s Multi-Cancer panel (ALK, APC, ATM, AXIN2, BAP1, BARD1, BLM, BMPR1A, BRCA1, BRCA2, BRIP1, CASR, CDC73, CDH1, CDK4, CDKN1B, CDKN1C, CDKN2A, CEBPA, CHEK2, CTNNA1, DICER1, DIS3L2, EGFR, EPCAM, FH, FLCN, GATA2, GPC3, GREM1, HOXB13, HRAS, KIT, MAX, MEN1, MET, MITF, MLH1, MSH2, MSH3, MSH6, MUTYH, NBN, NF1, NF2, NTHL1, PALB2, PDGFRA, PHOX2B, PMS2,  POLD1, POLE, POT1, PRKAR1A, PTCH1, PTEN, RAD50, RAD51C, RAD51D, RB1, RECQL4, RET, RUNX1, SDHA, SDHAF2, SDHB, SDHC, SDHD, SMAD4, SMARCA4, SMARCB1, SMARCE1, STK11, SUFU, TERC, TERT, TMEM127, TP53, TSC1, TSC2, VHL, WRN, WT1).  UPDATE: ATM [id number] VUS was amended to "Likely Benign" due to a re-review of the evidence in light of new variant interpretation guidelines and/or new information.   Renal cell carcinoma, right (HCC)  09/29/2022 Imaging   CT chest 1.  No evidence of metastatic disease in the thorax.  2.  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 Consider correlation with genetic testing.  3.  Reference same day abdominal MRI for details regarding the right renal mass and associated tumor thrombus in the right renal vein and IVC    09/29/2022 Imaging   MRI 1.  Right interpolar renal 3.5 cm mass concerning for primary renal malignancy. Upper pole and lower pole tumor is favored to primarily be  intravenous tumor thrombus, with intravenous tumor expanding the right renal vein, and the subdiaphragmatic IVC as detailed above. Differential includes renal cell carcinoma (favored) and transitional cell carcinoma.  2.  Please see outside 08/25/2022 CT abdomen and pelvis for evaluation of the collecting system on delayed/urographic phase.  3.  No definite lymphadenopathy.  4.  Signal changes in the L5 superior endplate are favored to reflect a fracture. However, recommend continued attention on follow-up.    10/27/2022 Pathology Results   SPECIMEN    Procedure:    Radical nephrectomy     Specimen Laterality:    Right   TUMOR    Tumor Focality:    Unifocal     Tumor Size:    Greatest Dimension (Centimeters): 8.2 cm     Histologic Type:    Papillary renal cell carcinoma, type 2     Histologic Grade (WHO / ISUP):    G3 (nucleoli conspicuous and eosinophilic at 100x magnification)     Tumor Extent:    Extends into perinephric tissue (beyond renal capsule)     Tumor Extent:    Extends into renal sinus     Tumor Extent:    Extends into major vein (renal vein or its segmental branches, inferior vena cava)     Sarcomatoid Features:    Not identified     Rhabdoid Features:    Not identified     Tumor Necrosis:    Not identified     Lymphovascular Invasion:    Present   MARGINS    Margin Status:    Invasive carcinoma present at margin       Margin(s) Involved by Invasive Carcinoma:    Renal vein: present in renal vein nephrectomy margin; No tumor seen is separately submitted renal vein segment.   REGIONAL LYMPH NODES     Regional Lymph Node Status:    Not applicable (no regional lymph nodes submitted or found)   PATHOLOGIC STAGE CLASSIFICATION (pTNM, AJCC 8th Edition)     Reporting of pT, pN, and (when applicable) pM categories is based on information available to the pathologist at the time the report is issued. As per the AJCC (Chapter 1, 8th Ed.) it is the managing physician's responsibility  to establish the final  pathologic stage based upon all pertinent information, including but potentially not limited to this pathology report.     Primary Tumor (pT):    pT3b     Regional Lymph Nodes (pN):    pN not assigned (no nodes submitted or  found)    03/08/2023 Imaging   CT CAP Status post radical right nephrectomy, IVC repair, and partial right adrenalectomy without evidence of recurrent or metastatic disease.  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  L5 compression deformity without progressive interval height loss  Lungs/Pleura: No large airspace consolidation or pleural effusion. Similar distribution of numerous thin-walled pulmonary cysts throughout the bilateral lungs. Bibasilar atelectasis/scarring. No new pulmonary nodules.    09/13/2023 Imaging   CT CAP 5 mm left lower lobe pulmonary nodule Solid upper lobe pulmonary nodule 3 mm 4 mm right lower lobe nodule Sclerotic focus T1 change from prior likely bone island.  No aggressive lytic lesion.  Multilevel degenerative changes of spine and bilateral shoulder Similar appearance of sclerotic superior endplate deformity at L5 unchanged dating back to September 2023.  New from April 2019. 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 No pathologically enlarged abdominal or pelvic lymph nodes.   10/08/2023 Initial Diagnosis   Renal cell carcinoma, right (HCC)   11/08/2023 -  Chemotherapy   Patient is on Treatment Plan : BLADDER Pembrolizumab (200) q21d         REVIEW OF SYSTEMS:   All relevant systems were reviewed with the patient and are negative.   VITALS:  Blood pressure 139/85, pulse 71,  temperature 97.7 F (36.5 C), temperature source Temporal, resp. rate 14, height 6\' 3"  (1.905 m), weight 209 lb 14.4 oz (95.2 kg), SpO2 100%.  Wt Readings from Last 3 Encounters:  01/31/24 209 lb 14.4 oz (95.2 kg)  12/20/23 223 lb 1.6 oz (101.2 kg)  11/29/23 224 lb 4.8 oz (101.7 kg)    Body mass index is 26.24 kg/m.  Performance status (ECOG): 1 - Symptomatic but completely ambulatory  PHYSICAL EXAM:   GENERAL:alert, no distress and comfortable SKIN: skin color normal EYES: normal, sclera clear OROPHARYNX: no exudate, no erythema    LUNGS: clear to auscultation with normal breathing effort.  No wheeze or rales HEART: regular rate & rhythm and no murmurs and no lower extremity edema ABDOMEN: abdomen soft, non-tender and nondistended Musculoskeletal: no edema NEURO: alert, fluent speech, no focal motor/sensory deficits.  Strength and sensation equal bilaterally.  LABORATORY DATA:  I have reviewed the data as listed    Component Value Date/Time   NA 134 (L) 01/31/2024 0903   K 3.8 01/31/2024 0903   CL 98 01/31/2024 0903   CO2 31 01/31/2024 0903   GLUCOSE 157 (H) 01/31/2024 0903   BUN 19 01/31/2024 0903   CREATININE 1.47 (H) 01/31/2024 0903   CALCIUM 9.3 01/31/2024 0903   PROT 7.8 01/31/2024 0903   ALBUMIN 3.9 01/31/2024 0903   AST 15 01/31/2024 0903   ALT 20 01/31/2024 0903   ALKPHOS 117 01/31/2024 0903   BILITOT 0.4 01/31/2024 0903   GFRNONAA 49 (L) 01/31/2024 0903   GFRAA >60 04/16/2020 2121    No results found for: "SPEP", "UPEP"  Lab Results  Component Value Date   WBC 5.1 01/31/2024   NEUTROABS 3.5 01/31/2024   HGB 12.9 (L) 01/31/2024   HCT 40.6 01/31/2024   MCV 84.6 01/31/2024   PLT 203 01/31/2024      Chemistry      Component Value Date/Time  NA 134 (L) 01/31/2024 0903   K 3.8 01/31/2024 0903   CL 98 01/31/2024 0903   CO2 31 01/31/2024 0903   BUN 19 01/31/2024 0903   CREATININE 1.47 (H) 01/31/2024 0903      Component Value Date/Time   CALCIUM  9.3 01/31/2024 0903   ALKPHOS 117 01/31/2024 0903   AST 15 01/31/2024 0903   ALT 20 01/31/2024 0903   BILITOT 0.4 01/31/2024 0903       RADIOGRAPHIC STUDIES: I have personally reviewed the radiological images as listed and agreed with the findings in the report. CT CHEST ABDOMEN PELVIS W CONTRAST Result Date: 01/30/2024 CLINICAL DATA:  Right renal cell carcinoma. Prostate cancer. Abdominal pain and constipation. * Tracking Code: BO * EXAM: CT CHEST, ABDOMEN, AND PELVIS WITH CONTRAST TECHNIQUE: Multidetector CT imaging of the chest, abdomen and pelvis was performed following the standard protocol during bolus administration of intravenous contrast. RADIATION DOSE REDUCTION: This exam was performed according to the departmental dose-optimization program which includes automated exposure control, adjustment of the mA and/or kV according to patient size and/or use of iterative reconstruction technique. CONTRAST:  80mL OMNIPAQUE IOHEXOL 300 MG/ML  SOLN COMPARISON:  MR abdomen 10/16/2023, CT chest abdomen pelvis 09/13/2023. FINDINGS: CT CHEST FINDINGS Cardiovascular: Atherosclerotic calcification of the aorta, aortic valve and coronary arteries. Heart size normal. No pericardial effusion. Mediastinum/Nodes: No pathologically enlarged mediastinal, hilar or axillary lymph nodes. Esophagus is grossly unremarkable. Lungs/Pleura: Numerous thin walled parenchymal cysts. Scattered pulmonary nodules measure up to 5 mm in the anterolateral left lower lobe (11/102), unchanged from 09/13/2023. No pleural fluid. Airway is unremarkable. Musculoskeletal: Degenerative changes in the spine. Tiny sclerotic lesion in the T11 vertebral body, unchanged and possibly a bone island. No worrisome lytic or sclerotic lesions. CT ABDOMEN PELVIS FINDINGS Hepatobiliary: Increase in intrahepatic biliary ductal dilatation with surrounding hyperattenuation in the liver on arterial phase imaging, indicative of hyperemia. Probable small cysts  in segment IV of the liver, as before. Gallbladder is grossly unremarkable. Common bile duct is slightly increased in caliber as well, 12 mm, previously 9 mm. Pancreas: Simple appearing 1.4 cm low-attenuation cyst in distal body of the pancreas (2/43), similar to 09/13/2023 but increased from 8 mm on 03/08/2018. No pancreatic ductal dilatation. Spleen: Negative. Adrenals/Urinary Tract: Right adrenalectomy. 7 mm lesion in the left adrenal gland measures 16 Hounsfield units. No specific follow-up necessary. Right nephrectomy. Along soft tissue nodule along the superolateral aspect of the right psoas muscle measures 9 x 20 mm (2/49), similar to MR abdomen 10/16/2023. Nodule seen posterior to the inferior right hepatic lobe on 10/16/2023 is not readily appreciated. Subcentimeter low-attenuation lesions in the left kidney, too small to characterize. No specific follow-up necessary. Left ureter is decompressed. Bladder is relatively low in volume. Stomach/Bowel: Stomach, small bowel, appendix and colon are unremarkable. Vascular/Lymphatic: Atherosclerotic calcification of the aorta. No pathologically enlarged lymph nodes. Circumaortic left renal vein. Reproductive: Prostate is enlarged and indents the bladder. Other: Small bilateral inguinal hernias contain fat.  No free fluid. Musculoskeletal: Degenerative changes in the spine. Old L5 compression fracture. No worrisome lytic or sclerotic lesions. IMPRESSION: 1. Oblong soft tissue nodule along the superolateral aspect of the right psoas muscle, as on MR abdomen 10/16/2023. Residual as recurrent disease cannot be excluded. Additional nodule is seen along the posterior right hepatic lobe on MR abdomen 10/16/2023 is not readily appreciated. 2. Scattered tiny pulmonary nodules are stable and worrisome for metastatic disease. 3. Increased intrahepatic and extrahepatic biliary ductal dilatation with apparent hyperemia in  the liver, findings which may be due to an occult  obstructing lower common bile duct stone. Ascending cholangitis cannot be excluded. Recommend MR abdomen without and with contrast with MRCP in further evaluation, as clinically indicated. These results will be called to the ordering clinician or representative by the Radiologist Assistant, and communication documented in the PACS or [employer]. 4. Simple appearing cyst in the pancreas, better evaluated on MR abdomen 10/16/2023. 5. Aortic atherosclerosis (ICD10-I70.0). Coronary artery calcification. Electronically Signed   By: Leanna Battles M.D.   On: 01/30/2024 15:45   MR Brain W Wo Contrast Result Date: 01/07/2024 CLINICAL DATA:  Brain metastases, assess treatment response. History of renal cell carcinoma. EXAM: MRI HEAD WITHOUT AND WITH CONTRAST TECHNIQUE: Multiplanar, multiecho pulse sequences of the brain and surrounding structures were obtained without and with intravenous contrast. CONTRAST:  10mL GADAVIST GADOBUTROL 1 MMOL/ML IV SOLN COMPARISON:  Head MRI 10/16/2023 FINDINGS: Brain: Numerous enhancing brain lesions on the prior MRI have decreased in size, with some no longer being visible. Old blood products are now apparent associated with multiple lesions. A lesion in the right periatrial white matter has minimally increased in size measuring 6 mm (series 16, image 85) and demonstrates intrinsically T1 hyperintense/subacute blood products. There is no significant associated edema. Scattered small cystic lesions with faint or no peripheral enhancement are present in both cerebral hemispheres. Some of these correspond to previously enhancing lesions, while others do not clearly correspond to a previous lesion on the prior examination, and examples in the frontal lobes are denoted with double arrows on series 16. There is also a 1.6 cm cystic lesion without any appreciable enhancement in the lateral aspect of the left cerebellar hemisphere which has enlarged (series 16, image 29, previously 1.0  cm). Numerous dilated perivascular spaces are also again seen in both cerebral hemispheres which confound characterization of some of the other cystic foci. No acute infarct, midline shift, or extra-axial fluid collection is evident. The ventricles are normal in size. A small cavum septum pellucidum et vergae is noted. There is a background of mild chronic small vessel ischemia in the cerebral white matter with asymmetric chronic small-vessel changes in the left basal ganglia. Vascular: Major intracranial vascular flow voids are preserved. Skull and upper cervical spine: No suspicious marrow lesion. Sinuses/Orbits: Unremarkable orbits. Minimal mucosal thickening in the left maxillary sinus. No significant mastoid fluid. Other: None. IMPRESSION: 1. Decreased size of numerous enhancing brain metastases. Minimally increased size of a right periatrial lesion with associated subacute blood products. 2. Scattered small cystic lesions in both cerebral hemispheres with faint or no peripheral enhancement, some of which correspond to previously enhancing lesions while others do not. Mild enlargement of a cystic lesion in the left cerebellar hemisphere. A short interval follow-up MRI is recommended to assess for active versus treated metastases. Electronically Signed   By: Sebastian Ache M.D.   On: 01/07/2024 18:39

## 2024-01-30 NOTE — Assessment & Plan Note (Signed)
 MRCP

## 2024-01-30 NOTE — Assessment & Plan Note (Signed)
 Report SBP 110-130's at home with current both medication. Monitor BP in relation to cancer therapy of lenvatinib. Continue amlodipine at 10 mg daily Lisinopril-hydrochlorothiazide 25 mg daily. If morning SBP <120-140, only take the amlodipine.

## 2024-01-30 NOTE — Assessment & Plan Note (Signed)
 Continue lenvatinib with pembrolizumab  lenvatinib 20 mg daily Monitor for toxicity CT CAP to be scheduled in 3 months MRI of brain in April SRS if needed after systemic treatment

## 2024-01-30 NOTE — Assessment & Plan Note (Signed)
 MRI in April Continue systemic treatment

## 2024-01-31 ENCOUNTER — Inpatient Hospital Stay: Payer: Medicare PPO

## 2024-01-31 ENCOUNTER — Other Ambulatory Visit: Payer: Self-pay

## 2024-01-31 ENCOUNTER — Inpatient Hospital Stay (HOSPITAL_BASED_OUTPATIENT_CLINIC_OR_DEPARTMENT_OTHER): Payer: Medicare PPO

## 2024-01-31 VITALS — BP 139/85 | HR 71 | Temp 97.7°F | Resp 14 | Ht 75.0 in | Wt 209.9 lb

## 2024-01-31 DIAGNOSIS — K862 Cyst of pancreas: Secondary | ICD-10-CM

## 2024-01-31 DIAGNOSIS — I2510 Atherosclerotic heart disease of native coronary artery without angina pectoris: Secondary | ICD-10-CM | POA: Insufficient documentation

## 2024-01-31 DIAGNOSIS — R918 Other nonspecific abnormal finding of lung field: Secondary | ICD-10-CM | POA: Diagnosis not present

## 2024-01-31 DIAGNOSIS — E1122 Type 2 diabetes mellitus with diabetic chronic kidney disease: Secondary | ICD-10-CM | POA: Insufficient documentation

## 2024-01-31 DIAGNOSIS — N400 Enlarged prostate without lower urinary tract symptoms: Secondary | ICD-10-CM | POA: Insufficient documentation

## 2024-01-31 DIAGNOSIS — E785 Hyperlipidemia, unspecified: Secondary | ICD-10-CM | POA: Diagnosis not present

## 2024-01-31 DIAGNOSIS — C7931 Secondary malignant neoplasm of brain: Secondary | ICD-10-CM | POA: Insufficient documentation

## 2024-01-31 DIAGNOSIS — C649 Malignant neoplasm of unspecified kidney, except renal pelvis: Secondary | ICD-10-CM

## 2024-01-31 DIAGNOSIS — Z905 Acquired absence of kidney: Secondary | ICD-10-CM | POA: Insufficient documentation

## 2024-01-31 DIAGNOSIS — I129 Hypertensive chronic kidney disease with stage 1 through stage 4 chronic kidney disease, or unspecified chronic kidney disease: Secondary | ICD-10-CM | POA: Insufficient documentation

## 2024-01-31 DIAGNOSIS — Z5112 Encounter for antineoplastic immunotherapy: Secondary | ICD-10-CM | POA: Diagnosis not present

## 2024-01-31 DIAGNOSIS — K219 Gastro-esophageal reflux disease without esophagitis: Secondary | ICD-10-CM | POA: Diagnosis not present

## 2024-01-31 DIAGNOSIS — G4733 Obstructive sleep apnea (adult) (pediatric): Secondary | ICD-10-CM | POA: Insufficient documentation

## 2024-01-31 DIAGNOSIS — R5383 Other fatigue: Secondary | ICD-10-CM | POA: Diagnosis not present

## 2024-01-31 DIAGNOSIS — N1831 Chronic kidney disease, stage 3a: Secondary | ICD-10-CM | POA: Insufficient documentation

## 2024-01-31 DIAGNOSIS — C641 Malignant neoplasm of right kidney, except renal pelvis: Secondary | ICD-10-CM | POA: Insufficient documentation

## 2024-01-31 DIAGNOSIS — C61 Malignant neoplasm of prostate: Secondary | ICD-10-CM | POA: Diagnosis not present

## 2024-01-31 DIAGNOSIS — K838 Other specified diseases of biliary tract: Secondary | ICD-10-CM

## 2024-01-31 DIAGNOSIS — K5900 Constipation, unspecified: Secondary | ICD-10-CM | POA: Insufficient documentation

## 2024-01-31 DIAGNOSIS — I700 Atherosclerosis of aorta: Secondary | ICD-10-CM | POA: Diagnosis not present

## 2024-01-31 DIAGNOSIS — I159 Secondary hypertension, unspecified: Secondary | ICD-10-CM

## 2024-01-31 DIAGNOSIS — E119 Type 2 diabetes mellitus without complications: Secondary | ICD-10-CM | POA: Insufficient documentation

## 2024-01-31 DIAGNOSIS — Z9189 Other specified personal risk factors, not elsewhere classified: Secondary | ICD-10-CM

## 2024-01-31 DIAGNOSIS — K5909 Other constipation: Secondary | ICD-10-CM

## 2024-01-31 LAB — CBC WITH DIFFERENTIAL (CANCER CENTER ONLY)
Abs Immature Granulocytes: 0 10*3/uL (ref 0.00–0.07)
Basophils Absolute: 0 10*3/uL (ref 0.0–0.1)
Basophils Relative: 0 %
Eosinophils Absolute: 0.1 10*3/uL (ref 0.0–0.5)
Eosinophils Relative: 2 %
HCT: 40.6 % (ref 39.0–52.0)
Hemoglobin: 12.9 g/dL — ABNORMAL LOW (ref 13.0–17.0)
Immature Granulocytes: 0 %
Lymphocytes Relative: 23 %
Lymphs Abs: 1.2 10*3/uL (ref 0.7–4.0)
MCH: 26.9 pg (ref 26.0–34.0)
MCHC: 31.8 g/dL (ref 30.0–36.0)
MCV: 84.6 fL (ref 80.0–100.0)
Monocytes Absolute: 0.3 10*3/uL (ref 0.1–1.0)
Monocytes Relative: 6 %
Neutro Abs: 3.5 10*3/uL (ref 1.7–7.7)
Neutrophils Relative %: 69 %
Platelet Count: 203 10*3/uL (ref 150–400)
RBC: 4.8 MIL/uL (ref 4.22–5.81)
RDW: 15.9 % — ABNORMAL HIGH (ref 11.5–15.5)
WBC Count: 5.1 10*3/uL (ref 4.0–10.5)
nRBC: 0 % (ref 0.0–0.2)

## 2024-01-31 LAB — CMP (CANCER CENTER ONLY)
ALT: 20 U/L (ref 0–44)
AST: 15 U/L (ref 15–41)
Albumin: 3.9 g/dL (ref 3.5–5.0)
Alkaline Phosphatase: 117 U/L (ref 38–126)
Anion gap: 5 (ref 5–15)
BUN: 19 mg/dL (ref 8–23)
CO2: 31 mmol/L (ref 22–32)
Calcium: 9.3 mg/dL (ref 8.9–10.3)
Chloride: 98 mmol/L (ref 98–111)
Creatinine: 1.47 mg/dL — ABNORMAL HIGH (ref 0.61–1.24)
GFR, Estimated: 49 mL/min — ABNORMAL LOW (ref >60)
Glucose, Bld: 157 mg/dL — ABNORMAL HIGH (ref 70–99)
Potassium: 3.8 mmol/L (ref 3.5–5.1)
Sodium: 134 mmol/L — ABNORMAL LOW (ref 135–145)
Total Bilirubin: 0.4 mg/dL (ref 0.0–1.2)
Total Protein: 7.8 g/dL (ref 6.5–8.1)

## 2024-01-31 LAB — LACTATE DEHYDROGENASE: LDH: 124 U/L (ref 98–192)

## 2024-01-31 MED ORDER — OMEPRAZOLE 20 MG PO CPDR: 20.0000 mg | DELAYED_RELEASE_CAPSULE | Freq: Every day | ORAL | 0 refills | Status: DC

## 2024-01-31 MED ORDER — SODIUM CHLORIDE 0.9% FLUSH
10.0000 mL | INTRAVENOUS | Status: DC | PRN
Start: 2024-01-31 — End: 2024-01-31

## 2024-01-31 MED ORDER — SODIUM CHLORIDE 0.9 % IV SOLN
INTRAVENOUS | Status: DC
Start: 2024-01-31 — End: 2024-01-31

## 2024-01-31 MED ORDER — HEPARIN SOD (PORK) LOCK FLUSH 100 UNIT/ML IV SOLN
500.0000 [IU] | Freq: Once | INTRAVENOUS | Status: DC | PRN
Start: 2024-01-31 — End: 2024-01-31

## 2024-01-31 MED ORDER — SODIUM CHLORIDE 0.9 % IV SOLN
200.0000 mg | Freq: Once | INTRAVENOUS | Status: AC
  Administered 2024-01-31: 200 mg via INTRAVENOUS
  Filled 2024-01-31: qty 200

## 2024-01-31 NOTE — Assessment & Plan Note (Signed)
Miralax as needed

## 2024-01-31 NOTE — Patient Instructions (Signed)

## 2024-01-31 NOTE — Assessment & Plan Note (Addendum)
 Symptoms of GERD and acid reflux Start omeprazole 20 mg daily in the morning.  Prescription sent today.

## 2024-01-31 NOTE — Assessment & Plan Note (Signed)
 Intense drug monitoring for lenvatinib. Fluid goal 64 oz per day Monitor for hypertension with baseline hypertension Baseline EKG normal repeat as indicated Check lab with LFT, renal function, electrolytes, every 2 weeks for 2 months and then monthly thereafter Check TSH and free T4 monthly urine dipstick; if 2+ then obtain a 24-hour urine protein Dental exam periodically Monitor for signs of bleeding, wound healing, thrombosis Follow up 2 weeks on lenvatinib.

## 2024-02-10 ENCOUNTER — Other Ambulatory Visit: Payer: Self-pay

## 2024-02-11 ENCOUNTER — Other Ambulatory Visit (HOSPITAL_COMMUNITY): Payer: Self-pay

## 2024-02-11 NOTE — Progress Notes (Signed)
 Specialty Pharmacy Refill Coordination Note  [name] is a 77 y.o. male contacted today regarding refills of specialty medication(s) Lenvatinib Mesylate [facility])   Patient requested Delivery   Delivery date: 02/26/24   Verified address: [address]  [place]   Medication will be filled on 02/25/24.

## 2024-02-11 NOTE — Progress Notes (Signed)
 Specialty Pharmacy Ongoing Clinical Assessment Note  [name] is a 78 y.o. male who is being followed by the specialty pharmacy service for RxSp Oncology   Patient's specialty medication(s) reviewed today: Lenvatinib Mesylate (LENVIMA)   Missed doses in the last 4 weeks: 0   Patient/Caregiver did not have any additional questions or concerns.   Therapeutic benefit summary: Patient is achieving benefit   Adverse events/side effects summary: Experienced adverse events/side effects (mild itchy rash, will discuss with MD at next appt and try OTC hydrocortisone cream)   Patient's therapy is appropriate to: Continue    Goals Addressed             This Visit's Progress    Maintain optimal adherence to therapy   No change    Patient is initiating therapy. Patient will maintain adherence.  MRI is scheduled for April.          Follow up:  3 months  Servando Snare Specialty Pharmacist

## 2024-02-15 ENCOUNTER — Encounter (HOSPITAL_COMMUNITY): Payer: Self-pay

## 2024-02-15 ENCOUNTER — Other Ambulatory Visit: Payer: Self-pay

## 2024-02-15 ENCOUNTER — Ambulatory Visit (HOSPITAL_COMMUNITY)
Admission: RE | Admit: 2024-02-15 | Discharge: 2024-02-15 | Disposition: A | Discharge status: Home / Self Care | Source: Ambulatory Visit

## 2024-02-15 DIAGNOSIS — Z905 Acquired absence of kidney: Secondary | ICD-10-CM | POA: Diagnosis not present

## 2024-02-15 DIAGNOSIS — K862 Cyst of pancreas: Secondary | ICD-10-CM | POA: Insufficient documentation

## 2024-02-15 DIAGNOSIS — K838 Other specified diseases of biliary tract: Secondary | ICD-10-CM | POA: Diagnosis not present

## 2024-02-15 DIAGNOSIS — E1169 Type 2 diabetes mellitus with other specified complication: Secondary | ICD-10-CM | POA: Diagnosis not present

## 2024-02-15 DIAGNOSIS — Z91030 Bee allergy status: Secondary | ICD-10-CM | POA: Diagnosis not present

## 2024-02-15 DIAGNOSIS — C641 Malignant neoplasm of right kidney, except renal pelvis: Secondary | ICD-10-CM | POA: Diagnosis not present

## 2024-02-15 DIAGNOSIS — R1900 Intra-abdominal and pelvic swelling, mass and lump, unspecified site: Secondary | ICD-10-CM | POA: Diagnosis not present

## 2024-02-15 DIAGNOSIS — Z0000 Encounter for general adult medical examination without abnormal findings: Secondary | ICD-10-CM | POA: Diagnosis not present

## 2024-02-15 DIAGNOSIS — I10 Essential (primary) hypertension: Secondary | ICD-10-CM | POA: Diagnosis not present

## 2024-02-15 DIAGNOSIS — E782 Mixed hyperlipidemia: Secondary | ICD-10-CM | POA: Diagnosis not present

## 2024-02-15 DIAGNOSIS — Z1331 Encounter for screening for depression: Secondary | ICD-10-CM | POA: Diagnosis not present

## 2024-02-15 MED ORDER — GADOBUTROL 1 MMOL/ML IV SOLN
9.0000 mL | Freq: Once | INTRAVENOUS | Status: AC | PRN
  Administered 2024-02-15: 9 mL via INTRAVENOUS

## 2024-02-20 NOTE — Assessment & Plan Note (Addendum)
 Intra and extrahepatic biliary ductal dilatation  Spoke to Rad. No mass or compression. Symptoms resolved. Referral for ERCP if recurrent

## 2024-02-20 NOTE — Progress Notes (Unsigned)
 [facility] OFFICE PROGRESS NOTE  Patient Care Team: [name], MD as PCP - General (Family Medicine)  [name] is a 78 y.o. 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 Currently with stage IV RCC. Staging imaging showed brain metastases, and pulmonary nodules suggestive of lung metastases.   Current diagnosis: stage IV RCC with brain metastases Initial diagnosis: 10/2022. pT3cN0M0 G3 right papillary RCC, type 2 Previous Treatment: 10/27/22 Open radical nephrectomy with IVC repair, cardiac bypass for level 3 thrombus at [facility] Renal function: 10/09/23 Cr. 1.53. Labs: Ca 10.2. WBC 6.7. HGB 13.4 PLT 221. ANC 4.4  10/2023 first-line treatment cycle 1 day 1 lenvatinib with pembrolizumab  Tolerating well.  Some fatigue from pembrolizumab.  It is lasting about 3 days.  Recommend increase fluid intake.  Abdominal pain resolved.  Dilated intra and extrahepatic ducts.  No pathology clearly on MRI.  Bili was normal.  Reports paleness in the stool.  If persistent, will refer for ERCP. Assessment & Plan Renal cell carcinoma, right (HCC) Continue lenvatinib with pembrolizumab  lenvatinib 20 mg daily Monitor for toxicity CT CAP to be scheduled in about 3 months MRI of brain in late April. Ordered today SRS if needed after systemic treatment Dilated intrahepatic bile duct Intra and extrahepatic biliary ductal dilatation  Spoke to Rad. No mass or compression. Symptoms resolved. Referral for ERCP if recurrent Secondary hypertension Report SBP 110-130's at home with current both medication. Monitor BP in relation to cancer therapy of lenvatinib. Continue amlodipine at 10 mg daily Lisinopril-hydrochlorothiazide 25 mg daily. If morning SBP <120-140, only take the amlodipine. 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  Mouth pain Dexamethasone mouthwash. Metastatic cancer to brain [facility]) MRI of brain ordered Other fatigue Report not drinking enough fluid.  Recommend increase fluid intake.  Orders Placed This Encounter  Procedures   MR Brain W Wo Contrast    Standing Status:   Future    Expected Date:   03/23/2024    Expiration Date:   02/20/2025    If indicated for the ordered procedure, I authorize the administration of contrast media per Radiology protocol:   Yes    What is the patient's sedation requirement?:   No Sedation    Does the patient have a pacemaker or implanted devices?:   No    Use SRS Protocol?:   Yes    Preferred imaging location?:   [facility] (table limit - 550 lbs)     [name], MD  INTERVAL HISTORY: Patient returns for follow-up. No new headache or persistent and constant a/w nausea and vomiting. No focal weakness.   Treatment with infusion make him tired for about 3 days. No diarrhea, mouth sores. There mouth tenderness. Stool color is lighter.   Oncology History  Malignant neoplasm of prostate (HCC)  04/01/2018 Initial Diagnosis   Malignant neoplasm of prostate (HCC)   04/30/2018 Genetic Testing   Multi-Cancer panel (83 genes) @ [facility] - No pathogenic mutations detected Variants of Uncertain Significance in ATM, SDHA and TSC2  Genes Analyzed: 83 genes on [facility]'s Multi-Cancer panel (ALK, APC, ATM, AXIN2, BAP1, BARD1, BLM, BMPR1A, BRCA1, BRCA2, BRIP1, CASR, CDC73, CDH1, CDK4,  CDKN1B, CDKN1C, CDKN2A, CEBPA, CHEK2, CTNNA1, DICER1, DIS3L2, EGFR, EPCAM, FH, FLCN, GATA2, GPC3, GREM1, HOXB13, HRAS, KIT, MAX, MEN1, MET, MITF, MLH1, MSH2, MSH3, MSH6, MUTYH, NBN, NF1, NF2, NTHL1, PALB2, PDGFRA, PHOX2B, PMS2, POLD1, POLE, POT1, PRKAR1A, PTCH1, PTEN, RAD50, RAD51C, RAD51D, RB1, RECQL4, RET, RUNX1, SDHA,  SDHAF2, SDHB, SDHC, SDHD, SMAD4, SMARCA4, SMARCB1, SMARCE1, STK11, SUFU, TERC, TERT, TMEM127, TP53, TSC1, TSC2, VHL, WRN, WT1).  UPDATE: ATM c.7988T>C VUS was amended to "Likely Benign" due to a re-review of the evidence in light of new variant interpretation guidelines and/or new information.   Renal cell carcinoma, right (HCC)  09/29/2022 Imaging   CT chest 1.  No evidence of metastatic disease in the thorax.  2.  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 Consider correlation with genetic testing.  3.  Reference same day abdominal MRI for details regarding the right renal mass and associated tumor thrombus in the right renal vein and IVC    09/29/2022 Imaging   MRI 1.  Right interpolar renal 3.5 cm mass concerning for primary renal malignancy. Upper pole and lower pole tumor is favored to primarily be intravenous tumor thrombus, with intravenous tumor expanding the right renal vein, and the subdiaphragmatic IVC as detailed above. Differential includes renal cell carcinoma (favored) and transitional cell carcinoma.  2.  Please see outside 08/25/2022 CT abdomen and pelvis for evaluation of the collecting system on delayed/urographic phase.  3.  No definite lymphadenopathy.  4.  Signal changes in the L5 superior endplate are favored to reflect a fracture. However, recommend continued attention on follow-up.    10/27/2022 Pathology Results   SPECIMEN    Procedure:    Radical nephrectomy     Specimen Laterality:    Right   TUMOR    Tumor Focality:    Unifocal     Tumor Size:    Greatest Dimension (Centimeters): 8.2 cm     Histologic Type:    Papillary renal cell carcinoma, type 2     Histologic Grade (WHO / ISUP):    G3 (nucleoli conspicuous and eosinophilic at 100x magnification)     Tumor Extent:    Extends into perinephric tissue (beyond renal capsule)     Tumor Extent:    Extends into renal sinus     Tumor  Extent:    Extends into major vein (renal vein or its segmental branches, inferior vena cava)     Sarcomatoid Features:    Not identified     Rhabdoid Features:    Not identified     Tumor Necrosis:    Not identified     Lymphovascular Invasion:    Present   MARGINS    Margin Status:    Invasive carcinoma present at margin       Margin(s) Involved by Invasive Carcinoma:    Renal vein: present in renal vein nephrectomy margin; No tumor seen is separately submitted renal vein segment.   REGIONAL LYMPH NODES     Regional Lymph Node Status:    Not applicable (no regional lymph nodes submitted or found)   PATHOLOGIC STAGE CLASSIFICATION (pTNM, AJCC 8th Edition)     Reporting of pT, pN, and (when applicable) pM categories is based on information available to the pathologist at the time the report is issued. As per the AJCC (Chapter 1, 8th Ed.) it is the managing physician's responsibility to establish the final  pathologic stage based upon all pertinent information, including but potentially  not limited to this pathology report.     Primary Tumor (pT):    pT3b     Regional Lymph Nodes (pN):    pN not assigned (no nodes submitted or found)    03/08/2023 Imaging   CT CAP Status post radical right nephrectomy, IVC repair, and partial right adrenalectomy without evidence of recurrent or metastatic disease.  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  L5 compression deformity without progressive interval height loss  Lungs/Pleura: No large airspace consolidation or pleural effusion. Similar distribution of numerous thin-walled pulmonary cysts throughout the bilateral lungs. Bibasilar atelectasis/scarring. No new pulmonary nodules.    09/13/2023 Imaging   CT CAP 5 mm left lower lobe pulmonary nodule Solid upper lobe pulmonary nodule 3 mm 4 mm right lower lobe nodule Sclerotic focus T1 change from prior likely bone island.  No  aggressive lytic lesion.  Multilevel degenerative changes of spine and bilateral shoulder Similar appearance of sclerotic superior endplate deformity at L5 unchanged dating back to September 2023.  New from April 2019. 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 No pathologically enlarged abdominal or pelvic lymph nodes.   10/08/2023 Initial Diagnosis   Renal cell carcinoma, right (HCC)   11/08/2023 -  Chemotherapy   Patient is on Treatment Plan : BLADDER Pembrolizumab (200) q21d        PHYSICAL EXAMINATION: ECOG PERFORMANCE STATUS: 1 - Symptomatic but completely ambulatory  Vitals:   02/21/24 0849  BP: 120/63  Pulse: 70  Resp: 16  Temp: 97.6 F (36.4 C)   Filed Weights   02/21/24 0849  Weight: 208 lb 8 oz (94.6 kg)   Clear to auscultation Nontender, nondistended abdomen No edema  Relevant data reviewed during this visit included lab and imaging.

## 2024-02-20 NOTE — Assessment & Plan Note (Addendum)
 Report SBP 110-130's at home with current both medication. Monitor BP in relation to cancer therapy of lenvatinib. Continue amlodipine at 10 mg daily Lisinopril-hydrochlorothiazide 25 mg daily. If morning SBP <120-140, only take the amlodipine.

## 2024-02-20 NOTE — Assessment & Plan Note (Addendum)
 Continue lenvatinib with pembrolizumab  lenvatinib 20 mg daily Monitor for toxicity CT CAP to be scheduled in about 3 months MRI of brain in late April. Ordered today SRS if needed after systemic treatment

## 2024-02-20 NOTE — Assessment & Plan Note (Signed)
 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

## 2024-02-21 ENCOUNTER — Inpatient Hospital Stay (HOSPITAL_BASED_OUTPATIENT_CLINIC_OR_DEPARTMENT_OTHER): Payer: Medicare PPO

## 2024-02-21 ENCOUNTER — Inpatient Hospital Stay: Payer: Medicare PPO

## 2024-02-21 ENCOUNTER — Ambulatory Visit: Payer: Medicare PPO | Admitting: Audiology

## 2024-02-21 VITALS — BP 120/63 | HR 70 | Temp 97.6°F | Resp 16 | Ht 75.0 in | Wt 208.5 lb

## 2024-02-21 DIAGNOSIS — K219 Gastro-esophageal reflux disease without esophagitis: Secondary | ICD-10-CM

## 2024-02-21 DIAGNOSIS — C641 Malignant neoplasm of right kidney, except renal pelvis: Secondary | ICD-10-CM | POA: Diagnosis not present

## 2024-02-21 DIAGNOSIS — N400 Enlarged prostate without lower urinary tract symptoms: Secondary | ICD-10-CM | POA: Diagnosis not present

## 2024-02-21 DIAGNOSIS — N1831 Chronic kidney disease, stage 3a: Secondary | ICD-10-CM | POA: Diagnosis not present

## 2024-02-21 DIAGNOSIS — C7931 Secondary malignant neoplasm of brain: Secondary | ICD-10-CM

## 2024-02-21 DIAGNOSIS — Z5112 Encounter for antineoplastic immunotherapy: Secondary | ICD-10-CM | POA: Diagnosis not present

## 2024-02-21 DIAGNOSIS — R5383 Other fatigue: Secondary | ICD-10-CM

## 2024-02-21 DIAGNOSIS — K838 Other specified diseases of biliary tract: Secondary | ICD-10-CM | POA: Diagnosis not present

## 2024-02-21 DIAGNOSIS — G4733 Obstructive sleep apnea (adult) (pediatric): Secondary | ICD-10-CM | POA: Diagnosis not present

## 2024-02-21 DIAGNOSIS — Z905 Acquired absence of kidney: Secondary | ICD-10-CM | POA: Diagnosis not present

## 2024-02-21 DIAGNOSIS — I129 Hypertensive chronic kidney disease with stage 1 through stage 4 chronic kidney disease, or unspecified chronic kidney disease: Secondary | ICD-10-CM | POA: Diagnosis not present

## 2024-02-21 DIAGNOSIS — K5900 Constipation, unspecified: Secondary | ICD-10-CM | POA: Diagnosis not present

## 2024-02-21 DIAGNOSIS — E119 Type 2 diabetes mellitus without complications: Secondary | ICD-10-CM | POA: Diagnosis not present

## 2024-02-21 DIAGNOSIS — R531 Weakness: Secondary | ICD-10-CM | POA: Insufficient documentation

## 2024-02-21 DIAGNOSIS — I159 Secondary hypertension, unspecified: Secondary | ICD-10-CM | POA: Diagnosis not present

## 2024-02-21 DIAGNOSIS — I700 Atherosclerosis of aorta: Secondary | ICD-10-CM | POA: Diagnosis not present

## 2024-02-21 DIAGNOSIS — E785 Hyperlipidemia, unspecified: Secondary | ICD-10-CM | POA: Diagnosis not present

## 2024-02-21 DIAGNOSIS — E1122 Type 2 diabetes mellitus with diabetic chronic kidney disease: Secondary | ICD-10-CM | POA: Diagnosis not present

## 2024-02-21 DIAGNOSIS — K1379 Other lesions of oral mucosa: Secondary | ICD-10-CM | POA: Insufficient documentation

## 2024-02-21 DIAGNOSIS — C61 Malignant neoplasm of prostate: Secondary | ICD-10-CM | POA: Diagnosis not present

## 2024-02-21 DIAGNOSIS — R918 Other nonspecific abnormal finding of lung field: Secondary | ICD-10-CM | POA: Diagnosis not present

## 2024-02-21 DIAGNOSIS — I2510 Atherosclerotic heart disease of native coronary artery without angina pectoris: Secondary | ICD-10-CM | POA: Diagnosis not present

## 2024-02-21 LAB — TSH: TSH: 2.553 u[IU]/mL (ref 0.350–4.500)

## 2024-02-21 LAB — T4, FREE: Free T4: 0.69 ng/dL (ref 0.61–1.12)

## 2024-02-21 LAB — CMP (CANCER CENTER ONLY)
ALT: 14 U/L (ref 0–44)
AST: 13 U/L — ABNORMAL LOW (ref 15–41)
Albumin: 4.1 g/dL (ref 3.5–5.0)
Alkaline Phosphatase: 81 U/L (ref 38–126)
Anion gap: 4 — ABNORMAL LOW (ref 5–15)
BUN: 20 mg/dL (ref 8–23)
CO2: 29 mmol/L (ref 22–32)
Calcium: 9.5 mg/dL (ref 8.9–10.3)
Chloride: 103 mmol/L (ref 98–111)
Creatinine: 1.46 mg/dL — ABNORMAL HIGH (ref 0.61–1.24)
GFR, Estimated: 50 mL/min — ABNORMAL LOW (ref >60)
Glucose, Bld: 141 mg/dL — ABNORMAL HIGH (ref 70–99)
Potassium: 3.8 mmol/L (ref 3.5–5.1)
Sodium: 136 mmol/L (ref 135–145)
Total Bilirubin: 0.4 mg/dL (ref 0.0–1.2)
Total Protein: 7.8 g/dL (ref 6.5–8.1)

## 2024-02-21 LAB — CBC WITH DIFFERENTIAL (CANCER CENTER ONLY)
Abs Immature Granulocytes: 0.01 10*3/uL (ref 0.00–0.07)
Basophils Absolute: 0 10*3/uL (ref 0.0–0.1)
Basophils Relative: 0 %
Eosinophils Absolute: 0.1 10*3/uL (ref 0.0–0.5)
Eosinophils Relative: 3 %
HCT: 39.6 % (ref 39.0–52.0)
Hemoglobin: 12.5 g/dL — ABNORMAL LOW (ref 13.0–17.0)
Immature Granulocytes: 0 %
Lymphocytes Relative: 21 %
Lymphs Abs: 1 10*3/uL (ref 0.7–4.0)
MCH: 26.9 pg (ref 26.0–34.0)
MCHC: 31.6 g/dL (ref 30.0–36.0)
MCV: 85.2 fL (ref 80.0–100.0)
Monocytes Absolute: 0.4 10*3/uL (ref 0.1–1.0)
Monocytes Relative: 7 %
Neutro Abs: 3.3 10*3/uL (ref 1.7–7.7)
Neutrophils Relative %: 69 %
Platelet Count: 190 10*3/uL (ref 150–400)
RBC: 4.65 MIL/uL (ref 4.22–5.81)
RDW: 15.9 % — ABNORMAL HIGH (ref 11.5–15.5)
WBC Count: 4.9 10*3/uL (ref 4.0–10.5)
nRBC: 0 % (ref 0.0–0.2)

## 2024-02-21 LAB — LACTATE DEHYDROGENASE: LDH: 111 U/L (ref 98–192)

## 2024-02-21 MED ORDER — SODIUM CHLORIDE 0.9 % IV SOLN
200.0000 mg | Freq: Once | INTRAVENOUS | Status: AC
  Administered 2024-02-21: 200 mg via INTRAVENOUS
  Filled 2024-02-21: qty 200

## 2024-02-21 MED ORDER — SODIUM CHLORIDE 0.9 % IV SOLN: INTRAVENOUS | Status: DC

## 2024-02-21 NOTE — Patient Instructions (Signed)

## 2024-02-21 NOTE — Assessment & Plan Note (Addendum)
 Dexamethasone mouthwash.

## 2024-02-21 NOTE — Assessment & Plan Note (Addendum)
 Symptoms of GERD and acid reflux Start omeprazole 20 mg daily in the morning.

## 2024-02-21 NOTE — Assessment & Plan Note (Addendum)
 Report not drinking enough fluid.  Recommend increase fluid intake.

## 2024-02-25 ENCOUNTER — Other Ambulatory Visit: Payer: Self-pay

## 2024-02-27 ENCOUNTER — Other Ambulatory Visit: Payer: Self-pay

## 2024-02-28 ENCOUNTER — Other Ambulatory Visit: Payer: Self-pay

## 2024-02-28 DIAGNOSIS — H401131 Primary open-angle glaucoma, bilateral, mild stage: Secondary | ICD-10-CM | POA: Diagnosis not present

## 2024-02-28 DIAGNOSIS — K219 Gastro-esophageal reflux disease without esophagitis: Secondary | ICD-10-CM

## 2024-02-28 DIAGNOSIS — E119 Type 2 diabetes mellitus without complications: Secondary | ICD-10-CM | POA: Diagnosis not present

## 2024-03-13 ENCOUNTER — Other Ambulatory Visit: Payer: Self-pay

## 2024-03-13 ENCOUNTER — Inpatient Hospital Stay: Payer: Medicare PPO

## 2024-03-13 VITALS — BP 108/71 | HR 64 | Temp 98.2°F | Resp 16 | Ht 74.0 in | Wt 207.8 lb

## 2024-03-13 DIAGNOSIS — C641 Malignant neoplasm of right kidney, except renal pelvis: Secondary | ICD-10-CM

## 2024-03-13 DIAGNOSIS — Z5112 Encounter for antineoplastic immunotherapy: Secondary | ICD-10-CM | POA: Insufficient documentation

## 2024-03-13 LAB — CMP (CANCER CENTER ONLY)
ALT: 15 U/L (ref 0–44)
AST: 16 U/L (ref 15–41)
Albumin: 4.3 g/dL (ref 3.5–5.0)
Alkaline Phosphatase: 97 U/L (ref 38–126)
Anion gap: 10 (ref 5–15)
BUN: 21 mg/dL (ref 8–23)
CO2: 26 mmol/L (ref 22–32)
Calcium: 9.5 mg/dL (ref 8.9–10.3)
Chloride: 101 mmol/L (ref 98–111)
Creatinine: 1.67 mg/dL — ABNORMAL HIGH (ref 0.61–1.24)
GFR, Estimated: 42 mL/min — ABNORMAL LOW (ref >60)
Glucose, Bld: 155 mg/dL — ABNORMAL HIGH (ref 70–99)
Potassium: 3.7 mmol/L (ref 3.5–5.1)
Sodium: 137 mmol/L (ref 135–145)
Total Bilirubin: 0.4 mg/dL (ref 0.0–1.2)
Total Protein: 8.1 g/dL (ref 6.5–8.1)

## 2024-03-13 LAB — CBC WITH DIFFERENTIAL (CANCER CENTER ONLY)
Abs Immature Granulocytes: 0.02 10*3/uL (ref 0.00–0.07)
Basophils Absolute: 0 10*3/uL (ref 0.0–0.1)
Basophils Relative: 0 %
Eosinophils Absolute: 0.2 10*3/uL (ref 0.0–0.5)
Eosinophils Relative: 3 %
HCT: 42.9 % (ref 39.0–52.0)
Hemoglobin: 13.9 g/dL (ref 13.0–17.0)
Immature Granulocytes: 0 %
Lymphocytes Relative: 22 %
Lymphs Abs: 1.3 10*3/uL (ref 0.7–4.0)
MCH: 27.1 pg (ref 26.0–34.0)
MCHC: 32.4 g/dL (ref 30.0–36.0)
MCV: 83.6 fL (ref 80.0–100.0)
Monocytes Absolute: 0.4 10*3/uL (ref 0.1–1.0)
Monocytes Relative: 6 %
Neutro Abs: 4.1 10*3/uL (ref 1.7–7.7)
Neutrophils Relative %: 69 %
Platelet Count: 223 10*3/uL (ref 150–400)
RBC: 5.13 MIL/uL (ref 4.22–5.81)
RDW: 15 % (ref 11.5–15.5)
WBC Count: 5.9 10*3/uL (ref 4.0–10.5)
nRBC: 0 % (ref 0.0–0.2)

## 2024-03-13 MED ORDER — SODIUM CHLORIDE 0.9 % IV SOLN: INTRAVENOUS | Status: DC

## 2024-03-13 MED ORDER — SODIUM CHLORIDE 0.9 % IV SOLN
200.0000 mg | Freq: Once | INTRAVENOUS | Status: AC
  Administered 2024-03-13: 200 mg via INTRAVENOUS
  Filled 2024-03-13: qty 200

## 2024-03-13 NOTE — Patient Instructions (Signed)

## 2024-03-18 ENCOUNTER — Other Ambulatory Visit: Payer: Self-pay

## 2024-03-18 ENCOUNTER — Other Ambulatory Visit (HOSPITAL_COMMUNITY): Payer: Self-pay

## 2024-03-19 ENCOUNTER — Other Ambulatory Visit: Payer: Self-pay

## 2024-03-19 NOTE — Progress Notes (Signed)
 Specialty Pharmacy Refill Coordination Note  [name] is a 78 y.o. male contacted today regarding refills of specialty medication(s) Lenvatinib  Mesylate (LENVIMA )   Patient requested (Patient-Rptd) Delivery   Delivery date: (Patient-Rptd) 04/01/24   Verified address: (Patient-Rptd) [address] [place] [address]   Medication will be filled on 0505/25.

## 2024-03-23 ENCOUNTER — Ambulatory Visit (HOSPITAL_COMMUNITY)
Admission: RE | Admit: 2024-03-23 | Discharge: 2024-03-23 | Disposition: A | Discharge status: Home / Self Care | Source: Ambulatory Visit

## 2024-03-23 DIAGNOSIS — C641 Malignant neoplasm of right kidney, except renal pelvis: Secondary | ICD-10-CM | POA: Diagnosis not present

## 2024-03-23 DIAGNOSIS — R9082 White matter disease, unspecified: Secondary | ICD-10-CM | POA: Diagnosis not present

## 2024-03-23 DIAGNOSIS — C7931 Secondary malignant neoplasm of brain: Secondary | ICD-10-CM | POA: Insufficient documentation

## 2024-03-23 MED ORDER — GADOBUTROL 1 MMOL/ML IV SOLN
9.0000 mL | Freq: Once | INTRAVENOUS | Status: AC | PRN
  Administered 2024-03-23: 9 mL via INTRAVENOUS

## 2024-04-01 NOTE — Progress Notes (Unsigned)
 [facility] OFFICE PROGRESS NOTE  Patient Care Team: Rae Bugler, MD as PCP - General (Family Medicine)  [name] is a 78 y.o. 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 Currently with stage IV RCC. Staging imaging showed brain metastases, and pulmonary nodules suggestive of lung metastases.   Current diagnosis: stage IV RCC with brain metastases Initial diagnosis: 10/2022. pT3cN0M0 G3 right papillary RCC, type 2 Previous Treatment: 10/27/22 Open radical nephrectomy with IVC repair, cardiac bypass for level 3 thrombus at [facility] Renal function: 10/09/23 Cr. 1.53. Labs: Ca 10.2. WBC 6.7. HGB 13.4 PLT 221. ANC 4.4   10/2023 first-line treatment cycle 1 day 1 lenvatinib  with pembrolizumab .   Activities has been reported in papillary subtype.   Loss of appetite likely from Lenvatinib . Will decrease dose. Some fatigue from treatment.  Recommend increase fluid intake.  Interval development of rash from immunotherapy. Will start triamcinolone twice daily and reassess next week.  Will need to follow up with MRI. Assessment & Plan Renal cell carcinoma, right (HCC) Continue lenvatinib  with pembrolizumab   Decrease lenvatinib  to 10 mg daily Monitor for toxicity CT CAP to be scheduled in about 3 months MRI of brain in late April. Ordered today SRS if needed after systemic treatment Secondary hypertension Report not taking BP at home Monitor BP in relation to cancer therapy of lenvatinib . hold amlodipine  at 10 mg daily Lisinopril -hydrochlorothiazide  25 mg daily. Rash Start Kenalog ointment twice daily for rash. Script sent. Once resolved, ok to use OTC hydrocortisone cream if developing new rash. If not improve call for evaluation. Decreased appetite Dec dose of lenvatinib  to 10 mg daily. Stage 3b chronic kidney disease  (HCC) Stable Solitary kidney with RCC s/p right nephrectomy  Fluid goal 60-70 oz daily   Garrett Ruddy, MD  INTERVAL HISTORY: Patient returns for follow-up. Report loss of appetite. Also has heartburn last night. Some  nausea or vomiting but improved. He has been taking lisinopril -hydrochlorothiazide  and amlodipine . He has not been taking BP at home. No diarrhea, dizziness, headaches, visual change, coughing, mouth sores but tender.  Rash over shoulder and trunk.  Oncology History  Malignant neoplasm of prostate (HCC)  04/01/2018 Initial Diagnosis   Malignant neoplasm of prostate (HCC)   04/30/2018 Genetic Testing   Multi-Cancer panel (83 genes) @ [facility] - No pathogenic mutations detected Variants of Uncertain Significance in ATM, SDHA and TSC2  Genes Analyzed: 83 genes on [facility]'s Multi-Cancer panel (ALK, APC, ATM, AXIN2, BAP1, BARD1, BLM, BMPR1A, BRCA1, BRCA2, BRIP1, CASR, CDC73, CDH1, CDK4, CDKN1B, CDKN1C, CDKN2A, CEBPA, CHEK2, CTNNA1, DICER1, DIS3L2, EGFR, EPCAM, FH, FLCN, GATA2, GPC3, GREM1, HOXB13, HRAS, KIT, MAX, MEN1, MET, MITF, MLH1, MSH2, MSH3, MSH6, MUTYH, NBN, NF1, NF2, NTHL1, PALB2, PDGFRA, PHOX2B, PMS2, POLD1, POLE, POT1, PRKAR1A, PTCH1, PTEN, RAD50, RAD51C, RAD51D, RB1, RECQL4, RET, RUNX1, SDHA, SDHAF2, SDHB, SDHC, SDHD, SMAD4, SMARCA4, SMARCB1, SMARCE1, STK11, SUFU, TERC, TERT, TMEM127, TP53, TSC1, TSC2, VHL, WRN, WT1).  UPDATE: ATM c.7988T>C VUS was amended to "Likely Benign" due to a re-review of the evidence in light of new variant interpretation guidelines and/or new information.   Renal cell carcinoma, right (HCC)  09/29/2022 Imaging   CT chest 1.  No evidence of metastatic disease in the thorax.  2.  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 Consider correlation  with genetic testing.  3.  Reference same day abdominal MRI for details regarding the right renal mass and associated tumor  thrombus in the right renal vein and IVC    09/29/2022 Imaging   MRI 1.  Right interpolar renal 3.5 cm mass concerning for primary renal malignancy. Upper pole and lower pole tumor is favored to primarily be intravenous tumor thrombus, with intravenous tumor expanding the right renal vein, and the subdiaphragmatic IVC as detailed above. Differential includes renal cell carcinoma (favored) and transitional cell carcinoma.  2.  Please see outside 08/25/2022 CT abdomen and pelvis for evaluation of the collecting system on delayed/urographic phase.  3.  No definite lymphadenopathy.  4.  Signal changes in the L5 superior endplate are favored to reflect a fracture. However, recommend continued attention on follow-up.    10/27/2022 Pathology Results   SPECIMEN    Procedure:    Radical nephrectomy     Specimen Laterality:    Right   TUMOR    Tumor Focality:    Unifocal     Tumor Size:    Greatest Dimension (Centimeters): 8.2 cm     Histologic Type:    Papillary renal cell carcinoma, type 2     Histologic Grade (WHO / ISUP):    G3 (nucleoli conspicuous and eosinophilic at 100x magnification)     Tumor Extent:    Extends into perinephric tissue (beyond renal capsule)     Tumor Extent:    Extends into renal sinus     Tumor Extent:    Extends into major vein (renal vein or its segmental branches, inferior vena cava)     Sarcomatoid Features:    Not identified     Rhabdoid Features:    Not identified     Tumor Necrosis:    Not identified     Lymphovascular Invasion:    Present   MARGINS    Margin Status:    Invasive carcinoma present at margin       Margin(s) Involved by Invasive Carcinoma:    Renal vein: present in renal vein nephrectomy margin; No tumor seen is separately submitted renal vein segment.   REGIONAL LYMPH NODES     Regional Lymph Node Status:    Not applicable (no regional lymph nodes submitted or found)   PATHOLOGIC STAGE CLASSIFICATION (pTNM, AJCC 8th Edition)     Reporting of pT,  pN, and (when applicable) pM categories is based on information available to the pathologist at the time the report is issued. As per the AJCC (Chapter 1, 8th Ed.) it is the managing physician's responsibility to establish the final  pathologic stage based upon all pertinent information, including but potentially not limited to this pathology report.     Primary Tumor (pT):    pT3b     Regional Lymph Nodes (pN):    pN not assigned (no nodes submitted or found)    03/08/2023 Imaging   CT CAP Status post radical right nephrectomy, IVC repair, and partial right adrenalectomy without evidence of recurrent or metastatic disease.  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  L5 compression deformity without progressive interval height loss  Lungs/Pleura: No large airspace consolidation or pleural effusion. Similar distribution of numerous thin-walled pulmonary cysts throughout the bilateral lungs. Bibasilar atelectasis/scarring. No new pulmonary nodules.    09/13/2023 Imaging   CT CAP 5 mm left lower lobe pulmonary nodule Solid upper lobe pulmonary nodule 3 mm 4 mm  right lower lobe nodule Sclerotic focus T1 change from prior likely bone island.  No aggressive lytic lesion.  Multilevel degenerative changes of spine and bilateral shoulder Similar appearance of sclerotic superior endplate deformity at L5 unchanged dating back to September 2023.  New from April 2019. 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 No pathologically enlarged abdominal or pelvic lymph nodes.   10/08/2023 Initial Diagnosis   Renal cell carcinoma, right (HCC)   11/08/2023 -  Chemotherapy   Patient is  on Treatment Plan : BLADDER Pembrolizumab  (200) q21d        PHYSICAL EXAMINATION: ECOG PERFORMANCE STATUS: 1 - Symptomatic but completely ambulatory  Vitals:   04/03/24 0852  BP: (!) 109/58  Pulse: 81  Resp: 17  Temp: (!) 97.3 F (36.3 C)  SpO2: 99%   Filed Weights   04/03/24 0852  Weight: 200 lb 9.6 oz (91 kg)    GENERAL: alert, no distress and comfortable SKIN: eczematous rash over abdominal wall EYES: normal, sclera clear OROPHARYNX: no exudate  NECK: No palpable mass LYMPH:  no palpable cervical, axillary lymphadenopathy  LUNGS: clear to auscultation and percussion with normal breathing effort HEART: regular rate & rhythm  ABDOMEN: abdomen soft, non-tender and nondistended. Musculoskeletal: no edema NEURO: no focal motor/sensory deficits  Relevant data reviewed during this visit included lab and imaging.

## 2024-04-02 ENCOUNTER — Other Ambulatory Visit: Payer: Self-pay

## 2024-04-02 DIAGNOSIS — C649 Malignant neoplasm of unspecified kidney, except renal pelvis: Secondary | ICD-10-CM

## 2024-04-02 DIAGNOSIS — C641 Malignant neoplasm of right kidney, except renal pelvis: Secondary | ICD-10-CM

## 2024-04-03 ENCOUNTER — Inpatient Hospital Stay (HOSPITAL_BASED_OUTPATIENT_CLINIC_OR_DEPARTMENT_OTHER): Payer: Medicare PPO

## 2024-04-03 ENCOUNTER — Inpatient Hospital Stay: Payer: Medicare PPO

## 2024-04-03 VITALS — BP 109/58 | HR 81 | Temp 97.3°F | Resp 17 | Wt 200.6 lb

## 2024-04-03 DIAGNOSIS — N1832 Chronic kidney disease, stage 3b: Secondary | ICD-10-CM | POA: Insufficient documentation

## 2024-04-03 DIAGNOSIS — R12 Heartburn: Secondary | ICD-10-CM | POA: Insufficient documentation

## 2024-04-03 DIAGNOSIS — L270 Generalized skin eruption due to drugs and medicaments taken internally: Secondary | ICD-10-CM | POA: Diagnosis not present

## 2024-04-03 DIAGNOSIS — C641 Malignant neoplasm of right kidney, except renal pelvis: Secondary | ICD-10-CM

## 2024-04-03 DIAGNOSIS — R21 Rash and other nonspecific skin eruption: Secondary | ICD-10-CM

## 2024-04-03 DIAGNOSIS — I159 Secondary hypertension, unspecified: Secondary | ICD-10-CM

## 2024-04-03 DIAGNOSIS — D649 Anemia, unspecified: Secondary | ICD-10-CM | POA: Diagnosis not present

## 2024-04-03 DIAGNOSIS — Z79899 Other long term (current) drug therapy: Secondary | ICD-10-CM | POA: Diagnosis not present

## 2024-04-03 DIAGNOSIS — R630 Anorexia: Secondary | ICD-10-CM | POA: Insufficient documentation

## 2024-04-03 DIAGNOSIS — C7931 Secondary malignant neoplasm of brain: Secondary | ICD-10-CM | POA: Insufficient documentation

## 2024-04-03 DIAGNOSIS — C61 Malignant neoplasm of prostate: Secondary | ICD-10-CM | POA: Insufficient documentation

## 2024-04-03 DIAGNOSIS — R5383 Other fatigue: Secondary | ICD-10-CM | POA: Insufficient documentation

## 2024-04-03 DIAGNOSIS — Z905 Acquired absence of kidney: Secondary | ICD-10-CM | POA: Diagnosis not present

## 2024-04-03 DIAGNOSIS — Z5112 Encounter for antineoplastic immunotherapy: Secondary | ICD-10-CM | POA: Insufficient documentation

## 2024-04-03 LAB — CBC WITH DIFFERENTIAL (CANCER CENTER ONLY)
Abs Immature Granulocytes: 0.04 10*3/uL (ref 0.00–0.07)
Basophils Absolute: 0 10*3/uL (ref 0.0–0.1)
Basophils Relative: 1 %
Eosinophils Absolute: 0.2 10*3/uL (ref 0.0–0.5)
Eosinophils Relative: 3 %
HCT: 43.3 % (ref 39.0–52.0)
Hemoglobin: 14 g/dL (ref 13.0–17.0)
Immature Granulocytes: 1 %
Lymphocytes Relative: 21 %
Lymphs Abs: 1.2 10*3/uL (ref 0.7–4.0)
MCH: 27.4 pg (ref 26.0–34.0)
MCHC: 32.3 g/dL (ref 30.0–36.0)
MCV: 84.7 fL (ref 80.0–100.0)
Monocytes Absolute: 0.3 10*3/uL (ref 0.1–1.0)
Monocytes Relative: 5 %
Neutro Abs: 4.1 10*3/uL (ref 1.7–7.7)
Neutrophils Relative %: 69 %
Platelet Count: 172 10*3/uL (ref 150–400)
RBC: 5.11 MIL/uL (ref 4.22–5.81)
RDW: 14.8 % (ref 11.5–15.5)
WBC Count: 5.8 10*3/uL (ref 4.0–10.5)
nRBC: 0 % (ref 0.0–0.2)

## 2024-04-03 LAB — CMP (CANCER CENTER ONLY)
ALT: 21 U/L (ref 0–44)
AST: 19 U/L (ref 15–41)
Albumin: 4.2 g/dL (ref 3.5–5.0)
Alkaline Phosphatase: 101 U/L (ref 38–126)
Anion gap: 9 (ref 5–15)
BUN: 17 mg/dL (ref 8–23)
CO2: 27 mmol/L (ref 22–32)
Calcium: 9.6 mg/dL (ref 8.9–10.3)
Chloride: 100 mmol/L (ref 98–111)
Creatinine: 1.65 mg/dL — ABNORMAL HIGH (ref 0.61–1.24)
GFR, Estimated: 43 mL/min — ABNORMAL LOW (ref >60)
Glucose, Bld: 127 mg/dL — ABNORMAL HIGH (ref 70–99)
Potassium: 3.8 mmol/L (ref 3.5–5.1)
Sodium: 136 mmol/L (ref 135–145)
Total Bilirubin: 0.4 mg/dL (ref 0.0–1.2)
Total Protein: 8 g/dL (ref 6.5–8.1)

## 2024-04-03 LAB — LACTATE DEHYDROGENASE: LDH: 147 U/L (ref 98–192)

## 2024-04-03 MED ORDER — TRIAMCINOLONE ACETONIDE 0.5 % EX OINT: 1.0000 | TOPICAL_OINTMENT | Freq: Two times a day (BID) | CUTANEOUS | 0 refills | Status: DC

## 2024-04-03 MED ORDER — SODIUM CHLORIDE 0.9 % IV SOLN: INTRAVENOUS | Status: DC

## 2024-04-03 MED ORDER — SODIUM CHLORIDE 0.9 % IV SOLN
200.0000 mg | Freq: Once | INTRAVENOUS | Status: AC
  Administered 2024-04-03: 200 mg via INTRAVENOUS
  Filled 2024-04-03: qty 200

## 2024-04-03 NOTE — Assessment & Plan Note (Addendum)
 Report not taking BP at home Monitor BP in relation to cancer therapy of lenvatinib . hold amlodipine  at 10 mg daily Lisinopril -hydrochlorothiazide  25 mg daily.

## 2024-04-03 NOTE — Patient Instructions (Signed)

## 2024-04-03 NOTE — Assessment & Plan Note (Addendum)
 Start Kenalog ointment twice daily for rash. Script sent. Once resolved, ok to use OTC hydrocortisone cream if developing new rash. If not improve call for evaluation.

## 2024-04-03 NOTE — Assessment & Plan Note (Addendum)
 Continue lenvatinib  with pembrolizumab   Decrease lenvatinib  to 10 mg daily Monitor for toxicity CT CAP to be scheduled in about 3 months MRI of brain in late April. Ordered today SRS if needed after systemic treatment

## 2024-04-03 NOTE — Assessment & Plan Note (Addendum)
 Dec dose of lenvatinib  to 10 mg daily.

## 2024-04-03 NOTE — Progress Notes (Signed)
 Per Dr. Alita Irwin, okay for patient to receive keytruda  today with creatine 1.65.

## 2024-04-03 NOTE — Assessment & Plan Note (Addendum)
 Stable Solitary kidney with RCC s/p right nephrectomy  Fluid goal 60-70 oz daily

## 2024-04-04 ENCOUNTER — Other Ambulatory Visit (HOSPITAL_COMMUNITY): Payer: Self-pay

## 2024-04-04 ENCOUNTER — Other Ambulatory Visit: Payer: Self-pay

## 2024-04-04 ENCOUNTER — Telehealth: Payer: Self-pay | Admitting: Pharmacy Technician

## 2024-04-04 DIAGNOSIS — C649 Malignant neoplasm of unspecified kidney, except renal pelvis: Secondary | ICD-10-CM

## 2024-04-04 MED ORDER — CABOZANTINIB S-MALATE 60 MG PO TABS
60.0000 mg | ORAL_TABLET | Freq: Every day | ORAL | 11 refills | Status: DC
  Filled 2024-04-15: qty 30, 30d supply, fill #0
  Filled 2024-05-08: qty 30, 30d supply, fill #1

## 2024-04-04 NOTE — Telephone Encounter (Signed)
 Oral Oncology Patient Advocate Encounter  After completing a benefits investigation, prior authorization for Cabometyx is not required at this time through [facility].  Patient's copay is $0.     Patty Benjaman Branch, CPhT Oncology Pharmacy Patient Advocate [facility] Direct Number: [phone] Fax: [phone]

## 2024-04-04 NOTE — Progress Notes (Signed)
 Called and spoke to patient regarding MRI findings of progressive brain metastases.  Given he has been on TKI/immunotherapy with progression, will refer for evaluation for SRS. Will plan on changing TKI to cabozantinib. Meanwhile he will continue lenvatinib . He is asymptomatic.  Will obtain restaging CT of body. He understands.

## 2024-04-07 ENCOUNTER — Encounter: Payer: Self-pay | Admitting: Radiation Oncology

## 2024-04-07 ENCOUNTER — Telehealth: Payer: Self-pay

## 2024-04-07 ENCOUNTER — Other Ambulatory Visit: Payer: Self-pay | Admitting: Radiation Therapy

## 2024-04-07 ENCOUNTER — Telehealth: Payer: Self-pay | Admitting: Radiation Oncology

## 2024-04-07 DIAGNOSIS — C7931 Secondary malignant neoplasm of brain: Secondary | ICD-10-CM

## 2024-04-07 NOTE — Telephone Encounter (Signed)
Left message for patient to call back to schedule consult per 5/9 referral.

## 2024-04-07 NOTE — Progress Notes (Signed)
 78 yo man S/P right radical nephrectomy, IVC thrombectomy 10/27/2022 with cardiopulmonary bypass. Final pathology pt3b. 24 brain mets in Nov treated with Lenvatinib Bary Boss.  Fewer brain mets in Feb MRI.  April MRI Now progression

## 2024-04-07 NOTE — Telephone Encounter (Signed)
 Oral Oncology Pharmacist Encounter  Received new prescription for Cabometyx (cabozantinib) for the treatment of stage IV RCC with brain metastases, planned duration until disease progression or unacceptable toxicity. Per MD, patient will continue lenvatinib  until radiation and will start cabometyx after radiation is complete.   Labs from 04/03/2024 assessed, no dose adjustments needed for this medication for elevated creatinine. Likely will need new TSH level as last one was in March 2025. Prescription dose and frequency assessed for appropriateness.   Current medication list in Epic reviewed, no significant/ releavnet DDIs with Cabometyx identified.  Evaluated chart and no patient barriers to medication adherence noted.   Prescription has been e-scribed to the [facility] for benefits analysis and approval.  Oral Oncology Clinic will continue to follow for insurance authorization, copayment issues, initial counseling and start date.  Jaqulyn Chancellor, PharmD Hematology/Oncology Clinical Pharmacist [facility] Oral Chemotherapy [facility] [phone] 04/07/2024 8:52 AM

## 2024-04-08 NOTE — Progress Notes (Signed)
 Location/Histology of Brain Tumor: Stage IV RCC with brain metastases.  Additional Dx:  Right Renal cell carcinoma (2024)    Prostate Cancer (2019)  Patient presented as referral from Dr. Janeann Mean [facility]) Staging imaging showed brain metastases.  03/23/2024 Dr. Janeann Mean MR Brain with/without Contrast CLINICAL DATA:  Follow-up brain metastasis, evaluate treatment response. History of renal cell carcinoma of right kidney.  IMPRESSION: Numerous enhancing intracranial metastasis several of which are new compared to the prior studies. Several lesions which were present on 10/16/2023 but not visualized on 01/07/2024 are present on the current study. Recommend continued MRI follow-up.   Scattered cystic lesions throughout the cerebrum and cerebellum again noted. There are several cystic lesions which are new or increased in size from prior. Of note, new enhancing lesions in the right frontal periventricular white matter and left cerebellum correspond to previously noted cystic lesions.     Past or anticipated interventions, if any, per neurosurgery: NA  Past or anticipated interventions, if any, per medical oncology:   04/03/2024 Dr. Janeann Mean   Dose of Decadron, if applicable:  No  Recent neurologic symptoms, if any:  Seizures: {:18581} Headaches: {:18581} Nausea: {:18581} Dizziness/ataxia: {:18581} Difficulty with hand coordination: {:18581} Focal numbness/weakness: {:18581} Visual deficits/changes: {:18581} Confusion/Memory deficits: {:18581}  Painful bone metastases at present, if any: {:18581}  SAFETY ISSUES: Prior radiation? {:18581} Pacemaker/ICD? {:18581} Possible current pregnancy? Male Is the patient on methotrexate? No  Additional Complaints / other details:

## 2024-04-09 ENCOUNTER — Ambulatory Visit (HOSPITAL_COMMUNITY)
Admission: RE | Admit: 2024-04-09 | Discharge: 2024-04-09 | Disposition: A | Discharge status: Home / Self Care | Source: Ambulatory Visit | Attending: Radiation Oncology | Admitting: Radiation Oncology

## 2024-04-09 DIAGNOSIS — I6782 Cerebral ischemia: Secondary | ICD-10-CM | POA: Diagnosis not present

## 2024-04-09 DIAGNOSIS — C7931 Secondary malignant neoplasm of brain: Secondary | ICD-10-CM | POA: Insufficient documentation

## 2024-04-09 MED ORDER — GADOBUTROL 1 MMOL/ML IV SOLN
10.0000 mL | Freq: Once | INTRAVENOUS | Status: AC | PRN
  Administered 2024-04-09: 10 mL via INTRAVENOUS

## 2024-04-10 ENCOUNTER — Ambulatory Visit
Admission: RE | Admit: 2024-04-10 | Discharge: 2024-04-10 | Discharge status: Home / Self Care | Source: Ambulatory Visit | Attending: Radiation Oncology

## 2024-04-10 ENCOUNTER — Encounter: Payer: Self-pay | Admitting: Radiation Oncology

## 2024-04-10 ENCOUNTER — Ambulatory Visit
Admission: RE | Admit: 2024-04-10 | Discharge: 2024-04-10 | Disposition: A | Discharge status: Home / Self Care | Source: Ambulatory Visit | Attending: Radiation Oncology | Admitting: Radiation Oncology

## 2024-04-10 VITALS — BP 129/80 | HR 56 | Temp 97.1°F | Resp 18 | Ht 74.0 in | Wt 199.2 lb

## 2024-04-10 DIAGNOSIS — Z8719 Personal history of other diseases of the digestive system: Secondary | ICD-10-CM | POA: Insufficient documentation

## 2024-04-10 DIAGNOSIS — G4730 Sleep apnea, unspecified: Secondary | ICD-10-CM | POA: Insufficient documentation

## 2024-04-10 DIAGNOSIS — Z8546 Personal history of malignant neoplasm of prostate: Secondary | ICD-10-CM | POA: Insufficient documentation

## 2024-04-10 DIAGNOSIS — C649 Malignant neoplasm of unspecified kidney, except renal pelvis: Secondary | ICD-10-CM

## 2024-04-10 DIAGNOSIS — Z79899 Other long term (current) drug therapy: Secondary | ICD-10-CM | POA: Insufficient documentation

## 2024-04-10 DIAGNOSIS — I10 Essential (primary) hypertension: Secondary | ICD-10-CM | POA: Insufficient documentation

## 2024-04-10 DIAGNOSIS — E785 Hyperlipidemia, unspecified: Secondary | ICD-10-CM | POA: Diagnosis not present

## 2024-04-10 DIAGNOSIS — C7931 Secondary malignant neoplasm of brain: Secondary | ICD-10-CM | POA: Diagnosis not present

## 2024-04-10 DIAGNOSIS — Z923 Personal history of irradiation: Secondary | ICD-10-CM | POA: Insufficient documentation

## 2024-04-10 DIAGNOSIS — Z800 Family history of malignant neoplasm of digestive organs: Secondary | ICD-10-CM | POA: Insufficient documentation

## 2024-04-10 DIAGNOSIS — Z809 Family history of malignant neoplasm, unspecified: Secondary | ICD-10-CM | POA: Diagnosis not present

## 2024-04-10 DIAGNOSIS — E119 Type 2 diabetes mellitus without complications: Secondary | ICD-10-CM | POA: Insufficient documentation

## 2024-04-10 DIAGNOSIS — C641 Malignant neoplasm of right kidney, except renal pelvis: Secondary | ICD-10-CM | POA: Insufficient documentation

## 2024-04-10 DIAGNOSIS — Z8042 Family history of malignant neoplasm of prostate: Secondary | ICD-10-CM | POA: Diagnosis not present

## 2024-04-10 DIAGNOSIS — Z860101 Personal history of adenomatous and serrated colon polyps: Secondary | ICD-10-CM | POA: Diagnosis not present

## 2024-04-10 DIAGNOSIS — N529 Male erectile dysfunction, unspecified: Secondary | ICD-10-CM | POA: Diagnosis not present

## 2024-04-10 DIAGNOSIS — M129 Arthropathy, unspecified: Secondary | ICD-10-CM | POA: Diagnosis not present

## 2024-04-10 DIAGNOSIS — Z905 Acquired absence of kidney: Secondary | ICD-10-CM | POA: Insufficient documentation

## 2024-04-10 NOTE — Progress Notes (Signed)
 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 male seen at the request of Dr. Alita Irwin.  He is well-known to our service, having previously completed 5.5 weeks prostate IMRT in September 2019 for a T1c, Gleason 3+4 adenocarcinoma the prostate with pretreatment PSA at 9.45.  He had an excellent response to treatment with PSA nadir at 0.022 as of October 2024.  Since we saw him last, he was diagnosed with a pT3c N0 M0, G3 right papillary renal cell carcinoma, type 2 in December 2023 s/p an open right radical nephrectomy with IVC repair and cardiac bypass for Level 3 thrombus at [facility] on 10/27/2022.  He was noted to have a little local recurrence in the nephrectomy bed as well as pulmonary and approximately 24 brain metastases on restaging imaging in November 2024.  He was started on lenvatinib /pembrolizumab  systemic therapy under the care of Dr. Alita Irwin and a repeat MRI brain in February 2025 showed a good response with decreased size of lesions.  However, a repeat short interval MRI brain scan on 03/23/2024 showed progression with new lesions in the brain.  A 3T MRI brain scan on 04/09/2024 confirmed innumerable (>30) brain metastases within the supratentorial and infratentorial brain, but no change from the 03/23/2024 scan.  He and Dr. Alita Irwin have discussed changing his systemic therapy to dual targeted therapy with cabozantinib/lenvatinib  but he has not yet started this treatment.  He has been kindly referred to us  today  to discuss the potential role for brain radiation.  PREVIOUS RADIATION THERAPY: Yes  07/09/18 - 08/16/18: The prostate was treated to 70 Gy in 28 fractions of 2.5 Gy   PAST MEDICAL HISTORY:  Past Medical History:  Diagnosis Date   Allergy    Anemia    Arthritis    Diabetes mellitus without complication (HCC) 12/2021   ED (erectile dysfunction)    Family history of cancer    Family history of prostate cancer    Genetic testing 05/02/2018   Multi-Cancer panel (83 genes) @ [facility] - No pathogenic mutations detected   History of anemia    due to acute blood loss from lower GI bleeds 2007, 2010, 2013   History of GI diverticular bleed    tranfused 03/ 2010, 01/ 2013/  no blood needed 09/ 2014, 2015   History of lower GI bleeding 08/2006   post colonoscopy w/ polypectomy -- treatment colonoscopy w/ endo clip at polypectomy site and 2PRBCs   History of pneumothorax 07/2008   spontaneous-- tx chest tube   HLD (hyperlipidemia)    HTN (hypertension)    Hx of adenomatous colonic polyps    Hyperplasia of prostate with lower urinary tract symptoms (LUTS)    Mild obstructive sleep apnea    06-21-2018 per pt study done 8 yrs ago, told borderline osa , no cpap   Primary hypogonadism in male    Prostate cancer [facility]) urologist-  dr winter/  oncologist-  dr Lorri Rota  first dx 04/ 2015  Stage T1c, PSA 4.94;  last prostate bx 02-25-2018  Stage T1c, Gleason 3+4, PSA 9.45, vol 120cc-- planned treatement external beam radiation   Wears glasses       PAST SURGICAL HISTORY: Past Surgical History:  Procedure Laterality Date   COLONOSCOPY  02/08/2022   2019   COLONOSCOPY W/ POLYPECTOMY  last one 2014   GOLD SEED IMPLANT N/A 06/26/2018   Procedure: GOLD SEED IMPLANT;  Surgeon: Adelbert Homans, MD;  Location: [facility];  Service: Urology;  Laterality: N/A;  ONLY NEEDS 30 MIN FOR ALL PROCEDURES   PROSTATE BIOPSY  last one 02-25-2018   dr winter office   SPACE OAR INSTILLATION  N/A 06/26/2018   Procedure: SPACE OAR INSTILLATION;  Surgeon: Adelbert Homans, MD;  Location: [facility];  Service: Urology;  Laterality: N/A;   TONSILLECTOMY  child   UMBILICAL HERNIA REPAIR  2012    FAMILY HISTORY:  Family History  Problem Relation Age of Onset   Heart disease Mother    Diabetes Mother    Hypertension Mother    Cancer Father 57       metastatic; unk. primary; deceased 60   Cancer Maternal Uncle        unk. primary   Cancer Paternal Aunt        unk. primary; died young   Colon cancer Paternal Uncle 46       deceased 19   Prostate cancer Cousin 69       maternal first cousin; son of uncle with metastatic cancer   Prostate cancer Cousin        paternal first cousin; son of unaffected paternal aunt   Esophageal cancer Neg Hx    Rectal cancer Neg Hx    Stomach cancer Neg Hx    Colon polyps Neg Hx     SOCIAL HISTORY:  Social History   Socioeconomic History   Marital status: Married    Spouse name: Not on file   Number of children: 2   Years of education: Not on file   Highest education level: Not on file  Occupational History   Occupation: HIGHWAY INSPECTOR    Employer: [employer]  Tobacco Use   Smoking status: Never   Smokeless tobacco: Current    Types: Chew   Tobacco comments:    06-21-2018 chew tobacco since age 55 (since 7s) 27oz/week  (60 yrs)  Vaping Use   Vaping status: Never Used  Substance and Sexual Activity   Alcohol use: Not Currently    Comment: weekend--- case of beer   Drug use: No   Sexual activity: Not on file  Other Topics Concern   Not on file  Social History Narrative   The patient is married and lives in [place].   Social Drivers of Corporate investment banker Strain: Not on file  Food Insecurity: No Food Insecurity (04/10/2024)   Hunger Vital Sign    Worried About Running Out of Food in the Last Year: Never true    Ran Out of Food in the Last Year: Never true   Transportation Needs: No Transportation Needs (04/10/2024)   PRAPARE - Administrator, Civil Service (Medical): No    Lack of Transportation (Non-Medical): No  Physical Activity: Not on file  Stress: Not on file  Social Connections: Not on file  Intimate Partner Violence: Not At Risk (04/10/2024)   Humiliation, Afraid, Rape, and Kick questionnaire  Fear of Current or Ex-Partner: No    Emotionally Abused: No    Physically Abused: No    Sexually Abused: No    ALLERGIES: Bee venom, Aspirin, Tadalafil, Triamterene-hctz, and Penicillins  MEDICATIONS:  Current Outpatient Medications  Medication Sig Dispense Refill   sildenafil (VIAGRA) 100 MG tablet Take 100 mg by mouth as needed.     amLODipine  (NORVASC ) 5 MG tablet Take 2 tablets (10 mg total) by mouth daily.     atorvastatin  (LIPITOR) 10 MG tablet Take 10 mg by mouth in the morning.     cabozantinib (CABOMETYX) 60 MG tablet Take 1 tablet (60 mg total) by mouth daily. Take on an empty stomach, 1 hour before or 2 hours after meals. 30 tablet 11   EPINEPHrine  0.3 mg/0.3 mL IJ SOAJ injection Inject 0.3 mg into the muscle as needed for anaphylaxis.     finasteride  (PROSCAR ) 5 MG tablet Take 5 mg by mouth in the morning.     Grape Seed Extract 100 MG CAPS Take 300 mg by mouth. 300 mg     latanoprost  (XALATAN ) 0.005 % ophthalmic solution 1 drop at bedtime.     lenvatinib  20 mg daily dose (LENVIMA , 20 MG DAILY DOSE,) 2 x 10 MG capsule Take 10 mg by mouth daily. Told to take half dose (10 mg).     lisinopril -hydrochlorothiazide  (ZESTORETIC ) 20-25 MG tablet 1 tablet Orally Once a day for 90 days     Multiple Vitamins-Minerals (MULTIVITAMIN WITH MINERALS) tablet Take 1 tablet by mouth in the morning.     omeprazole  (PRILOSEC) 20 MG capsule TAKE 1 CAPSULE(20 MG) BY MOUTH DAILY 90 capsule 1   ondansetron  (ZOFRAN ) 8 MG tablet Take 1 tablet (8 mg total) by mouth every 8 (eight) hours as needed for nausea or vomiting. (Patient not taking:  Reported on 12/13/2023) 30 tablet 1   OVER THE COUNTER MEDICATION Take 1 tablet by mouth in the morning. Lion's mane supplement     tamsulosin  (FLOMAX ) 0.4 MG CAPS capsule Take 0.4 mg by mouth daily after breakfast.      triamcinolone  ointment (KENALOG ) 0.5 % Apply 1 Application topically 2 (two) times daily. 30 g 0   No current facility-administered medications for this encounter.    REVIEW OF SYSTEMS:  On review of systems, the patient reports that he is doing well overall.  He denies any neurologic issues such as headaches, dizziness, imbalance, changes in visual or auditory acuity, difficulties with speech or focal weakness in the upper or lower extremities.  He denies any chest pain, shortness of breath, cough, fevers, chills, night sweats, or recent unintended weight changes.  He denies any bowel or bladder disturbances, and denies abdominal pain, nausea or vomiting.  He denies any new musculoskeletal or joint aches or pains. A complete review of systems is obtained and is otherwise negative.    PHYSICAL EXAM:  Wt Readings from Last 3 Encounters:  04/10/24 199 lb 4 oz (90.4 kg)  04/03/24 200 lb 9.6 oz (91 kg)  03/13/24 207 lb 12.8 oz (94.3 kg)   Temp Readings from Last 3 Encounters:  04/10/24 (!) 97.1 F (36.2 C) (Temporal)  04/03/24 (!) 97.3 F (36.3 C) (Temporal)  03/13/24 98.2 F (36.8 C) (Oral)   BP Readings from Last 3 Encounters:  04/10/24 129/80  04/03/24 (!) 109/58  03/13/24 108/71   Pulse Readings from Last 3 Encounters:  04/10/24 (!) 56  04/03/24 81  03/13/24 64    /10  In general this is  a well appearing African-American man in no acute distress.  He's alert and oriented x4 and appropriate throughout the examination. Cardiopulmonary assessment is negative for acute distress and he exhibits normal effort.   KPS = 100  100 - Normal; no complaints; no evidence of disease. 90   - Able to carry on normal activity; minor signs or symptoms of disease. 80   - Normal  activity with effort; some signs or symptoms of disease. 54   - Cares for self; unable to carry on normal activity or to do active work. 60   - Requires occasional assistance, but is able to care for most of his personal needs. 50   - Requires considerable assistance and frequent medical care. 40   - Disabled; requires special care and assistance. 30   - Severely disabled; hospital admission is indicated although death not imminent. 20   - Very sick; hospital admission necessary; active supportive treatment necessary. 10   - Moribund; fatal processes progressing rapidly. 0     - Dead  Karnofsky DA, Abelmann WH, Craver LS and Burchenal [facility] [phone]) The use of the nitrogen mustards in the palliative treatment of carcinoma: with particular reference to bronchogenic carcinoma Cancer 1 634-56  LABORATORY DATA:  Lab Results  Component Value Date   WBC 5.8 04/03/2024   HGB 14.0 04/03/2024   HCT 43.3 04/03/2024   MCV 84.7 04/03/2024   PLT 172 04/03/2024   Lab Results  Component Value Date   NA 136 04/03/2024   K 3.8 04/03/2024   CL 100 04/03/2024   CO2 27 04/03/2024   Lab Results  Component Value Date   ALT 21 04/03/2024   AST 19 04/03/2024   ALKPHOS 101 04/03/2024   BILITOT 0.4 04/03/2024     RADIOGRAPHY: MR Brain W Wo Contrast Addendum Date: 04/10/2024 ADDENDUM REPORT: 04/10/2024 11:08 ADDENDUM: At least 30 enhancing metastases are identified within the supratentorial and infratentorial brain. Electronically Signed   By: Bascom Lily D.O.   On: 04/10/2024 11:08   Result Date: 04/10/2024 CLINICAL DATA:  Provided history: Metastasis to brain. Metastatic disease evaluation. EXAM: MRI HEAD WITHOUT AND WITH CONTRAST TECHNIQUE: Multiplanar, multiecho pulse sequences of the brain and surrounding structures were obtained without and with intravenous contrast. CONTRAST:  10mL GADAVIST  GADOBUTROL  1 MMOL/ML IV SOLN COMPARISON:  Prior brain MRI examinations 03/23/2024 and earlier. FINDINGS: Brain:  Innumerable small enhancing lesions again demonstrated within the supratentorial and infratentorial brain. Enhancing lesions described as new on the recent prior brain MRI of 03/23/2024 in the left temporal lobe and inferior left frontal lobe are not definitively appreciated on today's examination. The remaining enhancing lesions have not appreciably changed from this recent prior MRI. Numerous cystic lesions scattered within the supratentorial and infratentorial brain have also not appreciably changed. As before, some of these cystic lesions may have subtle peripheral enhancement. Mild multifocal T2 FLAIR hyperintense signal abnormality within the cerebral white matter and left basal ganglia, nonspecific but compatible chronic small vessel ischemic disease. There is no acute infarct. No extra-axial fluid collection. No midline shift. Vascular: Maintained flow voids within the proximal large arterial vessels. Skull and upper cervical spine: No focal worrisome marrow lesion. Sinuses/Orbits: No mass or acute finding within the imaged orbits. Trace mucosal thickening within the left maxillary sinus. IMPRESSION: 1. Innumerable small enhancing metastases again demonstrated within the supratentorial and infratentorial brain. Enhancing lesions described as new on the recent prior brain MRI of 03/23/2024 in the left temporal lobe and inferior left frontal  lobe are not definitively appreciated on today's examination. The remaining enhancing lesions have not appreciably changed from this recent prior MRI. 2. Numerous cystic lesions scattered within the supratentorial and infratentorial brain have also not appreciably changed. As before, some of these cystic lesions may have subtle peripheral enhancement. 3. Mild chronic small vessel ischemic changes within the cerebral white matter and left basal ganglia. Electronically Signed: By: Bascom Lily D.O. On: 04/09/2024 16:32   MR Brain W Wo Contrast Result Date:  04/03/2024 CLINICAL DATA:  Follow-up brain metastasis, evaluate treatment response. History of renal cell carcinoma of right kidney. EXAM: MRI HEAD WITHOUT AND WITH CONTRAST TECHNIQUE: Multiplanar, multiecho pulse sequences of the brain and surrounding structures were obtained without and with intravenous contrast. CONTRAST:  9mL GADAVIST  GADOBUTROL  1 MMOL/ML IV SOLN COMPARISON:  01/07/2024 and earlier. FINDINGS: Brain: There are numerous enhancing lesions within the bilateral cerebral hemispheres and cerebellum again noted. Several lesions visualized on the current study were not appreciated on the 01/07/2024 study but correspond to lesions present on the 10/16/2023 study, such lesions are bracketed by arrows on series 1100. There are several enhancing lesions which were not appreciated on either the 01/07/2024 or 10/16/2023 studies which are marked on series 1100 by posteriorly directed double arrows. Representative lesions include: Left temporal lobe (image 152), inferior right occipital (image 151), inferior left frontal lobe (image 164), right frontal periventricular (image 192), left frontal (images 221 and 222), and left cerebellum (image 122). The right frontal periventricular lesion and left cerebellar lesion correspond to cystic foci noted on the prior studies. Previously noted right periatrial white matter lesion measures up to 4 mm with faint peripheral enhancement, previously measuring 6 mm (series 1100, image 189). Similar associated susceptibility with decreased intrinsic T1 hyperintensity. There are numerous new or enlarged cystic lesions, examples of such lesions marked on axial T2 images with double arrows (images 10, 12, 16, 17, 19, and 21). No edema or enhancement associated with the cystic foci. No acute infarct. Scattered and confluent T2/FLAIR hyperintensity in the periventricular and subcortical white matter. No significant edema, mass effect, or midline shift. The basilar cisterns are patent.  No extra-axial fluid collections. Cavum septum pellucidum et vergae noted. Ventricles: Normal size and configuration of the ventricles. Vascular: Skull base flow voids are visualized. Skull and upper cervical spine: No focal abnormality. Sinuses/Orbits: Orbits are symmetric. Mild mucosal thickening in the left maxillary sinus. Other: Mastoid air cells are clear. IMPRESSION: Numerous enhancing intracranial metastasis several of which are new compared to the prior studies. Several lesions which were present on 10/16/2023 but not visualized on 01/07/2024 are present on the current study. Recommend continued MRI follow-up. Scattered cystic lesions throughout the cerebrum and cerebellum again noted. There are several cystic lesions which are new or increased in size from prior. Of note, new enhancing lesions in the right frontal periventricular white matter and left cerebellum correspond to previously noted cystic lesions. Electronically Signed   By: Denny Flack M.D.   On: 04/03/2024 22:12      IMPRESSION/PLAN: 1. 78 y.o. man with >30 brain metastases secondary to progressive, Stage IV renal cell carcinoma.  At this point, the patient would potentially benefit from radiotherapy to the metastatic disease in the brain. The options include whole brain irradiation versus stereotactic radiosurgery [facility]). There are pros and cons associated with each of these potential treatment options. Whole brain radiotherapy would treat the known metastatic deposits and help provide some reduction of risk for future brain metastases. However, whole  brain radiotherapy carries potential risks including hair loss, subacute somnolence, and neurocognitive changes including a possible reduction in short-term memory. Whole brain radiotherapy also may carry a lower likelihood of tumor control at the treatment sites because of the low-dose used. Stereotactic radiosurgery carries a higher likelihood for local tumor control at the targeted  sites with lower associated risk for neurocognitive changes such as memory loss. However, the use of stereotactic radiosurgery in this setting may leave the patient at increased risk for new brain metastases elsewhere in the brain as high as 50-60%. Accordingly, patients who receive stereotactic radiosurgery in this setting should undergo ongoing surveillance imaging with brain MRI more frequently in order to identify and treat new small brain metastases before they become symptomatic. Stereotactic radiosurgery does carry some different risks, including a risk of radionecrosis.  PLAN: Today, we reviewed the findings and workup thus far with the patient. We discussed the dilemma regarding whole brain radiotherapy versus stereotactic radiosurgery but given the number of lesions, greater than 30, he is not currently a candidate for stereotactic radiosurgery [facility]). We discussed the pros and cons of both as well as the logistics and delivery of each. He and his wife are quite concerned with the risk for reduction in short-term memory and/or dementia, associated with the whole brain radiation.  We reviewed the results associated with each of the treatments described above and at present, the recommended treatment approach would be to proceed with a 2-week course of whole brain radiation (WBRT) or, proceed with the new systemic therapy regimen and repeat an MRI scan in 3 months to assess response.  The patient seems to understand the treatment options and pending input from Dr. Alita Irwin, is willing to proceed with whichever approach is recommended.  He has a scheduled follow-up visit with Dr. Alita Irwin on 04/11/2024 to discuss this further.  If proceeding with whole brain radiation (WBRT) is recommended, we are happy to help coordinate for CT simulation/treatment planning in anticipation of beginning the daily radiation treatments in the near future.  We enjoyed meeting with him and his wife again today and look forward to  continuing to participate in his care.  We personally spent 70 minutes in this encounter including chart review, reviewing radiological studies, meeting face-to-face with the patient, entering orders and completing documentation.    Arta Bihari, PA-C    Kenith Payer, MD  [facility]  Radiation Oncology Direct Dial: [phone]  Fax: [phone] [email or web address]  Skype  LinkedIn

## 2024-04-10 NOTE — Progress Notes (Unsigned)
 [facility] OFFICE PROGRESS NOTE  Patient Care Team: Rae Bugler, MD as PCP - General (Family Medicine)  [name] is a 78 y.o. male with history of DM, HTN, HLD, OSA, and prostate cancer (T1c GG2 GS 3+4) s/p EBRT alone in 2019 and Right papillary RCC s/p nephrectomy in 10/2022 referred to Medical Oncology Clinic for [facility]. Currently with stage IV RCC. Staging imaging showed brain metastases, and pulmonary nodules suggestive of lung metastases.  Now with progressing CNS metastases. Long discussion today about his brain metastases. Report of more than 30 metastatic lesions though small in sizes. He is asymptomatic. Discussed treatment is palliative and not curable again. All modalities will likely result in response for a period of time but will experience progression. Discussed risk and benefits of WBRT vs TKI. Discussed data is less clear regarding papillary subtype of CNS response. One study showed ORR of 55% in all comers.  JAMA 2021. Discussed GOC, palliative care with hospice as well. He and his wife are not ready for hospice. They would like to continue treatment as able given his excellent PS. This is very reasonable. After discussion, he elects to proceed with TKI and delay WBRT for now. Will repeat imaging in 2-3 months for evaluation. Continue follow up clinically for new symptoms.    Current diagnosis: stage IV RCC with brain metastases Initial diagnosis: 10/2022. pT3cN0M0 G3 right papillary RCC, type 2 Previous Treatment: 10/27/22 Open radical nephrectomy with IVC repair, cardiac bypass for level 3 thrombus at [facility] Renal function: 10/09/23 Cr. 1.53. Labs: Ca 10.2. WBC 6.7. HGB 13.4 PLT 221. ANC 4.4   10/2023 first-line treatment cycle 1 day 1 lenvatinib  with pembrolizumab .   Will plan to start Cabozantinib next week when received it.  ACP completed. 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 Will start cabo and repeat MRI in the short term for evaluation Rash On Kenalog  ointment twice daily for rash.  Almost resolved, continue Kenalog  until resolved. If not improve call for evaluation. Decreased appetite Marinol 5 mg twice daily  I spent a total of 53 minutes including review of chart and various tests results, face-to-face time with the patient, discussions about results, plan of care and coordination of care plan with other providers and staff members. Discussed GOC.  Follow up as scheduled with lab and see me. Will discontinue pembro.  Orders Placed This Encounter  Procedures   CBC with Differential (Cancer Center Only)    Standing Status:   Future    Expiration Date:   04/11/2025   CMP (Cancer Center only)    Standing Status:   Future    Expiration Date:   05/12/2024   Lactate dehydrogenase    Standing Status:   Future    Expiration Date:   04/11/2025   T4, free    Standing Status:   Future    Expiration Date:   05/12/2024   TSH    Standing Status:   Future    Expiration Date:   05/12/2024     Garrett Ruddy, MD  INTERVAL HISTORY: Patient returns for follow-up. Met with Dr. Lorri Rota. No neurologic symptoms or headaches. Rash is resolving. No other symptoms.  Oncology History  Malignant neoplasm of prostate (HCC)  04/01/2018 Initial Diagnosis   Malignant neoplasm of prostate (HCC)   04/30/2018 Genetic Testing   Multi-Cancer panel (83 genes) @ [facility] - No pathogenic mutations detected Variants of  Uncertain Significance in ATM, SDHA and TSC2  Genes Analyzed: 83 genes on [facility]'s Multi-Cancer panel (ALK, APC, ATM, AXIN2, BAP1, BARD1, BLM, BMPR1A, BRCA1, BRCA2, BRIP1, CASR, CDC73, CDH1, CDK4, CDKN1B, CDKN1C, CDKN2A, CEBPA, CHEK2, CTNNA1, DICER1, DIS3L2, EGFR, EPCAM, FH, FLCN, GATA2, GPC3, GREM1, HOXB13, HRAS, KIT, MAX, MEN1, MET, MITF, MLH1, MSH2, MSH3, MSH6, MUTYH, NBN, NF1, NF2, NTHL1, PALB2, PDGFRA, PHOX2B, PMS2, POLD1, POLE, POT1, PRKAR1A, PTCH1,  PTEN, RAD50, RAD51C, RAD51D, RB1, RECQL4, RET, RUNX1, SDHA, SDHAF2, SDHB, SDHC, SDHD, SMAD4, SMARCA4, SMARCB1, SMARCE1, STK11, SUFU, TERC, TERT, TMEM127, TP53, TSC1, TSC2, VHL, WRN, WT1).  UPDATE: ATM c.7988T>C VUS was amended to "Likely Benign" due to a re-review of the evidence in light of new variant interpretation guidelines and/or new information.   Renal cell carcinoma, right (HCC)  09/29/2022 Imaging   CT chest 1.  No evidence of metastatic disease in the thorax.  2.  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 Consider correlation with genetic testing.  3.  Reference same day abdominal MRI for details regarding the right renal mass and associated tumor thrombus in the right renal vein and IVC    09/29/2022 Imaging   MRI 1.  Right interpolar renal 3.5 cm mass concerning for primary renal malignancy. Upper pole and lower pole tumor is favored to primarily be intravenous tumor thrombus, with intravenous tumor expanding the right renal vein, and the subdiaphragmatic IVC as detailed above. Differential includes renal cell carcinoma (favored) and transitional cell carcinoma.  2.  Please see outside 08/25/2022 CT abdomen and pelvis for evaluation of the collecting system on delayed/urographic phase.  3.  No definite lymphadenopathy.  4.  Signal changes in the L5 superior endplate are favored to reflect a fracture. However, recommend continued attention on follow-up.    10/27/2022 Pathology Results   SPECIMEN    Procedure:    Radical nephrectomy     Specimen Laterality:    Right   TUMOR    Tumor Focality:    Unifocal     Tumor Size:    Greatest Dimension (Centimeters): 8.2 cm     Histologic Type:    Papillary renal cell carcinoma, type 2     Histologic Grade (WHO / ISUP):    G3 (nucleoli conspicuous and eosinophilic at 100x magnification)     Tumor Extent:    Extends into perinephric tissue (beyond renal capsule)      Tumor Extent:    Extends into renal sinus     Tumor Extent:    Extends into major vein (renal vein or its segmental branches, inferior vena cava)     Sarcomatoid Features:    Not identified     Rhabdoid Features:    Not identified     Tumor Necrosis:    Not identified     Lymphovascular Invasion:    Present   MARGINS    Margin Status:    Invasive carcinoma present at margin       Margin(s) Involved by Invasive Carcinoma:    Renal vein: present in renal vein nephrectomy margin; No tumor seen is separately submitted renal vein segment.   REGIONAL LYMPH NODES     Regional Lymph Node Status:    Not applicable (no regional lymph nodes submitted or found)   PATHOLOGIC STAGE CLASSIFICATION (pTNM, AJCC 8th Edition)     Reporting of pT, pN, and (when applicable) pM categories is based on information available to the pathologist at the time the report  is issued. As per the AJCC (Chapter 1, 8th Ed.) it is the managing physician's responsibility to establish the final  pathologic stage based upon all pertinent information, including but potentially not limited to this pathology report.     Primary Tumor (pT):    pT3b     Regional Lymph Nodes (pN):    pN not assigned (no nodes submitted or found)    03/08/2023 Imaging   CT CAP Status post radical right nephrectomy, IVC repair, and partial right adrenalectomy without evidence of recurrent or metastatic disease.  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  L5 compression deformity without progressive interval height loss  Lungs/Pleura: No large airspace consolidation or pleural effusion. Similar distribution of numerous thin-walled pulmonary cysts throughout the bilateral lungs. Bibasilar atelectasis/scarring. No new pulmonary nodules.    09/13/2023 Imaging   CT CAP 5 mm left lower lobe pulmonary nodule Solid upper lobe pulmonary nodule 3 mm 4 mm right lower lobe  nodule Sclerotic focus T1 change from prior likely bone island.  No aggressive lytic lesion.  Multilevel degenerative changes of spine and bilateral shoulder Similar appearance of sclerotic superior endplate deformity at L5 unchanged dating back to September 2023.  New from April 2019. 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 No pathologically enlarged abdominal or pelvic lymph nodes.   10/08/2023 Initial Diagnosis   Renal cell carcinoma, right (HCC)   11/08/2023 - 04/03/2024 Chemotherapy   Patient is on Treatment Plan : BLADDER Pembrolizumab  (200) q21d        PHYSICAL EXAMINATION: ECOG PERFORMANCE STATUS: 1 - Symptomatic but completely ambulatory  Vitals:   04/11/24 0851  BP: (!) 134/95  Pulse: 68  Resp: 13  Temp: (!) 97.3 F (36.3 C)  SpO2: 100%   Filed Weights   04/11/24 0851  Weight: 200 lb 12.8 oz (91.1 kg)    GENERAL: alert, no distress and comfortable LUNGS: clear to auscultation and percussion with normal breathing effort ABDOMEN: abdomen soft, non-tender and nondistended. Musculoskeletal: no edema NEURO: no focal motor/sensory deficits  Relevant data reviewed during this visit included labs and imaging.

## 2024-04-10 NOTE — Assessment & Plan Note (Addendum)
 More than 30 lesions on MRI and not a SRS candidate. Will start cabo and repeat MRI in the short term for evaluation

## 2024-04-11 ENCOUNTER — Telehealth: Payer: Self-pay | Admitting: Radiation Therapy

## 2024-04-11 ENCOUNTER — Other Ambulatory Visit (HOSPITAL_BASED_OUTPATIENT_CLINIC_OR_DEPARTMENT_OTHER): Payer: Self-pay

## 2024-04-11 ENCOUNTER — Other Ambulatory Visit: Payer: Self-pay | Admitting: Radiation Therapy

## 2024-04-11 ENCOUNTER — Inpatient Hospital Stay

## 2024-04-11 VITALS — BP 134/95 | HR 68 | Temp 97.3°F | Resp 13 | Wt 200.8 lb

## 2024-04-11 DIAGNOSIS — N1832 Chronic kidney disease, stage 3b: Secondary | ICD-10-CM | POA: Diagnosis not present

## 2024-04-11 DIAGNOSIS — R21 Rash and other nonspecific skin eruption: Secondary | ICD-10-CM | POA: Diagnosis not present

## 2024-04-11 DIAGNOSIS — Z79899 Other long term (current) drug therapy: Secondary | ICD-10-CM | POA: Diagnosis not present

## 2024-04-11 DIAGNOSIS — Z5112 Encounter for antineoplastic immunotherapy: Secondary | ICD-10-CM | POA: Diagnosis not present

## 2024-04-11 DIAGNOSIS — C7931 Secondary malignant neoplasm of brain: Secondary | ICD-10-CM | POA: Diagnosis not present

## 2024-04-11 DIAGNOSIS — R630 Anorexia: Secondary | ICD-10-CM

## 2024-04-11 DIAGNOSIS — L270 Generalized skin eruption due to drugs and medicaments taken internally: Secondary | ICD-10-CM | POA: Diagnosis not present

## 2024-04-11 DIAGNOSIS — I159 Secondary hypertension, unspecified: Secondary | ICD-10-CM | POA: Diagnosis not present

## 2024-04-11 DIAGNOSIS — R5383 Other fatigue: Secondary | ICD-10-CM | POA: Diagnosis not present

## 2024-04-11 DIAGNOSIS — R12 Heartburn: Secondary | ICD-10-CM | POA: Diagnosis not present

## 2024-04-11 DIAGNOSIS — C61 Malignant neoplasm of prostate: Secondary | ICD-10-CM | POA: Diagnosis not present

## 2024-04-11 DIAGNOSIS — C641 Malignant neoplasm of right kidney, except renal pelvis: Secondary | ICD-10-CM | POA: Diagnosis not present

## 2024-04-11 DIAGNOSIS — D649 Anemia, unspecified: Secondary | ICD-10-CM | POA: Diagnosis not present

## 2024-04-11 DIAGNOSIS — C649 Malignant neoplasm of unspecified kidney, except renal pelvis: Secondary | ICD-10-CM | POA: Diagnosis not present

## 2024-04-11 DIAGNOSIS — Z905 Acquired absence of kidney: Secondary | ICD-10-CM | POA: Diagnosis not present

## 2024-04-11 MED ORDER — DRONABINOL 5 MG PO CAPS
5.0000 mg | ORAL_CAPSULE | Freq: Two times a day (BID) | ORAL | 0 refills | Status: DC
  Filled 2024-04-11 – 2024-04-24 (×2): qty 60, 30d supply, fill #0

## 2024-04-11 NOTE — Telephone Encounter (Signed)
 I spoke with [name] about the brain MRI that has been set up for him in late July. He has access to MyChart and plans to attend.   Axel Bohr R.T(R)(T) Radiation Special Procedures Lead

## 2024-04-11 NOTE — Assessment & Plan Note (Signed)
 Marinol 5 mg twice daily

## 2024-04-11 NOTE — Assessment & Plan Note (Addendum)
 On Kenalog  ointment twice daily for rash.  Almost resolved, continue Kenalog  until resolved. If not improve call for evaluation.

## 2024-04-14 ENCOUNTER — Other Ambulatory Visit (HOSPITAL_BASED_OUTPATIENT_CLINIC_OR_DEPARTMENT_OTHER): Payer: Self-pay

## 2024-04-15 ENCOUNTER — Other Ambulatory Visit (HOSPITAL_COMMUNITY): Payer: Self-pay

## 2024-04-15 ENCOUNTER — Other Ambulatory Visit: Payer: Self-pay

## 2024-04-15 ENCOUNTER — Other Ambulatory Visit: Payer: Self-pay | Admitting: Pharmacy Technician

## 2024-04-15 NOTE — Progress Notes (Signed)
 Patient counseled on Cabometyx  in telephone encountered opened on 04/07/24.   Charlott Calvario, PharmD Hematology/Oncology Clinical Pharmacist [facility] [phone]

## 2024-04-15 NOTE — Progress Notes (Signed)
 Specialty Pharmacy Initial Fill Coordination Note  [name] is a 78 y.o. male contacted today regarding refills of specialty medication(s) Cabozantinib  S-Malate (CABOMETYX ) .  Patient requested Delivery  on 04/16/24  to verified address [address] [place] [address]   Medication will be filled on 04/15/24.   Patient is aware of $0 copayment.

## 2024-04-15 NOTE — Telephone Encounter (Signed)
 Oral Chemotherapy Pharmacist Encounter  I spoke with patient for overview of: Cabometyx  for the treatment of stage IV renal cell carcinoma with brain metastases, planned duration until disease progression or unacceptable toxicity.   Counseled patient on administration, dosing, side effects, monitoring, drug-food interactions, safe handling, storage, and disposal.  Patient will take Cabometyx  60mg  tablets, 1 tablet (60mg ) by mouth once daily on an empty stomach, 1 hour before or 2 hours after a meal. Patient knows to avoid grapefruit and grapefruit juice.  Cabometyx  start date: 04/17/2024 *Patient knows to stop the Lenvima  prior to starting the Cabometyx . Patient agrees with plan.   Adverse effects include but are not limited to: diarrhea, nausea, decreased appetite, fatigue, hypertension, hand-foot syndrome, decreased blood counts, and electrolyte abnormalities. Patient will obtain anti diarrheal and alert the office of 4 or more loose stools above baseline. **Patient states that he has been having low taste and low appetite while on the lenvima . He states taht since lowering the dose this has returned. Patient notified to let us  know if this starts to decrease again once he begins this new medication, Cabometyx .   Patient informed that Cabometyx  should be held at least 3 weeks prior to any scheduled surgery (including dental surgery) and not resumed until at least 2 weeks after major surgery and until adequate wound healing is established.  Reviewed with patient importance of keeping a medication schedule and plan for any missed doses. No barriers to medication adherence identified.  Medication reconciliation performed and medication/allergy list updated. All questions answered. Patient voiced understanding and appreciation.   Medication education handout placed in mail for patient. Patient knows to call the office with questions or concerns. Oral Chemotherapy Clinic phone number provided to  patient.   [name], PharmD Hematology/Oncology Clinical Pharmacist [facility] [phone] 04/15/2024 11:04 AM

## 2024-04-17 ENCOUNTER — Ambulatory Visit (HOSPITAL_COMMUNITY)
Admission: RE | Admit: 2024-04-17 | Discharge: 2024-04-17 | Disposition: A | Discharge status: Home / Self Care | Source: Ambulatory Visit

## 2024-04-17 ENCOUNTER — Other Ambulatory Visit (HOSPITAL_BASED_OUTPATIENT_CLINIC_OR_DEPARTMENT_OTHER): Payer: Self-pay

## 2024-04-17 ENCOUNTER — Other Ambulatory Visit: Payer: Self-pay

## 2024-04-17 ENCOUNTER — Other Ambulatory Visit (HOSPITAL_COMMUNITY): Payer: Self-pay

## 2024-04-17 DIAGNOSIS — C7931 Secondary malignant neoplasm of brain: Secondary | ICD-10-CM | POA: Insufficient documentation

## 2024-04-17 DIAGNOSIS — C649 Malignant neoplasm of unspecified kidney, except renal pelvis: Secondary | ICD-10-CM | POA: Diagnosis not present

## 2024-04-17 DIAGNOSIS — K838 Other specified diseases of biliary tract: Secondary | ICD-10-CM | POA: Diagnosis not present

## 2024-04-17 DIAGNOSIS — I700 Atherosclerosis of aorta: Secondary | ICD-10-CM | POA: Diagnosis not present

## 2024-04-17 MED ORDER — IOHEXOL 300 MG/ML  SOLN
80.0000 mL | Freq: Once | INTRAMUSCULAR | Status: AC | PRN
  Administered 2024-04-17: 80 mL via INTRAVENOUS

## 2024-04-18 ENCOUNTER — Other Ambulatory Visit (HOSPITAL_COMMUNITY): Payer: Self-pay

## 2024-04-22 NOTE — Assessment & Plan Note (Addendum)
 Stable Solitary kidney with RCC s/p right nephrectomy  Fluid goal 60-70 oz daily

## 2024-04-22 NOTE — Progress Notes (Unsigned)
 [facility] OFFICE PROGRESS NOTE  Patient Care Team: Garrett Bugler, MD as PCP - General (Family Medicine)  [name] is a 78 y.o. male with history of DM, HTN, HLD, OSA, and prostate cancer (T1c GG2 GS 3+4) s/p EBRT alone in 2019 and Right papillary RCC s/p nephrectomy in 10/2022 referred to Medical Oncology Clinic for [facility]. Currently with stage IV RCC. Staging imaging showed brain metastases, and pulmonary nodules suggestive of lung metastases.   Now with progressing CNS metastases. Stable systemically without progression. Wife and him decided not to pursue brain radiation. Will try systemic treatment. Concerning for demential.    Current diagnosis: stage IV RCC with brain metastases Initial diagnosis: 10/2022. pT3cN0M0 G3 right papillary RCC, type 2 Previous Treatment: 10/27/22 Open radical nephrectomy with IVC repair, cardiac bypass for level 3 thrombus at [facility] Renal function: 10/09/23 Cr. 1.53. Labs: Ca 10.2. WBC 6.7. HGB 13.4 PLT 221. ANC 4.4   10/2023 first-line treatment cycle 1 day 1 lenvatinib  with pembrolizumab .  04/17/24 2nd line cabozantinib . Will plan to start Cabozantinib .  We went over the CT today. No progression in the body. No new neurologic symptoms. Tolerating. Appetite with slight improvement. Assessment & Plan Renal cell carcinoma, right (HCC) Will continue cabozantinib  at 60 mg daily 1 hour before or 2 hours after meals in the morning.  Monitor for toxicity CT CAP in about 3 months MRI of brain scheduled for late July Rash Healing rash. Almost resolved, continue moisturizer until resolved.  Decreased appetite Improving.  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  Adding b12 and folate to next blood draw.  Orders Placed This Encounter  Procedures   CMP (Cancer Center only)    Standing Status:   Future    Expiration Date:   04/24/2025    Lactate dehydrogenase    Standing Status:   Future    Expiration Date:   04/24/2025   Vitamin B12    Standing Status:   Future    Expiration Date:   04/24/2025   Folate    Standing Status:   Future    Expiration Date:   04/24/2025     Garrett Ruddy, MD  INTERVAL HISTORY: Patient returns for follow-up. Started cabo last week. Diarrhea about 2x per day but not daily. He is not drink a lot of fluid afterwards. No trouble urinating. No nausea, vomiting, mouth pain or sore. No new headache since first daily. No focal weakness.  Oncology History  Malignant neoplasm of prostate (HCC)  04/01/2018 Initial Diagnosis   Malignant neoplasm of prostate (HCC)   04/30/2018 Genetic Testing   Multi-Cancer panel (83 genes) @ [facility] - No pathogenic mutations detected Variants of Uncertain Significance in ATM, SDHA and TSC2  Genes Analyzed: 83 genes on [facility]'s Multi-Cancer panel (ALK, APC, ATM, AXIN2, BAP1, BARD1, BLM, BMPR1A, BRCA1, BRCA2, BRIP1, CASR, CDC73, CDH1, CDK4, CDKN1B, CDKN1C, CDKN2A, CEBPA, CHEK2, CTNNA1, DICER1, DIS3L2, EGFR, EPCAM, FH, FLCN, GATA2, GPC3, GREM1, HOXB13, HRAS, KIT, MAX, MEN1, MET, MITF, MLH1, MSH2, MSH3, MSH6, MUTYH, NBN, NF1, NF2, NTHL1, PALB2, PDGFRA, PHOX2B, PMS2, POLD1, POLE, POT1, PRKAR1A, PTCH1, PTEN, RAD50, RAD51C, RAD51D, RB1, RECQL4, RET, RUNX1, SDHA, SDHAF2, SDHB, SDHC, SDHD, SMAD4, SMARCA4, SMARCB1, SMARCE1, STK11, SUFU, TERC, TERT, TMEM127, TP53, TSC1, TSC2, VHL, WRN, WT1).  UPDATE: ATM c.7988T>C VUS was amended to "Likely Benign" due to a re-review of the evidence in light of new variant interpretation guidelines and/or  new information.   Renal cell carcinoma, right (HCC)  09/29/2022 Imaging   CT chest 1.  No evidence of metastatic disease in the thorax.  2.  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 Consider correlation with genetic testing.  3.  Reference same day abdominal MRI for  details regarding the right renal mass and associated tumor thrombus in the right renal vein and IVC    09/29/2022 Imaging   MRI 1.  Right interpolar renal 3.5 cm mass concerning for primary renal malignancy. Upper pole and lower pole tumor is favored to primarily be intravenous tumor thrombus, with intravenous tumor expanding the right renal vein, and the subdiaphragmatic IVC as detailed above. Differential includes renal cell carcinoma (favored) and transitional cell carcinoma.  2.  Please see outside 08/25/2022 CT abdomen and pelvis for evaluation of the collecting system on delayed/urographic phase.  3.  No definite lymphadenopathy.  4.  Signal changes in the L5 superior endplate are favored to reflect a fracture. However, recommend continued attention on follow-up.    10/27/2022 Pathology Results   SPECIMEN    Procedure:    Radical nephrectomy     Specimen Laterality:    Right   TUMOR    Tumor Focality:    Unifocal     Tumor Size:    Greatest Dimension (Centimeters): 8.2 cm     Histologic Type:    Papillary renal cell carcinoma, type 2     Histologic Grade (WHO / ISUP):    G3 (nucleoli conspicuous and eosinophilic at 100x magnification)     Tumor Extent:    Extends into perinephric tissue (beyond renal capsule)     Tumor Extent:    Extends into renal sinus     Tumor Extent:    Extends into major vein (renal vein or its segmental branches, inferior vena cava)     Sarcomatoid Features:    Not identified     Rhabdoid Features:    Not identified     Tumor Necrosis:    Not identified     Lymphovascular Invasion:    Present   MARGINS    Margin Status:    Invasive carcinoma present at margin       Margin(s) Involved by Invasive Carcinoma:    Renal vein: present in renal vein nephrectomy margin; No tumor seen is separately submitted renal vein segment.   REGIONAL LYMPH NODES     Regional Lymph Node Status:    Not applicable (no regional lymph nodes submitted or found)   PATHOLOGIC STAGE  CLASSIFICATION (pTNM, AJCC 8th Edition)     Reporting of pT, pN, and (when applicable) pM categories is based on information available to the pathologist at the time the report is issued. As per the AJCC (Chapter 1, 8th Ed.) it is the managing physician's responsibility to establish the final  pathologic stage based upon all pertinent information, including but potentially not limited to this pathology report.     Primary Tumor (pT):    pT3b     Regional Lymph Nodes (pN):    pN not assigned (no nodes submitted or found)    03/08/2023 Imaging   CT CAP Status post radical right nephrectomy, IVC repair, and partial right adrenalectomy without evidence of recurrent or metastatic disease.  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  L5 compression deformity without progressive interval height loss  Lungs/Pleura: No large  airspace consolidation or pleural effusion. Similar distribution of numerous thin-walled pulmonary cysts throughout the bilateral lungs. Bibasilar atelectasis/scarring. No new pulmonary nodules.    09/13/2023 Imaging   CT CAP 5 mm left lower lobe pulmonary nodule Solid upper lobe pulmonary nodule 3 mm 4 mm right lower lobe nodule Sclerotic focus T1 change from prior likely bone island.  No aggressive lytic lesion.  Multilevel degenerative changes of spine and bilateral shoulder Similar appearance of sclerotic superior endplate deformity at L5 unchanged dating back to September 2023.  New from April 2019. 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 No pathologically enlarged abdominal or pelvic lymph nodes.   10/08/2023 Initial Diagnosis   Renal cell  carcinoma, right (HCC)   11/08/2023 - 04/03/2024 Chemotherapy   Patient is on Treatment Plan : BLADDER Pembrolizumab  (200) q21d     04/09/2024 Imaging   MRI brain: At least 30 enhancing metastases are identified within the supratentorial and infratentorial brain.   04/17/2024 Imaging   CT CAP 1. Status post right nephrectomy and adrenalectomy. 2. Unchanged appearance of soft tissue nodule in the nephrectomy bed overlying the most superior portion of the right psoas, measuring 1.7 x 0.9 cm which remains highly suspicious for locally recurrent malignancy. 3. Multiple small bilateral pulmonary nodules, not significantly changed, measuring up to 0.6 cm, nonspecific. Continued attention on follow-up. 4. No other evidence of lymphadenopathy or metastatic disease in the chest, abdomen, or pelvis. 5. Similar intra and extrahepatic biliary ductal dilatation, common bile duct dilatation measuring up to 1.1 cm in caliber with associated periportal edema. No calculus or other obstruction visible to the ampulla. 6. Unchanged cystic lesion of the dorsal pancreatic body measuring 1.3 x 1.3 cm. No pancreatic ductal dilatation or surrounding inflammatory changes. 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      PHYSICAL EXAMINATION: ECOG PERFORMANCE STATUS: 1 - Symptomatic but completely ambulatory  Vitals:   04/24/24 0855  BP: 124/66  Pulse: 66  Resp: 20  Temp: (!) 97.3 F (36.3 C)  SpO2: 99%   Filed Weights   04/24/24 0855  Weight: 201 lb 3.2 oz (91.3 kg)    GENERAL: alert, no distress and comfortable SKIN: skin color normal  EYES: sclera clear OROPHARYNX: no exudate  NECK: No palpable mass LYMPH:  no palpable cervical lymphadenopathy  LUNGS: clear to auscultation and percussion with normal breathing effort HEART: regular rate & rhythm  ABDOMEN: abdomen soft, non-tender and  nondistended. Musculoskeletal: no edema NEURO: no focal motor/sensory deficits. Alternating hand movement equal  Relevant data reviewed during this visit included labs and imaging.

## 2024-04-22 NOTE — Assessment & Plan Note (Addendum)
 Will start cabozantinib  Monitor for toxicity CT CAP to be scheduled in about 3 months MRI of brain scheduled for late July SRS if needed after systemic treatment

## 2024-04-22 NOTE — Assessment & Plan Note (Addendum)
 Marinol 5 mg twice daily

## 2024-04-22 NOTE — Assessment & Plan Note (Addendum)
 On Kenalog  ointment twice daily for rash.  Almost resolved, continue Kenalog  until resolved. If not improve call for evaluation.

## 2024-04-24 ENCOUNTER — Other Ambulatory Visit (HOSPITAL_BASED_OUTPATIENT_CLINIC_OR_DEPARTMENT_OTHER): Payer: Self-pay

## 2024-04-24 ENCOUNTER — Telehealth: Payer: Self-pay

## 2024-04-24 ENCOUNTER — Inpatient Hospital Stay (HOSPITAL_BASED_OUTPATIENT_CLINIC_OR_DEPARTMENT_OTHER): Payer: Medicare PPO

## 2024-04-24 ENCOUNTER — Inpatient Hospital Stay: Payer: Medicare PPO

## 2024-04-24 ENCOUNTER — Other Ambulatory Visit: Payer: Self-pay

## 2024-04-24 VITALS — BP 124/66 | HR 66 | Temp 97.3°F | Resp 20 | Wt 201.2 lb

## 2024-04-24 DIAGNOSIS — Z905 Acquired absence of kidney: Secondary | ICD-10-CM | POA: Diagnosis not present

## 2024-04-24 DIAGNOSIS — R21 Rash and other nonspecific skin eruption: Secondary | ICD-10-CM

## 2024-04-24 DIAGNOSIS — D649 Anemia, unspecified: Secondary | ICD-10-CM | POA: Diagnosis not present

## 2024-04-24 DIAGNOSIS — C641 Malignant neoplasm of right kidney, except renal pelvis: Secondary | ICD-10-CM | POA: Diagnosis not present

## 2024-04-24 DIAGNOSIS — Z79899 Other long term (current) drug therapy: Secondary | ICD-10-CM | POA: Diagnosis not present

## 2024-04-24 DIAGNOSIS — R5383 Other fatigue: Secondary | ICD-10-CM | POA: Diagnosis not present

## 2024-04-24 DIAGNOSIS — Z5112 Encounter for antineoplastic immunotherapy: Secondary | ICD-10-CM | POA: Diagnosis not present

## 2024-04-24 DIAGNOSIS — R630 Anorexia: Secondary | ICD-10-CM | POA: Diagnosis not present

## 2024-04-24 DIAGNOSIS — R12 Heartburn: Secondary | ICD-10-CM | POA: Diagnosis not present

## 2024-04-24 DIAGNOSIS — C7931 Secondary malignant neoplasm of brain: Secondary | ICD-10-CM | POA: Diagnosis not present

## 2024-04-24 DIAGNOSIS — C61 Malignant neoplasm of prostate: Secondary | ICD-10-CM | POA: Diagnosis not present

## 2024-04-24 DIAGNOSIS — L270 Generalized skin eruption due to drugs and medicaments taken internally: Secondary | ICD-10-CM | POA: Diagnosis not present

## 2024-04-24 DIAGNOSIS — N1831 Chronic kidney disease, stage 3a: Secondary | ICD-10-CM

## 2024-04-24 DIAGNOSIS — I159 Secondary hypertension, unspecified: Secondary | ICD-10-CM | POA: Diagnosis not present

## 2024-04-24 DIAGNOSIS — N1832 Chronic kidney disease, stage 3b: Secondary | ICD-10-CM | POA: Diagnosis not present

## 2024-04-24 DIAGNOSIS — C649 Malignant neoplasm of unspecified kidney, except renal pelvis: Secondary | ICD-10-CM

## 2024-04-24 LAB — CBC WITH DIFFERENTIAL (CANCER CENTER ONLY)
Abs Immature Granulocytes: 0.01 10*3/uL (ref 0.00–0.07)
Basophils Absolute: 0 10*3/uL (ref 0.0–0.1)
Basophils Relative: 1 %
Eosinophils Absolute: 0.2 10*3/uL (ref 0.0–0.5)
Eosinophils Relative: 3 %
HCT: 38.8 % — ABNORMAL LOW (ref 39.0–52.0)
Hemoglobin: 12.9 g/dL — ABNORMAL LOW (ref 13.0–17.0)
Immature Granulocytes: 0 %
Lymphocytes Relative: 29 %
Lymphs Abs: 1.5 10*3/uL (ref 0.7–4.0)
MCH: 27.9 pg (ref 26.0–34.0)
MCHC: 33.2 g/dL (ref 30.0–36.0)
MCV: 84 fL (ref 80.0–100.0)
Monocytes Absolute: 0.3 10*3/uL (ref 0.1–1.0)
Monocytes Relative: 7 %
Neutro Abs: 3.1 10*3/uL (ref 1.7–7.7)
Neutrophils Relative %: 60 %
Platelet Count: 216 10*3/uL (ref 150–400)
RBC: 4.62 MIL/uL (ref 4.22–5.81)
RDW: 15.6 % — ABNORMAL HIGH (ref 11.5–15.5)
WBC Count: 5.2 10*3/uL (ref 4.0–10.5)
nRBC: 0 % (ref 0.0–0.2)

## 2024-04-24 LAB — CMP (CANCER CENTER ONLY)
ALT: 26 U/L (ref 0–44)
AST: 24 U/L (ref 15–41)
Albumin: 4 g/dL (ref 3.5–5.0)
Alkaline Phosphatase: 91 U/L (ref 38–126)
Anion gap: 7 (ref 5–15)
BUN: 11 mg/dL (ref 8–23)
CO2: 28 mmol/L (ref 22–32)
Calcium: 9.2 mg/dL (ref 8.9–10.3)
Chloride: 100 mmol/L (ref 98–111)
Creatinine: 1.42 mg/dL — ABNORMAL HIGH (ref 0.61–1.24)
GFR, Estimated: 51 mL/min — ABNORMAL LOW (ref >60)
Glucose, Bld: 135 mg/dL — ABNORMAL HIGH (ref 70–99)
Potassium: 3.5 mmol/L (ref 3.5–5.1)
Sodium: 135 mmol/L (ref 135–145)
Total Bilirubin: 0.3 mg/dL (ref 0.0–1.2)
Total Protein: 7.6 g/dL (ref 6.5–8.1)

## 2024-04-24 LAB — FERRITIN: Ferritin: 182 ng/mL (ref 24–336)

## 2024-04-24 LAB — T4, FREE: Free T4: 0.76 ng/dL (ref 0.61–1.12)

## 2024-04-24 LAB — TSH: TSH: 7.53 u[IU]/mL — ABNORMAL HIGH (ref 0.350–4.500)

## 2024-04-24 LAB — LACTATE DEHYDROGENASE: LDH: 163 U/L (ref 98–192)

## 2024-04-24 NOTE — Telephone Encounter (Signed)
 TC to lab, requesting Ferritin level be added to today's labs per Dr. Joseph Nickel request. Shelagh Derrick states this is possible.

## 2024-04-28 ENCOUNTER — Other Ambulatory Visit (HOSPITAL_COMMUNITY): Payer: Self-pay

## 2024-05-06 ENCOUNTER — Other Ambulatory Visit: Payer: Self-pay

## 2024-05-06 ENCOUNTER — Other Ambulatory Visit (HOSPITAL_BASED_OUTPATIENT_CLINIC_OR_DEPARTMENT_OTHER): Payer: Self-pay

## 2024-05-07 ENCOUNTER — Encounter (INDEPENDENT_AMBULATORY_CARE_PROVIDER_SITE_OTHER): Payer: Self-pay

## 2024-05-08 ENCOUNTER — Other Ambulatory Visit: Payer: Self-pay

## 2024-05-08 ENCOUNTER — Other Ambulatory Visit: Payer: Self-pay | Admitting: Pharmacy Technician

## 2024-05-08 ENCOUNTER — Inpatient Hospital Stay

## 2024-05-08 ENCOUNTER — Other Ambulatory Visit (HOSPITAL_COMMUNITY): Payer: Self-pay

## 2024-05-08 ENCOUNTER — Inpatient Hospital Stay: Admitting: Nutrition

## 2024-05-08 VITALS — BP 137/85 | HR 66 | Temp 97.2°F | Resp 18 | Wt 199.0 lb

## 2024-05-08 DIAGNOSIS — E860 Dehydration: Secondary | ICD-10-CM | POA: Diagnosis not present

## 2024-05-08 DIAGNOSIS — R630 Anorexia: Secondary | ICD-10-CM | POA: Diagnosis not present

## 2024-05-08 DIAGNOSIS — C641 Malignant neoplasm of right kidney, except renal pelvis: Secondary | ICD-10-CM | POA: Diagnosis not present

## 2024-05-08 DIAGNOSIS — I159 Secondary hypertension, unspecified: Secondary | ICD-10-CM | POA: Insufficient documentation

## 2024-05-08 DIAGNOSIS — R5383 Other fatigue: Secondary | ICD-10-CM | POA: Diagnosis not present

## 2024-05-08 DIAGNOSIS — C61 Malignant neoplasm of prostate: Secondary | ICD-10-CM | POA: Insufficient documentation

## 2024-05-08 DIAGNOSIS — N1832 Chronic kidney disease, stage 3b: Secondary | ICD-10-CM | POA: Diagnosis not present

## 2024-05-08 DIAGNOSIS — R197 Diarrhea, unspecified: Secondary | ICD-10-CM | POA: Diagnosis not present

## 2024-05-08 DIAGNOSIS — D649 Anemia, unspecified: Secondary | ICD-10-CM

## 2024-05-08 DIAGNOSIS — R21 Rash and other nonspecific skin eruption: Secondary | ICD-10-CM | POA: Insufficient documentation

## 2024-05-08 DIAGNOSIS — K1379 Other lesions of oral mucosa: Secondary | ICD-10-CM

## 2024-05-08 DIAGNOSIS — Z9189 Other specified personal risk factors, not elsewhere classified: Secondary | ICD-10-CM

## 2024-05-08 DIAGNOSIS — E119 Type 2 diabetes mellitus without complications: Secondary | ICD-10-CM | POA: Diagnosis not present

## 2024-05-08 DIAGNOSIS — C7931 Secondary malignant neoplasm of brain: Secondary | ICD-10-CM | POA: Diagnosis not present

## 2024-05-08 DIAGNOSIS — R918 Other nonspecific abnormal finding of lung field: Secondary | ICD-10-CM | POA: Diagnosis not present

## 2024-05-08 DIAGNOSIS — C649 Malignant neoplasm of unspecified kidney, except renal pelvis: Secondary | ICD-10-CM

## 2024-05-08 DIAGNOSIS — I10 Essential (primary) hypertension: Secondary | ICD-10-CM | POA: Diagnosis not present

## 2024-05-08 DIAGNOSIS — E785 Hyperlipidemia, unspecified: Secondary | ICD-10-CM | POA: Insufficient documentation

## 2024-05-08 DIAGNOSIS — G4730 Sleep apnea, unspecified: Secondary | ICD-10-CM | POA: Insufficient documentation

## 2024-05-08 LAB — FOLATE: Folate: 7.3 ng/mL (ref >5.9)

## 2024-05-08 LAB — CMP (CANCER CENTER ONLY)
ALT: 28 U/L (ref 0–44)
AST: 30 U/L (ref 15–41)
Albumin: 3.7 g/dL (ref 3.5–5.0)
Alkaline Phosphatase: 105 U/L (ref 38–126)
Anion gap: 12 (ref 5–15)
BUN: 13 mg/dL (ref 8–23)
CO2: 27 mmol/L (ref 22–32)
Calcium: 9.3 mg/dL (ref 8.9–10.3)
Chloride: 93 mmol/L — ABNORMAL LOW (ref 98–111)
Creatinine: 1.46 mg/dL — ABNORMAL HIGH (ref 0.61–1.24)
GFR, Estimated: 50 mL/min — ABNORMAL LOW (ref >60)
Glucose, Bld: 140 mg/dL — ABNORMAL HIGH (ref 70–99)
Potassium: 3.8 mmol/L (ref 3.5–5.1)
Sodium: 132 mmol/L — ABNORMAL LOW (ref 135–145)
Total Bilirubin: 0.5 mg/dL (ref 0.0–1.2)
Total Protein: 7.8 g/dL (ref 6.5–8.1)

## 2024-05-08 LAB — VITAMIN B12: Vitamin B-12: 669 pg/mL (ref 180–914)

## 2024-05-08 LAB — LACTATE DEHYDROGENASE: LDH: 203 U/L — ABNORMAL HIGH (ref 98–192)

## 2024-05-08 MED ORDER — PREDNISONE 10 MG PO TABS
ORAL_TABLET | ORAL | 0 refills | Status: DC
  Filled 2024-05-08: qty 42, 20d supply, fill #0

## 2024-05-08 MED ORDER — DEXAMETHASONE 0.5 MG/5ML PO SOLN
0.5000 mg | Freq: Four times a day (QID) | ORAL | 0 refills | Status: DC | PRN
  Filled 2024-05-08: qty 100, 5d supply, fill #0

## 2024-05-08 MED ORDER — DEXAMETHASONE 0.5 MG/5ML PO SOLN
0.5000 mg | Freq: Four times a day (QID) | ORAL | 0 refills | Status: DC | PRN
  Filled 2024-05-08: qty 500, 25d supply, fill #0

## 2024-05-08 NOTE — Assessment & Plan Note (Addendum)
 Stable Solitary kidney with RCC s/p right nephrectomy  Fluid goal 60-70 oz daily

## 2024-05-08 NOTE — Progress Notes (Signed)
 [facility] OFFICE PROGRESS NOTE  Patient Care Team: Rae Bugler, MD as PCP - General (Family Medicine)  [name] is a 78 y.o. male with history of DM, HTN, HLD, OSA, and prostate cancer (T1c GG2 GS 3+4) s/p EBRT alone in 2019 and Right papillary RCC s/p nephrectomy in 10/2022 referred to Medical Oncology Clinic for [facility]. Currently with stage IV RCC. Staging imaging showed brain metastases, and pulmonary nodules suggestive of lung metastases.   Now with progressing CNS metastases. Stable systemically without progression. Wife and him decided not to pursue brain radiation. Will try systemic treatment. Concerning for demential.    Current diagnosis: stage IV RCC with brain metastases Initial diagnosis: 10/2022. pT3cN0M0 G3 right papillary RCC, type 2 Previous Treatment: 10/27/22 Open radical nephrectomy with IVC repair, cardiac bypass for level 3 thrombus at [facility] Renal function: 10/09/23 Cr. 1.53. Labs: Ca 10.2. WBC 6.7. HGB 13.4 PLT 221. ANC 4.4   10/2023 first-line treatment cycle 1 day 1 lenvatinib  with pembrolizumab .  04/17/24 2nd line cabozantinib . Will plan to start Cabozantinib .   Some side effects of cabo.  Discussed management of side effects. No new neurologic symptoms.  Occasional headaches unchanged.  Rash seems persistent.  Will start steroid taper. Assessment & Plan Renal cell carcinoma, right (HCC) Will continue cabozantinib  at 60 mg daily 1 hour before or 2 hours after meals in the morning.  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 Patient and wife declined RT to the brain Will start cabo and repeat MRI in the short term for evaluation. Scheduled for 7/30 Other fatigue G1 Report not drinking enough fluid.  Recommend increase fluid intake. Decreased appetite Persist Pending approval of  marinol  Rash Persistent rash. Itching Prednisone 40 mg daily x 3 days, then 30 mg daily x 3 days, then 20 mg x 3 days, then 10 mg daily x 7 days. continue moisturizer.  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 4 times daily as needed keep the medication in the mouth for 5 minutes prior to spitting it out. Do not rinse afterward and avoid eating or drinking for 30 minutes.   At risk for side effect of medication Fluid goal 64 oz per day Monitor for hypertension with baseline hypertension Baseline EKG normal repeat as indicated Check lab with LFT, renal function, electrolytes, every 2 weeks for 2 months and then monthly thereafter free T4 Dental exam periodically Monitor for signs of bleeding, wound healing, thrombosis  I spent a total of 50 minutes including review of chart and various tests results, face-to-face time with the patient, discussions about results, plan of care and prognosis, side effect management.  Orders Placed This Encounter  Procedures   CBC with Differential (Cancer Center Only)    Standing Status:   Future    Expiration Date:   05/08/2025   CMP (Cancer Center only)    Standing Status:   Future    Expiration Date:   05/08/2025   Lactate dehydrogenase    Standing Status:   Future    Expiration Date:   05/08/2025   T4, free    Standing Status:   Future    Expiration Date:   06/07/2024     [name], MD  INTERVAL HISTORY: Patient  returns for follow-up. Appetite is worse on the new pill. No nausea, vomiting. Rash is stable but itching on the shoulder and back. Mouth pain on the gums before switching. Loose stool about once daily.  No coughing.  Slight headache goes away on it's own.  Oncology History  Malignant neoplasm of prostate (HCC)  04/01/2018 Initial Diagnosis   Malignant neoplasm of prostate (HCC)   04/30/2018 Genetic Testing    Multi-Cancer panel (83 genes) @ [facility] - No pathogenic mutations detected Variants of Uncertain Significance in ATM, SDHA and TSC2  Genes Analyzed: 83 genes on [facility]'s Multi-Cancer panel (ALK, APC, ATM, AXIN2, BAP1, BARD1, BLM, BMPR1A, BRCA1, BRCA2, BRIP1, CASR, CDC73, CDH1, CDK4, CDKN1B, CDKN1C, CDKN2A, CEBPA, CHEK2, CTNNA1, DICER1, DIS3L2, EGFR, EPCAM, FH, FLCN, GATA2, GPC3, GREM1, HOXB13, HRAS, KIT, MAX, MEN1, MET, MITF, MLH1, MSH2, MSH3, MSH6, MUTYH, NBN, NF1, NF2, NTHL1, PALB2, PDGFRA, PHOX2B, PMS2, POLD1, POLE, POT1, PRKAR1A, PTCH1, PTEN, RAD50, RAD51C, RAD51D, RB1, RECQL4, RET, RUNX1, SDHA, SDHAF2, SDHB, SDHC, SDHD, SMAD4, SMARCA4, SMARCB1, SMARCE1, STK11, SUFU, TERC, TERT, TMEM127, TP53, TSC1, TSC2, VHL, WRN, WT1).  UPDATE: ATM [id number] VUS was amended to Likely Benign due to a re-review of the evidence in light of new variant interpretation guidelines and/or new information.   Renal cell carcinoma, right (HCC)  09/29/2022 Imaging   CT chest 1.  No evidence of metastatic disease in the thorax.  2.  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 Consider correlation with genetic testing.  3.  Reference same day abdominal MRI for details regarding the right renal mass and associated tumor thrombus in the right renal vein and IVC    09/29/2022 Imaging   MRI 1.  Right interpolar renal 3.5 cm mass concerning for primary renal malignancy. Upper pole and lower pole tumor is favored to primarily be intravenous tumor thrombus, with intravenous tumor expanding the right renal vein, and the subdiaphragmatic IVC as detailed above. Differential includes renal cell carcinoma (favored) and transitional cell carcinoma.  2.  Please see outside 08/25/2022 CT abdomen and pelvis for evaluation of the collecting system on delayed/urographic phase.  3.  No definite lymphadenopathy.  4.  Signal changes in the L5 superior endplate are  favored to reflect a fracture. However, recommend continued attention on follow-up.    10/27/2022 Pathology Results   SPECIMEN    Procedure:    Radical nephrectomy     Specimen Laterality:    Right   TUMOR    Tumor Focality:    Unifocal     Tumor Size:    Greatest Dimension (Centimeters): 8.2 cm     Histologic Type:    Papillary renal cell carcinoma, type 2     Histologic Grade (WHO / ISUP):    G3 (nucleoli conspicuous and eosinophilic at 100x magnification)     Tumor Extent:    Extends into perinephric tissue (beyond renal capsule)     Tumor Extent:    Extends into renal sinus     Tumor Extent:    Extends into major vein (renal vein or its segmental branches, inferior vena cava)     Sarcomatoid Features:    Not identified     Rhabdoid Features:    Not identified     Tumor Necrosis:    Not identified     Lymphovascular Invasion:    Present   MARGINS    Margin Status:    Invasive carcinoma present at margin  Margin(s) Involved by Invasive Carcinoma:    Renal vein: present in renal vein nephrectomy margin; No tumor seen is separately submitted renal vein segment.   REGIONAL LYMPH NODES     Regional Lymph Node Status:    Not applicable (no regional lymph nodes submitted or found)   PATHOLOGIC STAGE CLASSIFICATION (pTNM, AJCC 8th Edition)     Reporting of pT, pN, and (when applicable) pM categories is based on information available to the pathologist at the time the report is issued. As per the AJCC (Chapter 1, 8th Ed.) it is the managing physician's responsibility to establish the final  pathologic stage based upon all pertinent information, including but potentially not limited to this pathology report.     Primary Tumor (pT):    pT3b     Regional Lymph Nodes (pN):    pN not assigned (no nodes submitted or found)    03/08/2023 Imaging   CT CAP Status post radical right nephrectomy, IVC repair, and partial right adrenalectomy without evidence of recurrent or metastatic disease.   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  L5 compression deformity without progressive interval height loss  Lungs/Pleura: No large airspace consolidation or pleural effusion. Similar distribution of numerous thin-walled pulmonary cysts throughout the bilateral lungs. Bibasilar atelectasis/scarring. No new pulmonary nodules.    09/13/2023 Imaging   CT CAP 5 mm left lower lobe pulmonary nodule Solid upper lobe pulmonary nodule 3 mm 4 mm right lower lobe nodule Sclerotic focus T1 change from prior likely bone island.  No aggressive lytic lesion.  Multilevel degenerative changes of spine and bilateral shoulder Similar appearance of sclerotic superior endplate deformity at L5 unchanged dating back to September 2023.  New from April 2019. 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 No pathologically enlarged abdominal or pelvic lymph nodes.   10/08/2023 Initial Diagnosis   Renal cell carcinoma, right (HCC)   11/08/2023 - 04/03/2024 Chemotherapy   Patient is on Treatment Plan : BLADDER Pembrolizumab  (200) q21d     04/09/2024 Imaging   MRI brain: At least 30 enhancing metastases are identified within the supratentorial and infratentorial brain.   04/17/2024 Imaging   CT CAP 1. Status post right nephrectomy and adrenalectomy. 2. Unchanged appearance of soft tissue nodule in the nephrectomy bed overlying the most superior portion of the right psoas, measuring 1.7 x 0.9 cm which remains highly suspicious for locally recurrent malignancy. 3. Multiple small bilateral pulmonary nodules, not significantly changed, measuring up to 0.6 cm, nonspecific. Continued attention on  follow-up. 4. No other evidence of lymphadenopathy or metastatic disease in the chest, abdomen, or pelvis. 5. Similar intra and extrahepatic biliary ductal dilatation, common bile duct dilatation measuring up to 1.1 cm in caliber with associated periportal edema. No calculus or other obstruction visible to the ampulla. 6. Unchanged cystic lesion of the dorsal pancreatic body measuring 1.3 x 1.3 cm. No pancreatic ductal dilatation or surrounding inflammatory changes. 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      PHYSICAL EXAMINATION: ECOG PERFORMANCE STATUS: 1 - Symptomatic but completely ambulatory  Vitals:   05/08/24 1355  BP: 137/85  Pulse: 66  Resp: 18  Temp: (!) 97.2 F (36.2 C)  SpO2: 99%   Filed Weights   05/08/24 1355  Weight: 199 lb (90.3 kg)   GENERAL: alert, no distress and comfortable SKIN: skin color normal and rashes over flank, trunk EYES: sclera clear OROPHARYNX: no exudate  LUNGS: clear to auscultation and percussion with normal breathing effort HEART: regular rate & rhythm  ABDOMEN: abdomen soft, non-tender and nondistended. Musculoskeletal: no edema NEURO: no focal motor/sensory deficits  Relevant data reviewed during this visit included labs.

## 2024-05-08 NOTE — Assessment & Plan Note (Addendum)
 More than 30 lesions on MRI and not a SRS candidate. Patient and wife declined RT to the brain Will start cabo and repeat MRI in the short term for evaluation. Scheduled for 7/30

## 2024-05-08 NOTE — Assessment & Plan Note (Addendum)
 Persist Pending approval of marinol 

## 2024-05-08 NOTE — Assessment & Plan Note (Addendum)
 G1 Report not drinking enough fluid.  Recommend increase fluid intake.

## 2024-05-08 NOTE — Progress Notes (Signed)
 78 year old male diagnosed with stage IV renal cell cancer with metastases to brain, lung, and CNS.  Status post nephrectomy in December, 2023.  He does not follow a special diet.  10/2023 first-line treatment cycle 1 day 1 lenvatinib  with pembrolizumab .  04/17/24 2nd line cabozantinib .  Past medical history includes diabetes, hypertension, hyperlipidemia, OSA, and prostate cancer.  Medications include Marinol , multivitamin, Prilosec.  Labs include sodium 132, glucose 140, BUN 13, creatinine 1.46.  Height: 6 feet 2 inches. Weight:  199 pounds on June 12. 208 pounds 8 ounces March 27. 223 pounds 1.6 ounces January 23. BMI: 25.83  Patient and wife present to nutrition consult.  Patient reports he lost his sense of smell and taste about 5 years ago.  He is unsure why this happened but he is not been able to smell or taste food for quite some time(greater than 5 years).  He denies nausea, vomiting, constipation, and diarrhea.  He does not tolerate spicy foods and reports he can feel the burn but cannot taste the food.  Also does not tolerate acidic fruits such as pineapple.  States MD prescribed Magic mouthwash today.  He has a poor appetite.  Reports early satiety, often feeling full after several bites.  He is unable to force himself to eat more.  His wife provides anything patient desires to eat.  States patient will have oatmeal or cold cereal for breakfast, a banana sandwich at lunch, and a small child-size portion of spaghetti with marinara sauce.  He has enjoyed watermelon recently.  He also tolerates peanut butter, milkshakes and Snickers bars.  States he usually sips 1 soda daily and estimates approximately 20 ounces.  Carbonation is not bothersome.  Confirms he does not drink enough fluids.  He has not tried oral nutrition supplements.  Wife declines Ensure supplements citing an article she read stating Ensure is bad for patients with renal disease/1 kidney.  States article clarified  patient could consume boost or Glucerna.  He is willing to try a variety of [employer] 1.4 as well as protein powder.  He will let me know if he likes these products.  Nutrition diagnosis:  Unintended weight loss related to inadequate oral intake as evidenced by 11% weight loss over 6 months which is clinically significant.  Intervention: Educated on strategies for increasing calories and protein.  Declined nutrition facts sheets on protein as wife feels she is well-educated on foods with protein. Encouraged small and frequent food and beverage intake throughout the day. Encouraged oral nutrition supplements and provided samples. Contact information given.  Monitoring, evaluation, goals: Patient will tolerate adequate calories and protein to minimize loss of lean body mass.  Next visit:  Patient/wife agreed to contact RD to update on tolerance of oral nutrition supplements samples.  **Disclaimer: This note was dictated with voice recognition software. Similar sounding words can inadvertently be transcribed and this note may contain transcription errors which may not have been corrected upon publication of note.**

## 2024-05-08 NOTE — Progress Notes (Signed)
 Specialty Pharmacy Refill Coordination Note  [name] is a 78 y.o. male contacted today regarding refills of specialty medication(s) Cabozantinib  S-Malate (CABOMETYX )   Patient requested Delivery   Delivery date: 05/13/24   Verified address: [address], [place] [address]   Medication will be filled on 05/12/24.

## 2024-05-08 NOTE — Assessment & Plan Note (Addendum)
 Fluid goal 64 oz per day Monitor for hypertension with baseline hypertension Baseline EKG normal repeat as indicated Check lab with LFT, renal function, electrolytes, every 2 weeks for 2 months and then monthly thereafter free T4 Dental exam periodically Monitor for signs of bleeding, wound healing, thrombosis

## 2024-05-08 NOTE — Assessment & Plan Note (Addendum)
 Will start cabozantinib  Monitor for toxicity CT CAP to be scheduled in about 3 months MRI of brain scheduled for late July SRS if needed after systemic treatment

## 2024-05-08 NOTE — Assessment & Plan Note (Addendum)
 Dexamethasone mouthwash. 4 times daily as needed keep the medication in the mouth for 5 minutes prior to spitting it out. Do not rinse afterward and avoid eating or drinking for 30 minutes.

## 2024-05-08 NOTE — Assessment & Plan Note (Addendum)
 Persistent rash. Itching Prednisone 40 mg daily x 3 days, then 30 mg daily x 3 days, then 20 mg x 3 days, then 10 mg daily x 7 days. continue moisturizer.

## 2024-05-09 ENCOUNTER — Other Ambulatory Visit (HOSPITAL_COMMUNITY): Payer: Self-pay

## 2024-05-09 ENCOUNTER — Other Ambulatory Visit (HOSPITAL_BASED_OUTPATIENT_CLINIC_OR_DEPARTMENT_OTHER): Payer: Self-pay

## 2024-05-09 ENCOUNTER — Ambulatory Visit: Admitting: Nutrition

## 2024-05-09 NOTE — Progress Notes (Addendum)
 I received telephone call from patient's wife this morning.  Patient tried [employer] 1.4 chocolate yesterday and did well with it.  She is requesting additional samples.  I contacted Rene Carrier with [employer] to inquire about shipping additional samples.  With patient's permission, I had filled out application on the [employer] to ship samples to patient's home.  They limit the shipment to 2 cartons (servings).  Will request social work referral for additional financial assistance.

## 2024-05-10 ENCOUNTER — Other Ambulatory Visit (HOSPITAL_BASED_OUTPATIENT_CLINIC_OR_DEPARTMENT_OTHER): Payer: Self-pay

## 2024-05-12 ENCOUNTER — Other Ambulatory Visit (HOSPITAL_BASED_OUTPATIENT_CLINIC_OR_DEPARTMENT_OTHER): Payer: Self-pay

## 2024-05-12 ENCOUNTER — Other Ambulatory Visit: Payer: Self-pay

## 2024-05-12 NOTE — Progress Notes (Signed)
 Specialty Pharmacy Ongoing Clinical Assessment Note  [name] is a 78 y.o. male who is being followed by the specialty pharmacy service for [facility] Oncology   Patient's specialty medication(s) reviewed today: Cabozantinib  S-Malate (CABOMETYX )   Missed doses in the last 4 weeks: 0   Patient/Caregiver did not have any additional questions or concerns.   Therapeutic benefit summary: Patient is achieving benefit   Adverse events/side effects summary: Experienced adverse events/side effects (Reported itching, sore gums, and no appetite. Started pred taper for itching (takes the edge off), dexamethasone  rinse for gums, and pending rx for Marinol  for appetite.)   Patient's therapy is appropriate to: Continue    Goals Addressed             This Visit's Progress    Maintain optimal adherence to therapy   On track    Patient is on track. Patient will maintain adherence.  Per visit on 05/08/24, systemically stable without progression and no new neurologic symptoms.          Follow up: 3 months  [facility]

## 2024-05-13 ENCOUNTER — Ambulatory Visit: Admitting: Nutrition

## 2024-05-13 ENCOUNTER — Other Ambulatory Visit: Payer: Self-pay

## 2024-05-13 NOTE — Progress Notes (Signed)
 Contacted patient and patient's wife by telephone.  I have been able to obtain samples of [facility] standard 1.4, chocolate, to give to patient.  Patient's wife wants to pick these up today by 4:00.  I told her I would leave these at the front desk with his name on it.  Patient has done well with this product and appreciates the samples.  They understand to contact me with further questions or concerns.

## 2024-05-19 ENCOUNTER — Other Ambulatory Visit (HOSPITAL_BASED_OUTPATIENT_CLINIC_OR_DEPARTMENT_OTHER): Payer: Self-pay

## 2024-05-20 NOTE — Progress Notes (Unsigned)
 [facility] OFFICE PROGRESS NOTE  Patient Care Team: Seabron Lenis, MD as PCP - General (Family Medicine)  Clete is a 78 y.o. male with history of DM, HTN, HLD, OSA, and prostate cancer (T1c GG2 GS 3+4) s/p EBRT alone in 2019 and Right papillary RCC s/p nephrectomy in 10/2022 referred to Medical Oncology Clinic for [facility]. Currently with stage IV RCC. Staging imaging showed brain metastases, and pulmonary nodules suggestive of lung metastases.   Now with progressing CNS metastases. Stable systemically without progression. Wife and him decided not to pursue brain radiation. Will try systemic treatment. 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   Current diagnosis: stage IV RCC with brain metastases Initial diagnosis: 10/2022. pT3cN0M0 G3 right papillary RCC, type 2 Previous Treatment: 10/27/22 Open radical nephrectomy with IVC repair, cardiac bypass for level 3 thrombus at [facility] Renal function: 10/09/23 Cr. 1.53. Labs: Ca 10.2. WBC 6.7. HGB 13.4 PLT 221. ANC 4.4   10/2023 first-line treatment cycle 1 day 1 lenvatinib  with pembrolizumab .  04/17/24 2nd line cabozantinib .   Assessment & Plan   No orders of the defined types were placed in this encounter.    [name], MD  INTERVAL HISTORY: Patient returns for follow-up.  Oncology History  Malignant neoplasm of prostate (HCC)  04/01/2018 Initial Diagnosis   Malignant neoplasm of prostate (HCC)   04/30/2018 Genetic Testing   Multi-Cancer panel (83 genes) @ [facility] - No pathogenic mutations detected Variants of Uncertain Significance in ATM, SDHA and TSC2  Genes Analyzed: 83 genes on [facility]'s Multi-Cancer panel (ALK, APC, ATM, AXIN2, BAP1, BARD1, BLM, BMPR1A, BRCA1, BRCA2, BRIP1, CASR, CDC73, CDH1, CDK4, CDKN1B, CDKN1C, CDKN2A, CEBPA, CHEK2, CTNNA1, DICER1, DIS3L2, EGFR, EPCAM, FH, FLCN, GATA2, GPC3, GREM1, HOXB13, HRAS, KIT, MAX, MEN1, MET, MITF, MLH1, MSH2, MSH3, MSH6, MUTYH, NBN, NF1, NF2,  NTHL1, PALB2, PDGFRA, PHOX2B, PMS2, POLD1, POLE, POT1, PRKAR1A, PTCH1, PTEN, RAD50, RAD51C, RAD51D, RB1, RECQL4, RET, RUNX1, SDHA, SDHAF2, SDHB, SDHC, SDHD, SMAD4, SMARCA4, SMARCB1, SMARCE1, STK11, SUFU, TERC, TERT, TMEM127, TP53, TSC1, TSC2, VHL, WRN, WT1).  UPDATE: ATM c.7988T>C VUS was amended to Likely Benign due to a re-review of the evidence in light of new variant interpretation guidelines and/or new information.   Renal cell carcinoma, right (HCC)  09/29/2022 Imaging   CT chest 1.  No evidence of metastatic disease in the thorax.  2.  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 Consider correlation with genetic testing.  3.  Reference same day abdominal MRI for details regarding the right renal mass and associated tumor thrombus in the right renal vein and IVC    09/29/2022 Imaging   MRI 1.  Right interpolar renal 3.5 cm mass concerning for primary renal malignancy. Upper pole and lower pole tumor is favored to primarily be intravenous tumor thrombus, with intravenous tumor expanding the right renal vein, and the subdiaphragmatic IVC as detailed above. Differential includes renal cell carcinoma (favored) and transitional cell carcinoma.  2.  Please see outside 08/25/2022 CT abdomen and pelvis for evaluation of the collecting system on delayed/urographic phase.  3.  No definite lymphadenopathy.  4.  Signal changes in the L5 superior endplate are favored to reflect a fracture. However, recommend continued attention on follow-up.    10/27/2022 Pathology Results   SPECIMEN    Procedure:    Radical nephrectomy     Specimen Laterality:    Right   TUMOR    Tumor Focality:  Unifocal     Tumor Size:    Greatest Dimension (Centimeters): 8.2 cm     Histologic Type:    Papillary renal cell carcinoma, type 2     Histologic Grade (WHO / ISUP):    G3 (nucleoli conspicuous and eosinophilic at 100x magnification)      Tumor Extent:    Extends into perinephric tissue (beyond renal capsule)     Tumor Extent:    Extends into renal sinus     Tumor Extent:    Extends into major vein (renal vein or its segmental branches, inferior vena cava)     Sarcomatoid Features:    Not identified     Rhabdoid Features:    Not identified     Tumor Necrosis:    Not identified     Lymphovascular Invasion:    Present   MARGINS    Margin Status:    Invasive carcinoma present at margin       Margin(s) Involved by Invasive Carcinoma:    Renal vein: present in renal vein nephrectomy margin; No tumor seen is separately submitted renal vein segment.   REGIONAL LYMPH NODES     Regional Lymph Node Status:    Not applicable (no regional lymph nodes submitted or found)   PATHOLOGIC STAGE CLASSIFICATION (pTNM, AJCC 8th Edition)     Reporting of pT, pN, and (when applicable) pM categories is based on information available to the pathologist at the time the report is issued. As per the AJCC (Chapter 1, 8th Ed.) it is the managing physician's responsibility to establish the final  pathologic stage based upon all pertinent information, including but potentially not limited to this pathology report.     Primary Tumor (pT):    pT3b     Regional Lymph Nodes (pN):    pN not assigned (no nodes submitted or found)    03/08/2023 Imaging   CT CAP Status post radical right nephrectomy, IVC repair, and partial right adrenalectomy without evidence of recurrent or metastatic disease.  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  L5 compression deformity without progressive interval height loss  Lungs/Pleura: No large airspace consolidation or pleural effusion. Similar distribution of numerous thin-walled pulmonary cysts throughout the bilateral lungs. Bibasilar atelectasis/scarring. No new pulmonary nodules.    09/13/2023 Imaging   CT CAP 5 mm left lower lobe pulmonary  nodule Solid upper lobe pulmonary nodule 3 mm 4 mm right lower lobe nodule Sclerotic focus T1 change from prior likely bone island.  No aggressive lytic lesion.  Multilevel degenerative changes of spine and bilateral shoulder Similar appearance of sclerotic superior endplate deformity at L5 unchanged dating back to September 2023.  New from April 2019. 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 No pathologically enlarged abdominal or pelvic lymph nodes.   10/08/2023 Initial Diagnosis   Renal cell carcinoma, right (HCC)   11/08/2023 - 04/03/2024 Chemotherapy   Patient is on Treatment Plan : BLADDER Pembrolizumab  (200) q21d     04/09/2024 Imaging   MRI brain: At least 30 enhancing metastases are identified within the supratentorial and infratentorial brain.   04/17/2024 Imaging   CT CAP 1. Status post right nephrectomy and adrenalectomy. 2. Unchanged appearance of soft tissue  nodule in the nephrectomy bed overlying the most superior portion of the right psoas, measuring 1.7 x 0.9 cm which remains highly suspicious for locally recurrent malignancy. 3. Multiple small bilateral pulmonary nodules, not significantly changed, measuring up to 0.6 cm, nonspecific. Continued attention on follow-up. 4. No other evidence of lymphadenopathy or metastatic disease in the chest, abdomen, or pelvis. 5. Similar intra and extrahepatic biliary ductal dilatation, common bile duct dilatation measuring up to 1.1 cm in caliber with associated periportal edema. No calculus or other obstruction visible to the ampulla. 6. Unchanged cystic lesion of the dorsal pancreatic body measuring 1.3 x 1.3 cm. No pancreatic ductal dilatation or surrounding inflammatory changes.  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      PHYSICAL EXAMINATION: ECOG PERFORMANCE STATUS: {CHL ONC ECOG PS:[phone]}  There were no vitals filed for this visit. There were no vitals filed for this visit.  Relevant data reviewed during this visit included ***

## 2024-05-22 ENCOUNTER — Inpatient Hospital Stay

## 2024-05-22 VITALS — BP 139/89 | HR 58 | Temp 97.2°F | Resp 18 | Wt 189.6 lb

## 2024-05-22 DIAGNOSIS — C641 Malignant neoplasm of right kidney, except renal pelvis: Secondary | ICD-10-CM | POA: Diagnosis not present

## 2024-05-22 DIAGNOSIS — R5383 Other fatigue: Secondary | ICD-10-CM

## 2024-05-22 DIAGNOSIS — R630 Anorexia: Secondary | ICD-10-CM

## 2024-05-22 DIAGNOSIS — C649 Malignant neoplasm of unspecified kidney, except renal pelvis: Secondary | ICD-10-CM | POA: Diagnosis not present

## 2024-05-22 DIAGNOSIS — E785 Hyperlipidemia, unspecified: Secondary | ICD-10-CM | POA: Diagnosis not present

## 2024-05-22 DIAGNOSIS — C61 Malignant neoplasm of prostate: Secondary | ICD-10-CM | POA: Diagnosis not present

## 2024-05-22 DIAGNOSIS — R21 Rash and other nonspecific skin eruption: Secondary | ICD-10-CM | POA: Diagnosis not present

## 2024-05-22 DIAGNOSIS — G4730 Sleep apnea, unspecified: Secondary | ICD-10-CM | POA: Diagnosis not present

## 2024-05-22 DIAGNOSIS — R918 Other nonspecific abnormal finding of lung field: Secondary | ICD-10-CM | POA: Diagnosis not present

## 2024-05-22 DIAGNOSIS — E860 Dehydration: Secondary | ICD-10-CM

## 2024-05-22 DIAGNOSIS — C7931 Secondary malignant neoplasm of brain: Secondary | ICD-10-CM

## 2024-05-22 DIAGNOSIS — I10 Essential (primary) hypertension: Secondary | ICD-10-CM | POA: Diagnosis not present

## 2024-05-22 DIAGNOSIS — E119 Type 2 diabetes mellitus without complications: Secondary | ICD-10-CM | POA: Diagnosis not present

## 2024-05-22 DIAGNOSIS — I159 Secondary hypertension, unspecified: Secondary | ICD-10-CM

## 2024-05-22 DIAGNOSIS — R197 Diarrhea, unspecified: Secondary | ICD-10-CM | POA: Diagnosis not present

## 2024-05-22 DIAGNOSIS — N1832 Chronic kidney disease, stage 3b: Secondary | ICD-10-CM | POA: Diagnosis not present

## 2024-05-22 LAB — CBC WITH DIFFERENTIAL (CANCER CENTER ONLY)
Abs Immature Granulocytes: 0.03 10*3/uL (ref 0.00–0.07)
Basophils Absolute: 0 10*3/uL (ref 0.0–0.1)
Basophils Relative: 0 %
Eosinophils Absolute: 0.1 10*3/uL (ref 0.0–0.5)
Eosinophils Relative: 1 %
HCT: 45.2 % (ref 39.0–52.0)
Hemoglobin: 15.1 g/dL (ref 13.0–17.0)
Immature Granulocytes: 0 %
Lymphocytes Relative: 24 %
Lymphs Abs: 1.8 10*3/uL (ref 0.7–4.0)
MCH: 27.7 pg (ref 26.0–34.0)
MCHC: 33.4 g/dL (ref 30.0–36.0)
MCV: 82.8 fL (ref 80.0–100.0)
Monocytes Absolute: 0.3 10*3/uL (ref 0.1–1.0)
Monocytes Relative: 4 %
Neutro Abs: 5.2 10*3/uL (ref 1.7–7.7)
Neutrophils Relative %: 71 %
Platelet Count: 185 10*3/uL (ref 150–400)
RBC: 5.46 MIL/uL (ref 4.22–5.81)
RDW: 17.1 % — ABNORMAL HIGH (ref 11.5–15.5)
WBC Count: 7.5 10*3/uL (ref 4.0–10.5)
nRBC: 0 % (ref 0.0–0.2)

## 2024-05-22 LAB — CMP (CANCER CENTER ONLY)
ALT: 38 U/L (ref 0–44)
AST: 26 U/L (ref 15–41)
Albumin: 4.2 g/dL (ref 3.5–5.0)
Alkaline Phosphatase: 86 U/L (ref 38–126)
Anion gap: 6 (ref 5–15)
BUN: 25 mg/dL — ABNORMAL HIGH (ref 8–23)
CO2: 31 mmol/L (ref 22–32)
Calcium: 9.4 mg/dL (ref 8.9–10.3)
Chloride: 94 mmol/L — ABNORMAL LOW (ref 98–111)
Creatinine: 1.59 mg/dL — ABNORMAL HIGH (ref 0.61–1.24)
GFR, Estimated: 45 mL/min — ABNORMAL LOW (ref >60)
Glucose, Bld: 113 mg/dL — ABNORMAL HIGH (ref 70–99)
Potassium: 4.5 mmol/L (ref 3.5–5.1)
Sodium: 131 mmol/L — ABNORMAL LOW (ref 135–145)
Total Bilirubin: 0.6 mg/dL (ref 0.0–1.2)
Total Protein: 7.8 g/dL (ref 6.5–8.1)

## 2024-05-22 LAB — T4, FREE: Free T4: 0.74 ng/dL (ref 0.61–1.12)

## 2024-05-22 LAB — LACTATE DEHYDROGENASE: LDH: 228 U/L — ABNORMAL HIGH (ref 98–192)

## 2024-05-22 NOTE — Assessment & Plan Note (Addendum)
 Report not taking BP consistently at home Monitor BP hold amlodipine  Lisinopril -hydrochlorothiazide  25 mg daily.

## 2024-05-22 NOTE — Assessment & Plan Note (Addendum)
 More than 30 lesions on MRI and not a SRS candidate. Patient and wife declined RT to the brain Will start cabo and repeat MRI in the short term for evaluation. Scheduled for 7/30

## 2024-05-22 NOTE — Assessment & Plan Note (Addendum)
 Will continue cabozantinib  at 60 mg daily 1 hour before or 2 hours after meals in the morning.  Monitor for toxicity CT CAP in end of July/early Aug MRI of brain scheduled for late July

## 2024-05-22 NOTE — Assessment & Plan Note (Signed)
 IVF ordered for as needed. May scheduled about once weekly.

## 2024-05-22 NOTE — Assessment & Plan Note (Addendum)
 Persist [name] [place] causing diarrhea. Wife will get protein powder to mix at home Will check in with dietician.

## 2024-05-26 ENCOUNTER — Telehealth: Payer: Self-pay | Admitting: *Deleted

## 2024-05-26 ENCOUNTER — Other Ambulatory Visit: Payer: Self-pay | Admitting: *Deleted

## 2024-05-26 ENCOUNTER — Telehealth: Payer: Self-pay

## 2024-05-26 NOTE — Telephone Encounter (Signed)
 Called the patient and scheduled appointments with the patients wife. Informed the nurse to contact the patient with updated scan order details.

## 2024-05-26 NOTE — Telephone Encounter (Signed)
 Wife called to say [name] has decided to stop taking chemo pill. He is not doing well mentally and we are wanting to see if we can do the scans earlier.   They will be available to talk with Dr Tina to discuss.

## 2024-05-26 NOTE — Telephone Encounter (Signed)
 MRI rescheduled for 7/3 @ 1:30. Wife verbalized understanding

## 2024-05-29 ENCOUNTER — Ambulatory Visit (HOSPITAL_COMMUNITY)
Admission: RE | Admit: 2024-05-29 | Discharge: 2024-05-29 | Disposition: A | Discharge status: Home / Self Care | Source: Ambulatory Visit | Attending: Radiation Oncology | Admitting: Radiation Oncology

## 2024-05-29 DIAGNOSIS — C7931 Secondary malignant neoplasm of brain: Secondary | ICD-10-CM | POA: Diagnosis not present

## 2024-05-29 DIAGNOSIS — C801 Malignant (primary) neoplasm, unspecified: Secondary | ICD-10-CM | POA: Diagnosis not present

## 2024-05-29 MED ORDER — GADOBUTROL 1 MMOL/ML IV SOLN
9.5000 mL | Freq: Once | INTRAVENOUS | Status: AC | PRN
  Administered 2024-05-29: 9.5 mL via INTRAVENOUS

## 2024-05-31 ENCOUNTER — Inpatient Hospital Stay

## 2024-05-31 VITALS — BP 111/79 | HR 85 | Temp 97.9°F | Resp 15

## 2024-05-31 DIAGNOSIS — R21 Rash and other nonspecific skin eruption: Secondary | ICD-10-CM | POA: Insufficient documentation

## 2024-05-31 DIAGNOSIS — R490 Dysphonia: Secondary | ICD-10-CM | POA: Diagnosis not present

## 2024-05-31 DIAGNOSIS — N1831 Chronic kidney disease, stage 3a: Secondary | ICD-10-CM | POA: Insufficient documentation

## 2024-05-31 DIAGNOSIS — R918 Other nonspecific abnormal finding of lung field: Secondary | ICD-10-CM | POA: Insufficient documentation

## 2024-05-31 DIAGNOSIS — K838 Other specified diseases of biliary tract: Secondary | ICD-10-CM | POA: Diagnosis not present

## 2024-05-31 DIAGNOSIS — K1379 Other lesions of oral mucosa: Secondary | ICD-10-CM | POA: Insufficient documentation

## 2024-05-31 DIAGNOSIS — E860 Dehydration: Secondary | ICD-10-CM | POA: Insufficient documentation

## 2024-05-31 DIAGNOSIS — R609 Edema, unspecified: Secondary | ICD-10-CM | POA: Insufficient documentation

## 2024-05-31 DIAGNOSIS — Z905 Acquired absence of kidney: Secondary | ICD-10-CM | POA: Diagnosis not present

## 2024-05-31 DIAGNOSIS — I129 Hypertensive chronic kidney disease with stage 1 through stage 4 chronic kidney disease, or unspecified chronic kidney disease: Secondary | ICD-10-CM | POA: Diagnosis not present

## 2024-05-31 DIAGNOSIS — E119 Type 2 diabetes mellitus without complications: Secondary | ICD-10-CM | POA: Diagnosis not present

## 2024-05-31 DIAGNOSIS — C641 Malignant neoplasm of right kidney, except renal pelvis: Secondary | ICD-10-CM | POA: Diagnosis not present

## 2024-05-31 DIAGNOSIS — Z5112 Encounter for antineoplastic immunotherapy: Secondary | ICD-10-CM | POA: Diagnosis not present

## 2024-05-31 DIAGNOSIS — C7931 Secondary malignant neoplasm of brain: Secondary | ICD-10-CM | POA: Diagnosis not present

## 2024-05-31 DIAGNOSIS — D509 Iron deficiency anemia, unspecified: Secondary | ICD-10-CM | POA: Insufficient documentation

## 2024-05-31 MED ORDER — SODIUM CHLORIDE 0.9 % IV SOLN: Freq: Once | INTRAVENOUS | Status: AC

## 2024-06-01 NOTE — Progress Notes (Unsigned)
 [facility] OFFICE PROGRESS NOTE  Patient Care Team: Seabron Lenis, [name] as PCP - General (Family Medicine)  [name] is a 78 y.o. male with history of DM, HTN, HLD, OSA, and prostate cancer (T1c GG2 GS 3+4) s/p EBRT alone in 2019 and Right papillary RCC s/p nephrectomy in 10/2022 referred to Medical Oncology Clinic for [facility]. Currently with stage IV RCC. Staging imaging showed brain metastases, and pulmonary nodules suggestive of lung metastases.   Now with progressing CNS metastases. Stable systemically without progression. Wife and him decided not to pursue brain radiation. Will try systemic treatment. 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   Current diagnosis: stage IV RCC with brain metastases Initial diagnosis: 10/2022. pT3cN0M0 G3 right papillary RCC, type 2 Previous Treatment: 10/27/22 Open radical nephrectomy with IVC repair, cardiac bypass for level 3 thrombus at [facility] testing: negative for actionable mutation. Renal function: 10/09/23 Cr. 1.53. Labs: Ca 10.2. WBC 6.7. HGB 13.4 PLT 221. ANC 4.4   10/2023 first-line treatment cycle 1 day 1 lenvatinib  with pembrolizumab .  04/17/24 2nd line cabozantinib .  Declined WBRT.  Some difficulty with his tolerability for cabo. Will need close monitoring. Headache is mild. Suspect CNS disease persistent. He has MRI scheduled next month.  Loss of appetite and weight. Wife will buy new type protein to mix home. Will add IVF as needed.  Will follow up with CT and MRI next month.  Reached out to [facility]. No trials there.   Clinically he needs to be monitored. Concerning for brain metastases progression, and drug side effects. Return in about 3 weeks.  If progression, will consider bev/everlimus. ORR and PFS were higher in patients with papillary subtype and with significant papillary features. Voss MH, et al. JINNY Clin Oncol. 2016 Nov 10;34(32):3846-3853. doi: 10.1200/JCO.2016.67.9084.  Assessment &  Plan   No orders of the defined types were placed in this encounter.    Garrett [name], [name]  INTERVAL HISTORY: Patient returns for follow-up. Headache is not worse, not more frequent. No new weakness. No increase pressure sensation, visual change.  He is drinking fluid. Urine is almost clear. No bloody urine or dysuria. No fever.  He has mouth pain. Dexamethasone  rinse help. Tired. He does feel hungry. Taste is not good. He feels full easily. Protein shake caused diarrhea so he stopped.  Some voice change, hoarseness.  BP is fine at home.  Oncology History  Malignant neoplasm of prostate (HCC)  04/01/2018 Initial Diagnosis   Malignant neoplasm of prostate (HCC)   04/30/2018 Genetic Testing   Multi-Cancer panel (83 genes) @ [facility] - No pathogenic mutations detected Variants of Uncertain Significance in ATM, SDHA and TSC2  Genes Analyzed: 83 genes on [facility]'s Multi-Cancer panel (ALK, APC, ATM, AXIN2, BAP1, BARD1, BLM, BMPR1A, BRCA1, BRCA2, BRIP1, CASR, CDC73, CDH1, CDK4, CDKN1B, CDKN1C, CDKN2A, CEBPA, CHEK2, CTNNA1, DICER1, DIS3L2, EGFR, EPCAM, FH, FLCN, GATA2, GPC3, GREM1, HOXB13, HRAS, KIT, MAX, MEN1, MET, MITF, MLH1, MSH2, MSH3, MSH6, MUTYH, NBN, NF1, NF2, NTHL1, PALB2, PDGFRA, PHOX2B, PMS2, POLD1, POLE, POT1, PRKAR1A, PTCH1, PTEN, RAD50, RAD51C, RAD51D, RB1, RECQL4, RET, RUNX1, SDHA, SDHAF2, SDHB, SDHC, SDHD, SMAD4, SMARCA4, SMARCB1, SMARCE1, STK11, SUFU, TERC, TERT, TMEM127, TP53, TSC1, TSC2, VHL, WRN, WT1).  UPDATE: ATM c.7988T>C VUS was amended to Likely Benign due to a re-review of the evidence in light of new variant interpretation guidelines and/or new information.   Renal cell carcinoma, right (HCC)  09/29/2022 Imaging   CT chest 1.  No evidence of metastatic disease  in the thorax.  2.  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 Consider correlation with genetic testing.  3.  Reference same day  abdominal MRI for details regarding the right renal mass and associated tumor thrombus in the right renal vein and IVC    09/29/2022 Imaging   MRI 1.  Right interpolar renal 3.5 cm mass concerning for primary renal malignancy. Upper pole and lower pole tumor is favored to primarily be intravenous tumor thrombus, with intravenous tumor expanding the right renal vein, and the subdiaphragmatic IVC as detailed above. Differential includes renal cell carcinoma (favored) and transitional cell carcinoma.  2.  Please see outside 08/25/2022 CT abdomen and pelvis for evaluation of the collecting system on delayed/urographic phase.  3.  No definite lymphadenopathy.  4.  Signal changes in the L5 superior endplate are favored to reflect a fracture. However, recommend continued attention on follow-up.    10/27/2022 Pathology Results   SPECIMEN    Procedure:    Radical nephrectomy     Specimen Laterality:    Right   TUMOR    Tumor Focality:    Unifocal     Tumor Size:    Greatest Dimension (Centimeters): 8.2 cm     Histologic Type:    Papillary renal cell carcinoma, type 2     Histologic Grade (WHO / ISUP):    G3 (nucleoli conspicuous and eosinophilic at 100x magnification)     Tumor Extent:    Extends into perinephric tissue (beyond renal capsule)     Tumor Extent:    Extends into renal sinus     Tumor Extent:    Extends into major vein (renal vein or its segmental branches, inferior vena cava)     Sarcomatoid Features:    Not identified     Rhabdoid Features:    Not identified     Tumor Necrosis:    Not identified     Lymphovascular Invasion:    Present   MARGINS    Margin Status:    Invasive carcinoma present at margin       Margin(s) Involved by Invasive Carcinoma:    Renal vein: present in renal vein nephrectomy margin; No tumor seen is separately submitted renal vein segment.   REGIONAL LYMPH NODES     Regional Lymph Node Status:    Not applicable (no regional lymph nodes submitted or found)    PATHOLOGIC STAGE CLASSIFICATION (pTNM, AJCC 8th Edition)     Reporting of pT, pN, and (when applicable) pM categories is based on information available to the pathologist at the time the report is issued. As per the AJCC (Chapter 1, 8th Ed.) it is the managing physician's responsibility to establish the final  pathologic stage based upon all pertinent information, including but potentially not limited to this pathology report.     Primary Tumor (pT):    pT3b     Regional Lymph Nodes (pN):    pN not assigned (no nodes submitted or found)    03/08/2023 Imaging   CT CAP Status post radical right nephrectomy, IVC repair, and partial right adrenalectomy without evidence of recurrent or metastatic disease.  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  L5 compression deformity without progressive interval height loss  Lungs/Pleura: No large airspace consolidation or pleural effusion. Similar distribution of numerous thin-walled pulmonary cysts throughout the bilateral lungs. Bibasilar atelectasis/scarring. No new pulmonary nodules.  09/13/2023 Imaging   CT CAP 5 mm left lower lobe pulmonary nodule Solid upper lobe pulmonary nodule 3 mm 4 mm right lower lobe nodule Sclerotic focus T1 change from prior likely bone island.  No aggressive lytic lesion.  Multilevel degenerative changes of spine and bilateral shoulder Similar appearance of sclerotic superior endplate deformity at L5 unchanged dating back to September 2023.  New from April 2019. 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 No pathologically enlarged abdominal or pelvic lymph nodes.   10/08/2023 Initial  Diagnosis   Renal cell carcinoma, right (HCC)   11/08/2023 - 04/03/2024 Chemotherapy   Patient is on Treatment Plan : BLADDER Pembrolizumab  (200) q21d     04/09/2024 Imaging   MRI brain: At least 30 enhancing metastases are identified within the supratentorial and infratentorial brain.   04/17/2024 Imaging   CT CAP 1. Status post right nephrectomy and adrenalectomy. 2. Unchanged appearance of soft tissue nodule in the nephrectomy bed overlying the most superior portion of the right psoas, measuring 1.7 x 0.9 cm which remains highly suspicious for locally recurrent malignancy. 3. Multiple small bilateral pulmonary nodules, not significantly changed, measuring up to 0.6 cm, nonspecific. Continued attention on follow-up. 4. No other evidence of lymphadenopathy or metastatic disease in the chest, abdomen, or pelvis. 5. Similar intra and extrahepatic biliary ductal dilatation, common bile duct dilatation measuring up to 1.1 cm in caliber with associated periportal edema. No calculus or other obstruction visible to the ampulla. 6. Unchanged cystic lesion of the dorsal pancreatic body measuring 1.3 x 1.3 cm. No pancreatic ductal dilatation or surrounding inflammatory changes. 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      PHYSICAL EXAMINATION: ECOG PERFORMANCE STATUS: 1 - Symptomatic but completely ambulatory  There were no vitals filed for this visit.  There were no vitals filed for this visit.  GENERAL: alert, no distress and comfortable SKIN: skin color normal  EYES: normal, sclera clear OROPHARYNX: no exudate  LUNGS: clear to auscultation and percussion with normal breathing effort HEART: regular rate & rhythm  ABDOMEN: abdomen soft, non-tender and nondistended. Musculoskeletal: no edema NEURO: no focal motor/sensory deficits.   Relevant data reviewed during this visit included labs.  New labs, imaging ordered.

## 2024-06-02 ENCOUNTER — Telehealth: Payer: Self-pay | Admitting: *Deleted

## 2024-06-02 ENCOUNTER — Inpatient Hospital Stay (HOSPITAL_BASED_OUTPATIENT_CLINIC_OR_DEPARTMENT_OTHER)

## 2024-06-02 VITALS — BP 103/66 | HR 75 | Temp 97.3°F | Resp 20 | Wt 187.0 lb

## 2024-06-02 DIAGNOSIS — R918 Other nonspecific abnormal finding of lung field: Secondary | ICD-10-CM | POA: Diagnosis not present

## 2024-06-02 DIAGNOSIS — K1379 Other lesions of oral mucosa: Secondary | ICD-10-CM | POA: Diagnosis not present

## 2024-06-02 DIAGNOSIS — E860 Dehydration: Secondary | ICD-10-CM

## 2024-06-02 DIAGNOSIS — R630 Anorexia: Secondary | ICD-10-CM

## 2024-06-02 DIAGNOSIS — N1831 Chronic kidney disease, stage 3a: Secondary | ICD-10-CM | POA: Diagnosis not present

## 2024-06-02 DIAGNOSIS — C641 Malignant neoplasm of right kidney, except renal pelvis: Secondary | ICD-10-CM | POA: Diagnosis not present

## 2024-06-02 DIAGNOSIS — R21 Rash and other nonspecific skin eruption: Secondary | ICD-10-CM | POA: Diagnosis not present

## 2024-06-02 DIAGNOSIS — R490 Dysphonia: Secondary | ICD-10-CM | POA: Diagnosis not present

## 2024-06-02 DIAGNOSIS — Z905 Acquired absence of kidney: Secondary | ICD-10-CM | POA: Diagnosis not present

## 2024-06-02 DIAGNOSIS — D509 Iron deficiency anemia, unspecified: Secondary | ICD-10-CM | POA: Diagnosis not present

## 2024-06-02 DIAGNOSIS — C649 Malignant neoplasm of unspecified kidney, except renal pelvis: Secondary | ICD-10-CM | POA: Diagnosis not present

## 2024-06-02 DIAGNOSIS — C7931 Secondary malignant neoplasm of brain: Secondary | ICD-10-CM

## 2024-06-02 DIAGNOSIS — R609 Edema, unspecified: Secondary | ICD-10-CM | POA: Diagnosis not present

## 2024-06-02 DIAGNOSIS — Z7189 Other specified counseling: Secondary | ICD-10-CM | POA: Insufficient documentation

## 2024-06-02 DIAGNOSIS — Z5112 Encounter for antineoplastic immunotherapy: Secondary | ICD-10-CM | POA: Diagnosis not present

## 2024-06-02 DIAGNOSIS — K838 Other specified diseases of biliary tract: Secondary | ICD-10-CM | POA: Diagnosis not present

## 2024-06-02 DIAGNOSIS — I129 Hypertensive chronic kidney disease with stage 1 through stage 4 chronic kidney disease, or unspecified chronic kidney disease: Secondary | ICD-10-CM | POA: Diagnosis not present

## 2024-06-02 DIAGNOSIS — E119 Type 2 diabetes mellitus without complications: Secondary | ICD-10-CM | POA: Diagnosis not present

## 2024-06-02 NOTE — Assessment & Plan Note (Addendum)
 Improving after holding cabozantinib 

## 2024-06-02 NOTE — Assessment & Plan Note (Addendum)
 Patient would like to continue fluid this week. May continue as needed.

## 2024-06-02 NOTE — Assessment & Plan Note (Addendum)
 Discussed again today as above.  Wife declines seeing palliative care.

## 2024-06-02 NOTE — Assessment & Plan Note (Addendum)
 Dexamethasone mouthwash. 4 times daily as needed keep the medication in the mouth for 5 minutes prior to spitting it out. Do not rinse afterward and avoid eating or drinking for 30 minutes.

## 2024-06-02 NOTE — Telephone Encounter (Signed)
 Completed [employer] Cancer Claim paperwork   Sent to provider to review, amend, sign and return to this nurse to return to claims benefit manager.

## 2024-06-02 NOTE — Assessment & Plan Note (Addendum)
 More than 30 lesions on MRI and not a SRS candidate. Patient and wife declined RT to the brain Stable disease on cabo but lost QoL and side effects. Holding cabo. Consider bev/everolimus  Will let me know if he would like to proceed Tentatively follow-up next Thursday.

## 2024-06-03 ENCOUNTER — Other Ambulatory Visit (HOSPITAL_COMMUNITY): Payer: Self-pay

## 2024-06-03 ENCOUNTER — Encounter (HOSPITAL_COMMUNITY): Payer: Self-pay

## 2024-06-03 ENCOUNTER — Telehealth: Payer: Self-pay | Admitting: *Deleted

## 2024-06-03 NOTE — Telephone Encounter (Signed)
 [name] has decided to move forward with the new medication Dr Tina has suggested.

## 2024-06-05 ENCOUNTER — Other Ambulatory Visit: Payer: Self-pay

## 2024-06-05 ENCOUNTER — Telehealth: Payer: Self-pay

## 2024-06-05 ENCOUNTER — Other Ambulatory Visit (HOSPITAL_COMMUNITY): Payer: Self-pay

## 2024-06-05 ENCOUNTER — Inpatient Hospital Stay

## 2024-06-05 VITALS — BP 107/66 | HR 63 | Temp 97.9°F | Resp 18

## 2024-06-05 DIAGNOSIS — E860 Dehydration: Secondary | ICD-10-CM

## 2024-06-05 DIAGNOSIS — C641 Malignant neoplasm of right kidney, except renal pelvis: Secondary | ICD-10-CM

## 2024-06-05 DIAGNOSIS — N1831 Chronic kidney disease, stage 3a: Secondary | ICD-10-CM | POA: Diagnosis not present

## 2024-06-05 DIAGNOSIS — K838 Other specified diseases of biliary tract: Secondary | ICD-10-CM | POA: Diagnosis not present

## 2024-06-05 DIAGNOSIS — R490 Dysphonia: Secondary | ICD-10-CM | POA: Diagnosis not present

## 2024-06-05 DIAGNOSIS — C649 Malignant neoplasm of unspecified kidney, except renal pelvis: Secondary | ICD-10-CM

## 2024-06-05 DIAGNOSIS — R918 Other nonspecific abnormal finding of lung field: Secondary | ICD-10-CM | POA: Diagnosis not present

## 2024-06-05 DIAGNOSIS — R21 Rash and other nonspecific skin eruption: Secondary | ICD-10-CM | POA: Diagnosis not present

## 2024-06-05 DIAGNOSIS — Z905 Acquired absence of kidney: Secondary | ICD-10-CM | POA: Diagnosis not present

## 2024-06-05 DIAGNOSIS — C7931 Secondary malignant neoplasm of brain: Secondary | ICD-10-CM | POA: Diagnosis not present

## 2024-06-05 DIAGNOSIS — D509 Iron deficiency anemia, unspecified: Secondary | ICD-10-CM | POA: Diagnosis not present

## 2024-06-05 DIAGNOSIS — Z5112 Encounter for antineoplastic immunotherapy: Secondary | ICD-10-CM | POA: Diagnosis not present

## 2024-06-05 DIAGNOSIS — E119 Type 2 diabetes mellitus without complications: Secondary | ICD-10-CM | POA: Diagnosis not present

## 2024-06-05 DIAGNOSIS — I129 Hypertensive chronic kidney disease with stage 1 through stage 4 chronic kidney disease, or unspecified chronic kidney disease: Secondary | ICD-10-CM | POA: Diagnosis not present

## 2024-06-05 DIAGNOSIS — R609 Edema, unspecified: Secondary | ICD-10-CM | POA: Diagnosis not present

## 2024-06-05 DIAGNOSIS — K1379 Other lesions of oral mucosa: Secondary | ICD-10-CM | POA: Diagnosis not present

## 2024-06-05 MED ORDER — SODIUM CHLORIDE 0.9 % IV SOLN
Freq: Once | INTRAVENOUS | Status: AC
Start: 2024-06-05 — End: 2024-06-05

## 2024-06-05 MED ORDER — EVEROLIMUS 10 MG PO TABS
10.0000 mg | ORAL_TABLET | Freq: Every day | ORAL | 5 refills | Status: DC
  Filled 2024-06-05 – 2024-06-16 (×2): qty 28, 28d supply, fill #0
  Filled 2024-07-07: qty 28, 28d supply, fill #1
  Filled 2024-08-06: qty 28, 28d supply, fill #2
  Filled 2024-09-02: qty 28, 28d supply, fill #3

## 2024-06-05 NOTE — Telephone Encounter (Signed)
 Oral Oncology Patient Advocate Encounter   Received notification that prior authorization for Everolimus   is required.   PA submitted on 06/05/24 Key  [id number] Status is pending      Charlott Hamilton,  CPhT-Adv  she/her/hers [facility]  [facility] Pharmacy Technician Patient Advocate Specialist III [facility] Phone: [phone]  Fax: [phone] [email or web address]

## 2024-06-05 NOTE — Patient Instructions (Signed)

## 2024-06-05 NOTE — Telephone Encounter (Signed)
 Oral Oncology Pharmacist Encounter  Received new prescription for Afinitor  (everolimus ) for the treatment of renal cell carcnioma with progressing CNS metastases in conjunction with bevacizumab, planned duration until disease progression or unacceptable toxicity.  Labs from 05/22/2024 assessed, no interventions needed. Medication is not dose reduced for renal function. Message sent to MD regarding additional labs needs prior to starting therapy. Prescription dose and frequency assessed for appropriateness.   Supportive care:  - MD has already sent prescription for dexamethasone  mouthwash.  Current medication list in Epic reviewed, DDIs with Everolimus  identified: - zestoretic : everolimus  may increase adverse effects of ACE inhibitors specificially risk of angioedema may be increased. Will discuss with MD and patient.  - Amlodipine : ccb may increase effects of everolimus  specificially angioedema. No actions needed but could be additive as discussed above.   Evaluated chart and no patient barriers to medication adherence noted.   Prescription has been e-scribed to the [facility] for benefits analysis and approval.  Oral Oncology Clinic will continue to follow for insurance authorization, copayment issues, initial counseling and start date.  Maniya Donovan, PharmD Hematology/Oncology Clinical Pharmacist [facility] [phone] 06/05/2024 9:35 AM

## 2024-06-05 NOTE — Progress Notes (Signed)
 Patient called after discussion with his wife after last visit.  Will start bevacizumab with everolimus . Per Christiana MH, et al. JINNY Clin Oncol. 2016 Nov 10;34(32):3846-3853. doi: 10.1200/JCO.2016.67.9084. The treatment consists of everolimus  10 mg oral once per day plus bevacizumab 10 mg/kg intravenously every 2 weeks.   Discontinue cabozantinib .

## 2024-06-05 NOTE — Telephone Encounter (Signed)
Scheduled appointment with the patient.

## 2024-06-06 ENCOUNTER — Other Ambulatory Visit: Payer: Self-pay

## 2024-06-06 ENCOUNTER — Other Ambulatory Visit (HOSPITAL_COMMUNITY): Payer: Self-pay

## 2024-06-06 ENCOUNTER — Other Ambulatory Visit: Payer: Self-pay | Admitting: *Deleted

## 2024-06-06 DIAGNOSIS — C641 Malignant neoplasm of right kidney, except renal pelvis: Secondary | ICD-10-CM

## 2024-06-06 NOTE — Telephone Encounter (Signed)
 Oral Oncology Patient Advocate Encounter  Prior Authorization for Everolimus  has been approved.    PA# [id number] Effective dates: 06/05/24 through 06/05/25  Patients co-pay is $0.00.     Charlott Hamilton,  CPhT-Adv  she/her/hers [facility]  [facility] Pharmacy Technician Patient Advocate Specialist III [facility] Phone: [phone]  Fax: [phone] [email or web address]

## 2024-06-06 NOTE — Telephone Encounter (Signed)
 06/05/2024 Connected with Javen E Coco [phone] (home) to determine if we received all of the [employer] paperwork.  No MD Signature required.  Advised we do not manage billing.  Offered patient accounting phone number or ask [employer] if they accept insurance EOB's. Gave you the one page received.  Have not yet received any bills.  Had been off chemotherapy but will start again in the next week or so. until 06/19/2024.  The office obtained everything and radiation billing the previous submission.   Submitting treatment plan with dates of planned treatments, Brain MRI.  [facility] will let you know if they need anything further.  Envelope with your copy will be with registration for pick up on next scheduled appointment.   06/06/2024 completed process.  Copy to [facility] H.I.M bin for items to be scheduled.  No records for (SW) H.I.M.

## 2024-06-10 ENCOUNTER — Other Ambulatory Visit: Payer: Self-pay

## 2024-06-11 ENCOUNTER — Ambulatory Visit (HOSPITAL_COMMUNITY)
Admission: RE | Admit: 2024-06-11 | Discharge: 2024-06-11 | Disposition: A | Discharge status: Home / Self Care | Source: Ambulatory Visit

## 2024-06-11 DIAGNOSIS — C641 Malignant neoplasm of right kidney, except renal pelvis: Secondary | ICD-10-CM | POA: Insufficient documentation

## 2024-06-11 MED ORDER — IOHEXOL 300 MG/ML  SOLN
80.0000 mL | Freq: Once | INTRAMUSCULAR | Status: AC | PRN
  Administered 2024-06-11: 80 mL via INTRAVENOUS

## 2024-06-12 ENCOUNTER — Inpatient Hospital Stay

## 2024-06-12 DIAGNOSIS — C649 Malignant neoplasm of unspecified kidney, except renal pelvis: Secondary | ICD-10-CM

## 2024-06-12 DIAGNOSIS — C641 Malignant neoplasm of right kidney, except renal pelvis: Secondary | ICD-10-CM

## 2024-06-12 DIAGNOSIS — Z5112 Encounter for antineoplastic immunotherapy: Secondary | ICD-10-CM | POA: Diagnosis not present

## 2024-06-12 DIAGNOSIS — E860 Dehydration: Secondary | ICD-10-CM | POA: Diagnosis not present

## 2024-06-12 DIAGNOSIS — C7931 Secondary malignant neoplasm of brain: Secondary | ICD-10-CM | POA: Diagnosis not present

## 2024-06-12 DIAGNOSIS — K1379 Other lesions of oral mucosa: Secondary | ICD-10-CM | POA: Diagnosis not present

## 2024-06-12 DIAGNOSIS — R918 Other nonspecific abnormal finding of lung field: Secondary | ICD-10-CM | POA: Diagnosis not present

## 2024-06-12 DIAGNOSIS — E119 Type 2 diabetes mellitus without complications: Secondary | ICD-10-CM | POA: Diagnosis not present

## 2024-06-12 DIAGNOSIS — D509 Iron deficiency anemia, unspecified: Secondary | ICD-10-CM | POA: Diagnosis not present

## 2024-06-12 DIAGNOSIS — I129 Hypertensive chronic kidney disease with stage 1 through stage 4 chronic kidney disease, or unspecified chronic kidney disease: Secondary | ICD-10-CM | POA: Diagnosis not present

## 2024-06-12 DIAGNOSIS — N1831 Chronic kidney disease, stage 3a: Secondary | ICD-10-CM | POA: Diagnosis not present

## 2024-06-12 DIAGNOSIS — R490 Dysphonia: Secondary | ICD-10-CM | POA: Diagnosis not present

## 2024-06-12 DIAGNOSIS — Z905 Acquired absence of kidney: Secondary | ICD-10-CM | POA: Diagnosis not present

## 2024-06-12 DIAGNOSIS — K838 Other specified diseases of biliary tract: Secondary | ICD-10-CM | POA: Diagnosis not present

## 2024-06-12 DIAGNOSIS — R609 Edema, unspecified: Secondary | ICD-10-CM | POA: Diagnosis not present

## 2024-06-12 DIAGNOSIS — R21 Rash and other nonspecific skin eruption: Secondary | ICD-10-CM | POA: Diagnosis not present

## 2024-06-12 LAB — CBC WITH DIFFERENTIAL (CANCER CENTER ONLY)
Abs Immature Granulocytes: 0.02 K/uL (ref 0.00–0.07)
Basophils Absolute: 0 K/uL (ref 0.0–0.1)
Basophils Relative: 1 %
Eosinophils Absolute: 0 K/uL (ref 0.0–0.5)
Eosinophils Relative: 1 %
HCT: 38.6 % — ABNORMAL LOW (ref 39.0–52.0)
Hemoglobin: 13 g/dL (ref 13.0–17.0)
Immature Granulocytes: 1 %
Lymphocytes Relative: 32 %
Lymphs Abs: 1.2 K/uL (ref 0.7–4.0)
MCH: 28 pg (ref 26.0–34.0)
MCHC: 33.7 g/dL (ref 30.0–36.0)
MCV: 83.2 fL (ref 80.0–100.0)
Monocytes Absolute: 0.3 K/uL (ref 0.1–1.0)
Monocytes Relative: 9 %
Neutro Abs: 2 K/uL (ref 1.7–7.7)
Neutrophils Relative %: 56 %
Platelet Count: 275 K/uL (ref 150–400)
RBC: 4.64 MIL/uL (ref 4.22–5.81)
RDW: 17.8 % — ABNORMAL HIGH (ref 11.5–15.5)
WBC Count: 3.6 K/uL — ABNORMAL LOW (ref 4.0–10.5)
nRBC: 0 % (ref 0.0–0.2)

## 2024-06-12 LAB — CMP (CANCER CENTER ONLY)
ALT: 36 U/L (ref 0–44)
AST: 26 U/L (ref 15–41)
Albumin: 4.1 g/dL (ref 3.5–5.0)
Alkaline Phosphatase: 87 U/L (ref 38–126)
Anion gap: 4 — ABNORMAL LOW (ref 5–15)
BUN: 15 mg/dL (ref 8–23)
CO2: 29 mmol/L (ref 22–32)
Calcium: 9.6 mg/dL (ref 8.9–10.3)
Chloride: 102 mmol/L (ref 98–111)
Creatinine: 1.37 mg/dL — ABNORMAL HIGH (ref 0.61–1.24)
GFR, Estimated: 53 mL/min — ABNORMAL LOW (ref >60)
Glucose, Bld: 99 mg/dL (ref 70–99)
Potassium: 4.9 mmol/L (ref 3.5–5.1)
Sodium: 135 mmol/L (ref 135–145)
Total Bilirubin: 0.4 mg/dL (ref 0.0–1.2)
Total Protein: 7.5 g/dL (ref 6.5–8.1)

## 2024-06-12 LAB — LACTATE DEHYDROGENASE: LDH: 176 U/L (ref 98–192)

## 2024-06-12 LAB — HEMOGLOBIN A1C
Hgb A1c MFr Bld: 7.3 % — ABNORMAL HIGH (ref 4.8–5.6)
Mean Plasma Glucose: 162.81 mg/dL

## 2024-06-12 LAB — TOTAL PROTEIN, URINE DIPSTICK: Protein, ur: 30 mg/dL — AB

## 2024-06-12 NOTE — Progress Notes (Unsigned)
 [name]  Patient Care Team: Seabron Lenis, MD as PCP - General (Family Medicine)  [name] is a 78 y.o. 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 Currently with stage IV RCC. Staging imaging showed brain metastases, and pulmonary nodules suggestive of lung metastases.   Now with progressing CNS metastases. Stable systemically without progression. Wife and him decided not to pursue brain radiation. Will try systemic treatment. 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   Current diagnosis: stage IV RCC with brain metastases Initial diagnosis: 10/2022. pT3cN0M0 G3 right papillary RCC, type 2 Previous Treatment: 10/27/22 Open radical nephrectomy with IVC repair, cardiac bypass for level 3 thrombus at [facility] testing: negative for actionable mutation. Renal function: 10/09/23 Cr. 1.53. Labs: Ca 10.2. WBC 6.7. HGB 13.4 PLT 221. ANC 4.4   10/2023 first-line treatment cycle 1 day 1 lenvatinib  with pembrolizumab .  04/17/24 2nd line cabozantinib . Difficulty tolerating. Fatigue, loss of appetite, mouth pain, elected to discontinue. Stable disease on interval MRI of brain in July.  Will start bev/everolimus .   Assessment & Plan   No orders of the defined types were placed in this encounter.    Garrett [name], MD  INTERVAL HISTORY: Patient returns for follow-up.  Oncology History  Malignant neoplasm of prostate (HCC)  04/01/2018 Initial Diagnosis   Malignant neoplasm of prostate (HCC)   04/30/2018 Genetic Testing   Multi-Cancer panel (83 genes) @ [facility] - No pathogenic mutations detected Variants of Uncertain Significance in ATM, SDHA and TSC2  Genes Analyzed: 83 genes on [facility]'s Multi-Cancer panel (ALK, APC, ATM, AXIN2, BAP1, BARD1, BLM, BMPR1A, BRCA1, BRCA2, BRIP1,  CASR, CDC73, CDH1, CDK4, CDKN1B, CDKN1C, CDKN2A, CEBPA, CHEK2, CTNNA1, DICER1, DIS3L2, EGFR, EPCAM, FH, FLCN, GATA2, GPC3, GREM1, HOXB13, HRAS, KIT, MAX, MEN1, MET, MITF, MLH1, MSH2, MSH3, MSH6, MUTYH, NBN, NF1, NF2, NTHL1, PALB2, PDGFRA, PHOX2B, PMS2, POLD1, POLE, POT1, PRKAR1A, PTCH1, PTEN, RAD50, RAD51C, RAD51D, RB1, RECQL4, RET, RUNX1, SDHA, SDHAF2, SDHB, SDHC, SDHD, SMAD4, SMARCA4, SMARCB1, SMARCE1, STK11, SUFU, TERC, TERT, TMEM127, TP53, TSC1, TSC2, VHL, WRN, WT1).  UPDATE: ATM [id number] VUS was amended to Likely Benign due to a re-review of the evidence in light of new variant interpretation guidelines and/or new information.   Renal cell carcinoma, right (HCC)  09/29/2022 Imaging   CT chest 1.  No evidence of metastatic disease in the thorax.  2.  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 Consider correlation with genetic testing.  3.  Reference same day abdominal MRI for details regarding the right renal mass and associated tumor thrombus in the right renal vein and IVC    09/29/2022 Imaging   MRI 1.  Right interpolar renal 3.5 cm mass concerning for primary renal malignancy. Upper pole and lower pole tumor is favored to primarily be intravenous tumor thrombus, with intravenous tumor expanding the right renal vein, and the subdiaphragmatic IVC as detailed above. Differential includes renal cell carcinoma (favored) and transitional cell carcinoma.  2.  Please see outside 08/25/2022 CT abdomen and pelvis for evaluation of the collecting system on delayed/urographic phase.  3.  No definite lymphadenopathy.  4.  Signal changes in the L5 superior endplate are favored to reflect a fracture. However, recommend continued attention on follow-up.    10/27/2022 Pathology Results  SPECIMEN    Procedure:    Radical nephrectomy     Specimen Laterality:    Right   TUMOR    Tumor Focality:    Unifocal     Tumor Size:     Greatest Dimension (Centimeters): 8.2 cm     Histologic Type:    Papillary renal cell carcinoma, type 2     Histologic Grade (WHO / ISUP):    G3 (nucleoli conspicuous and eosinophilic at 100x magnification)     Tumor Extent:    Extends into perinephric tissue (beyond renal capsule)     Tumor Extent:    Extends into renal sinus     Tumor Extent:    Extends into major vein (renal vein or its segmental branches, inferior vena cava)     Sarcomatoid Features:    Not identified     Rhabdoid Features:    Not identified     Tumor Necrosis:    Not identified     Lymphovascular Invasion:    Present   MARGINS    Margin Status:    Invasive carcinoma present at margin       Margin(s) Involved by Invasive Carcinoma:    Renal vein: present in renal vein nephrectomy margin; No tumor seen is separately submitted renal vein segment.   REGIONAL LYMPH NODES     Regional Lymph Node Status:    Not applicable (no regional lymph nodes submitted or found)   PATHOLOGIC STAGE CLASSIFICATION (pTNM, AJCC 8th Edition)     Reporting of pT, pN, and (when applicable) pM categories is based on information available to the pathologist at the time the report is issued. As per the AJCC (Chapter 1, 8th Ed.) it is the managing physician's responsibility to establish the final  pathologic stage based upon all pertinent information, including but potentially not limited to this pathology report.     Primary Tumor (pT):    pT3b     Regional Lymph Nodes (pN):    pN not assigned (no nodes submitted or found)    03/08/2023 Imaging   CT CAP Status post radical right nephrectomy, IVC repair, and partial right adrenalectomy without evidence of recurrent or metastatic disease.  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  L5 compression deformity without progressive interval height loss  Lungs/Pleura: No large airspace consolidation or pleural effusion. Similar  distribution of numerous thin-walled pulmonary cysts throughout the bilateral lungs. Bibasilar atelectasis/scarring. No new pulmonary nodules.    09/13/2023 Imaging   CT CAP 5 mm left lower lobe pulmonary nodule Solid upper lobe pulmonary nodule 3 mm 4 mm right lower lobe nodule Sclerotic focus T1 change from prior likely bone island.  No aggressive lytic lesion.  Multilevel degenerative changes of spine and bilateral shoulder Similar appearance of sclerotic superior endplate deformity at L5 unchanged dating back to September 2023.  New from April 2019. 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 No pathologically enlarged abdominal or pelvic lymph nodes.   10/08/2023 Initial Diagnosis   Renal cell carcinoma, right (HCC)   11/08/2023 - 04/03/2024 Chemotherapy   Patient is on Treatment Plan : BLADDER Pembrolizumab  (200) q21d     04/09/2024 Imaging   MRI brain: At least 30  enhancing metastases are identified within the supratentorial and infratentorial brain.   04/17/2024 Imaging   CT CAP 1. Status post right nephrectomy and adrenalectomy. 2. Unchanged appearance of soft tissue nodule in the nephrectomy bed overlying the most superior portion of the right psoas, measuring 1.7 x 0.9 cm which remains highly suspicious for locally recurrent malignancy. 3. Multiple small bilateral pulmonary nodules, not significantly changed, measuring up to 0.6 cm, nonspecific. Continued attention on follow-up. 4. No other evidence of lymphadenopathy or metastatic disease in the chest, abdomen, or pelvis. 5. Similar intra and extrahepatic biliary ductal dilatation, common bile duct dilatation measuring up to 1.1 cm in caliber with associated periportal edema.  No calculus or other obstruction visible to the ampulla. 6. Unchanged cystic lesion of the dorsal pancreatic body measuring 1.3 x 1.3 cm. No pancreatic ductal dilatation or surrounding inflammatory changes. 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 There were no vitals filed for this visit.  Relevant data reviewed during this visit included ***

## 2024-06-12 NOTE — Assessment & Plan Note (Signed)
 More than 30 lesions on MRI and not a SRS candidate. Patient and wife declined RT to the brain Stable disease on cabo but lost QoL and side effects. Switching to bev/everolimus  Repeat MR in about 2 months

## 2024-06-13 ENCOUNTER — Inpatient Hospital Stay

## 2024-06-13 ENCOUNTER — Inpatient Hospital Stay: Admitting: Dietician

## 2024-06-13 ENCOUNTER — Inpatient Hospital Stay (HOSPITAL_BASED_OUTPATIENT_CLINIC_OR_DEPARTMENT_OTHER)

## 2024-06-13 VITALS — BP 88/51 | HR 90 | Temp 97.3°F | Resp 17 | Ht 74.0 in | Wt 193.0 lb

## 2024-06-13 DIAGNOSIS — C649 Malignant neoplasm of unspecified kidney, except renal pelvis: Secondary | ICD-10-CM

## 2024-06-13 DIAGNOSIS — C641 Malignant neoplasm of right kidney, except renal pelvis: Secondary | ICD-10-CM | POA: Diagnosis not present

## 2024-06-13 DIAGNOSIS — E860 Dehydration: Secondary | ICD-10-CM | POA: Diagnosis not present

## 2024-06-13 DIAGNOSIS — C7931 Secondary malignant neoplasm of brain: Secondary | ICD-10-CM | POA: Diagnosis not present

## 2024-06-13 DIAGNOSIS — Z5112 Encounter for antineoplastic immunotherapy: Secondary | ICD-10-CM | POA: Diagnosis not present

## 2024-06-13 DIAGNOSIS — Z9189 Other specified personal risk factors, not elsewhere classified: Secondary | ICD-10-CM

## 2024-06-13 DIAGNOSIS — R490 Dysphonia: Secondary | ICD-10-CM | POA: Diagnosis not present

## 2024-06-13 DIAGNOSIS — Z905 Acquired absence of kidney: Secondary | ICD-10-CM | POA: Diagnosis not present

## 2024-06-13 DIAGNOSIS — R609 Edema, unspecified: Secondary | ICD-10-CM | POA: Diagnosis not present

## 2024-06-13 DIAGNOSIS — D509 Iron deficiency anemia, unspecified: Secondary | ICD-10-CM | POA: Diagnosis not present

## 2024-06-13 DIAGNOSIS — I129 Hypertensive chronic kidney disease with stage 1 through stage 4 chronic kidney disease, or unspecified chronic kidney disease: Secondary | ICD-10-CM | POA: Diagnosis not present

## 2024-06-13 DIAGNOSIS — R918 Other nonspecific abnormal finding of lung field: Secondary | ICD-10-CM | POA: Diagnosis not present

## 2024-06-13 DIAGNOSIS — N1831 Chronic kidney disease, stage 3a: Secondary | ICD-10-CM | POA: Diagnosis not present

## 2024-06-13 DIAGNOSIS — R21 Rash and other nonspecific skin eruption: Secondary | ICD-10-CM | POA: Diagnosis not present

## 2024-06-13 DIAGNOSIS — K1379 Other lesions of oral mucosa: Secondary | ICD-10-CM | POA: Diagnosis not present

## 2024-06-13 DIAGNOSIS — E119 Type 2 diabetes mellitus without complications: Secondary | ICD-10-CM | POA: Diagnosis not present

## 2024-06-13 DIAGNOSIS — K838 Other specified diseases of biliary tract: Secondary | ICD-10-CM | POA: Diagnosis not present

## 2024-06-13 MED ORDER — DEXAMETHASONE 0.5 MG/5ML PO SOLN: ORAL | 4 refills | Status: DC

## 2024-06-13 MED ORDER — SODIUM CHLORIDE 0.9 % IV SOLN: Freq: Once | INTRAVENOUS | Status: AC

## 2024-06-13 NOTE — Assessment & Plan Note (Addendum)
 Fluid goal 64 oz per day Monitor for hypertension with baseline hypertension Check lab with LFT, renal function, electrolytes, every 2 weeks  Dental exam periodically Monitor for signs of bleeding, wound healing, thrombosis Mouth rinse with dexamethasone  and baking soda 4 times a day.

## 2024-06-13 NOTE — Progress Notes (Signed)
 Nutrition Follow-up:  Patient with renal cell carcinoma metastatic to brain. Second line cabozantinib  discontinued due to poor tolerance. Planning third line everolimus /bevacizumab 7/24. Patient is under the care of Dr. Tina.  Met with patient in infusion. Patient receiving IVF today. He is snacking on nabs at visit. Had pizza before coming to [facility]. Patient reports appetite has been great the last couple weeks. He stopped second line chemo 3 weeks ago. Patient glad he is able to enjoy foods again. He is hoping new treatment will not affect appetite and taste.   Medications: reviewed   Labs: Cr 1.37  Anthropometrics: Wt 193 lb today - increased   7/7 - 187 lb 6/26 - 189 lb 9.6 oz    NUTRITION DIAGNOSIS: Unintended wt loss - improved   INTERVENTION:  Encourage high calorie high protein foods for wt maintenance/gain Support and encouragement    MONITORING, EVALUATION, GOAL: wt trends, intake   NEXT VISIT: Friday August 8 during infusion

## 2024-06-13 NOTE — Progress Notes (Signed)
 Per Dr Tina, ok to run fluids at 999 today

## 2024-06-13 NOTE — Assessment & Plan Note (Addendum)
 continue fluid this week. Increase oral fluids at home, 60 to 70 ounces a day. May continue as needed.

## 2024-06-13 NOTE — Assessment & Plan Note (Signed)
 Will start saline everolimus  with bevacizumab Follow-up new staging study next week for final read

## 2024-06-14 ENCOUNTER — Other Ambulatory Visit: Payer: Self-pay

## 2024-06-16 ENCOUNTER — Other Ambulatory Visit: Payer: Self-pay

## 2024-06-16 NOTE — Progress Notes (Signed)
 Specialty Pharmacy Initial Fill Coordination Note  [name] is a 78 y.o. male contacted today regarding refills of specialty medication(s) Everolimus  (AFINITOR ) .  Patient requested Delivery  on 06/18/24  to verified address [address] [place] [address]   Medication will be filled on 06/16/24.   Patient is aware of $0.00 copayment.

## 2024-06-16 NOTE — Progress Notes (Signed)
 Patient counseled on Afinitor  in telephone encounter opened on 06/05/24.  [name], PharmD Hematology/Oncology Clinical Pharmacist [facility] [phone]-

## 2024-06-16 NOTE — Telephone Encounter (Addendum)
 Oral Chemotherapy Pharmacist Encounter  I spoke with patient for overview of: Afinitor  (everolimus ) for the treatment of metastatic renal cell carcinoma in combination with bevacizumab , planned duration until disease progression or unacceptable toxicity.   Counseled patient on administration, dosing, side effects, monitoring, drug-food interactions, safe handling, storage, and disposal.  Patient will take Afinitor  10mg  tablets, 1 tablet by mouth once daily, with water, without regard to food.  Patient understands to take Afinitor  consistently with regards to food and at approximately the same time each day.  Patient knows to aviod grapefruit or grapefruit juice while on therapy with Afinitor .  Afinitor  and bevacizumab  start date: 06/19/2024  Adverse effects include but are not limited to: mouth sores, GI upset, nausea, diarrhea, constipation, rash, increased blood sugars, decreased blood counts, increased blood pressure, pulmonary toxicity, and edema.   Dexamethasone  mouthwash for the prevention of stomatitis has been e-scribed to local pharmacy.  We discussed appropriate use of mouthwash and duration of stomatitis prevention. Patient is aware and states he understands how to administer.  Reviewed with patient importance of keeping a medication schedule and plan for any missed doses. No barriers to medication adherence identified.  Medication reconciliation performed and medication/allergy list updated.  Distress thermometer not completed during telephone call as patient has been on previous lines of therapy.   All questions answered. Patient voiced understanding and appreciation. Medication education handout placed in mail for patient. Patient knows to call the office with questions or concerns. Oral Chemotherapy Clinic phone number provided to patient.   Deatrice Spanbauer, PharmD Hematology/Oncology Clinical Pharmacist [facility] Oral Chemotherapy Navigation  Clinic [phone] 06/16/2024 1:36 PM

## 2024-06-17 ENCOUNTER — Ambulatory Visit: Payer: Self-pay

## 2024-06-18 ENCOUNTER — Other Ambulatory Visit: Payer: Self-pay

## 2024-06-19 ENCOUNTER — Inpatient Hospital Stay

## 2024-06-19 ENCOUNTER — Ambulatory Visit: Payer: Self-pay

## 2024-06-19 VITALS — BP 136/78 | HR 58 | Temp 98.0°F | Resp 16

## 2024-06-19 DIAGNOSIS — R490 Dysphonia: Secondary | ICD-10-CM | POA: Diagnosis not present

## 2024-06-19 DIAGNOSIS — Z905 Acquired absence of kidney: Secondary | ICD-10-CM | POA: Diagnosis not present

## 2024-06-19 DIAGNOSIS — C7931 Secondary malignant neoplasm of brain: Secondary | ICD-10-CM | POA: Diagnosis not present

## 2024-06-19 DIAGNOSIS — E860 Dehydration: Secondary | ICD-10-CM | POA: Diagnosis not present

## 2024-06-19 DIAGNOSIS — C649 Malignant neoplasm of unspecified kidney, except renal pelvis: Secondary | ICD-10-CM

## 2024-06-19 DIAGNOSIS — Z5112 Encounter for antineoplastic immunotherapy: Secondary | ICD-10-CM | POA: Diagnosis not present

## 2024-06-19 DIAGNOSIS — E119 Type 2 diabetes mellitus without complications: Secondary | ICD-10-CM | POA: Diagnosis not present

## 2024-06-19 DIAGNOSIS — R609 Edema, unspecified: Secondary | ICD-10-CM | POA: Diagnosis not present

## 2024-06-19 DIAGNOSIS — K838 Other specified diseases of biliary tract: Secondary | ICD-10-CM | POA: Diagnosis not present

## 2024-06-19 DIAGNOSIS — C641 Malignant neoplasm of right kidney, except renal pelvis: Secondary | ICD-10-CM | POA: Diagnosis not present

## 2024-06-19 DIAGNOSIS — N1831 Chronic kidney disease, stage 3a: Secondary | ICD-10-CM | POA: Diagnosis not present

## 2024-06-19 DIAGNOSIS — K1379 Other lesions of oral mucosa: Secondary | ICD-10-CM | POA: Diagnosis not present

## 2024-06-19 DIAGNOSIS — I129 Hypertensive chronic kidney disease with stage 1 through stage 4 chronic kidney disease, or unspecified chronic kidney disease: Secondary | ICD-10-CM | POA: Diagnosis not present

## 2024-06-19 DIAGNOSIS — R918 Other nonspecific abnormal finding of lung field: Secondary | ICD-10-CM | POA: Diagnosis not present

## 2024-06-19 DIAGNOSIS — D509 Iron deficiency anemia, unspecified: Secondary | ICD-10-CM | POA: Diagnosis not present

## 2024-06-19 DIAGNOSIS — R21 Rash and other nonspecific skin eruption: Secondary | ICD-10-CM | POA: Diagnosis not present

## 2024-06-19 LAB — CBC WITH DIFFERENTIAL (CANCER CENTER ONLY)
Abs Immature Granulocytes: 0.09 K/uL — ABNORMAL HIGH (ref 0.00–0.07)
Basophils Absolute: 0 K/uL (ref 0.0–0.1)
Basophils Relative: 0 %
Eosinophils Absolute: 0.1 K/uL (ref 0.0–0.5)
Eosinophils Relative: 1 %
HCT: 36 % — ABNORMAL LOW (ref 39.0–52.0)
Hemoglobin: 11.7 g/dL — ABNORMAL LOW (ref 13.0–17.0)
Immature Granulocytes: 2 %
Lymphocytes Relative: 23 %
Lymphs Abs: 1.3 K/uL (ref 0.7–4.0)
MCH: 28.2 pg (ref 26.0–34.0)
MCHC: 32.5 g/dL (ref 30.0–36.0)
MCV: 86.7 fL (ref 80.0–100.0)
Monocytes Absolute: 0.5 K/uL (ref 0.1–1.0)
Monocytes Relative: 9 %
Neutro Abs: 3.5 K/uL (ref 1.7–7.7)
Neutrophils Relative %: 65 %
Platelet Count: 240 K/uL (ref 150–400)
RBC: 4.15 MIL/uL — ABNORMAL LOW (ref 4.22–5.81)
RDW: 18.9 % — ABNORMAL HIGH (ref 11.5–15.5)
WBC Count: 5.5 K/uL (ref 4.0–10.5)
nRBC: 0 % (ref 0.0–0.2)

## 2024-06-19 LAB — CMP (CANCER CENTER ONLY)
ALT: 33 U/L (ref 0–44)
AST: 25 U/L (ref 15–41)
Albumin: 4 g/dL (ref 3.5–5.0)
Alkaline Phosphatase: 75 U/L (ref 38–126)
Anion gap: 6 (ref 5–15)
BUN: 15 mg/dL (ref 8–23)
CO2: 27 mmol/L (ref 22–32)
Calcium: 9.4 mg/dL (ref 8.9–10.3)
Chloride: 104 mmol/L (ref 98–111)
Creatinine: 1.28 mg/dL — ABNORMAL HIGH (ref 0.61–1.24)
GFR, Estimated: 58 mL/min — ABNORMAL LOW (ref >60)
Glucose, Bld: 128 mg/dL — ABNORMAL HIGH (ref 70–99)
Potassium: 3.9 mmol/L (ref 3.5–5.1)
Sodium: 137 mmol/L (ref 135–145)
Total Bilirubin: 0.3 mg/dL (ref 0.0–1.2)
Total Protein: 7.3 g/dL (ref 6.5–8.1)

## 2024-06-19 LAB — T4, FREE: Free T4: 0.62 ng/dL (ref 0.61–1.12)

## 2024-06-19 LAB — TOTAL PROTEIN, URINE DIPSTICK: Protein, ur: NEGATIVE mg/dL

## 2024-06-19 MED ORDER — SODIUM CHLORIDE 0.9 % IV SOLN
10.0000 mg/kg | Freq: Once | INTRAVENOUS | Status: AC
  Administered 2024-06-19: 800 mg via INTRAVENOUS
  Filled 2024-06-19: qty 32

## 2024-06-19 MED ORDER — SODIUM CHLORIDE 0.9 % IV SOLN: INTRAVENOUS | Status: DC

## 2024-06-19 MED ORDER — SODIUM CHLORIDE 0.9 % IV SOLN
Freq: Once | INTRAVENOUS | Status: AC
Start: 2024-06-19 — End: 2024-06-19

## 2024-06-19 NOTE — Progress Notes (Signed)
 Patient requested that fluids be run over an hour instead of two hours- Dr. Tina stated that was appropriate and to proceed.  Confirmed with Dr. Tina for the patient- patient to start his oral chemotherapy today.

## 2024-06-19 NOTE — Patient Instructions (Signed)
 [facility] - A DEPT OF [facility]  Discharge Instructions: Thank you for choosing [facility] to provide your oncology and hematology care.   If you have a lab appointment with the Cancer Center, please go directly to the Cancer Center and check in at the registration area.   Wear comfortable clothing and clothing appropriate for easy access to any Portacath or PICC line.   We strive to give you quality time with your provider. You may need to reschedule your appointment if you arrive late (15 or more minutes).  Arriving late affects you and other patients whose appointments are after yours.  Also, if you miss three or more appointments without notifying the office, you may be dismissed from the clinic at the provider's discretion.      For prescription refill requests, have your pharmacy contact our office and allow 72 hours for refills to be completed.    Today you received the following chemotherapy and/or immunotherapy agents: Bevacizumab (MVASI)   To help prevent nausea and vomiting after your treatment, we encourage you to take your nausea medication as directed.  BELOW ARE SYMPTOMS THAT SHOULD BE REPORTED IMMEDIATELY: *FEVER GREATER THAN 100.4 F (38 C) OR HIGHER *CHILLS OR SWEATING *NAUSEA AND VOMITING THAT IS NOT CONTROLLED WITH YOUR NAUSEA MEDICATION *UNUSUAL SHORTNESS OF BREATH *UNUSUAL BRUISING OR BLEEDING *URINARY PROBLEMS (pain or burning when urinating, or frequent urination) *BOWEL PROBLEMS (unusual diarrhea, constipation, pain near the anus) TENDERNESS IN MOUTH AND THROAT WITH OR WITHOUT PRESENCE OF ULCERS (sore throat, sores in mouth, or a toothache) UNUSUAL RASH, SWELLING OR PAIN  UNUSUAL VAGINAL DISCHARGE OR ITCHING   Items with * indicate a potential emergency and should be followed up as soon as possible or go to the Emergency Department if any problems should occur.  Please show the CHEMOTHERAPY ALERT CARD or  IMMUNOTHERAPY ALERT CARD at check-in to the Emergency Department and triage nurse.  Should you have questions after your visit or need to cancel or reschedule your appointment, please contact [facility] - A DEPT OF [facility]  Dept: [phone]  and follow the prompts.  Office hours are 8:00 a.m. to 4:30 p.m. Monday - Friday. Please note that voicemails left after 4:00 p.m. may not be returned until the following business day.  We are closed weekends and major holidays. You have access to a nurse at all times for urgent questions. Please call the main number to the clinic Dept: [phone] and follow the prompts.   For any non-urgent questions, you may also contact your provider using MyChart. We now offer e-Visits for anyone 71 and older to request care online for non-urgent symptoms. For details visit [email or web address].   Also download the MyChart app! Go to the app store, search "MyChart", open the app, select [place], and log in with your MyChart username and password.  Bevacizumab Injection What is this medication? BEVACIZUMAB (be va SIZ yoo mab) treats some types of cancer. It works by blocking a protein that causes cancer cells to grow and multiply. This helps to slow or stop the spread of cancer cells. It is a monoclonal antibody. This medicine may be used for other purposes; ask your health care provider or pharmacist if you have questions. COMMON BRAND NAME(S): Alymsys, Avastin, MVASI, Vegzalma, Zirabev What should I tell my care team before I take this medication? They need to know if you have any  of these conditions: Blood clots Coughing up blood Having or recent surgery Heart failure High blood pressure History of a connection between 2 or more body parts that do not usually connect (fistula) History of a tear in your stomach or intestines Protein in your urine An unusual or allergic reaction to bevacizumab, other medications, foods,  dyes, or preservatives Pregnant or trying to get pregnant Breast-feeding How should I use this medication? This medication is injected into a vein. It is given by your care team in a hospital or clinic setting. Talk to your care team the use of this medication in children. Special care may be needed. Overdosage: If you think you have taken too much of this medicine contact a poison control center or emergency room at once. NOTE: This medicine is only for you. Do not share this medicine with others. What if I miss a dose? Keep appointments for follow-up doses. It is important not to miss your dose. Call your care team if you are unable to keep an appointment. What may interact with this medication? Interactions are not expected. This list may not describe all possible interactions. Give your health care provider a list of all the medicines, herbs, non-prescription drugs, or dietary supplements you use. Also tell them if you smoke, drink alcohol, or use illegal drugs. Some items may interact with your medicine. What should I watch for while using this medication? Your condition will be monitored carefully while you are receiving this medication. You may need blood work while taking this medication. This medication may make you feel generally unwell. This is not uncommon as chemotherapy can affect healthy cells as well as cancer cells. Report any side effects. Continue your course of treatment even though you feel ill unless your care team tells you to stop. This medication may increase your risk to bruise or bleed. Call your care team if you notice any unusual bleeding. Before having surgery, talk to your care team to make sure it is ok. This medication can increase the risk of poor healing of your surgical site or wound. You will need to stop this medication for 28 days before surgery. After surgery, wait at least 28 days before restarting this medication. Make sure the surgical site or wound is  healed enough before restarting this medication. Talk to your care team if questions. Talk to your care team if you may be pregnant. Serious birth defects can occur if you take this medication during pregnancy and for 6 months after the last dose. Contraception is recommended while taking this medication and for 6 months after the last dose. Your care team can help you find the option that works for you. Do not breastfeed while taking this medication and for 6 months after the last dose. This medication can cause infertility. Talk to your care team if you are concerned about your fertility. What side effects may I notice from receiving this medication? Side effects that you should report to your care team as soon as possible: Allergic reactions--skin rash, itching, hives, swelling of the face, lips, tongue, or throat Bleeding--bloody or black, tar-like stools, vomiting blood or brown material that looks like coffee grounds, red or dark brown urine, small red or purple spots on skin, unusual bruising or bleeding Blood clot--pain, swelling, or warmth in the leg, shortness of breath, chest pain Heart attack--pain or tightness in the chest, shoulders, arms, or jaw, nausea, shortness of breath, cold or clammy skin, feeling faint or lightheaded Heart failure--shortness of breath,  swelling of the ankles, feet, or hands, sudden weight gain, unusual weakness or fatigue Increase in blood pressure Infection--fever, chills, cough, sore throat, wounds that don't heal, pain or trouble when passing urine, general feeling of discomfort or being unwell Infusion reactions--chest pain, shortness of breath or trouble breathing, feeling faint or lightheaded Kidney injury--decrease in the amount of urine, swelling of the ankles, hands, or feet Stomach pain that is severe, does not go away, or gets worse Stroke--sudden numbness or weakness of the face, arm, or leg, trouble speaking, confusion, trouble walking, loss of  balance or coordination, dizziness, severe headache, change in vision Sudden and severe headache, confusion, change in vision, seizures, which may be signs of posterior reversible encephalopathy syndrome (PRES) Side effects that usually do not require medical attention (report to your care team if they continue or are bothersome): Back pain Change in taste Diarrhea Dry skin Increased tears Nosebleed This list may not describe all possible side effects. Call your doctor for medical advice about side effects. You may report side effects to FDA at 1-800-FDA-1088. Where should I keep my medication? This medication is given in a hospital or clinic. It will not be stored at home. NOTE: This sheet is a summary. It may not cover all possible information. If you have questions about this medicine, talk to your doctor, pharmacist, or health care provider.  2024 Elsevier/Gold Standard (2022-03-31 00:00:00)

## 2024-06-20 ENCOUNTER — Telehealth: Payer: Self-pay

## 2024-06-20 ENCOUNTER — Other Ambulatory Visit (HOSPITAL_BASED_OUTPATIENT_CLINIC_OR_DEPARTMENT_OTHER): Payer: Self-pay

## 2024-06-20 NOTE — Telephone Encounter (Signed)
 [name] states that he is doing fine. He is eating, drinking, and urinating well. He knows to call the office at [phone] if he has any questions or concerns.

## 2024-06-20 NOTE — Telephone Encounter (Signed)
-----   Message from Nurse Almarie A sent at 06/19/2024 12:13 PM EDT ----- Regarding: First time First time bevacizumab . Tolerated well. Dr. Fredrik patient. Also, starting everolimus  PO.

## 2024-06-21 ENCOUNTER — Other Ambulatory Visit: Payer: Self-pay

## 2024-06-25 ENCOUNTER — Other Ambulatory Visit (HOSPITAL_COMMUNITY)

## 2024-06-26 ENCOUNTER — Inpatient Hospital Stay (HOSPITAL_BASED_OUTPATIENT_CLINIC_OR_DEPARTMENT_OTHER)

## 2024-06-26 VITALS — BP 140/74 | HR 73 | Temp 97.9°F | Resp 20 | Wt 200.5 lb

## 2024-06-26 DIAGNOSIS — N1831 Chronic kidney disease, stage 3a: Secondary | ICD-10-CM | POA: Diagnosis not present

## 2024-06-26 DIAGNOSIS — C641 Malignant neoplasm of right kidney, except renal pelvis: Secondary | ICD-10-CM | POA: Diagnosis not present

## 2024-06-26 DIAGNOSIS — R21 Rash and other nonspecific skin eruption: Secondary | ICD-10-CM

## 2024-06-26 DIAGNOSIS — C7931 Secondary malignant neoplasm of brain: Secondary | ICD-10-CM | POA: Diagnosis not present

## 2024-06-26 DIAGNOSIS — K1379 Other lesions of oral mucosa: Secondary | ICD-10-CM

## 2024-06-26 DIAGNOSIS — R609 Edema, unspecified: Secondary | ICD-10-CM | POA: Diagnosis not present

## 2024-06-26 DIAGNOSIS — I129 Hypertensive chronic kidney disease with stage 1 through stage 4 chronic kidney disease, or unspecified chronic kidney disease: Secondary | ICD-10-CM | POA: Diagnosis not present

## 2024-06-26 DIAGNOSIS — Z905 Acquired absence of kidney: Secondary | ICD-10-CM | POA: Diagnosis not present

## 2024-06-26 DIAGNOSIS — Z5112 Encounter for antineoplastic immunotherapy: Secondary | ICD-10-CM | POA: Diagnosis not present

## 2024-06-26 DIAGNOSIS — E119 Type 2 diabetes mellitus without complications: Secondary | ICD-10-CM | POA: Diagnosis not present

## 2024-06-26 DIAGNOSIS — E860 Dehydration: Secondary | ICD-10-CM | POA: Diagnosis not present

## 2024-06-26 DIAGNOSIS — R918 Other nonspecific abnormal finding of lung field: Secondary | ICD-10-CM | POA: Diagnosis not present

## 2024-06-26 DIAGNOSIS — C649 Malignant neoplasm of unspecified kidney, except renal pelvis: Secondary | ICD-10-CM | POA: Diagnosis not present

## 2024-06-26 DIAGNOSIS — K838 Other specified diseases of biliary tract: Secondary | ICD-10-CM | POA: Diagnosis not present

## 2024-06-26 DIAGNOSIS — D509 Iron deficiency anemia, unspecified: Secondary | ICD-10-CM | POA: Diagnosis not present

## 2024-06-26 DIAGNOSIS — R490 Dysphonia: Secondary | ICD-10-CM | POA: Diagnosis not present

## 2024-06-26 MED ORDER — DEXAMETHASONE 2 MG PO TABS: ORAL_TABLET | ORAL | 0 refills | Status: DC

## 2024-06-26 MED ORDER — DEXAMETHASONE 1 MG PO TABS: ORAL_TABLET | ORAL | 0 refills | Status: DC

## 2024-06-26 MED ORDER — TRIAMCINOLONE ACETONIDE 0.5 % EX OINT
1.0000 | TOPICAL_OINTMENT | Freq: Two times a day (BID) | CUTANEOUS | 0 refills | Status: DC
Start: 2024-06-26 — End: 2024-07-17

## 2024-06-26 MED ORDER — DEXAMETHASONE 0.5 MG/5ML PO SOLN: 0.5000 mg | Freq: Four times a day (QID) | ORAL | 0 refills | Status: DC | PRN

## 2024-06-26 NOTE — Assessment & Plan Note (Addendum)
 Dexamethasone  8 mg once daily for 2 days starting today and then in the morning after food, then 4 mg once daily for 4 days, then 2 mg once day for a week, then 1 mg once daily. Refilled kenalog  0.5 % to use twice daily until resolved.

## 2024-06-26 NOTE — Progress Notes (Signed)
 [facility] OFFICE PROGRESS NOTE  Patient Care Team: Seabron Lenis, MD as PCP - General (Family Medicine)  [name] is a 78 y.o. male with history of DM, HTN, HLD, OSA, and prostate cancer (T1c GG2 GS 3+4) s/p EBRT alone in 2019 and Right papillary RCC s/p nephrectomy in 10/2022 referred to Medical Oncology Clinic for [facility]. Currently with stage IV RCC. Staging imaging showed brain metastases, and pulmonary nodules suggestive of lung metastases.   Current diagnosis: stage IV RCC with brain metastases Initial diagnosis: 10/2022. [id number] G3 right papillary RCC, type 2 Previous Treatment: 10/27/22 Open radical nephrectomy with IVC repair, cardiac bypass for level 3 thrombus at [facility] testing: negative for actionable mutation.  Systemic treatment: 10/2023 first-line treatment cycle 1 day 1 lenvatinib  with pembrolizumab .  04/17/24 2nd line cabozantinib . Difficulty tolerating. Fatigue, loss of appetite, mouth pain, elected to discontinue. Stable disease on interval MRI of brain in July. 06/19/24 3rd line bev/everolimus    He tolerated treatment better.  However, report of previous rash that was resolving that has worsened after starting new treatment.  Rash over the bilateral axilla, lower extremity and side.  Will start steroid with taper today.  Mouth pain is being controlled with dexamethasone  rinse.  Refill today.  Discussed that if headaches continue to improve, then likely signs of response.  In that case repeat imaging in about end of September. 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 Patient and wife declined RT to the brain Stable disease on cabo (05-05/2024) but lost QoL and side effects.  Elected to discontinue. Now on bev/everolimus  since 7/24 Repeat MR in about 2 months in end of September Mouth pain Refilled dex liquid mouth rinse Rash Dexamethasone  8 mg once daily for 2 days  starting today and then in the morning after food, then 4 mg once daily for 4 days, then 2 mg once day for a week, then 1 mg once daily. Refilled kenalog  0.5 % to use twice daily until resolved.  Follow up as scheduled.   Garrett [name], MD  INTERVAL HISTORY: Patient returns for follow-up. Report taste is still there. No mouth pain. Report rash and itching most of his body before the new medication. It got worse after started new medication.   Headaches taking about once a day resolved after Tylenol . No visual change or focal weakness.    Oncology History  Malignant neoplasm of prostate (HCC)  04/01/2018 Initial Diagnosis   Malignant neoplasm of prostate (HCC)   04/30/2018 Genetic Testing   Multi-Cancer panel (83 genes) @ [facility] - No pathogenic mutations detected Variants of Uncertain Significance in ATM, SDHA and TSC2  Genes Analyzed: 83 genes on [facility]'s Multi-Cancer panel (ALK, APC, ATM, AXIN2, BAP1, BARD1, BLM, BMPR1A, BRCA1, BRCA2, BRIP1, CASR, CDC73, CDH1, CDK4, CDKN1B, CDKN1C, CDKN2A, CEBPA, CHEK2, CTNNA1, DICER1, DIS3L2, EGFR, EPCAM, FH, FLCN, GATA2, GPC3, GREM1, HOXB13, HRAS, KIT, MAX, MEN1, MET, MITF, MLH1, MSH2, MSH3, MSH6, MUTYH, NBN, NF1, NF2, NTHL1, PALB2, PDGFRA, PHOX2B, PMS2, POLD1, POLE, POT1, PRKAR1A, PTCH1, PTEN, RAD50, RAD51C, RAD51D, RB1, RECQL4, RET, RUNX1, SDHA, SDHAF2, SDHB, SDHC, SDHD, SMAD4, SMARCA4, SMARCB1, SMARCE1, STK11, SUFU, TERC, TERT, TMEM127, TP53, TSC1, TSC2, VHL, WRN, WT1).  UPDATE: ATM [id number] VUS was amended to Likely Benign due to a re-review of the evidence in light of new variant interpretation guidelines and/or new information.   Renal cell carcinoma, right (HCC)  09/29/2022 Imaging   CT chest 1.  No evidence of metastatic disease in the thorax.  2.  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 Consider correlation with genetic testing.  3.  Reference same day  abdominal MRI for details regarding the right renal mass and associated tumor thrombus in the right renal vein and IVC    09/29/2022 Imaging   MRI 1.  Right interpolar renal 3.5 cm mass concerning for primary renal malignancy. Upper pole and lower pole tumor is favored to primarily be intravenous tumor thrombus, with intravenous tumor expanding the right renal vein, and the subdiaphragmatic IVC as detailed above. Differential includes renal cell carcinoma (favored) and transitional cell carcinoma.  2.  Please see outside 08/25/2022 CT abdomen and pelvis for evaluation of the collecting system on delayed/urographic phase.  3.  No definite lymphadenopathy.  4.  Signal changes in the L5 superior endplate are favored to reflect a fracture. However, recommend continued attention on follow-up.    10/27/2022 Pathology Results   SPECIMEN    Procedure:    Radical nephrectomy     Specimen Laterality:    Right   TUMOR    Tumor Focality:    Unifocal     Tumor Size:    Greatest Dimension (Centimeters): 8.2 cm     Histologic Type:    Papillary renal cell carcinoma, type 2     Histologic Grade (WHO / ISUP):    G3 (nucleoli conspicuous and eosinophilic at 100x magnification)     Tumor Extent:    Extends into perinephric tissue (beyond renal capsule)     Tumor Extent:    Extends into renal sinus     Tumor Extent:    Extends into major vein (renal vein or its segmental branches, inferior vena cava)     Sarcomatoid Features:    Not identified     Rhabdoid Features:    Not identified     Tumor Necrosis:    Not identified     Lymphovascular Invasion:    Present   MARGINS    Margin Status:    Invasive carcinoma present at margin       Margin(s) Involved by Invasive Carcinoma:    Renal vein: present in renal vein nephrectomy margin; No tumor seen is separately submitted renal vein segment.   REGIONAL LYMPH NODES     Regional Lymph Node Status:    Not applicable (no regional lymph nodes submitted or found)    PATHOLOGIC STAGE CLASSIFICATION (pTNM, AJCC 8th Edition)     Reporting of pT, pN, and (when applicable) pM categories is based on information available to the pathologist at the time the report is issued. As per the AJCC (Chapter 1, 8th Ed.) it is the managing physician's responsibility to establish the final  pathologic stage based upon all pertinent information, including but potentially not limited to this pathology report.     Primary Tumor (pT):    pT3b     Regional Lymph Nodes (pN):    pN not assigned (no nodes submitted or found)    03/08/2023 Imaging   CT CAP Status post radical right nephrectomy, IVC repair, and partial right adrenalectomy without evidence of recurrent or metastatic disease.  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  L5 compression deformity without progressive interval height loss  Lungs/Pleura: No large airspace consolidation or pleural effusion. Similar distribution of numerous thin-walled pulmonary cysts throughout the bilateral lungs. Bibasilar atelectasis/scarring.  No new pulmonary nodules.    09/13/2023 Imaging   CT CAP 5 mm left lower lobe pulmonary nodule Solid upper lobe pulmonary nodule 3 mm 4 mm right lower lobe nodule Sclerotic focus T1 change from prior likely bone island.  No aggressive lytic lesion.  Multilevel degenerative changes of spine and bilateral shoulder Similar appearance of sclerotic superior endplate deformity at L5 unchanged dating back to September 2023.  New from April 2019. 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 No pathologically enlarged abdominal or pelvic lymph nodes.   10/08/2023 Initial  Diagnosis   Renal cell carcinoma, right (HCC)   11/08/2023 - 04/03/2024 Chemotherapy   Patient is on Treatment Plan : BLADDER Pembrolizumab  (200) q21d     04/09/2024 Imaging   MRI brain: At least 30 enhancing metastases are identified within the supratentorial and infratentorial brain.   04/17/2024 Imaging   CT CAP 1. Status post right nephrectomy and adrenalectomy. 2. Unchanged appearance of soft tissue nodule in the nephrectomy bed overlying the most superior portion of the right psoas, measuring 1.7 x 0.9 cm which remains highly suspicious for locally recurrent malignancy. 3. Multiple small bilateral pulmonary nodules, not significantly changed, measuring up to 0.6 cm, nonspecific. Continued attention on follow-up. 4. No other evidence of lymphadenopathy or metastatic disease in the chest, abdomen, or pelvis. 5. Similar intra and extrahepatic biliary ductal dilatation, common bile duct dilatation measuring up to 1.1 cm in caliber with associated periportal edema. No calculus or other obstruction visible to the ampulla. 6. Unchanged cystic lesion of the dorsal pancreatic body measuring 1.3 x 1.3 cm. No pancreatic ductal dilatation or surrounding inflammatory changes. 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 Alternating hand movement intact.  Relevant data reviewed during this visit included labs.

## 2024-06-26 NOTE — Assessment & Plan Note (Addendum)
 More than 30 lesions on MRI and not a SRS candidate. Patient and wife declined RT to the brain Stable disease on cabo (05-05/2024) but lost QoL and side effects.  Elected to discontinue. Now on bev/everolimus  since 7/24 Repeat MR in about 2 months in end of September

## 2024-06-26 NOTE — Assessment & Plan Note (Signed)
 Refilled dex liquid mouth rinse

## 2024-06-30 ENCOUNTER — Inpatient Hospital Stay

## 2024-07-01 ENCOUNTER — Telehealth: Payer: Self-pay | Admitting: Radiation Therapy

## 2024-07-01 ENCOUNTER — Other Ambulatory Visit: Payer: Self-pay | Admitting: Radiation Therapy

## 2024-07-01 DIAGNOSIS — C7931 Secondary malignant neoplasm of brain: Secondary | ICD-10-CM

## 2024-07-01 NOTE — Telephone Encounter (Signed)
 Called and left a detailed message about the rescheduled brain MRI appointment. Pt informed that there is a $75 no show fee if he does not show or cancel in advance.  My contact information was included with a request to call back.  Devere Perch R.T.(R)(T) Radiation Special Procedures Lead

## 2024-07-03 DIAGNOSIS — H401131 Primary open-angle glaucoma, bilateral, mild stage: Secondary | ICD-10-CM | POA: Diagnosis not present

## 2024-07-03 DIAGNOSIS — E119 Type 2 diabetes mellitus without complications: Secondary | ICD-10-CM | POA: Diagnosis not present

## 2024-07-04 ENCOUNTER — Inpatient Hospital Stay

## 2024-07-04 ENCOUNTER — Inpatient Hospital Stay (HOSPITAL_BASED_OUTPATIENT_CLINIC_OR_DEPARTMENT_OTHER): Admitting: Physician Assistant

## 2024-07-04 ENCOUNTER — Telehealth: Payer: Self-pay | Admitting: Radiation Therapy

## 2024-07-04 ENCOUNTER — Inpatient Hospital Stay: Admitting: Dietician

## 2024-07-04 VITALS — BP 157/74 | HR 54 | Resp 17

## 2024-07-04 VITALS — BP 162/85 | HR 56 | Temp 98.6°F | Wt 202.0 lb

## 2024-07-04 DIAGNOSIS — Z9221 Personal history of antineoplastic chemotherapy: Secondary | ICD-10-CM | POA: Diagnosis not present

## 2024-07-04 DIAGNOSIS — Z7952 Long term (current) use of systemic steroids: Secondary | ICD-10-CM | POA: Insufficient documentation

## 2024-07-04 DIAGNOSIS — Z860101 Personal history of adenomatous and serrated colon polyps: Secondary | ICD-10-CM | POA: Diagnosis not present

## 2024-07-04 DIAGNOSIS — C649 Malignant neoplasm of unspecified kidney, except renal pelvis: Secondary | ICD-10-CM

## 2024-07-04 DIAGNOSIS — M129 Arthropathy, unspecified: Secondary | ICD-10-CM | POA: Insufficient documentation

## 2024-07-04 DIAGNOSIS — G4733 Obstructive sleep apnea (adult) (pediatric): Secondary | ICD-10-CM | POA: Insufficient documentation

## 2024-07-04 DIAGNOSIS — C641 Malignant neoplasm of right kidney, except renal pelvis: Secondary | ICD-10-CM

## 2024-07-04 DIAGNOSIS — Z8042 Family history of malignant neoplasm of prostate: Secondary | ICD-10-CM | POA: Diagnosis not present

## 2024-07-04 DIAGNOSIS — E785 Hyperlipidemia, unspecified: Secondary | ICD-10-CM | POA: Insufficient documentation

## 2024-07-04 DIAGNOSIS — Z5112 Encounter for antineoplastic immunotherapy: Secondary | ICD-10-CM | POA: Insufficient documentation

## 2024-07-04 DIAGNOSIS — Z79899 Other long term (current) drug therapy: Secondary | ICD-10-CM | POA: Diagnosis not present

## 2024-07-04 DIAGNOSIS — Z800 Family history of malignant neoplasm of digestive organs: Secondary | ICD-10-CM | POA: Insufficient documentation

## 2024-07-04 DIAGNOSIS — I10 Essential (primary) hypertension: Secondary | ICD-10-CM | POA: Insufficient documentation

## 2024-07-04 DIAGNOSIS — C61 Malignant neoplasm of prostate: Secondary | ICD-10-CM | POA: Diagnosis not present

## 2024-07-04 DIAGNOSIS — E119 Type 2 diabetes mellitus without complications: Secondary | ICD-10-CM | POA: Insufficient documentation

## 2024-07-04 DIAGNOSIS — R42 Dizziness and giddiness: Secondary | ICD-10-CM | POA: Insufficient documentation

## 2024-07-04 DIAGNOSIS — G4730 Sleep apnea, unspecified: Secondary | ICD-10-CM | POA: Insufficient documentation

## 2024-07-04 DIAGNOSIS — R21 Rash and other nonspecific skin eruption: Secondary | ICD-10-CM | POA: Insufficient documentation

## 2024-07-04 DIAGNOSIS — Z905 Acquired absence of kidney: Secondary | ICD-10-CM | POA: Insufficient documentation

## 2024-07-04 DIAGNOSIS — K1379 Other lesions of oral mucosa: Secondary | ICD-10-CM | POA: Insufficient documentation

## 2024-07-04 DIAGNOSIS — N529 Male erectile dysfunction, unspecified: Secondary | ICD-10-CM | POA: Insufficient documentation

## 2024-07-04 DIAGNOSIS — E860 Dehydration: Secondary | ICD-10-CM

## 2024-07-04 DIAGNOSIS — R918 Other nonspecific abnormal finding of lung field: Secondary | ICD-10-CM | POA: Insufficient documentation

## 2024-07-04 DIAGNOSIS — C7931 Secondary malignant neoplasm of brain: Secondary | ICD-10-CM | POA: Diagnosis not present

## 2024-07-04 LAB — CMP (CANCER CENTER ONLY)
ALT: 93 U/L — ABNORMAL HIGH (ref 0–44)
AST: 34 U/L (ref 15–41)
Albumin: 3.8 g/dL (ref 3.5–5.0)
Alkaline Phosphatase: 76 U/L (ref 38–126)
Anion gap: 8 (ref 5–15)
BUN: 28 mg/dL — ABNORMAL HIGH (ref 8–23)
CO2: 30 mmol/L (ref 22–32)
Calcium: 9.2 mg/dL (ref 8.9–10.3)
Chloride: 98 mmol/L (ref 98–111)
Creatinine: 1.17 mg/dL (ref 0.61–1.24)
GFR, Estimated: >60 mL/min (ref >60)
Glucose, Bld: 192 mg/dL — ABNORMAL HIGH (ref 70–99)
Potassium: 3.9 mmol/L (ref 3.5–5.1)
Sodium: 136 mmol/L (ref 135–145)
Total Bilirubin: 0.5 mg/dL (ref 0.0–1.2)
Total Protein: 6.9 g/dL (ref 6.5–8.1)

## 2024-07-04 LAB — CBC WITH DIFFERENTIAL (CANCER CENTER ONLY)
Abs Immature Granulocytes: 0.48 K/uL — ABNORMAL HIGH (ref 0.00–0.07)
Basophils Absolute: 0.1 K/uL (ref 0.0–0.1)
Basophils Relative: 1 %
Eosinophils Absolute: 0 K/uL (ref 0.0–0.5)
Eosinophils Relative: 0 %
HCT: 39.7 % (ref 39.0–52.0)
Hemoglobin: 12.7 g/dL — ABNORMAL LOW (ref 13.0–17.0)
Immature Granulocytes: 4 %
Lymphocytes Relative: 12 %
Lymphs Abs: 1.4 K/uL (ref 0.7–4.0)
MCH: 28 pg (ref 26.0–34.0)
MCHC: 32 g/dL (ref 30.0–36.0)
MCV: 87.4 fL (ref 80.0–100.0)
Monocytes Absolute: 0.8 K/uL (ref 0.1–1.0)
Monocytes Relative: 7 %
Neutro Abs: 8.3 K/uL — ABNORMAL HIGH (ref 1.7–7.7)
Neutrophils Relative %: 76 %
Platelet Count: 208 K/uL (ref 150–400)
RBC: 4.54 MIL/uL (ref 4.22–5.81)
RDW: 18.6 % — ABNORMAL HIGH (ref 11.5–15.5)
WBC Count: 11.1 K/uL — ABNORMAL HIGH (ref 4.0–10.5)
nRBC: 1 % — ABNORMAL HIGH (ref 0.0–0.2)

## 2024-07-04 MED ORDER — SODIUM CHLORIDE 0.9 % IV SOLN: INTRAVENOUS | Status: DC

## 2024-07-04 MED ORDER — SODIUM CHLORIDE 0.9 % IV SOLN
10.0000 mg/kg | Freq: Once | INTRAVENOUS | Status: AC
  Administered 2024-07-04: 800 mg via INTRAVENOUS
  Filled 2024-07-04: qty 32

## 2024-07-04 MED ORDER — SODIUM CHLORIDE 0.9 % IV SOLN
Freq: Once | INTRAVENOUS | Status: AC
Start: 2024-07-04 — End: 2024-07-04

## 2024-07-04 NOTE — Patient Instructions (Signed)
 [facility] - A DEPT OF [facility]  Discharge Instructions: Thank you for choosing [facility] to provide your oncology and hematology care.   If you have a lab appointment with the Cancer Center, please go directly to the Cancer Center and check in at the registration area.   Wear comfortable clothing and clothing appropriate for easy access to any Portacath or PICC line.   We strive to give you quality time with your provider. You may need to reschedule your appointment if you arrive late (15 or more minutes).  Arriving late affects you and other patients whose appointments are after yours.  Also, if you miss three or more appointments without notifying the office, you may be dismissed from the clinic at the provider's discretion.      For prescription refill requests, have your pharmacy contact our office and allow 72 hours for refills to be completed.    Today you received the following chemotherapy and/or immunotherapy agents: Bevacizumab (MVASI)   To help prevent nausea and vomiting after your treatment, we encourage you to take your nausea medication as directed.  BELOW ARE SYMPTOMS THAT SHOULD BE REPORTED IMMEDIATELY: *FEVER GREATER THAN 100.4 F (38 C) OR HIGHER *CHILLS OR SWEATING *NAUSEA AND VOMITING THAT IS NOT CONTROLLED WITH YOUR NAUSEA MEDICATION *UNUSUAL SHORTNESS OF BREATH *UNUSUAL BRUISING OR BLEEDING *URINARY PROBLEMS (pain or burning when urinating, or frequent urination) *BOWEL PROBLEMS (unusual diarrhea, constipation, pain near the anus) TENDERNESS IN MOUTH AND THROAT WITH OR WITHOUT PRESENCE OF ULCERS (sore throat, sores in mouth, or a toothache) UNUSUAL RASH, SWELLING OR PAIN  UNUSUAL VAGINAL DISCHARGE OR ITCHING   Items with * indicate a potential emergency and should be followed up as soon as possible or go to the Emergency Department if any problems should occur.  Please show the CHEMOTHERAPY ALERT CARD or  IMMUNOTHERAPY ALERT CARD at check-in to the Emergency Department and triage nurse.  Should you have questions after your visit or need to cancel or reschedule your appointment, please contact [facility] - A DEPT OF [facility]  Dept: [phone]  and follow the prompts.  Office hours are 8:00 a.m. to 4:30 p.m. Monday - Friday. Please note that voicemails left after 4:00 p.m. may not be returned until the following business day.  We are closed weekends and major holidays. You have access to a nurse at all times for urgent questions. Please call the main number to the clinic Dept: [phone] and follow the prompts.   For any non-urgent questions, you may also contact your provider using MyChart. We now offer e-Visits for anyone 71 and older to request care online for non-urgent symptoms. For details visit [email or web address].   Also download the MyChart app! Go to the app store, search "MyChart", open the app, select [place], and log in with your MyChart username and password.  Bevacizumab Injection What is this medication? BEVACIZUMAB (be va SIZ yoo mab) treats some types of cancer. It works by blocking a protein that causes cancer cells to grow and multiply. This helps to slow or stop the spread of cancer cells. It is a monoclonal antibody. This medicine may be used for other purposes; ask your health care provider or pharmacist if you have questions. COMMON BRAND NAME(S): Alymsys, Avastin, MVASI, Vegzalma, Zirabev What should I tell my care team before I take this medication? They need to know if you have any  of these conditions: Blood clots Coughing up blood Having or recent surgery Heart failure High blood pressure History of a connection between 2 or more body parts that do not usually connect (fistula) History of a tear in your stomach or intestines Protein in your urine An unusual or allergic reaction to bevacizumab, other medications, foods,  dyes, or preservatives Pregnant or trying to get pregnant Breast-feeding How should I use this medication? This medication is injected into a vein. It is given by your care team in a hospital or clinic setting. Talk to your care team the use of this medication in children. Special care may be needed. Overdosage: If you think you have taken too much of this medicine contact a poison control center or emergency room at once. NOTE: This medicine is only for you. Do not share this medicine with others. What if I miss a dose? Keep appointments for follow-up doses. It is important not to miss your dose. Call your care team if you are unable to keep an appointment. What may interact with this medication? Interactions are not expected. This list may not describe all possible interactions. Give your health care provider a list of all the medicines, herbs, non-prescription drugs, or dietary supplements you use. Also tell them if you smoke, drink alcohol, or use illegal drugs. Some items may interact with your medicine. What should I watch for while using this medication? Your condition will be monitored carefully while you are receiving this medication. You may need blood work while taking this medication. This medication may make you feel generally unwell. This is not uncommon as chemotherapy can affect healthy cells as well as cancer cells. Report any side effects. Continue your course of treatment even though you feel ill unless your care team tells you to stop. This medication may increase your risk to bruise or bleed. Call your care team if you notice any unusual bleeding. Before having surgery, talk to your care team to make sure it is ok. This medication can increase the risk of poor healing of your surgical site or wound. You will need to stop this medication for 28 days before surgery. After surgery, wait at least 28 days before restarting this medication. Make sure the surgical site or wound is  healed enough before restarting this medication. Talk to your care team if questions. Talk to your care team if you may be pregnant. Serious birth defects can occur if you take this medication during pregnancy and for 6 months after the last dose. Contraception is recommended while taking this medication and for 6 months after the last dose. Your care team can help you find the option that works for you. Do not breastfeed while taking this medication and for 6 months after the last dose. This medication can cause infertility. Talk to your care team if you are concerned about your fertility. What side effects may I notice from receiving this medication? Side effects that you should report to your care team as soon as possible: Allergic reactions--skin rash, itching, hives, swelling of the face, lips, tongue, or throat Bleeding--bloody or black, tar-like stools, vomiting blood or brown material that looks like coffee grounds, red or dark brown urine, small red or purple spots on skin, unusual bruising or bleeding Blood clot--pain, swelling, or warmth in the leg, shortness of breath, chest pain Heart attack--pain or tightness in the chest, shoulders, arms, or jaw, nausea, shortness of breath, cold or clammy skin, feeling faint or lightheaded Heart failure--shortness of breath,  swelling of the ankles, feet, or hands, sudden weight gain, unusual weakness or fatigue Increase in blood pressure Infection--fever, chills, cough, sore throat, wounds that don't heal, pain or trouble when passing urine, general feeling of discomfort or being unwell Infusion reactions--chest pain, shortness of breath or trouble breathing, feeling faint or lightheaded Kidney injury--decrease in the amount of urine, swelling of the ankles, hands, or feet Stomach pain that is severe, does not go away, or gets worse Stroke--sudden numbness or weakness of the face, arm, or leg, trouble speaking, confusion, trouble walking, loss of  balance or coordination, dizziness, severe headache, change in vision Sudden and severe headache, confusion, change in vision, seizures, which may be signs of posterior reversible encephalopathy syndrome (PRES) Side effects that usually do not require medical attention (report to your care team if they continue or are bothersome): Back pain Change in taste Diarrhea Dry skin Increased tears Nosebleed This list may not describe all possible side effects. Call your doctor for medical advice about side effects. You may report side effects to FDA at 1-800-FDA-1088. Where should I keep my medication? This medication is given in a hospital or clinic. It will not be stored at home. NOTE: This sheet is a summary. It may not cover all possible information. If you have questions about this medicine, talk to your doctor, pharmacist, or health care provider.  2024 Elsevier/Gold Standard (2022-03-31 00:00:00)

## 2024-07-04 NOTE — Progress Notes (Signed)
 Maintain beva dose at 800mg  today and f/u with dose adjustment at next visit after confirming with Dr. Tina per [employer], PA-C.  Tejasvi Brissett, PharmD, MBA

## 2024-07-04 NOTE — Telephone Encounter (Signed)
 Left a detailed message about the appointment date change with [name]. I explained in the message that this is a telephone visit, not in person. My contact information was included in case he has questions or conflicts.   Devere Perch R.T(R)(T) Radiation Special Procedures Lead

## 2024-07-04 NOTE — Progress Notes (Signed)
 [facility] Telephone:([phone]) [phone]   Fax:([phone]) [phone]  PROGRESS NOTE  Patient Care Team: Seabron Lenis, MD as PCP - General (Family Medicine)  CHIEF COMPLAINTS/PURPOSE OF CONSULTATION:  Metastatic RCC involving the brain  Oncology History  Malignant neoplasm of prostate (HCC)  04/01/2018 Initial Diagnosis   Malignant neoplasm of prostate (HCC)   04/30/2018 Genetic Testing   Multi-Cancer panel (83 genes) @ [facility] - No pathogenic mutations detected Variants of Uncertain Significance in ATM, SDHA and TSC2  Genes Analyzed: 83 genes on [facility]'s Multi-Cancer panel (ALK, APC, ATM, AXIN2, BAP1, BARD1, BLM, BMPR1A, BRCA1, BRCA2, BRIP1, CASR, CDC73, CDH1, CDK4, CDKN1B, CDKN1C, CDKN2A, CEBPA, CHEK2, CTNNA1, DICER1, DIS3L2, EGFR, EPCAM, FH, FLCN, GATA2, GPC3, GREM1, HOXB13, HRAS, KIT, MAX, MEN1, MET, MITF, MLH1, MSH2, MSH3, MSH6, MUTYH, NBN, NF1, NF2, NTHL1, PALB2, PDGFRA, PHOX2B, PMS2, POLD1, POLE, POT1, PRKAR1A, PTCH1, PTEN, RAD50, RAD51C, RAD51D, RB1, RECQL4, RET, RUNX1, SDHA, SDHAF2, SDHB, SDHC, SDHD, SMAD4, SMARCA4, SMARCB1, SMARCE1, STK11, SUFU, TERC, TERT, TMEM127, TP53, TSC1, TSC2, VHL, WRN, WT1).  UPDATE: ATM [id number] VUS was amended to Likely Benign due to a re-review of the evidence in light of new variant interpretation guidelines and/or new information.   Renal cell carcinoma, right (HCC)  09/29/2022 Imaging   CT chest 1.  No evidence of metastatic disease in the thorax.  2.  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 Consider correlation with genetic testing.  3.  Reference same day abdominal MRI for details regarding the right renal mass and associated tumor thrombus in the right renal vein and IVC    09/29/2022 Imaging   MRI 1.  Right interpolar renal 3.5 cm mass concerning for primary renal malignancy. Upper pole and lower pole tumor is favored to primarily be intravenous tumor  thrombus, with intravenous tumor expanding the right renal vein, and the subdiaphragmatic IVC as detailed above. Differential includes renal cell carcinoma (favored) and transitional cell carcinoma.  2.  Please see outside 08/25/2022 CT abdomen and pelvis for evaluation of the collecting system on delayed/urographic phase.  3.  No definite lymphadenopathy.  4.  Signal changes in the L5 superior endplate are favored to reflect a fracture. However, recommend continued attention on follow-up.    10/27/2022 Pathology Results   SPECIMEN    Procedure:    Radical nephrectomy     Specimen Laterality:    Right   TUMOR    Tumor Focality:    Unifocal     Tumor Size:    Greatest Dimension (Centimeters): 8.2 cm     Histologic Type:    Papillary renal cell carcinoma, type 2     Histologic Grade (WHO / ISUP):    G3 (nucleoli conspicuous and eosinophilic at 100x magnification)     Tumor Extent:    Extends into perinephric tissue (beyond renal capsule)     Tumor Extent:    Extends into renal sinus     Tumor Extent:    Extends into major vein (renal vein or its segmental branches, inferior vena cava)     Sarcomatoid Features:    Not identified     Rhabdoid Features:    Not identified     Tumor Necrosis:    Not identified     Lymphovascular Invasion:    Present   MARGINS    Margin Status:    Invasive carcinoma present at margin       Margin(s) Involved by Invasive Carcinoma:    Renal  vein: present in renal vein nephrectomy margin; No tumor seen is separately submitted renal vein segment.   REGIONAL LYMPH NODES     Regional Lymph Node Status:    Not applicable (no regional lymph nodes submitted or found)   PATHOLOGIC STAGE CLASSIFICATION (pTNM, AJCC 8th Edition)     Reporting of pT, pN, and (when applicable) pM categories is based on information available to the pathologist at the time the report is issued. As per the AJCC (Chapter 1, 8th Ed.) it is the managing physician's responsibility to establish the  final  pathologic stage based upon all pertinent information, including but potentially not limited to this pathology report.     Primary Tumor (pT):    pT3b     Regional Lymph Nodes (pN):    pN not assigned (no nodes submitted or found)    03/08/2023 Imaging   CT CAP Status post radical right nephrectomy, IVC repair, and partial right adrenalectomy without evidence of recurrent or metastatic disease.  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  L5 compression deformity without progressive interval height loss  Lungs/Pleura: No large airspace consolidation or pleural effusion. Similar distribution of numerous thin-walled pulmonary cysts throughout the bilateral lungs. Bibasilar atelectasis/scarring. No new pulmonary nodules.    09/13/2023 Imaging   CT CAP 5 mm left lower lobe pulmonary nodule Solid upper lobe pulmonary nodule 3 mm 4 mm right lower lobe nodule Sclerotic focus T1 change from prior likely bone island.  No aggressive lytic lesion.  Multilevel degenerative changes of spine and bilateral shoulder Similar appearance of sclerotic superior endplate deformity at L5 unchanged dating back to September 2023.  New from April 2019. 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 No pathologically enlarged abdominal or pelvic lymph nodes.   10/08/2023 Initial Diagnosis   Renal cell carcinoma, right (HCC)   11/08/2023 - 04/03/2024 Chemotherapy   Patient is on Treatment Plan : BLADDER Pembrolizumab  (200) q21d     04/09/2024 Imaging   MRI brain: At least 30 enhancing metastases are identified within the supratentorial and infratentorial brain.   04/17/2024 Imaging   CT  CAP 1. Status post right nephrectomy and adrenalectomy. 2. Unchanged appearance of soft tissue nodule in the nephrectomy bed overlying the most superior portion of the right psoas, measuring 1.7 x 0.9 cm which remains highly suspicious for locally recurrent malignancy. 3. Multiple small bilateral pulmonary nodules, not significantly changed, measuring up to 0.6 cm, nonspecific. Continued attention on follow-up. 4. No other evidence of lymphadenopathy or metastatic disease in the chest, abdomen, or pelvis. 5. Similar intra and extrahepatic biliary ductal dilatation, common bile duct dilatation measuring up to 1.1 cm in caliber with associated periportal edema. No calculus or other obstruction visible to the ampulla. 6. Unchanged cystic lesion of the dorsal pancreatic body measuring 1.3 x 1.3 cm. No pancreatic ductal dilatation or surrounding inflammatory changes. 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   CURRENT HISTORY:  [name] 78 y.o. male returns for today for Cycle 1, Day 15 of Bevacizumab  today. He is unaccompanied for this visit.   On exam today, Mr. Ambroise reports he is tolerating treatment without any significant toxicities. He does have some dizziness but admits to not drinking as much water as he should. He denies nausea, vomiting or bowel habit changes. He denies easy bruising or signs of active bleeding. He reports rash has resolved with steroid taper. He denies any oral discomfort with dexamethasone   rinse. He denies fevers, chills, sweats, shortness of breath, chest pain or cough. He has no other complaints. Rest of the ROS is below.   MEDICAL HISTORY:  Past Medical History:  Diagnosis Date   Allergy    Anemia    Arthritis    Diabetes mellitus without complication (HCC) 12/2021   ED (erectile dysfunction)    Family history of cancer    Family history of prostate cancer    Genetic testing 05/02/2018   Multi-Cancer panel (83 genes) @ [facility] - No pathogenic mutations detected   History of anemia    due to acute blood loss from lower GI bleeds 2007, 2010, 2013   History of GI diverticular bleed    tranfused 03/ 2010, 01/ 2013/  no blood needed 09/ 2014, 2015   History of lower GI bleeding 08/2006   post colonoscopy w/ polypectomy -- treatment colonoscopy w/ endo clip at polypectomy site and 2PRBCs   History of pneumothorax 07/2008   spontaneous-- tx chest tube   HLD (hyperlipidemia)    HTN (hypertension)    Hx of adenomatous colonic polyps    Hyperplasia of prostate with lower urinary tract symptoms (LUTS)    Mild obstructive sleep apnea    06-21-2018 per pt study done 8 yrs ago, told borderline osa , no cpap   Primary hypogonadism in male    Prostate cancer [facility]) urologist-  dr winter/  oncologist-  dr patrcia   first dx 04/ 2015  Stage T1c, PSA 4.94;  last prostate bx 02-25-2018  Stage T1c, Gleason 3+4, PSA 9.45, vol 120cc-- planned treatement external beam radiation   Wears glasses     SURGICAL HISTORY: Past Surgical History:  Procedure Laterality Date   COLONOSCOPY  02/08/2022   2019   COLONOSCOPY W/ POLYPECTOMY  last one 2014   GOLD SEED IMPLANT N/A 06/26/2018   Procedure: GOLD SEED IMPLANT;  Surgeon: Devere Lonni Righter, MD;  [facility];  Service: Urology;  Laterality: N/A;  ONLY NEEDS 30 MIN FOR ALL PROCEDURES   PROSTATE BIOPSY  last one 02-25-2018   dr winter office   SPACE OAR INSTILLATION N/A 06/26/2018   Procedure: SPACE OAR  INSTILLATION;  Surgeon: Devere Lonni Righter, MD;  Location: [facility];  Service: Urology;  Laterality: N/A;   TONSILLECTOMY  child   UMBILICAL HERNIA REPAIR  2012    SOCIAL HISTORY: Social History   Socioeconomic History   Marital status: Married    Spouse name: Not on file   Number of children: 2   Years of education: Not on file   Highest education level: Not on file  Occupational History   Occupation: HIGHWAY INSPECTOR    Employer: [employer]  Tobacco Use  Smoking status: Never   Smokeless tobacco: Current    Types: Chew   Tobacco comments:    06-21-2018 chew tobacco since age 56 (since 29s) 27oz/week  (60 yrs)  Vaping Use   Vaping status: Never Used  Substance and Sexual Activity   Alcohol use: Not Currently    Comment: weekend--- case of beer   Drug use: No   Sexual activity: Not on file  Other Topics Concern   Not on file  Social History Narrative   The patient is married and lives in [place].   Social Drivers of Corporate investment banker Strain: Not on file  Food Insecurity: No Food Insecurity (04/10/2024)   Hunger Vital Sign    Worried About Running Out of Food in the Last Year: Never true    Ran Out of Food in the Last Year: Never true  Transportation Needs: No Transportation Needs (04/10/2024)   PRAPARE - Administrator, Civil Service (Medical): No    Lack of Transportation (Non-Medical): No  Physical Activity: Not on file  Stress: Not on file  Social Connections: Not on file  Intimate Partner Violence: Not At Risk (04/10/2024)   Humiliation, Afraid, Rape, and Kick questionnaire    Fear of Current or Ex-Partner: No    Emotionally Abused: No    Physically Abused: No    Sexually Abused: No    FAMILY HISTORY: Family History  Problem Relation Age of Onset   Heart disease Mother    Diabetes Mother    Hypertension Mother    Cancer Father 36       metastatic; unk. primary; deceased 98   Cancer  Maternal Uncle        unk. primary   Cancer Paternal Aunt        unk. primary; died young   Colon cancer Paternal Uncle 71       deceased 88   Prostate cancer Cousin 58       maternal first cousin; son of uncle with metastatic cancer   Prostate cancer Cousin        paternal first cousin; son of unaffected paternal aunt   Esophageal cancer Neg Hx    Rectal cancer Neg Hx    Stomach cancer Neg Hx    Colon polyps Neg Hx     ALLERGIES:  is allergic to bee venom, aspirin, tadalafil, triamterene-hctz, and penicillins.  MEDICATIONS:  Current Outpatient Medications  Medication Sig Dispense Refill   amLODipine  (NORVASC ) 5 MG tablet Take 2 tablets (10 mg total) by mouth daily.     atorvastatin  (LIPITOR) 10 MG tablet Take 10 mg by mouth in the morning.     dexamethasone  (DECADRON ) 2 MG tablet Start a 4 tabs (8 mg ) today, then again once tomorrow, then 4 mg daily for 4 days, then 2 mg daily for one week, then 1 mg daily. 23 tablet 0   dronabinol  (MARINOL ) 5 MG capsule Take 1 capsule (5 mg total) by mouth 2 (two) times daily before a meal. 60 capsule 0   EPINEPHrine  0.3 mg/0.3 mL IJ SOAJ injection Inject 0.3 mg into the muscle as needed for anaphylaxis.     everolimus  (AFINITOR ) 10 MG tablet Take 1 tablet (10 mg total) by mouth daily. 28 tablet 5   finasteride  (PROSCAR ) 5 MG tablet Take 5 mg by mouth in the morning.     Grape Seed Extract 100 MG CAPS Take 300 mg by mouth. 300 mg     latanoprost  (  XALATAN ) 0.005 % ophthalmic solution 1 drop at bedtime.     lisinopril -hydrochlorothiazide  (ZESTORETIC ) 20-25 MG tablet 1 tablet Orally Once a day for 90 days     Multiple Vitamins-Minerals (MULTIVITAMIN WITH MINERALS) tablet Take 1 tablet by mouth in the morning.     omeprazole  (PRILOSEC) 20 MG capsule TAKE 1 CAPSULE(20 MG) BY MOUTH DAILY 90 capsule 1   OVER THE COUNTER MEDICATION Take 1 tablet by mouth in the morning. Lion's mane supplement     sildenafil (VIAGRA) 100 MG tablet Take 100 mg by mouth as  needed.     tamsulosin  (FLOMAX ) 0.4 MG CAPS capsule Take 0.4 mg by mouth daily after breakfast.      triamcinolone  ointment (KENALOG ) 0.5 % Apply 1 Application topically 2 (two) times daily. 30 g 0   dexamethasone  (DECADRON ) 1 MG tablet Start 1 mg daily after finish the 2 mg tabs as taper per other prescription. (Patient not taking: Reported on 07/04/2024) 30 tablet 0   No current facility-administered medications for this visit.    REVIEW OF SYSTEMS:   Constitutional: ( - ) fevers, ( - )  chills , ( - ) night sweats Eyes: ( - ) blurriness of vision, ( - ) double vision, ( - ) watery eyes Ears, nose, mouth, throat, and face: ( - ) mucositis, ( - ) sore throat Respiratory: ( - ) cough, ( - ) dyspnea, ( - ) wheezes Cardiovascular: ( - ) palpitation, ( - ) chest discomfort, ( - ) lower extremity swelling Gastrointestinal:  ( - ) nausea, ( - ) heartburn, ( - ) change in bowel habits Skin: ( - ) abnormal skin rashes Lymphatics: ( - ) new lymphadenopathy, ( - ) easy bruising Neurological: ( - ) numbness, ( - ) tingling, ( - ) new weaknesses Behavioral/Psych: ( - ) mood change, ( - ) new changes  All other systems were reviewed with the patient and are negative.  PHYSICAL EXAMINATION: ECOG PERFORMANCE STATUS: 1 - Symptomatic but completely ambulatory  Vitals:   07/04/24 0833  BP: (!) 162/85  Pulse: (!) 56  Temp: 98.6 F (37 C)  SpO2: 99%   Filed Weights   07/04/24 0833  Weight: 202 lb (91.6 kg)    GENERAL: well appearing male in NAD  SKIN: skin color, texture, turgor are normal, no rashes or significant lesions EYES: conjunctiva are pink and non-injected, sclera clear OROPHARYNX: no exudate, no erythema; lips, buccal mucosa, and tongue normal  NECK: supple, non-tender LUNGS: clear to auscultation and percussion with normal breathing effort HEART: regular rate & rhythm and no murmurs and no lower extremity edema Musculoskeletal: no cyanosis of digits and no clubbing  PSYCH: alert &  oriented x 3, fluent speech NEURO: no focal motor/sensory deficits  LABORATORY DATA:  I have reviewed the data as listed    Latest Ref Rng & Units 07/04/2024    8:06 AM 06/19/2024    9:42 AM 06/12/2024    8:27 AM  CBC  WBC 4.0 - 10.5 K/uL 11.1  5.5  3.6   Hemoglobin 13.0 - 17.0 g/dL 87.2  88.2  86.9   Hematocrit 39.0 - 52.0 % 39.7  36.0  38.6   Platelets 150 - 400 K/uL 208  240  275        Latest Ref Rng & Units 06/19/2024    9:42 AM 06/12/2024    8:27 AM 05/22/2024   10:30 AM  CMP  Glucose 70 - 99 mg/dL 871  99  113   BUN 8 - 23 mg/dL 15  15  25    Creatinine 0.61 - 1.24 mg/dL 8.71  8.62  8.40   Sodium 135 - 145 mmol/L 137  135  131   Potassium 3.5 - 5.1 mmol/L 3.9  4.9  4.5   Chloride 98 - 111 mmol/L 104  102  94   CO2 22 - 32 mmol/L 27  29  31    Calcium  8.9 - 10.3 mg/dL 9.4  9.6  9.4   Total Protein 6.5 - 8.1 g/dL 7.3  7.5  7.8   Total Bilirubin 0.0 - 1.2 mg/dL 0.3  0.4  0.6   Alkaline Phos 38 - 126 U/L 75  87  86   AST 15 - 41 U/L 25  26  26    ALT 0 - 44 U/L 33  36  38       RADIOGRAPHIC STUDIES: I have personally reviewed the radiological images as listed and agreed with the findings in the report. CT CHEST ABDOMEN PELVIS W CONTRAST Result Date: 06/17/2024 EXAM:  CT CHEST ABDOMEN PELVIS WITH IV CONTRAST INDICATION:  follow up CT for treatment resposne with RCC TECHNIQUE: Spiral CT scanning was performed through the chest, abdomen and pelvis after the patient received IV contrast. COMPARISON: 04/17/2024 FINDINGS: The cardiac size is within normal limits. There is no thoracic aortic aneurysm. No filling defects are identified in the central pulmonary arteries. The thyroid  gland and esophagus are within normal limits. No mass or adenopathy is identified in the chest. There are no pleural or pericardial effusions. Scattered pneumatoceles are present in both lungs. A stable 6 mm rounded nodule is present in the left lower lobe. There are smaller scattered subpleural micronodules in  both lungs. No developing or enlarging pulmonary mass is identified. Small hepatic cysts are present which do not require follow-up. There is no suspicious hepatic mass. The spleen and gallbladder are unremarkable. There is stable dilatation of the common duct. A stable 1.3 cm cyst is present in the distal pancreatic body which does not require follow-up. The left kidney has a normal appearance. The right kidney and adrenal glands are surgically absent. There is a 2.1 x 1.0 cm area of enhancement in the superior postnephrectomy space along the margin of the psoas muscle. The abdominal bowel loops are unremarkable except for moderate stool retention. There is no evidence of ascites or adenopathy. No abdominal aortic aneurysm is present. Stable enlargement of the prostate is present. The median lobe indents the bladder base. There is no inguinal hernia. The rectum is distended with stool. There is mild sigmoid diverticulosis with no associated inflammation There is no fracture or bone destruction. IMPRESSION: 1. Minimal increase in the enhancing soft tissue in the postnephrectomy space. Local recurrence cannot be excluded. 2. No other areas of suspected metastatic disease are identified in the chest, abdomen and pelvis. 3. Stable nonacute findings as described above. Please note that CT scanning at this site utilizes multiple dose reduction techniques, including automatic exposure control, adjustment of the MAA and/or KVP according to the patient's size, and use of iterative reconstruction. Electronically signed by: Eddy Oar MD 06/17/2024 03:21 PM EDT RP Workstation: [id number]    ASSESSMENT & PLAN [name] is a 78 y.o. male who presents to the clinic for evaluation of metastatic RCC.   #Metastatic RCC: --Initial diagnosis: 10/2022. pT3cN0M0 G3 right papillary RCC, type 2 --Previous Treatment: 10/27/22 Open radical nephrectomy with IVC repair, cardiac bypass for level 3 thrombus at  [facility] --Genetic testing: negative for actionable mutation. --More than 30 lesions on MRI and not a SRS candidate. Patient and wife declined RT to the brain. --Previous treatment includes: 10/2023 first-line treatment cycle 1 day 1 lenvatinib  with pembrolizumab .  04/17/24 2nd line cabozantinib . Difficulty tolerating. Fatigue, loss of appetite, mouth pain, elected to discontinue. Stable disease on interval MRI of brain in July. 06/19/24 3rd line bev/everolimus  PLAN: --Due for cycle 1, day 15 of bevacizumab  plus everolimus  today --Labs from today were reviewed and adequate for treatment. WBC 11.1, Hgb 12.7, MCV 87.4, Plt 208, creatinine normal. AST normal. ALT 93.  --Proceed with treatment without any dose modifications --Next MRI brain scheduled for 07/09/2024. --RTC in 2 weeks with labs and follow up prior to Cycle 2, Day 1.   #Oral pain: --continue with dexamethasone  rinse  #Rash: --Resolved with steroid taper. Continue with kenalog  0.5% BID  as needed  No orders of the defined types were placed in this encounter.   All questions were answered. The patient knows to call the clinic with any problems, questions or concerns.  I have spent a total of 30 minutes minutes of face-to-face and non-face-to-face time, preparing to see the patient,performing a medically appropriate examination, counseling and educating the patient,documenting clinical information in the electronic health record, independently interpreting results and communicating results to the patient, and care coordination.   Johnston Police, PA-C Department of Hematology/Oncology [facility] Phone: [phone]

## 2024-07-06 ENCOUNTER — Other Ambulatory Visit: Payer: Self-pay

## 2024-07-07 ENCOUNTER — Other Ambulatory Visit: Payer: Self-pay

## 2024-07-07 NOTE — Progress Notes (Signed)
 Specialty Pharmacy Ongoing Clinical Assessment Note  [name] is a 78 y.o. male who is being followed by the specialty pharmacy service for RxSp Oncology   Patient's specialty medication(s) reviewed today: Everolimus  (AFINITOR )   Missed doses in the last 4 weeks: 0   Patient/Caregiver did not have any additional questions or concerns.   Therapeutic benefit summary: Unable to assess   Adverse events/side effects summary: No adverse events/side effects   Patient's therapy is appropriate to: Continue    Goals Addressed             This Visit's Progress    Maintain optimal adherence to therapy   On track    Patient is on track. Patient will maintain adherence         Follow up: 3 months  [facility] Specialty Pharmacist

## 2024-07-07 NOTE — Progress Notes (Signed)
 Specialty Pharmacy Refill Coordination Note  [name] is a 78 y.o. male contacted today regarding refills of specialty medication(s) Everolimus  (AFINITOR )   Patient requested Delivery   Delivery date: 07/15/24   Verified address: [address] [place] [address]   Medication will be filled on 07/14/24.

## 2024-07-09 ENCOUNTER — Ambulatory Visit
Admission: RE | Admit: 2024-07-09 | Discharge: 2024-07-09 | Disposition: A | Discharge status: Home / Self Care | Source: Ambulatory Visit | Attending: Radiation Oncology | Admitting: Radiation Oncology

## 2024-07-09 DIAGNOSIS — C7931 Secondary malignant neoplasm of brain: Secondary | ICD-10-CM

## 2024-07-09 DIAGNOSIS — C801 Malignant (primary) neoplasm, unspecified: Secondary | ICD-10-CM | POA: Diagnosis not present

## 2024-07-09 DIAGNOSIS — G9389 Other specified disorders of brain: Secondary | ICD-10-CM | POA: Diagnosis not present

## 2024-07-09 MED ORDER — GADOPICLENOL 0.5 MMOL/ML IV SOLN
9.0000 mL | Freq: Once | INTRAVENOUS | Status: AC | PRN
  Administered 2024-07-09 (×2): 9 mL via INTRAVENOUS

## 2024-07-11 ENCOUNTER — Other Ambulatory Visit (HOSPITAL_BASED_OUTPATIENT_CLINIC_OR_DEPARTMENT_OTHER): Payer: Self-pay

## 2024-07-14 ENCOUNTER — Inpatient Hospital Stay

## 2024-07-16 ENCOUNTER — Ambulatory Visit: Admitting: Urology

## 2024-07-16 NOTE — Assessment & Plan Note (Signed)
 More than 30 lesions on MRI and not a SRS candidate. Patient and wife declined RT to the brain Stable disease on cabo (05-05/2024) but lost QoL and side effects.  Elected to discontinue. Now on bev/everolimus  since 7/24 Repeat MR in about 2 months in end of September

## 2024-07-16 NOTE — Assessment & Plan Note (Signed)
 Refilled dex liquid mouth rinse

## 2024-07-16 NOTE — Progress Notes (Unsigned)
 [name] OFFICE PROGRESS NOTE  Patient Care Team: Seabron Lenis, MD as PCP - General (Family Medicine)  [name] is a 78 y.o. male with history of DM, HTN, HLD, OSA, and prostate cancer (T1c GG2 GS 3+4) s/p EBRT alone in 2019 and Right papillary RCC s/p nephrectomy in 10/2022 referred to Medical Oncology Clinic for [facility]. Currently with stage IV RCC. Staging imaging showed brain metastases, and pulmonary nodules suggestive of lung metastases.    Current diagnosis: stage IV RCC with brain metastases Initial diagnosis: 10/2022. pT3cN0M0 G3 right papillary RCC, type 2 Previous Treatment: 10/27/22 Open radical nephrectomy with IVC repair, cardiac bypass for level 3 thrombus at [facility] testing: negative for actionable mutation.   Systemic treatment: 10/2023 first-line treatment cycle 1 day 1 lenvatinib  with pembrolizumab .  04/17/24 2nd line cabozantinib . Difficulty tolerating. Fatigue, loss of appetite, mouth pain, elected to discontinue. Stable disease on interval MRI of brain in July. 06/19/24 3rd line bev/everolimus    He tolerated treatment better.  However, report of previous rash that was resolving that has worsened after starting new treatment.  Rash over the bilateral axilla, lower extremity and side.  Will start steroid with taper today.   Mouth pain is being controlled with dexamethasone  rinse.  Refill today.   Discussed that if headaches continue to improve, then likely signs of response.  In that case repeat imaging in about end of September.  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 Patient and wife declined RT to the brain Stable disease on cabo (05-05/2024) but lost QoL and side effects.  Elected to discontinue. Now on bev/everolimus  since 7/24 Repeat MR in about 2 months in end of September Rash Now itching Continue dexamethasone  1 mg daily Anti-itch cream Refilled kenalog   0.5 % to use twice daily until resolved. Mouth pain Refilled dex liquid mouth rinse  No orders of the defined types were placed in this encounter.    Garrett [name], MD  INTERVAL HISTORY: Patient returns for follow-up.  Oncology History  Malignant neoplasm of prostate (HCC)  04/01/2018 Initial Diagnosis   Malignant neoplasm of prostate (HCC)   04/30/2018 Genetic Testing   Multi-Cancer panel (83 genes) @ [facility] - No pathogenic mutations detected Variants of Uncertain Significance in ATM, SDHA and TSC2  Genes Analyzed: 83 genes on [facility]'s Multi-Cancer panel (ALK, APC, ATM, AXIN2, BAP1, BARD1, BLM, BMPR1A, BRCA1, BRCA2, BRIP1, CASR, CDC73, CDH1, CDK4, CDKN1B, CDKN1C, CDKN2A, CEBPA, CHEK2, CTNNA1, DICER1, DIS3L2, EGFR, EPCAM, FH, FLCN, GATA2, GPC3, GREM1, HOXB13, HRAS, KIT, MAX, MEN1, MET, MITF, MLH1, MSH2, MSH3, MSH6, MUTYH, NBN, NF1, NF2, NTHL1, PALB2, PDGFRA, PHOX2B, PMS2, POLD1, POLE, POT1, PRKAR1A, PTCH1, PTEN, RAD50, RAD51C, RAD51D, RB1, RECQL4, RET, RUNX1, SDHA, SDHAF2, SDHB, SDHC, SDHD, SMAD4, SMARCA4, SMARCB1, SMARCE1, STK11, SUFU, TERC, TERT, TMEM127, TP53, TSC1, TSC2, VHL, WRN, WT1).  UPDATE: ATM [id number] VUS was amended to Likely Benign due to a re-review of the evidence in light of new variant interpretation guidelines and/or new information.   Renal cell carcinoma, right (HCC)  09/29/2022 Imaging   CT chest 1.  No evidence of metastatic disease in the thorax.  2.  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 Consider correlation with genetic testing.  3.  Reference same day abdominal MRI for details regarding the right renal mass and associated tumor thrombus in the right renal vein and IVC  09/29/2022 Imaging   MRI 1.  Right interpolar renal 3.5 cm mass concerning for primary renal malignancy. Upper pole and lower pole tumor is favored to primarily be intravenous tumor thrombus, with  intravenous tumor expanding the right renal vein, and the subdiaphragmatic IVC as detailed above. Differential includes renal cell carcinoma (favored) and transitional cell carcinoma.  2.  Please see outside 08/25/2022 CT abdomen and pelvis for evaluation of the collecting system on delayed/urographic phase.  3.  No definite lymphadenopathy.  4.  Signal changes in the L5 superior endplate are favored to reflect a fracture. However, recommend continued attention on follow-up.    10/27/2022 Pathology Results   SPECIMEN    Procedure:    Radical nephrectomy     Specimen Laterality:    Right   TUMOR    Tumor Focality:    Unifocal     Tumor Size:    Greatest Dimension (Centimeters): 8.2 cm     Histologic Type:    Papillary renal cell carcinoma, type 2     Histologic Grade (WHO / ISUP):    G3 (nucleoli conspicuous and eosinophilic at 100x magnification)     Tumor Extent:    Extends into perinephric tissue (beyond renal capsule)     Tumor Extent:    Extends into renal sinus     Tumor Extent:    Extends into major vein (renal vein or its segmental branches, inferior vena cava)     Sarcomatoid Features:    Not identified     Rhabdoid Features:    Not identified     Tumor Necrosis:    Not identified     Lymphovascular Invasion:    Present   MARGINS    Margin Status:    Invasive carcinoma present at margin       Margin(s) Involved by Invasive Carcinoma:    Renal vein: present in renal vein nephrectomy margin; No tumor seen is separately submitted renal vein segment.   REGIONAL LYMPH NODES     Regional Lymph Node Status:    Not applicable (no regional lymph nodes submitted or found)   PATHOLOGIC STAGE CLASSIFICATION (pTNM, AJCC 8th Edition)     Reporting of pT, pN, and (when applicable) pM categories is based on information available to the pathologist at the time the report is issued. As per the AJCC (Chapter 1, 8th Ed.) it is the managing physician's responsibility to establish the final   pathologic stage based upon all pertinent information, including but potentially not limited to this pathology report.     Primary Tumor (pT):    pT3b     Regional Lymph Nodes (pN):    pN not assigned (no nodes submitted or found)    03/08/2023 Imaging   CT CAP Status post radical right nephrectomy, IVC repair, and partial right adrenalectomy without evidence of recurrent or metastatic disease.  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  L5 compression deformity without progressive interval height loss  Lungs/Pleura: No large airspace consolidation or pleural effusion. Similar distribution of numerous thin-walled pulmonary cysts throughout the bilateral lungs. Bibasilar atelectasis/scarring. No new pulmonary nodules.    09/13/2023 Imaging   CT CAP 5 mm left lower lobe pulmonary nodule Solid upper lobe pulmonary nodule 3 mm 4 mm right lower lobe nodule Sclerotic focus T1 change from prior likely bone island.  No aggressive lytic lesion.  Multilevel degenerative changes of spine and bilateral shoulder Similar appearance of sclerotic superior  endplate deformity at L5 unchanged dating back to September 2023.  New from April 2019. 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 No pathologically enlarged abdominal or pelvic lymph nodes.   10/08/2023 Initial Diagnosis   Renal cell carcinoma, right (HCC)   11/08/2023 - 04/03/2024 Chemotherapy   Patient is on Treatment Plan : BLADDER Pembrolizumab  (200) q21d     04/09/2024 Imaging   MRI brain: At least 30 enhancing metastases are identified within the supratentorial and infratentorial brain.   04/17/2024 Imaging   CT  CAP 1. Status post right nephrectomy and adrenalectomy. 2. Unchanged appearance of soft tissue nodule in the nephrectomy bed overlying the most superior portion of the right psoas, measuring 1.7 x 0.9 cm which remains highly suspicious for locally recurrent malignancy. 3. Multiple small bilateral pulmonary nodules, not significantly changed, measuring up to 0.6 cm, nonspecific. Continued attention on follow-up. 4. No other evidence of lymphadenopathy or metastatic disease in the chest, abdomen, or pelvis. 5. Similar intra and extrahepatic biliary ductal dilatation, common bile duct dilatation measuring up to 1.1 cm in caliber with associated periportal edema. No calculus or other obstruction visible to the ampulla. 6. Unchanged cystic lesion of the dorsal pancreatic body measuring 1.3 x 1.3 cm. No pancreatic ductal dilatation or surrounding inflammatory changes. 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)   06/19/2024 -  Chemotherapy   Patient is on Treatment Plan : BRAIN Bevacizumab  + Everolimus  q28d        PHYSICAL EXAMINATION: ECOG PERFORMANCE STATUS: {CHL ONC ECOG ED:[id number]}  Vitals:   07/17/24 1335 07/17/24 1343  BP: (!) 152/76 (!) 143/77  Pulse: 85   Resp: 20   Temp: (!) 97.5 F (36.4 C)   SpO2: 98%    Filed Weights   07/17/24 1335  Weight: 203 lb 9.6 oz (92.4 kg)    GENERAL: alert, no distress and comfortable SKIN: skin color normal and no bruising or petechiae or jaundice on exposed skin EYES: normal, sclera clear OROPHARYNX: no exudate  NECK: No palpable mass LYMPH:  no palpable cervical, axillary lymphadenopathy  LUNGS: clear to  auscultation and percussion with normal breathing effort HEART: regular rate & rhythm  ABDOMEN: abdomen soft, non-tender and nondistended. Musculoskeletal: no edema NEURO: no focal motor/sensory deficits  Relevant data reviewed during this visit included ***

## 2024-07-16 NOTE — Assessment & Plan Note (Signed)
 Now itching Continue dexamethasone  1 mg daily Anti-itch cream Refilled kenalog  0.5 % to use twice daily until resolved.

## 2024-07-17 ENCOUNTER — Ambulatory Visit: Admitting: Urology

## 2024-07-17 ENCOUNTER — Inpatient Hospital Stay

## 2024-07-17 ENCOUNTER — Inpatient Hospital Stay (HOSPITAL_BASED_OUTPATIENT_CLINIC_OR_DEPARTMENT_OTHER)

## 2024-07-17 VITALS — BP 143/77 | HR 85 | Temp 97.5°F | Resp 20 | Wt 203.6 lb

## 2024-07-17 DIAGNOSIS — I10 Essential (primary) hypertension: Secondary | ICD-10-CM | POA: Diagnosis not present

## 2024-07-17 DIAGNOSIS — Z800 Family history of malignant neoplasm of digestive organs: Secondary | ICD-10-CM | POA: Diagnosis not present

## 2024-07-17 DIAGNOSIS — G4730 Sleep apnea, unspecified: Secondary | ICD-10-CM | POA: Diagnosis not present

## 2024-07-17 DIAGNOSIS — M129 Arthropathy, unspecified: Secondary | ICD-10-CM | POA: Diagnosis not present

## 2024-07-17 DIAGNOSIS — C61 Malignant neoplasm of prostate: Secondary | ICD-10-CM | POA: Diagnosis not present

## 2024-07-17 DIAGNOSIS — C641 Malignant neoplasm of right kidney, except renal pelvis: Secondary | ICD-10-CM

## 2024-07-17 DIAGNOSIS — Z7952 Long term (current) use of systemic steroids: Secondary | ICD-10-CM | POA: Diagnosis not present

## 2024-07-17 DIAGNOSIS — G4733 Obstructive sleep apnea (adult) (pediatric): Secondary | ICD-10-CM | POA: Diagnosis not present

## 2024-07-17 DIAGNOSIS — Z79899 Other long term (current) drug therapy: Secondary | ICD-10-CM | POA: Diagnosis not present

## 2024-07-17 DIAGNOSIS — Z905 Acquired absence of kidney: Secondary | ICD-10-CM | POA: Diagnosis not present

## 2024-07-17 DIAGNOSIS — Z860101 Personal history of adenomatous and serrated colon polyps: Secondary | ICD-10-CM | POA: Diagnosis not present

## 2024-07-17 DIAGNOSIS — C7931 Secondary malignant neoplasm of brain: Secondary | ICD-10-CM | POA: Diagnosis not present

## 2024-07-17 DIAGNOSIS — K1379 Other lesions of oral mucosa: Secondary | ICD-10-CM | POA: Diagnosis not present

## 2024-07-17 DIAGNOSIS — Z5112 Encounter for antineoplastic immunotherapy: Secondary | ICD-10-CM | POA: Diagnosis not present

## 2024-07-17 DIAGNOSIS — E785 Hyperlipidemia, unspecified: Secondary | ICD-10-CM | POA: Diagnosis not present

## 2024-07-17 DIAGNOSIS — E119 Type 2 diabetes mellitus without complications: Secondary | ICD-10-CM | POA: Diagnosis not present

## 2024-07-17 DIAGNOSIS — Z8042 Family history of malignant neoplasm of prostate: Secondary | ICD-10-CM | POA: Diagnosis not present

## 2024-07-17 DIAGNOSIS — R21 Rash and other nonspecific skin eruption: Secondary | ICD-10-CM

## 2024-07-17 DIAGNOSIS — C649 Malignant neoplasm of unspecified kidney, except renal pelvis: Secondary | ICD-10-CM

## 2024-07-17 DIAGNOSIS — N529 Male erectile dysfunction, unspecified: Secondary | ICD-10-CM | POA: Diagnosis not present

## 2024-07-17 DIAGNOSIS — R42 Dizziness and giddiness: Secondary | ICD-10-CM | POA: Diagnosis not present

## 2024-07-17 DIAGNOSIS — Z9221 Personal history of antineoplastic chemotherapy: Secondary | ICD-10-CM | POA: Diagnosis not present

## 2024-07-17 DIAGNOSIS — R918 Other nonspecific abnormal finding of lung field: Secondary | ICD-10-CM | POA: Diagnosis not present

## 2024-07-17 LAB — CBC WITH DIFFERENTIAL (CANCER CENTER ONLY)
Abs Immature Granulocytes: 0.11 K/uL — ABNORMAL HIGH (ref 0.00–0.07)
Basophils Absolute: 0 K/uL (ref 0.0–0.1)
Basophils Relative: 0 %
Eosinophils Absolute: 0.1 K/uL (ref 0.0–0.5)
Eosinophils Relative: 1 %
HCT: 39 % (ref 39.0–52.0)
Hemoglobin: 12.4 g/dL — ABNORMAL LOW (ref 13.0–17.0)
Immature Granulocytes: 2 %
Lymphocytes Relative: 11 %
Lymphs Abs: 0.8 K/uL (ref 0.7–4.0)
MCH: 27.7 pg (ref 26.0–34.0)
MCHC: 31.8 g/dL (ref 30.0–36.0)
MCV: 87.1 fL (ref 80.0–100.0)
Monocytes Absolute: 0.6 K/uL (ref 0.1–1.0)
Monocytes Relative: 7 %
Neutro Abs: 5.9 K/uL (ref 1.7–7.7)
Neutrophils Relative %: 79 %
Platelet Count: 141 K/uL — ABNORMAL LOW (ref 150–400)
RBC: 4.48 MIL/uL (ref 4.22–5.81)
RDW: 17.4 % — ABNORMAL HIGH (ref 11.5–15.5)
WBC Count: 7.5 K/uL (ref 4.0–10.5)
nRBC: 0 % (ref 0.0–0.2)

## 2024-07-17 LAB — CMP (CANCER CENTER ONLY)
ALT: 62 U/L — ABNORMAL HIGH (ref 0–44)
AST: 35 U/L (ref 15–41)
Albumin: 3.7 g/dL (ref 3.5–5.0)
Alkaline Phosphatase: 90 U/L (ref 38–126)
Anion gap: 8 (ref 5–15)
BUN: 17 mg/dL (ref 8–23)
CO2: 27 mmol/L (ref 22–32)
Calcium: 8.8 mg/dL — ABNORMAL LOW (ref 8.9–10.3)
Chloride: 102 mmol/L (ref 98–111)
Creatinine: 1.13 mg/dL (ref 0.61–1.24)
GFR, Estimated: >60 mL/min (ref >60)
Glucose, Bld: 273 mg/dL — ABNORMAL HIGH (ref 70–99)
Potassium: 3.5 mmol/L (ref 3.5–5.1)
Sodium: 137 mmol/L (ref 135–145)
Total Bilirubin: 0.4 mg/dL (ref 0.0–1.2)
Total Protein: 6.7 g/dL (ref 6.5–8.1)

## 2024-07-17 MED ORDER — SODIUM CHLORIDE 0.9 % IV SOLN
900.0000 mg | Freq: Once | INTRAVENOUS | Status: AC
  Administered 2024-07-17: 900 mg via INTRAVENOUS
  Filled 2024-07-17: qty 4

## 2024-07-17 MED ORDER — DEXAMETHASONE 0.5 MG/5ML PO SOLN: 0.5000 mg | Freq: Four times a day (QID) | ORAL | 1 refills | Status: DC | PRN

## 2024-07-17 MED ORDER — TRIAMCINOLONE ACETONIDE 0.5 % EX OINT: 1.0000 | TOPICAL_OINTMENT | Freq: Two times a day (BID) | CUTANEOUS | 0 refills | Status: DC

## 2024-07-17 MED ORDER — SODIUM CHLORIDE 0.9 % IV SOLN
INTRAVENOUS | Status: DC
Start: 2024-07-17 — End: 2024-07-17

## 2024-07-17 NOTE — Progress Notes (Signed)
 Ok to increase bevacizumab  to 900mg  based on today's wt = 92kg per Dr Tina

## 2024-07-17 NOTE — Patient Instructions (Signed)
 [facility] - A DEPT OF [facility]  Discharge Instructions: Thank you for choosing [facility] to provide your oncology and hematology care.   If you have a lab appointment with the Cancer Center, please go directly to the Cancer Center and check in at the registration area.   Wear comfortable clothing and clothing appropriate for easy access to any Portacath or PICC line.   We strive to give you quality time with your provider. You may need to reschedule your appointment if you arrive late (15 or more minutes).  Arriving late affects you and other patients whose appointments are after yours.  Also, if you miss three or more appointments without notifying the office, you may be dismissed from the clinic at the provider's discretion.      For prescription refill requests, have your pharmacy contact our office and allow 72 hours for refills to be completed.    Today you received the following chemotherapy and/or immunotherapy agents: bevacizumab      To help prevent nausea and vomiting after your treatment, we encourage you to take your nausea medication as directed.  BELOW ARE SYMPTOMS THAT SHOULD BE REPORTED IMMEDIATELY: *FEVER GREATER THAN 100.4 F (38 C) OR HIGHER *CHILLS OR SWEATING *NAUSEA AND VOMITING THAT IS NOT CONTROLLED WITH YOUR NAUSEA MEDICATION *UNUSUAL SHORTNESS OF BREATH *UNUSUAL BRUISING OR BLEEDING *URINARY PROBLEMS (pain or burning when urinating, or frequent urination) *BOWEL PROBLEMS (unusual diarrhea, constipation, pain near the anus) TENDERNESS IN MOUTH AND THROAT WITH OR WITHOUT PRESENCE OF ULCERS (sore throat, sores in mouth, or a toothache) UNUSUAL RASH, SWELLING OR PAIN  UNUSUAL VAGINAL DISCHARGE OR ITCHING   Items with * indicate a potential emergency and should be followed up as soon as possible or go to the Emergency Department if any problems should occur.  Please show the CHEMOTHERAPY ALERT CARD or  IMMUNOTHERAPY ALERT CARD at check-in to the Emergency Department and triage nurse.  Should you have questions after your visit or need to cancel or reschedule your appointment, please contact [facility] - A DEPT OF [facility]  Dept: [phone]  and follow the prompts.  Office hours are 8:00 a.m. to 4:30 p.m. Monday - Friday. Please note that voicemails left after 4:00 p.m. may not be returned until the following business day.  We are closed weekends and major holidays. You have access to a nurse at all times for urgent questions. Please call the main number to the clinic Dept: [phone] and follow the prompts.   For any non-urgent questions, you may also contact your provider using MyChart. We now offer e-Visits for anyone 81 and older to request care online for non-urgent symptoms. For details visit [email or web address].   Also download the MyChart app! Go to the app store, search "MyChart", open the app, select [facility], and log in with your MyChart username and password.

## 2024-07-31 ENCOUNTER — Inpatient Hospital Stay

## 2024-07-31 ENCOUNTER — Inpatient Hospital Stay (HOSPITAL_BASED_OUTPATIENT_CLINIC_OR_DEPARTMENT_OTHER)

## 2024-07-31 VITALS — BP 142/74 | HR 74 | Temp 97.5°F | Resp 20 | Wt 204.1 lb

## 2024-07-31 DIAGNOSIS — C649 Malignant neoplasm of unspecified kidney, except renal pelvis: Secondary | ICD-10-CM

## 2024-07-31 DIAGNOSIS — Z7952 Long term (current) use of systemic steroids: Secondary | ICD-10-CM | POA: Insufficient documentation

## 2024-07-31 DIAGNOSIS — Z79899 Other long term (current) drug therapy: Secondary | ICD-10-CM | POA: Diagnosis not present

## 2024-07-31 DIAGNOSIS — R21 Rash and other nonspecific skin eruption: Secondary | ICD-10-CM | POA: Diagnosis not present

## 2024-07-31 DIAGNOSIS — N3941 Urge incontinence: Secondary | ICD-10-CM | POA: Diagnosis not present

## 2024-07-31 DIAGNOSIS — Z905 Acquired absence of kidney: Secondary | ICD-10-CM | POA: Insufficient documentation

## 2024-07-31 DIAGNOSIS — I159 Secondary hypertension, unspecified: Secondary | ICD-10-CM | POA: Diagnosis not present

## 2024-07-31 DIAGNOSIS — C641 Malignant neoplasm of right kidney, except renal pelvis: Secondary | ICD-10-CM

## 2024-07-31 DIAGNOSIS — K1379 Other lesions of oral mucosa: Secondary | ICD-10-CM

## 2024-07-31 DIAGNOSIS — Z5112 Encounter for antineoplastic immunotherapy: Secondary | ICD-10-CM | POA: Insufficient documentation

## 2024-07-31 DIAGNOSIS — C7931 Secondary malignant neoplasm of brain: Secondary | ICD-10-CM | POA: Insufficient documentation

## 2024-07-31 DIAGNOSIS — C61 Malignant neoplasm of prostate: Secondary | ICD-10-CM | POA: Insufficient documentation

## 2024-07-31 DIAGNOSIS — Z8546 Personal history of malignant neoplasm of prostate: Secondary | ICD-10-CM | POA: Insufficient documentation

## 2024-07-31 DIAGNOSIS — R519 Headache, unspecified: Secondary | ICD-10-CM

## 2024-07-31 LAB — CBC WITH DIFFERENTIAL (CANCER CENTER ONLY)
Abs Immature Granulocytes: 0.21 K/uL — ABNORMAL HIGH (ref 0.00–0.07)
Basophils Absolute: 0 K/uL (ref 0.0–0.1)
Basophils Relative: 1 %
Eosinophils Absolute: 0 K/uL (ref 0.0–0.5)
Eosinophils Relative: 0 %
HCT: 36.9 % — ABNORMAL LOW (ref 39.0–52.0)
Hemoglobin: 11.9 g/dL — ABNORMAL LOW (ref 13.0–17.0)
Immature Granulocytes: 3 %
Lymphocytes Relative: 15 %
Lymphs Abs: 0.9 K/uL (ref 0.7–4.0)
MCH: 27.5 pg (ref 26.0–34.0)
MCHC: 32.2 g/dL (ref 30.0–36.0)
MCV: 85.2 fL (ref 80.0–100.0)
Monocytes Absolute: 0.5 K/uL (ref 0.1–1.0)
Monocytes Relative: 8 %
Neutro Abs: 4.5 K/uL (ref 1.7–7.7)
Neutrophils Relative %: 73 %
Platelet Count: 183 K/uL (ref 150–400)
RBC: 4.33 MIL/uL (ref 4.22–5.81)
RDW: 16.1 % — ABNORMAL HIGH (ref 11.5–15.5)
WBC Count: 6.2 K/uL (ref 4.0–10.5)
nRBC: 0 % (ref 0.0–0.2)

## 2024-07-31 LAB — CMP (CANCER CENTER ONLY)
ALT: 64 U/L — ABNORMAL HIGH (ref 0–44)
AST: 42 U/L — ABNORMAL HIGH (ref 15–41)
Albumin: 3.7 g/dL (ref 3.5–5.0)
Alkaline Phosphatase: 92 U/L (ref 38–126)
Anion gap: 8 (ref 5–15)
BUN: 24 mg/dL — ABNORMAL HIGH (ref 8–23)
CO2: 28 mmol/L (ref 22–32)
Calcium: 9.3 mg/dL (ref 8.9–10.3)
Chloride: 99 mmol/L (ref 98–111)
Creatinine: 1.4 mg/dL — ABNORMAL HIGH (ref 0.61–1.24)
GFR, Estimated: 52 mL/min — ABNORMAL LOW (ref >60)
Glucose, Bld: 227 mg/dL — ABNORMAL HIGH (ref 70–99)
Potassium: 4.4 mmol/L (ref 3.5–5.1)
Sodium: 135 mmol/L (ref 135–145)
Total Bilirubin: 0.3 mg/dL (ref 0.0–1.2)
Total Protein: 7 g/dL (ref 6.5–8.1)

## 2024-07-31 MED ORDER — DEXAMETHASONE 1 MG PO TABS: 2.0000 mg | ORAL_TABLET | Freq: Every day | ORAL | 0 refills | Status: DC

## 2024-07-31 MED ORDER — SODIUM CHLORIDE 0.9 % IV SOLN: INTRAVENOUS | Status: DC

## 2024-07-31 MED ORDER — SODIUM CHLORIDE 0.9 % IV SOLN
10.0000 mg/kg | Freq: Once | INTRAVENOUS | Status: AC
  Administered 2024-07-31: 900 mg via INTRAVENOUS
  Filled 2024-07-31: qty 4

## 2024-07-31 NOTE — Assessment & Plan Note (Addendum)
 More than 30 lesions on MRI and not a SRS candidate. Patient and wife declined RT to the brain Stable disease on cabo (05-05/2024) but lost QoL and side effects.  Elected to discontinue. Now on bev/everolimus  since 7/24 Repeat MR for more persistent headache

## 2024-07-31 NOTE — Patient Instructions (Signed)
 [facility] - A DEPT OF [facility]  Discharge Instructions: Thank you for choosing [facility] to provide your oncology and hematology care.   If you have a lab appointment with the Cancer Center, please go directly to the Cancer Center and check in at the registration area.   Wear comfortable clothing and clothing appropriate for easy access to any Portacath or PICC line.   We strive to give you quality time with your provider. You may need to reschedule your appointment if you arrive late (15 or more minutes).  Arriving late affects you and other patients whose appointments are after yours.  Also, if you miss three or more appointments without notifying the office, you may be dismissed from the clinic at the provider's discretion.      For prescription refill requests, have your pharmacy contact our office and allow 72 hours for refills to be completed.    Today you received the following chemotherapy and/or immunotherapy agents: bevacizumab      To help prevent nausea and vomiting after your treatment, we encourage you to take your nausea medication as directed.  BELOW ARE SYMPTOMS THAT SHOULD BE REPORTED IMMEDIATELY: *FEVER GREATER THAN 100.4 F (38 C) OR HIGHER *CHILLS OR SWEATING *NAUSEA AND VOMITING THAT IS NOT CONTROLLED WITH YOUR NAUSEA MEDICATION *UNUSUAL SHORTNESS OF BREATH *UNUSUAL BRUISING OR BLEEDING *URINARY PROBLEMS (pain or burning when urinating, or frequent urination) *BOWEL PROBLEMS (unusual diarrhea, constipation, pain near the anus) TENDERNESS IN MOUTH AND THROAT WITH OR WITHOUT PRESENCE OF ULCERS (sore throat, sores in mouth, or a toothache) UNUSUAL RASH, SWELLING OR PAIN  UNUSUAL VAGINAL DISCHARGE OR ITCHING   Items with * indicate a potential emergency and should be followed up as soon as possible or go to the Emergency Department if any problems should occur.  Please show the CHEMOTHERAPY ALERT CARD or  IMMUNOTHERAPY ALERT CARD at check-in to the Emergency Department and triage nurse.  Should you have questions after your visit or need to cancel or reschedule your appointment, please contact [facility] - A DEPT OF [facility]  Dept: [phone]  and follow the prompts.  Office hours are 8:00 a.m. to 4:30 p.m. Monday - Friday. Please note that voicemails left after 4:00 p.m. may not be returned until the following business day.  We are closed weekends and major holidays. You have access to a nurse at all times for urgent questions. Please call the main number to the clinic Dept: [phone] and follow the prompts.   For any non-urgent questions, you may also contact your provider using MyChart. We now offer e-Visits for anyone 81 and older to request care online for non-urgent symptoms. For details visit [email or web address].   Also download the MyChart app! Go to the app store, search "MyChart", open the app, select [facility], and log in with your MyChart username and password.

## 2024-07-31 NOTE — Assessment & Plan Note (Addendum)
 Unable to have liquid dexamethasone  rinse due to backorder Will continue home baking soda rinse

## 2024-07-31 NOTE — Progress Notes (Signed)
 [name]  Patient Care Team: Seabron Lenis, MD as PCP - General (Family Medicine)  [name] is a 78 y.o. male with history of DM, HTN, HLD, OSA, and prostate cancer (T1c GG2 GS 3+4) s/p EBRT alone in 2019 and Right papillary RCC s/p nephrectomy in 10/2022 referred to Medical Oncology Clinic for [facility]. Currently with stage IV RCC. Staging imaging showed brain metastases, and pulmonary nodules suggestive of lung metastases.   Current diagnosis: stage IV RCC with brain metastases Initial diagnosis: 10/2022. pT3cN0M0 G3 right papillary RCC, type 2 Previous Treatment: 10/27/22 Open radical nephrectomy with IVC repair, cardiac bypass for level 3 thrombus at [facility] testing: negative for actionable mutation.   Systemic treatment: 10/2023 first-line treatment cycle 1 day 1 lenvatinib  with pembrolizumab .  04/17/24 2nd line cabozantinib . Difficulty tolerating. Fatigue, loss of appetite, mouth pain, elected to discontinue. Stable disease on interval MRI of brain in July. 06/19/24 3rd line bev/everolimus   Concerning for cancer progression from his symptoms.  Report increased headaches more steadily.  Clinically stable, no focal neurologic symptoms.  Discussed possibility of disease recurrence or progression.  He is not interested in whole brain radiation.  If disease progression, then hospice is recommended.  We discussed about hospice care today.  We discussed goals of care and what could happen if continued to have CNS progression, including seizure, loss of consciousness, strokelike symptoms, sudden death.  We will obtain MRI for evaluation. 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 Patient and wife declined RT to the brain Stable disease on cabo (05-05/2024) but lost QoL and side effects.  Elected to discontinue. Now on bev/everolimus  since 7/24 Repeat MR for more persistent  headache Rash Anti-itch cream kenalog  0.5 % to use twice daily until resolved. Mouth pain Unable to have liquid dexamethasone  rinse due to backorder Will continue home baking soda rinse Persistent headaches Increase dexamethasone  to 2 mg daily.  Refill today. MRI of the brain ordered  Orders Placed This Encounter  Procedures   MR Brain W Wo Contrast    Standing Status:   Future    Expected Date:   08/11/2024    Expiration Date:   07/31/2025    If indicated for the ordered procedure, I authorize the administration of contrast media per Radiology protocol:   Yes    What is the patient's sedation requirement?:   No Sedation    Does the patient have a pacemaker or implanted devices?:   Yes    Use SRS Protocol?:   No    Preferred imaging location?:   [facility] (table limit - 500lbs)     Garrett [name], MD  INTERVAL HISTORY: Patient returns for follow-up.  Reports almost daily headache.  He denies any neurologic changes, visual change, focal weakness.  No other systemic symptoms.  Oncology History  Malignant neoplasm of prostate (HCC)  04/01/2018 Initial Diagnosis   Malignant neoplasm of prostate (HCC)   04/30/2018 Genetic Testing   Multi-Cancer panel (83 genes) @ [facility] - No pathogenic mutations detected Variants of Uncertain Significance in ATM, SDHA and TSC2  Genes Analyzed: 83 genes on [facility]'s Multi-Cancer panel (ALK, APC, ATM, AXIN2, BAP1, BARD1, BLM, BMPR1A, BRCA1, BRCA2, BRIP1, CASR, CDC73, CDH1, CDK4, CDKN1B, CDKN1C, CDKN2A, CEBPA, CHEK2, CTNNA1, DICER1, DIS3L2, EGFR, EPCAM, FH, FLCN, GATA2, GPC3, GREM1, HOXB13, HRAS, KIT, MAX, MEN1, MET, MITF, MLH1, MSH2, MSH3, MSH6, MUTYH, NBN, NF1, NF2, NTHL1,  PALB2, PDGFRA, PHOX2B, PMS2, POLD1, POLE, POT1, PRKAR1A, PTCH1, PTEN, RAD50, RAD51C, RAD51D, RB1, RECQL4, RET, RUNX1, SDHA, SDHAF2, SDHB, SDHC, SDHD, SMAD4, SMARCA4, SMARCB1, SMARCE1, STK11, SUFU, TERC, TERT, TMEM127, TP53, TSC1, TSC2, VHL, WRN, WT1).  UPDATE: ATM [id number].7988T>C  VUS was amended to Likely Benign due to a re-review of the evidence in light of new variant interpretation guidelines and/or new information.   Renal cell carcinoma, right (HCC)  09/29/2022 Imaging   CT chest 1.  No evidence of metastatic disease in the thorax.  2.  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 Consider correlation with genetic testing.  3.  Reference same day abdominal MRI for details regarding the right renal mass and associated tumor thrombus in the right renal vein and IVC    09/29/2022 Imaging   MRI 1.  Right interpolar renal 3.5 cm mass concerning for primary renal malignancy. Upper pole and lower pole tumor is favored to primarily be intravenous tumor thrombus, with intravenous tumor expanding the right renal vein, and the subdiaphragmatic IVC as detailed above. Differential includes renal cell carcinoma (favored) and transitional cell carcinoma.  2.  Please see outside 08/25/2022 CT abdomen and pelvis for evaluation of the collecting system on delayed/urographic phase.  3.  No definite lymphadenopathy.  4.  Signal changes in the L5 superior endplate are favored to reflect a fracture. However, recommend continued attention on follow-up.    10/27/2022 Pathology Results   SPECIMEN    Procedure:    Radical nephrectomy     Specimen Laterality:    Right   TUMOR    Tumor Focality:    Unifocal     Tumor Size:    Greatest Dimension (Centimeters): 8.2 cm     Histologic Type:    Papillary renal cell carcinoma, type 2     Histologic Grade (WHO / ISUP):    G3 (nucleoli conspicuous and eosinophilic at 100x magnification)     Tumor Extent:    Extends into perinephric tissue (beyond renal capsule)     Tumor Extent:    Extends into renal sinus     Tumor Extent:    Extends into major vein (renal vein or its segmental branches, inferior vena cava)     Sarcomatoid Features:    Not identified     Rhabdoid  Features:    Not identified     Tumor Necrosis:    Not identified     Lymphovascular Invasion:    Present   MARGINS    Margin Status:    Invasive carcinoma present at margin       Margin(s) Involved by Invasive Carcinoma:    Renal vein: present in renal vein nephrectomy margin; No tumor seen is separately submitted renal vein segment.   REGIONAL LYMPH NODES     Regional Lymph Node Status:    Not applicable (no regional lymph nodes submitted or found)   PATHOLOGIC STAGE CLASSIFICATION (pTNM, AJCC 8th Edition)     Reporting of pT, pN, and (when applicable) pM categories is based on information available to the pathologist at the time the report is issued. As per the AJCC (Chapter 1, 8th Ed.) it is the managing physician's responsibility to establish the final  pathologic stage based upon all pertinent information, including but potentially not limited to this pathology report.     Primary Tumor (pT):    pT3b     Regional Lymph Nodes (pN):    pN not assigned (  no nodes submitted or found)    03/08/2023 Imaging   CT CAP Status post radical right nephrectomy, IVC repair, and partial right adrenalectomy without evidence of recurrent or metastatic disease.  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  L5 compression deformity without progressive interval height loss  Lungs/Pleura: No large airspace consolidation or pleural effusion. Similar distribution of numerous thin-walled pulmonary cysts throughout the bilateral lungs. Bibasilar atelectasis/scarring. No new pulmonary nodules.    09/13/2023 Imaging   CT CAP 5 mm left lower lobe pulmonary nodule Solid upper lobe pulmonary nodule 3 mm 4 mm right lower lobe nodule Sclerotic focus T1 change from prior likely bone island.  No aggressive lytic lesion.  Multilevel degenerative changes of spine and bilateral shoulder Similar appearance of sclerotic superior endplate deformity at  L5 unchanged dating back to September 2023.  New from April 2019. 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 No pathologically enlarged abdominal or pelvic lymph nodes.   10/08/2023 Initial Diagnosis   Renal cell carcinoma, right (HCC)   11/08/2023 - 04/03/2024 Chemotherapy   Patient is on Treatment Plan : BLADDER Pembrolizumab  (200) q21d     04/09/2024 Imaging   MRI brain: At least 30 enhancing metastases are identified within the supratentorial and infratentorial brain.   04/17/2024 Imaging   CT CAP 1. Status post right nephrectomy and adrenalectomy. 2. Unchanged appearance of soft tissue nodule in the nephrectomy bed overlying the most superior portion of the right psoas, measuring 1.7 x 0.9 cm which remains highly suspicious for locally recurrent malignancy. 3. Multiple small bilateral pulmonary nodules, not significantly changed, measuring up to 0.6 cm, nonspecific. Continued attention on follow-up. 4. No other evidence of lymphadenopathy or metastatic disease in the chest, abdomen, or pelvis. 5. Similar intra and extrahepatic biliary ductal dilatation, common bile duct dilatation measuring up to 1.1 cm in caliber with associated periportal edema. No calculus or other obstruction visible to the ampulla. 6. Unchanged cystic lesion of the dorsal pancreatic body measuring 1.3 x 1.3 cm. No pancreatic ductal dilatation or surrounding inflammatory changes. 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 Musculoskeletal: no edema NEURO: no focal motor/sensory deficits  Relevant data reviewed during this  visit included labs.

## 2024-07-31 NOTE — Assessment & Plan Note (Addendum)
 Anti-itch cream kenalog  0.5 % to use twice daily until resolved.

## 2024-08-01 ENCOUNTER — Other Ambulatory Visit (HOSPITAL_BASED_OUTPATIENT_CLINIC_OR_DEPARTMENT_OTHER): Payer: Self-pay

## 2024-08-01 ENCOUNTER — Other Ambulatory Visit: Payer: Self-pay

## 2024-08-05 ENCOUNTER — Encounter (INDEPENDENT_AMBULATORY_CARE_PROVIDER_SITE_OTHER): Payer: Self-pay

## 2024-08-06 ENCOUNTER — Other Ambulatory Visit: Payer: Self-pay

## 2024-08-06 ENCOUNTER — Other Ambulatory Visit (HOSPITAL_COMMUNITY): Payer: Self-pay

## 2024-08-06 NOTE — Progress Notes (Signed)
 Specialty Pharmacy Refill Coordination Note  MyChart Questionnaire Submission  [name] is a 78 y.o. male contacted today regarding refills of specialty medication(s) Afinitor .  Doses on hand: (Patient-Rptd) 8   Patient requested: (Patient-Rptd) Delivery   Delivery date: 08/08/24  Verified address: [address] [place]  [id number]  Medication will be filled on 08/07/24.

## 2024-08-07 ENCOUNTER — Ambulatory Visit (HOSPITAL_COMMUNITY)
Admission: RE | Admit: 2024-08-07 | Discharge: 2024-08-07 | Disposition: A | Discharge status: Home / Self Care | Source: Ambulatory Visit

## 2024-08-07 ENCOUNTER — Other Ambulatory Visit: Payer: Self-pay

## 2024-08-07 DIAGNOSIS — C649 Malignant neoplasm of unspecified kidney, except renal pelvis: Secondary | ICD-10-CM | POA: Diagnosis not present

## 2024-08-07 DIAGNOSIS — C7931 Secondary malignant neoplasm of brain: Secondary | ICD-10-CM | POA: Diagnosis not present

## 2024-08-07 DIAGNOSIS — R519 Headache, unspecified: Secondary | ICD-10-CM | POA: Diagnosis present

## 2024-08-07 MED ORDER — GADOBUTROL 1 MMOL/ML IV SOLN
9.0000 mL | Freq: Once | INTRAVENOUS | Status: AC | PRN
  Administered 2024-08-07: 9 mL via INTRAVENOUS

## 2024-08-08 ENCOUNTER — Ambulatory Visit: Payer: Self-pay

## 2024-08-14 ENCOUNTER — Inpatient Hospital Stay

## 2024-08-14 ENCOUNTER — Inpatient Hospital Stay (HOSPITAL_BASED_OUTPATIENT_CLINIC_OR_DEPARTMENT_OTHER)

## 2024-08-14 VITALS — BP 144/85 | HR 65 | Temp 97.7°F | Resp 20 | Wt 203.7 lb

## 2024-08-14 VITALS — BP 150/84 | HR 73 | Temp 98.5°F | Resp 17

## 2024-08-14 DIAGNOSIS — C649 Malignant neoplasm of unspecified kidney, except renal pelvis: Secondary | ICD-10-CM

## 2024-08-14 DIAGNOSIS — C641 Malignant neoplasm of right kidney, except renal pelvis: Secondary | ICD-10-CM | POA: Diagnosis not present

## 2024-08-14 DIAGNOSIS — N3941 Urge incontinence: Secondary | ICD-10-CM

## 2024-08-14 DIAGNOSIS — C7931 Secondary malignant neoplasm of brain: Secondary | ICD-10-CM

## 2024-08-14 DIAGNOSIS — R32 Unspecified urinary incontinence: Secondary | ICD-10-CM | POA: Insufficient documentation

## 2024-08-14 DIAGNOSIS — I159 Secondary hypertension, unspecified: Secondary | ICD-10-CM

## 2024-08-14 DIAGNOSIS — R21 Rash and other nonspecific skin eruption: Secondary | ICD-10-CM

## 2024-08-14 DIAGNOSIS — M6281 Muscle weakness (generalized): Secondary | ICD-10-CM | POA: Insufficient documentation

## 2024-08-14 LAB — CMP (CANCER CENTER ONLY)
ALT: 70 U/L — ABNORMAL HIGH (ref 0–44)
AST: 36 U/L (ref 15–41)
Albumin: 3.6 g/dL (ref 3.5–5.0)
Alkaline Phosphatase: 101 U/L (ref 38–126)
Anion gap: 7 (ref 5–15)
BUN: 26 mg/dL — ABNORMAL HIGH (ref 8–23)
CO2: 28 mmol/L (ref 22–32)
Calcium: 9.2 mg/dL (ref 8.9–10.3)
Chloride: 102 mmol/L (ref 98–111)
Creatinine: 1.33 mg/dL — ABNORMAL HIGH (ref 0.61–1.24)
GFR, Estimated: 55 mL/min — ABNORMAL LOW (ref >60)
Glucose, Bld: 260 mg/dL — ABNORMAL HIGH (ref 70–99)
Potassium: 3.9 mmol/L (ref 3.5–5.1)
Sodium: 137 mmol/L (ref 135–145)
Total Bilirubin: 0.3 mg/dL (ref 0.0–1.2)
Total Protein: 6.8 g/dL (ref 6.5–8.1)

## 2024-08-14 LAB — CBC WITH DIFFERENTIAL (CANCER CENTER ONLY)
Abs Immature Granulocytes: 0.66 K/uL — ABNORMAL HIGH (ref 0.00–0.07)
Basophils Absolute: 0 K/uL (ref 0.0–0.1)
Basophils Relative: 0 %
Eosinophils Absolute: 0 K/uL (ref 0.0–0.5)
Eosinophils Relative: 0 %
HCT: 39.2 % (ref 39.0–52.0)
Hemoglobin: 12.6 g/dL — ABNORMAL LOW (ref 13.0–17.0)
Immature Granulocytes: 7 %
Lymphocytes Relative: 13 %
Lymphs Abs: 1.3 K/uL (ref 0.7–4.0)
MCH: 27.5 pg (ref 26.0–34.0)
MCHC: 32.1 g/dL (ref 30.0–36.0)
MCV: 85.6 fL (ref 80.0–100.0)
Monocytes Absolute: 0.7 K/uL (ref 0.1–1.0)
Monocytes Relative: 7 %
Neutro Abs: 7.2 K/uL (ref 1.7–7.7)
Neutrophils Relative %: 73 %
Platelet Count: 163 K/uL (ref 150–400)
RBC: 4.58 MIL/uL (ref 4.22–5.81)
RDW: 16.3 % — ABNORMAL HIGH (ref 11.5–15.5)
WBC Count: 9.9 K/uL (ref 4.0–10.5)
nRBC: 1.4 % — ABNORMAL HIGH (ref 0.0–0.2)

## 2024-08-14 LAB — T4, FREE: Free T4: 0.63 ng/dL (ref 0.61–1.12)

## 2024-08-14 LAB — TOTAL PROTEIN, URINE DIPSTICK: Protein, ur: 100 mg/dL — AB

## 2024-08-14 MED ORDER — SODIUM CHLORIDE 0.9 % IV SOLN
10.0000 mg/kg | Freq: Once | INTRAVENOUS | Status: AC
  Administered 2024-08-14: 900 mg via INTRAVENOUS
  Filled 2024-08-14: qty 32

## 2024-08-14 MED ORDER — SODIUM CHLORIDE 0.9 % IV SOLN: INTRAVENOUS | Status: DC

## 2024-08-14 MED ORDER — DEXAMETHASONE 1 MG PO TABS: 2.0000 mg | ORAL_TABLET | Freq: Every day | ORAL | 0 refills | Status: DC

## 2024-08-14 NOTE — Assessment & Plan Note (Signed)
 Resolved

## 2024-08-14 NOTE — Patient Instructions (Signed)
 [facility] - A DEPT OF [facility]  Discharge Instructions: Thank you for choosing [facility] to provide your oncology and hematology care.   If you have a lab appointment with the Cancer Center, please go directly to the Cancer Center and check in at the registration area.   Wear comfortable clothing and clothing appropriate for easy access to any Portacath or PICC line.   We strive to give you quality time with your provider. You may need to reschedule your appointment if you arrive late (15 or more minutes).  Arriving late affects you and other patients whose appointments are after yours.  Also, if you miss three or more appointments without notifying the office, you may be dismissed from the clinic at the provider's discretion.      For prescription refill requests, have your pharmacy contact our office and allow 72 hours for refills to be completed.    Today you received the following chemotherapy and/or immunotherapy agents: bevacizumab      To help prevent nausea and vomiting after your treatment, we encourage you to take your nausea medication as directed.  BELOW ARE SYMPTOMS THAT SHOULD BE REPORTED IMMEDIATELY: *FEVER GREATER THAN 100.4 F (38 C) OR HIGHER *CHILLS OR SWEATING *NAUSEA AND VOMITING THAT IS NOT CONTROLLED WITH YOUR NAUSEA MEDICATION *UNUSUAL SHORTNESS OF BREATH *UNUSUAL BRUISING OR BLEEDING *URINARY PROBLEMS (pain or burning when urinating, or frequent urination) *BOWEL PROBLEMS (unusual diarrhea, constipation, pain near the anus) TENDERNESS IN MOUTH AND THROAT WITH OR WITHOUT PRESENCE OF ULCERS (sore throat, sores in mouth, or a toothache) UNUSUAL RASH, SWELLING OR PAIN  UNUSUAL VAGINAL DISCHARGE OR ITCHING   Items with * indicate a potential emergency and should be followed up as soon as possible or go to the Emergency Department if any problems should occur.  Please show the CHEMOTHERAPY ALERT CARD or  IMMUNOTHERAPY ALERT CARD at check-in to the Emergency Department and triage nurse.  Should you have questions after your visit or need to cancel or reschedule your appointment, please contact [facility] - A DEPT OF [facility]  Dept: [phone]  and follow the prompts.  Office hours are 8:00 a.m. to 4:30 p.m. Monday - Friday. Please note that voicemails left after 4:00 p.m. may not be returned until the following business day.  We are closed weekends and major holidays. You have access to a nurse at all times for urgent questions. Please call the main number to the clinic Dept: [phone] and follow the prompts.   For any non-urgent questions, you may also contact your provider using MyChart. We now offer e-Visits for anyone 81 and older to request care online for non-urgent symptoms. For details visit [email or web address].   Also download the MyChart app! Go to the app store, search "MyChart", open the app, select [facility], and log in with your MyChart username and password.

## 2024-08-14 NOTE — Assessment & Plan Note (Addendum)
 Continue everolimus  with bevacizumab  Follow-up every 2 weeks with labs. Reduce dex to 1 mg daily

## 2024-08-14 NOTE — Progress Notes (Signed)
 [facility] OFFICE PROGRESS NOTE  Patient Care Team: Seabron Lenis, MD as PCP - General (Family Medicine)  [name] is a 78 y.o. male with history of DM, HTN, HLD, OSA, and prostate cancer (T1c GG2 GS 3+4) s/p EBRT alone in 2019 and Right papillary RCC s/p nephrectomy in 10/2022 referred to Medical Oncology Clinic for [facility]. Currently with stage IV RCC. Staging imaging showed brain metastases, and pulmonary nodules suggestive of lung metastases.   Current diagnosis: stage IV RCC with brain metastases Initial diagnosis: 10/2022. pT3cN0M0 G3 right papillary RCC, type 2 Previous Treatment: 10/27/22 Open radical nephrectomy with IVC repair, cardiac bypass for level 3 thrombus at [facility] testing: negative for actionable mutation.   Systemic treatment: 10/2023 first-line treatment cycle 1 day 1 lenvatinib  with pembrolizumab .  04/17/24 2nd line cabozantinib . Difficulty tolerating. Fatigue, loss of appetite, mouth pain, elected to discontinue. Stable disease on interval MRI of brain in July. 06/19/24 3rd line bev/everolimus  08/07/24 MRI showed interval response with area of stable disease.  Assessment & Plan Renal cell carcinoma, right (HCC) Continue everolimus  with bevacizumab  Follow-up every 2 weeks with labs. Reduce dex to 1 mg daily Urge incontinence of urine BPH. Can resume tamsulosin  nightly Secondary hypertension Report not taking BP consistently at home Monitor BP He has been on amlodipine , lisinopril -hydrochlorothiazide  25 mg daily. Muscle weakness Reduce dex to 1 mg daily Rash Resolved     [name], MD  INTERVAL HISTORY: Patient returns for follow-up.  Overall feeling well.  Reports less headaches and has not using Tylenol  this last week.  Overall tolerating treatment.  No mouth sores or significant pain.  Using over-the-counter mouth rinse with help.  Reports some lower extremity weakness after increased dose dexamethasone .  Some urinary incontinence  after lunch.  He is not using Flomax .  Oncology History  Malignant neoplasm of prostate (HCC)  04/01/2018 Initial Diagnosis   Malignant neoplasm of prostate (HCC)   04/30/2018 Genetic Testing   Multi-Cancer panel (83 genes) @ [facility] - No pathogenic mutations detected Variants of Uncertain Significance in ATM, SDHA and TSC2  Genes Analyzed: 83 genes on [facility]'s Multi-Cancer panel (ALK, APC, ATM, AXIN2, BAP1, BARD1, BLM, BMPR1A, BRCA1, BRCA2, BRIP1, CASR, CDC73, CDH1, CDK4, CDKN1B, CDKN1C, CDKN2A, CEBPA, CHEK2, CTNNA1, DICER1, DIS3L2, EGFR, EPCAM, FH, FLCN, GATA2, GPC3, GREM1, HOXB13, HRAS, KIT, MAX, MEN1, MET, MITF, MLH1, MSH2, MSH3, MSH6, MUTYH, NBN, NF1, NF2, NTHL1, PALB2, PDGFRA, PHOX2B, PMS2, POLD1, POLE, POT1, PRKAR1A, PTCH1, PTEN, RAD50, RAD51C, RAD51D, RB1, RECQL4, RET, RUNX1, SDHA, SDHAF2, SDHB, SDHC, SDHD, SMAD4, SMARCA4, SMARCB1, SMARCE1, STK11, SUFU, TERC, TERT, TMEM127, TP53, TSC1, TSC2, VHL, WRN, WT1).  UPDATE: ATM c.7988T>C VUS was amended to Likely Benign due to a re-review of the evidence in light of new variant interpretation guidelines and/or new information.   Renal cell carcinoma, right (HCC)  09/29/2022 Imaging   CT chest 1.  No evidence of metastatic disease in the thorax.  2.  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 Consider correlation with genetic testing.  3.  Reference same day abdominal MRI for details regarding the right renal mass and associated tumor thrombus in the right renal vein and IVC    09/29/2022 Imaging   MRI 1.  Right interpolar renal 3.5 cm mass concerning for primary renal malignancy. Upper pole and lower pole tumor is favored to primarily be intravenous tumor thrombus, with intravenous tumor expanding the right renal vein, and the subdiaphragmatic IVC as detailed  above. Differential includes renal cell carcinoma (favored) and transitional cell carcinoma.  2.  Please see  outside 08/25/2022 CT abdomen and pelvis for evaluation of the collecting system on delayed/urographic phase.  3.  No definite lymphadenopathy.  4.  Signal changes in the L5 superior endplate are favored to reflect a fracture. However, recommend continued attention on follow-up.    10/27/2022 Pathology Results   SPECIMEN    Procedure:    Radical nephrectomy     Specimen Laterality:    Right   TUMOR    Tumor Focality:    Unifocal     Tumor Size:    Greatest Dimension (Centimeters): 8.2 cm     Histologic Type:    Papillary renal cell carcinoma, type 2     Histologic Grade (WHO / ISUP):    G3 (nucleoli conspicuous and eosinophilic at 100x magnification)     Tumor Extent:    Extends into perinephric tissue (beyond renal capsule)     Tumor Extent:    Extends into renal sinus     Tumor Extent:    Extends into major vein (renal vein or its segmental branches, inferior vena cava)     Sarcomatoid Features:    Not identified     Rhabdoid Features:    Not identified     Tumor Necrosis:    Not identified     Lymphovascular Invasion:    Present   MARGINS    Margin Status:    Invasive carcinoma present at margin       Margin(s) Involved by Invasive Carcinoma:    Renal vein: present in renal vein nephrectomy margin; No tumor seen is separately submitted renal vein segment.   REGIONAL LYMPH NODES     Regional Lymph Node Status:    Not applicable (no regional lymph nodes submitted or found)   PATHOLOGIC STAGE CLASSIFICATION (pTNM, AJCC 8th Edition)     Reporting of pT, pN, and (when applicable) pM categories is based on information available to the pathologist at the time the report is issued. As per the AJCC (Chapter 1, 8th Ed.) it is the managing physician's responsibility to establish the final  pathologic stage based upon all pertinent information, including but potentially not limited to this pathology report.     Primary Tumor (pT):    pT3b     Regional Lymph Nodes (pN):    pN not assigned (no  nodes submitted or found)    03/08/2023 Imaging   CT CAP Status post radical right nephrectomy, IVC repair, and partial right adrenalectomy without evidence of recurrent or metastatic disease.  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  L5 compression deformity without progressive interval height loss  Lungs/Pleura: No large airspace consolidation or pleural effusion. Similar distribution of numerous thin-walled pulmonary cysts throughout the bilateral lungs. Bibasilar atelectasis/scarring. No new pulmonary nodules.    09/13/2023 Imaging   CT CAP 5 mm left lower lobe pulmonary nodule Solid upper lobe pulmonary nodule 3 mm 4 mm right lower lobe nodule Sclerotic focus T1 change from prior likely bone island.  No aggressive lytic lesion.  Multilevel degenerative changes of spine and bilateral shoulder Similar appearance of sclerotic superior endplate deformity at L5 unchanged dating back to September 2023.  New from April 2019. 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 No pathologically enlarged abdominal or pelvic lymph nodes.   10/08/2023 Initial Diagnosis   Renal cell carcinoma, right (HCC)   11/08/2023 - 04/03/2024 Chemotherapy   Patient is on Treatment Plan : BLADDER Pembrolizumab  (200) q21d     04/09/2024 Imaging   MRI brain: At least 30 enhancing metastases are identified within the supratentorial and infratentorial brain.   04/17/2024 Imaging   CT CAP 1. Status post right nephrectomy and adrenalectomy. 2. Unchanged appearance of soft tissue nodule in the nephrectomy bed overlying the most superior portion of the right psoas, measuring 1.7 x 0.9 cm which  remains highly suspicious for locally recurrent malignancy. 3. Multiple small bilateral pulmonary nodules, not significantly changed, measuring up to 0.6 cm, nonspecific. Continued attention on follow-up. 4. No other evidence of lymphadenopathy or metastatic disease in the chest, abdomen, or pelvis. 5. Similar intra and extrahepatic biliary ductal dilatation, common bile duct dilatation measuring up to 1.1 cm in caliber with associated periportal edema. No calculus or other obstruction visible to the ampulla. 6. Unchanged cystic lesion of the dorsal pancreatic body measuring 1.3 x 1.3 cm. No pancreatic ductal dilatation or surrounding inflammatory changes. 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)   06/19/2024 -  Chemotherapy   Patient is on Treatment Plan : BRAIN Bevacizumab  + Everolimus  q28d        PHYSICAL EXAMINATION: ECOG PERFORMANCE STATUS: 1 - Symptomatic but completely ambulatory  Vitals:   08/14/24 1124 08/14/24 1135  BP: (!) 166/95 (!) 144/85  Pulse: 65   Resp: 20   Temp: 97.7 F (36.5 C)   SpO2: 97%    Filed Weights   08/14/24 1124  Weight: 203 lb 11.2 oz (92.4 kg)    GENERAL: alert, no distress and comfortable SKIN: skin color normal  EYES:  sclera clear OROPHARYNX: no exudate  NECK: No palpable mass LYMPH:  no palpable cervical lymphadenopathy  LUNGS: clear to auscultation and percussion with normal breathing effort HEART: regular rate & rhythm  ABDOMEN: abdomen soft, non-tender and nondistended. Musculoskeletal: no edema NEURO: no focal motor/sensory deficits.  Alternating hand movement equal.  Relevant data reviewed during this  visit included labs.

## 2024-08-14 NOTE — Assessment & Plan Note (Addendum)
 Report not taking BP consistently at home Monitor BP He has been on amlodipine , lisinopril -hydrochlorothiazide  25 mg daily.

## 2024-08-14 NOTE — Assessment & Plan Note (Addendum)
 BPH. Can resume tamsulosin  nightly

## 2024-08-14 NOTE — Assessment & Plan Note (Addendum)
Reduce dex to 1 mg daily

## 2024-08-19 ENCOUNTER — Telehealth: Payer: Self-pay | Admitting: *Deleted

## 2024-08-19 NOTE — Telephone Encounter (Signed)
 Wife left a message asking about what could be going on with him. States he is having lightheadedness, hand and leg cramps and is weak in his legs.  ( Leg weakness previously noted at office visit on 9/18. )

## 2024-08-20 ENCOUNTER — Telehealth: Payer: Self-pay | Admitting: *Deleted

## 2024-08-20 NOTE — Telephone Encounter (Signed)
 Please make sure her checking is BP and not too low, esp if he does not drink enough fluid.   Yes, 64 oz per day.   Spoke with wife. BP has been slightly elevated, could not remember numbers, definitely not low. Will continue to encourage fluids

## 2024-08-26 DIAGNOSIS — R809 Proteinuria, unspecified: Secondary | ICD-10-CM | POA: Insufficient documentation

## 2024-08-26 NOTE — Assessment & Plan Note (Addendum)
 Continue everolimus  with bevacizumab  Follow-up every 2 weeks with labs. Reduce dex to 1 mg daily

## 2024-08-26 NOTE — Progress Notes (Unsigned)
 [facility] OFFICE PROGRESS NOTE  Patient Care Team: Garrett Lenis, MD as PCP - General (Family Medicine)  [name] is a 78 y.o. male with history of DM, HTN, HLD, OSA, and prostate cancer (T1c GG2 GS 3+4) s/p EBRT alone in 2019 and Right papillary RCC s/p nephrectomy in 10/2022 referred to Medical Oncology Clinic for [facility]. Currently with stage IV RCC. Staging imaging showed brain metastases, and pulmonary nodules suggestive of lung metastases.    Current diagnosis: stage IV RCC with brain metastases Initial diagnosis: 10/2022. pT3cN0M0 G3 right papillary RCC, type 2 Previous Treatment: 10/27/22 Open radical nephrectomy with IVC repair, cardiac bypass for level 3 thrombus at [facility] testing: negative for actionable mutation.   Systemic treatment: 10/2023 first-line treatment cycle 1 day 1 lenvatinib  with pembrolizumab .  04/17/24 2nd line cabozantinib . Difficulty tolerating. Fatigue, loss of appetite, mouth pain, elected to discontinue. Stable disease on interval MRI of brain in July. 06/19/24 3rd line bev/everolimus  08/07/24 MRI showed interval response with area of stable disease.  Clinical stable without new headaches, focal weakness.  Bilateral weakness most likely from steroid use.  Will start decreasing dose and wean off as able.  Congestion without sinus findings on recent MRI.  Possibly acid reflux and GERD in the setting of steroid use.  Start omeprazole  once daily.  He has not been taking it. Assessment & Plan Renal cell carcinoma, right (HCC) Continue everolimus  with bevacizumab  Follow-up every 2 weeks with labs. 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  Will continue treatment. Thrombocytopenia New. 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 [facility]) MRI of Brain in about first week of Nov Congestion of throat Possibly from acid reflux Patient denies any allergies or sinus issues Omeprazole  20 mg once daily  Orders Placed This Encounter  Procedures   MR Brain W Wo Contrast    Standing Status:   Future    Expected Date:   09/28/2024    Expiration Date:   08/28/2025    If indicated for the ordered procedure, I authorize the administration of contrast media per Radiology protocol:   Yes    What is the patient's sedation requirement?:   No Sedation    Does the patient have a pacemaker or implanted devices?:   No    Use SRS Protocol?:   No    Preferred imaging location?:   [facility] (table limit - 500lbs)   CT CHEST ABDOMEN PELVIS W CONTRAST    Standing Status:   Future    Expected Date:   10/02/2024    Expiration Date:   08/28/2025    If indicated for the ordered procedure, I authorize the administration of contrast media per Radiology protocol:   Yes    Does the patient have a contrast media/X-ray dye allergy?:   No    Preferred imaging location?:   [facility]    If indicated for the ordered procedure, I authorize the administration of oral contrast media per Radiology protocol:   Yes     Garrett [name], MD  INTERVAL HISTORY: Patient returns for follow-up. No new headaches or focal weakness. Report of lower ext weakness bilaterally.  Report of congestion and has to clear his throat.  He denies  any allergy or sinus pain.  No fever or chills.  He is not taking omeprazole .  He denies any trouble urinating  He is having incontinence and has to wear disposable briefs.  Recently changed his brand supposed to have more absorption but started having dryness and penile skin peeling.  He is leaking urine sometimes.  Wife said that he changed back to the previous brand.  Oncology History  Malignant neoplasm of prostate (HCC)  04/01/2018 Initial Diagnosis   Malignant neoplasm of prostate  (HCC)   04/30/2018 Genetic Testing   Multi-Cancer panel (83 genes) @ [facility] - No pathogenic mutations detected Variants of Uncertain Significance in ATM, SDHA and TSC2  Genes Analyzed: 83 genes on [facility]'s Multi-Cancer panel (ALK, APC, ATM, AXIN2, BAP1, BARD1, BLM, BMPR1A, BRCA1, BRCA2, BRIP1, CASR, CDC73, CDH1, CDK4, CDKN1B, CDKN1C, CDKN2A, CEBPA, CHEK2, CTNNA1, DICER1, DIS3L2, EGFR, EPCAM, FH, FLCN, GATA2, GPC3, GREM1, HOXB13, HRAS, KIT, MAX, MEN1, MET, MITF, MLH1, MSH2, MSH3, MSH6, MUTYH, NBN, NF1, NF2, NTHL1, PALB2, PDGFRA, PHOX2B, PMS2, POLD1, POLE, POT1, PRKAR1A, PTCH1, PTEN, RAD50, RAD51C, RAD51D, RB1, RECQL4, RET, RUNX1, SDHA, SDHAF2, SDHB, SDHC, SDHD, SMAD4, SMARCA4, SMARCB1, SMARCE1, STK11, SUFU, TERC, TERT, TMEM127, TP53, TSC1, TSC2, VHL, WRN, WT1).  UPDATE: ATM [id number] VUS was amended to Likely Benign due to a re-review of the evidence in light of new variant interpretation guidelines and/or new information.   Renal cell carcinoma, right (HCC)  09/29/2022 Imaging   CT chest 1.  No evidence of metastatic disease in the thorax.  2.  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 Consider correlation with genetic testing.  3.  Reference same day abdominal MRI for details regarding the right renal mass and associated tumor thrombus in the right renal vein and IVC    09/29/2022 Imaging   MRI 1.  Right interpolar renal 3.5 cm mass concerning for primary renal malignancy. Upper pole and lower pole tumor is favored to primarily be intravenous tumor thrombus, with intravenous tumor expanding the right renal vein, and the subdiaphragmatic IVC as detailed above. Differential includes renal cell carcinoma (favored) and transitional cell carcinoma.  2.  Please see outside 08/25/2022 CT abdomen and pelvis for evaluation of the collecting system on delayed/urographic phase.  3.  No definite lymphadenopathy.  4.  Signal  changes in the L5 superior endplate are favored to reflect a fracture. However, recommend continued attention on follow-up.    10/27/2022 Pathology Results   SPECIMEN    Procedure:    Radical nephrectomy     Specimen Laterality:    Right   TUMOR    Tumor Focality:    Unifocal     Tumor Size:    Greatest Dimension (Centimeters): 8.2 cm     Histologic Type:    Papillary renal cell carcinoma, type 2     Histologic Grade (WHO / ISUP):    G3 (nucleoli conspicuous and eosinophilic at 100x magnification)     Tumor Extent:    Extends into perinephric tissue (beyond renal capsule)     Tumor Extent:    Extends into renal sinus     Tumor Extent:    Extends into major vein (renal vein or its segmental branches, inferior vena cava)     Sarcomatoid Features:    Not identified     Rhabdoid Features:    Not identified     Tumor Necrosis:    Not identified     Lymphovascular Invasion:  Present   MARGINS    Margin Status:    Invasive carcinoma present at margin       Margin(s) Involved by Invasive Carcinoma:    Renal vein: present in renal vein nephrectomy margin; No tumor seen is separately submitted renal vein segment.   REGIONAL LYMPH NODES     Regional Lymph Node Status:    Not applicable (no regional lymph nodes submitted or found)   PATHOLOGIC STAGE CLASSIFICATION (pTNM, AJCC 8th Edition)     Reporting of pT, pN, and (when applicable) pM categories is based on information available to the pathologist at the time the report is issued. As per the AJCC (Chapter 1, 8th Ed.) it is the managing physician's responsibility to establish the final  pathologic stage based upon all pertinent information, including but potentially not limited to this pathology report.     Primary Tumor (pT):    pT3b     Regional Lymph Nodes (pN):    pN not assigned (no nodes submitted or found)    03/08/2023 Imaging   CT CAP Status post radical right nephrectomy, IVC repair, and partial right adrenalectomy without evidence  of recurrent or metastatic disease.  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  L5 compression deformity without progressive interval height loss  Lungs/Pleura: No large airspace consolidation or pleural effusion. Similar distribution of numerous thin-walled pulmonary cysts throughout the bilateral lungs. Bibasilar atelectasis/scarring. No new pulmonary nodules.    09/13/2023 Imaging   CT CAP 5 mm left lower lobe pulmonary nodule Solid upper lobe pulmonary nodule 3 mm 4 mm right lower lobe nodule Sclerotic focus T1 change from prior likely bone island.  No aggressive lytic lesion.  Multilevel degenerative changes of spine and bilateral shoulder Similar appearance of sclerotic superior endplate deformity at L5 unchanged dating back to September 2023.  New from April 2019. 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 No pathologically enlarged abdominal or pelvic lymph nodes.   10/08/2023 Initial Diagnosis   Renal cell carcinoma, right (HCC)   11/08/2023 - 04/03/2024 Chemotherapy   Patient is on Treatment Plan : BLADDER Pembrolizumab  (200) q21d     04/09/2024 Imaging   MRI brain: At least 30 enhancing metastases are identified within the supratentorial and infratentorial brain.   04/17/2024 Imaging   CT CAP 1. Status post right nephrectomy and adrenalectomy. 2. Unchanged appearance of soft tissue nodule in the nephrectomy bed overlying the most superior portion of the right psoas, measuring 1.7 x 0.9 cm which remains highly suspicious for locally recurrent malignancy. 3. Multiple small bilateral pulmonary nodules, not significantly changed, measuring up to 0.6 cm,  nonspecific. Continued attention on follow-up. 4. No other evidence of lymphadenopathy or metastatic disease in the chest, abdomen, or pelvis. 5. Similar intra and extrahepatic biliary ductal dilatation, common bile duct dilatation measuring up to 1.1 cm in caliber with associated periportal edema. No calculus or other obstruction visible to the ampulla. 6. Unchanged cystic lesion of the dorsal pancreatic body measuring 1.3 x 1.3 cm. No pancreatic ductal dilatation or surrounding inflammatory changes. 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     atorvastatin  (LIPITOR) 10 MG tablet Take 10 mg by mouth in the morning.     dexamethasone  (DECADRON ) 1 MG tablet Take 2 tablets (2 mg total) by mouth daily with breakfast. Start 1 mg daily after finish the 2 mg tabs as taper per other prescription. 40 tablet 0   dronabinol  (MARINOL ) 5 MG capsule Take 1 capsule (5 mg total) by mouth 2 (two) times daily before a meal. (Patient not taking: Reported on 08/14/2024) 60 capsule 0   EPINEPHrine  0.3 mg/0.3 mL IJ SOAJ injection Inject 0.3 mg into the muscle as needed for anaphylaxis.     everolimus  (AFINITOR ) 10 MG tablet Take 1 tablet (10 mg total) by mouth daily. 28 tablet 5   finasteride  (PROSCAR ) 5 MG tablet Take 5 mg by mouth in the morning.     Grape Seed Extract 100 MG CAPS Take 300 mg by mouth. 300 mg      latanoprost  (XALATAN ) 0.005 % ophthalmic solution 1 drop at bedtime.     lisinopril -hydrochlorothiazide  (ZESTORETIC ) 20-25 MG tablet 1 tablet Orally Once a day for 90 days     Multiple Vitamins-Minerals (MULTIVITAMIN WITH MINERALS) tablet Take 1 tablet by mouth in the morning.     omeprazole  (PRILOSEC) 20 MG capsule TAKE 1 CAPSULE(20 MG) BY MOUTH DAILY 90 capsule 1   OVER THE COUNTER MEDICATION Take 1 tablet by mouth in the morning. Lion's mane supplement     sildenafil (VIAGRA) 100 MG tablet Take 100 mg by mouth as needed.     tamsulosin  (FLOMAX ) 0.4 MG CAPS capsule Take 0.4 mg by mouth daily after breakfast.      triamcinolone  ointment (KENALOG ) 0.5 % Apply 1 Application topically 2 (two) times daily. 30 g 0   No current facility-administered medications on file prior to visit.     PHYSICAL EXAMINATION: ECOG PERFORMANCE STATUS: 1 - Symptomatic but completely ambulatory  Vitals:   08/28/24 0945 08/28/24 0956  BP: (!) 163/86 (!) 150/93  Pulse: 87   Resp: 20   Temp: 97.7 F (36.5 C)   SpO2: 97%    Filed Weights   08/28/24 0945  Weight: 204 lb (92.5 kg)    GENERAL: alert, no distress and comfortable SKIN: skin color normal  normal breathing effort HEART: regular rate & rhythm  ABDOMEN: abdomen soft, non-tender and nondistended. Musculoskeletal: no edema NEURO: no focal motor/sensory deficits.  Finger-to-nose, alternating hand movement intact bilaterally  Relevant data reviewed during this visit included labs.  New labs ordered.

## 2024-08-26 NOTE — Assessment & Plan Note (Addendum)
 Borderline.  Will continue treatment.

## 2024-08-26 NOTE — Assessment & Plan Note (Addendum)
 Stable Solitary kidney with RCC s/p right nephrectomy  Fluid goal 60-70 oz daily

## 2024-08-28 ENCOUNTER — Inpatient Hospital Stay

## 2024-08-28 ENCOUNTER — Inpatient Hospital Stay (HOSPITAL_BASED_OUTPATIENT_CLINIC_OR_DEPARTMENT_OTHER)

## 2024-08-28 VITALS — BP 150/93 | HR 87 | Temp 97.7°F | Resp 20 | Wt 204.0 lb

## 2024-08-28 DIAGNOSIS — R634 Abnormal weight loss: Secondary | ICD-10-CM | POA: Insufficient documentation

## 2024-08-28 DIAGNOSIS — C649 Malignant neoplasm of unspecified kidney, except renal pelvis: Secondary | ICD-10-CM

## 2024-08-28 DIAGNOSIS — D696 Thrombocytopenia, unspecified: Secondary | ICD-10-CM | POA: Insufficient documentation

## 2024-08-28 DIAGNOSIS — C641 Malignant neoplasm of right kidney, except renal pelvis: Secondary | ICD-10-CM

## 2024-08-28 DIAGNOSIS — K838 Other specified diseases of biliary tract: Secondary | ICD-10-CM | POA: Insufficient documentation

## 2024-08-28 DIAGNOSIS — I9589 Other hypotension: Secondary | ICD-10-CM | POA: Insufficient documentation

## 2024-08-28 DIAGNOSIS — R809 Proteinuria, unspecified: Secondary | ICD-10-CM | POA: Insufficient documentation

## 2024-08-28 DIAGNOSIS — R6889 Other general symptoms and signs: Secondary | ICD-10-CM | POA: Insufficient documentation

## 2024-08-28 DIAGNOSIS — Z79899 Other long term (current) drug therapy: Secondary | ICD-10-CM | POA: Diagnosis not present

## 2024-08-28 DIAGNOSIS — R808 Other proteinuria: Secondary | ICD-10-CM

## 2024-08-28 DIAGNOSIS — D509 Iron deficiency anemia, unspecified: Secondary | ICD-10-CM | POA: Diagnosis not present

## 2024-08-28 DIAGNOSIS — Z5112 Encounter for antineoplastic immunotherapy: Secondary | ICD-10-CM | POA: Insufficient documentation

## 2024-08-28 DIAGNOSIS — Z905 Acquired absence of kidney: Secondary | ICD-10-CM | POA: Diagnosis not present

## 2024-08-28 DIAGNOSIS — Z7952 Long term (current) use of systemic steroids: Secondary | ICD-10-CM | POA: Insufficient documentation

## 2024-08-28 DIAGNOSIS — N1832 Chronic kidney disease, stage 3b: Secondary | ICD-10-CM | POA: Diagnosis not present

## 2024-08-28 DIAGNOSIS — R609 Edema, unspecified: Secondary | ICD-10-CM | POA: Diagnosis not present

## 2024-08-28 DIAGNOSIS — C7931 Secondary malignant neoplasm of brain: Secondary | ICD-10-CM

## 2024-08-28 DIAGNOSIS — E861 Hypovolemia: Secondary | ICD-10-CM | POA: Insufficient documentation

## 2024-08-28 LAB — CBC WITH DIFFERENTIAL (CANCER CENTER ONLY)
Abs Immature Granulocytes: 0.47 K/uL — ABNORMAL HIGH (ref 0.00–0.07)
Basophils Absolute: 0 K/uL (ref 0.0–0.1)
Basophils Relative: 1 %
Eosinophils Absolute: 0.1 K/uL (ref 0.0–0.5)
Eosinophils Relative: 1 %
HCT: 38.8 % — ABNORMAL LOW (ref 39.0–52.0)
Hemoglobin: 12.5 g/dL — ABNORMAL LOW (ref 13.0–17.0)
Immature Granulocytes: 6 %
Lymphocytes Relative: 18 %
Lymphs Abs: 1.4 K/uL (ref 0.7–4.0)
MCH: 26.9 pg (ref 26.0–34.0)
MCHC: 32.2 g/dL (ref 30.0–36.0)
MCV: 83.6 fL (ref 80.0–100.0)
Monocytes Absolute: 0.4 K/uL (ref 0.1–1.0)
Monocytes Relative: 5 %
Neutro Abs: 5.7 K/uL (ref 1.7–7.7)
Neutrophils Relative %: 69 %
Platelet Count: 105 K/uL — ABNORMAL LOW (ref 150–400)
RBC: 4.64 MIL/uL (ref 4.22–5.81)
RDW: 16.1 % — ABNORMAL HIGH (ref 11.5–15.5)
WBC Count: 8.1 K/uL (ref 4.0–10.5)
nRBC: 0.5 % — ABNORMAL HIGH (ref 0.0–0.2)

## 2024-08-28 LAB — CMP (CANCER CENTER ONLY)
ALT: 45 U/L — ABNORMAL HIGH (ref 0–44)
AST: 28 U/L (ref 15–41)
Albumin: 3.6 g/dL (ref 3.5–5.0)
Alkaline Phosphatase: 85 U/L (ref 38–126)
Anion gap: 7 (ref 5–15)
BUN: 26 mg/dL — ABNORMAL HIGH (ref 8–23)
CO2: 29 mmol/L (ref 22–32)
Calcium: 9.4 mg/dL (ref 8.9–10.3)
Chloride: 101 mmol/L (ref 98–111)
Creatinine: 1.4 mg/dL — ABNORMAL HIGH (ref 0.61–1.24)
GFR, Estimated: 52 mL/min — ABNORMAL LOW (ref >60)
Glucose, Bld: 335 mg/dL — ABNORMAL HIGH (ref 70–99)
Potassium: 3.8 mmol/L (ref 3.5–5.1)
Sodium: 137 mmol/L (ref 135–145)
Total Bilirubin: 0.4 mg/dL (ref 0.0–1.2)
Total Protein: 6.6 g/dL (ref 6.5–8.1)

## 2024-08-28 LAB — TOTAL PROTEIN, URINE DIPSTICK: Protein, ur: 100 mg/dL — AB

## 2024-08-28 MED ORDER — SODIUM CHLORIDE 0.9 % IV SOLN: INTRAVENOUS | Status: DC

## 2024-08-28 MED ORDER — SODIUM CHLORIDE 0.9 % IV SOLN
10.0000 mg/kg | Freq: Once | INTRAVENOUS | Status: AC
  Administered 2024-08-28: 900 mg via INTRAVENOUS
  Filled 2024-08-28: qty 4

## 2024-08-28 NOTE — Patient Instructions (Signed)
 Reduce dexamethasone  (steroid) to 1 mg daily for a week, then stop.  Take omeprazole  (Prilosec) 20 mg every morning about 30 minutes before breakfast.  Use baking soda rinse 4-6 times daily for mouth pain.

## 2024-08-28 NOTE — Progress Notes (Signed)
 Okay to treat with urine protein of 100 per Dr. Tina.  Harlene Nasuti, PharmD Oncology Infusion Pharmacist 08/28/2024 12:13 PM

## 2024-08-28 NOTE — Assessment & Plan Note (Signed)
 Possibly from acid reflux Patient denies any allergies or sinus issues Omeprazole  20 mg once daily

## 2024-08-28 NOTE — Assessment & Plan Note (Addendum)
 New. Repeat in 2 weeks Check ferritin, b12, folate

## 2024-08-28 NOTE — Patient Instructions (Signed)
 [facility] - A DEPT OF [facility]  Discharge Instructions: Thank you for choosing [facility] to provide your oncology and hematology care.   If you have a lab appointment with the Cancer Center, please go directly to the Cancer Center and check in at the registration area.   Wear comfortable clothing and clothing appropriate for easy access to any Portacath or PICC line.   We strive to give you quality time with your provider. You may need to reschedule your appointment if you arrive late (15 or more minutes).  Arriving late affects you and other patients whose appointments are after yours.  Also, if you miss three or more appointments without notifying the office, you may be dismissed from the clinic at the provider's discretion.      For prescription refill requests, have your pharmacy contact our office and allow 72 hours for refills to be completed.    Today you received the following chemotherapy and/or immunotherapy agents: Bevacizumab -awwb (MVASI )    To help prevent nausea and vomiting after your treatment, we encourage you to take your nausea medication as directed.  BELOW ARE SYMPTOMS THAT SHOULD BE REPORTED IMMEDIATELY: *FEVER GREATER THAN 100.4 F (38 C) OR HIGHER *CHILLS OR SWEATING *NAUSEA AND VOMITING THAT IS NOT CONTROLLED WITH YOUR NAUSEA MEDICATION *UNUSUAL SHORTNESS OF BREATH *UNUSUAL BRUISING OR BLEEDING *URINARY PROBLEMS (pain or burning when urinating, or frequent urination) *BOWEL PROBLEMS (unusual diarrhea, constipation, pain near the anus) TENDERNESS IN MOUTH AND THROAT WITH OR WITHOUT PRESENCE OF ULCERS (sore throat, sores in mouth, or a toothache) UNUSUAL RASH, SWELLING OR PAIN  UNUSUAL VAGINAL DISCHARGE OR ITCHING   Items with * indicate a potential emergency and should be followed up as soon as possible or go to the Emergency Department if any problems should occur.  Please show the CHEMOTHERAPY ALERT CARD or  IMMUNOTHERAPY ALERT CARD at check-in to the Emergency Department and triage nurse.  Should you have questions after your visit or need to cancel or reschedule your appointment, please contact [facility] - A DEPT OF [facility]  Dept: [phone]  and follow the prompts.  Office hours are 8:00 a.m. to 4:30 p.m. Monday - Friday. Please note that voicemails left after 4:00 p.m. may not be returned until the following business day.  We are closed weekends and major holidays. You have access to a nurse at all times for urgent questions. Please call the main number to the clinic Dept: [phone] and follow the prompts.   For any non-urgent questions, you may also contact your provider using MyChart. We now offer e-Visits for anyone 35 and older to request care online for non-urgent symptoms. For details visit [email or web address].   Also download the MyChart app! Go to the app store, search MyChart, open the app, select [place], and log in with your MyChart username and password.

## 2024-08-28 NOTE — Assessment & Plan Note (Addendum)
 MRI of Brain in about first week of Nov

## 2024-09-01 ENCOUNTER — Emergency Department (HOSPITAL_BASED_OUTPATIENT_CLINIC_OR_DEPARTMENT_OTHER)
Admission: EM | Admit: 2024-09-01 | Discharge: 2024-09-01 | Disposition: A | Discharge status: Home / Self Care | Attending: Emergency Medicine | Admitting: Emergency Medicine

## 2024-09-01 ENCOUNTER — Emergency Department (HOSPITAL_BASED_OUTPATIENT_CLINIC_OR_DEPARTMENT_OTHER)

## 2024-09-01 ENCOUNTER — Other Ambulatory Visit: Payer: Self-pay

## 2024-09-01 ENCOUNTER — Other Ambulatory Visit (HOSPITAL_BASED_OUTPATIENT_CLINIC_OR_DEPARTMENT_OTHER): Payer: Self-pay

## 2024-09-01 ENCOUNTER — Other Ambulatory Visit (HOSPITAL_COMMUNITY): Payer: Self-pay

## 2024-09-01 DIAGNOSIS — I10 Essential (primary) hypertension: Secondary | ICD-10-CM | POA: Diagnosis not present

## 2024-09-01 DIAGNOSIS — Z8546 Personal history of malignant neoplasm of prostate: Secondary | ICD-10-CM | POA: Diagnosis not present

## 2024-09-01 DIAGNOSIS — Z85841 Personal history of malignant neoplasm of brain: Secondary | ICD-10-CM | POA: Insufficient documentation

## 2024-09-01 DIAGNOSIS — E119 Type 2 diabetes mellitus without complications: Secondary | ICD-10-CM | POA: Insufficient documentation

## 2024-09-01 DIAGNOSIS — G939 Disorder of brain, unspecified: Secondary | ICD-10-CM

## 2024-09-01 DIAGNOSIS — R5383 Other fatigue: Secondary | ICD-10-CM | POA: Insufficient documentation

## 2024-09-01 DIAGNOSIS — R21 Rash and other nonspecific skin eruption: Secondary | ICD-10-CM | POA: Insufficient documentation

## 2024-09-01 DIAGNOSIS — Z79899 Other long term (current) drug therapy: Secondary | ICD-10-CM | POA: Insufficient documentation

## 2024-09-01 LAB — URINALYSIS, ROUTINE W REFLEX MICROSCOPIC
Bacteria, UA: NONE SEEN
Bilirubin Urine: NEGATIVE
Glucose, UA: 500 mg/dL — AB
Ketones, ur: NEGATIVE mg/dL
Leukocytes,Ua: NEGATIVE
Nitrite: NEGATIVE
Protein, ur: 100 mg/dL — AB
RBC / HPF: >50 RBC/hpf (ref 0–5)
Specific Gravity, Urine: 1.02 (ref 1.005–1.030)
pH: 6 (ref 5.0–8.0)

## 2024-09-01 LAB — COMPREHENSIVE METABOLIC PANEL WITH GFR
ALT: 72 U/L — ABNORMAL HIGH (ref 0–44)
AST: 57 U/L — ABNORMAL HIGH (ref 15–41)
Albumin: 3.8 g/dL (ref 3.5–5.0)
Alkaline Phosphatase: 113 U/L (ref 38–126)
Anion gap: 11 (ref 5–15)
BUN: 22 mg/dL (ref 8–23)
CO2: 29 mmol/L (ref 22–32)
Calcium: 9.8 mg/dL (ref 8.9–10.3)
Chloride: 95 mmol/L — ABNORMAL LOW (ref 98–111)
Creatinine, Ser: 1.67 mg/dL — ABNORMAL HIGH (ref 0.61–1.24)
GFR, Estimated: 42 mL/min — ABNORMAL LOW (ref >60)
Glucose, Bld: 240 mg/dL — ABNORMAL HIGH (ref 70–99)
Potassium: 3.4 mmol/L — ABNORMAL LOW (ref 3.5–5.1)
Sodium: 135 mmol/L (ref 135–145)
Total Bilirubin: 0.7 mg/dL (ref 0.0–1.2)
Total Protein: 7.1 g/dL (ref 6.5–8.1)

## 2024-09-01 LAB — CBC WITH DIFFERENTIAL/PLATELET
Abs Immature Granulocytes: 0.33 K/uL — ABNORMAL HIGH (ref 0.00–0.07)
Basophils Absolute: 0 K/uL (ref 0.0–0.1)
Basophils Relative: 1 %
Eosinophils Absolute: 0 K/uL (ref 0.0–0.5)
Eosinophils Relative: 1 %
HCT: 40.1 % (ref 39.0–52.0)
Hemoglobin: 12.8 g/dL — ABNORMAL LOW (ref 13.0–17.0)
Immature Granulocytes: 5 %
Lymphocytes Relative: 17 %
Lymphs Abs: 1.2 K/uL (ref 0.7–4.0)
MCH: 26.8 pg (ref 26.0–34.0)
MCHC: 31.9 g/dL (ref 30.0–36.0)
MCV: 83.9 fL (ref 80.0–100.0)
Monocytes Absolute: 0.4 K/uL (ref 0.1–1.0)
Monocytes Relative: 5 %
Neutro Abs: 5.2 K/uL (ref 1.7–7.7)
Neutrophils Relative %: 71 %
Platelets: 71 K/uL — ABNORMAL LOW (ref 150–400)
RBC: 4.78 MIL/uL (ref 4.22–5.81)
RDW: 15.9 % — ABNORMAL HIGH (ref 11.5–15.5)
WBC: 7.1 K/uL (ref 4.0–10.5)
nRBC: 0.3 % — ABNORMAL HIGH (ref 0.0–0.2)

## 2024-09-01 LAB — RESP PANEL BY RT-PCR (RSV, FLU A&B, COVID)  RVPGX2
Influenza A by PCR: NEGATIVE
Influenza B by PCR: NEGATIVE
Resp Syncytial Virus by PCR: NEGATIVE
SARS Coronavirus 2 by RT PCR: NEGATIVE

## 2024-09-01 LAB — LIPASE, BLOOD: Lipase: 34 U/L (ref 11–51)

## 2024-09-01 MED ORDER — SODIUM CHLORIDE 0.9 % IV BOLUS
1000.0000 mL | Freq: Once | INTRAVENOUS | Status: AC
  Administered 2024-09-01: 1000 mL via INTRAVENOUS

## 2024-09-01 MED ORDER — NYSTATIN 100000 UNIT/GM EX CREA
TOPICAL_CREAM | CUTANEOUS | 0 refills | Status: DC
  Filled 2024-09-01: qty 30, 30d supply, fill #0

## 2024-09-01 NOTE — ED Notes (Signed)
 Pt d/c instructions, medications, and follow-up care reviewed with pt. Pt verbalized understanding and had no further questions at time of d/c. Pt CA&Ox4 and in NAD at time of d/c

## 2024-09-01 NOTE — ED Notes (Signed)
 1 attempt at in and out catheter attempted assisted by [facility], RN. Unable to pass catheter to bladder. Small amount of bleeding noted on catheter upon removal.

## 2024-09-01 NOTE — ED Notes (Signed)
 Patient remains in restroom. Checked on patient who states no needs at this time.

## 2024-09-01 NOTE — ED Provider Notes (Signed)
 [name] AT [facility] Provider Note   CSN: [id number] Arrival date & time: 09/01/24  9184     Patient presents with: Fatigue   [name] is a 78 y.o. male.   Patient here with generalized fatigue last for 5 days.  Some irritation to his genitals from depends.  He is undergoing cancer treatment for brain cancer.  He has had dehydration with this before.  Feels like similar.  He has a history of high cholesterol hypertension prostate cancer with urinary tract symptoms in the past as well.  He is having some pain with urination.  History of diabetes.  Denies any chest pain abdominal pain nausea vomit diarrhea.  Denies any ongoing fever or chills.  He has somewhat chronic headaches.  The history is provided by the patient and a caregiver.       Prior to Admission medications   Medication Sig Start Date End Date Taking? Authorizing Provider  nystatin cream (MYCOSTATIN) Apply to affected area 2 times daily 09/01/24  Yes Jun Rightmyer, DO  amLODipine  (NORVASC ) 5 MG tablet Take 2 tablets (10 mg total) by mouth daily. 12/14/23   Samtani, Jai-Gurmukh, MD  atorvastatin  (LIPITOR) 10 MG tablet Take 10 mg by mouth in the morning.    [provider]  dexamethasone  (DECADRON ) 1 MG tablet Take 2 tablets (2 mg total) by mouth daily with breakfast. Start 1 mg daily after finish the 2 mg tabs as taper per other prescription. 08/14/24   Tina Pauletta BROCKS, MD  dronabinol  (MARINOL ) 5 MG capsule Take 1 capsule (5 mg total) by mouth 2 (two) times daily before a meal. Patient not taking: Reported on 08/14/2024 04/11/24   Tina Pauletta BROCKS, MD  EPINEPHrine  0.3 mg/0.3 mL IJ SOAJ injection Inject 0.3 mg into the muscle as needed for anaphylaxis.    [provider]  everolimus  (AFINITOR ) 10 MG tablet Take 1 tablet (10 mg total) by mouth daily. 06/05/24   Tina Pauletta BROCKS, MD  finasteride  (PROSCAR ) 5 MG tablet Take 5 mg by mouth in the morning. 03/15/20   [provider]  Grape Seed Extract 100 MG CAPS Take 300 mg by mouth. 300 mg    [provider]  latanoprost  (XALATAN ) 0.005 % ophthalmic solution 1 drop at bedtime. 01/23/22   [provider]  lisinopril -hydrochlorothiazide  (ZESTORETIC ) 20-25 MG tablet 1 tablet Orally Once a day for 90 days    [provider]  Multiple Vitamins-Minerals (MULTIVITAMIN WITH MINERALS) tablet Take 1 tablet by mouth in the morning.    [provider]  omeprazole  (PRILOSEC) 20 MG capsule TAKE 1 CAPSULE(20 MG) BY MOUTH DAILY 02/28/24   Tina Pauletta BROCKS, MD  OVER THE COUNTER MEDICATION Take 1 tablet by mouth in the morning. Lion's mane supplement    [provider]  sildenafil (VIAGRA) 100 MG tablet Take 100 mg by mouth as needed. 02/15/24   [provider]  tamsulosin  (FLOMAX ) 0.4 MG CAPS capsule Take 0.4 mg by mouth daily after breakfast.     [provider]  triamcinolone  ointment (KENALOG ) 0.5 % Apply 1 Application topically 2 (two) times daily. 07/17/24   Tina Pauletta BROCKS, MD    Allergies: Bee venom, Aspirin, Tadalafil, Triamterene-hctz, and Penicillins    Review of Systems  Updated Vital Signs BP 121/80   Pulse 87   Temp 98.4 F (36.9 C)   Resp 20   SpO2 97%   Physical Exam Vitals and nursing note reviewed. Exam conducted with  a chaperone present.  Constitutional:      General: He is not in acute distress.    Appearance: He is well-developed. He is not ill-appearing.  HENT:     Head: Normocephalic and atraumatic.     Mouth/Throat:     Mouth: Mucous membranes are moist.  Eyes:     Extraocular Movements: Extraocular movements intact.     Conjunctiva/sclera: Conjunctivae normal.     Pupils: Pupils are equal, round, and reactive to light.  Cardiovascular:     Rate and Rhythm: Normal rate and regular rhythm.     Pulses: Normal pulses.     Heart sounds: No murmur heard. Pulmonary:     Effort: Pulmonary effort is normal. No respiratory distress.      Breath sounds: Normal breath sounds.  Abdominal:     Palpations: Abdomen is soft.     Tenderness: There is no abdominal tenderness.  Genitourinary:    Comments: irritation to his testicles but there is no major fluctuance redness swelling or crepitus especially in the perineum Musculoskeletal:        General: No swelling.     Cervical back: Normal range of motion and neck supple.  Skin:    General: Skin is warm and dry.     Capillary Refill: Capillary refill takes less than 2 seconds.  Neurological:     General: No focal deficit present.     Mental Status: He is alert and oriented to person, place, and time.     Cranial Nerves: No cranial nerve deficit.     Sensory: No sensory deficit.     Motor: No weakness.     Coordination: Coordination normal.  Psychiatric:        Mood and Affect: Mood normal.     (all labs ordered are listed, but only abnormal results are displayed) Labs Reviewed  CBC WITH DIFFERENTIAL/PLATELET - Abnormal; Notable for the following components:      Result Value   Hemoglobin 12.8 (*)    RDW 15.9 (*)    Platelets 71 (*)    nRBC 0.3 (*)    Abs Immature Granulocytes 0.33 (*)    All other components within normal limits  COMPREHENSIVE METABOLIC PANEL WITH GFR - Abnormal; Notable for the following components:   Potassium 3.4 (*)    Chloride 95 (*)    Glucose, Bld 240 (*)    Creatinine, Ser 1.67 (*)    AST 57 (*)    ALT 72 (*)    GFR, Estimated 42 (*)    All other components within normal limits  URINALYSIS, ROUTINE W REFLEX MICROSCOPIC - Abnormal; Notable for the following components:   Glucose, UA 500 (*)    Hgb urine dipstick LARGE (*)    Protein, ur 100 (*)    All other components within normal limits  RESP PANEL BY RT-PCR (RSV, FLU A&B, COVID)  RVPGX2  LIPASE, BLOOD    EKG: EKG Interpretation Date/Time:  Monday September 01 2024 08:59:08 EDT Ventricular Rate:  100 PR Interval:  153 QRS Duration:  86 QT Interval:  321 QTC  Calculation: 414 R Axis:   50  Text Interpretation: Sinus tachycardia Confirmed by Ruthe Cornet [phone]) on 09/01/2024 9:28:03 AM  Radiology: DG Chest Portable 1 View Result Date: 09/01/2024 CLINICAL DATA:  Fatigue EXAM: PORTABLE CHEST 1 VIEW COMPARISON:  December 13, 2023 FINDINGS: Previous median sternotomy. The heart size is normal. There is no pleural effusion. The lungs are clear. No fracture or bone lesion identified.  IMPRESSION: No acute disease Electronically Signed   By: Nancyann Burns M.D.   On: 09/01/2024 09:57   CT Head Wo Contrast Result Date: 09/01/2024 CLINICAL DATA:  Headache and fatigue EXAM: CT HEAD WITHOUT CONTRAST TECHNIQUE: Contiguous axial images were obtained from the base of the skull through the vertex without intravenous contrast. RADIATION DOSE REDUCTION: This exam was performed according to the departmental dose-optimization program which includes automated exposure control, adjustment of the mA and/or kV according to patient size and/or use of iterative reconstruction technique. COMPARISON:  Brain MRI August 07, 2004 FINDINGS: CT HEAD: Small cystic lesions are noted in the medial left frontal lobe and right parietal lobe similar to the previous brain MRI. There is no hemorrhage. No acute ischemic changes. No new mass lesion identified. The ventricles are normal. Skull/sinuses/orbits: No significant abnormality. IMPRESSION: Small cystic lesions are noted in the medial left frontal lobe and right parietal lobe similar to the brain MRI from August 07, 2024. There is no hemorrhage, acute infarct or new mass identified. Electronically Signed   By: Nancyann Burns M.D.   On: 09/01/2024 09:55     Procedures   Medications Ordered in the ED  sodium chloride  0.9 % bolus 1,000 mL (0 mLs Intravenous Stopped 09/01/24 1142)                                    Medical Decision Making Amount and/or Complexity of Data Reviewed Labs: ordered. Radiology:  ordered.  Risk Prescription drug management.   [name] is here with generalized fatigue last few days.  History of prostate cancer brain cancer.  Normal vitals.  No fever.  He is on chemotherapy./Maintenance meds.  History of hypertension high cholesterol.  Denies any fevers or chills.  Discussed some irritation to his testicles which clinically looks like some irritation from wearing depends.  Looks like he is often in [employer] which is likely causing irritation to the skin.  He is not with any signs to suggest Fournier's gangrene or major infectious process.  Is no fluctuance.  There is no irritation to the perineum.  He has been using moisturizing cream and Vaseline with some improvement.  He is having pain with urination and overall we will evaluate for infectious process dehydration and electrolyte abnormality.  Not having any chest pain cough shortness of breath.  I doubt PE ACS.  He is neurologically intact.  Will get a head CT given his brain cancer history.  He does not look like he has a fever.  Vital signs are reassuring.  I doubt septic process but will evaluate labs.  Will give fluid bolus and reevaluate.  Lab work is overall unremarkable.  No significant leukocytosis anemia or electrolyte abnormality.  COVID test negative.  Urinalysis negative for infection.  Head CT is unremarkable.  Chest x-ray no evidence of pneumonia pneumothorax.  EKG shows no ischemic changes.  Overall lab work images and EKG per my review and interpretation are unremarkable.  Suspect general fatigue likely more of a chronic process.  Will prescribe nystatin powder/cream for fungal/irritation of his scrotal area.  He understands return if symptoms worsen.  Discharge.  Follow-up with primary care and oncology.  This chart was dictated using voice recognition software.  Despite best efforts to proofread,  errors can occur which can change the documentation meaning.      Final diagnoses:  Other fatigue   Scrotal rash  ED Discharge Orders          Ordered    nystatin cream (MYCOSTATIN)        09/01/24 1228               Ruthe Cornet, DO 09/01/24 1229

## 2024-09-01 NOTE — Discharge Instructions (Addendum)
 Use nystatin/antifungal cream to your scrotal area twice daily follow-up with your primary care doctor.  Return if symptoms worsen.

## 2024-09-01 NOTE — ED Notes (Signed)
 One set of blood cultures and lactic acid level drawn with initial IV stick and sent to lab to hold per ED provider request.

## 2024-09-01 NOTE — ED Triage Notes (Signed)
 Pt c/o fatigue x 5 days. Also reports rash on genitals. Pt also complaining about mouth irritation. Pt currently undergoing cancer tx.

## 2024-09-01 NOTE — ED Notes (Signed)
 Patient assisted to restroom per patient request.

## 2024-09-02 ENCOUNTER — Other Ambulatory Visit (HOSPITAL_COMMUNITY): Payer: Self-pay

## 2024-09-02 ENCOUNTER — Other Ambulatory Visit: Payer: Self-pay | Admitting: *Deleted

## 2024-09-02 ENCOUNTER — Telehealth: Payer: Self-pay | Admitting: *Deleted

## 2024-09-02 ENCOUNTER — Encounter (INDEPENDENT_AMBULATORY_CARE_PROVIDER_SITE_OTHER): Payer: Self-pay

## 2024-09-02 ENCOUNTER — Other Ambulatory Visit: Payer: Self-pay

## 2024-09-02 DIAGNOSIS — C641 Malignant neoplasm of right kidney, except renal pelvis: Secondary | ICD-10-CM

## 2024-09-02 NOTE — Progress Notes (Signed)
 Specialty Pharmacy Refill Coordination Note  MyChart Questionnaire Submission  [name] is a 78 y.o. male contacted today regarding refills of specialty medication(s) Afinitor .  Doses on hand: (Patient-Rptd) 12   Patient requested: (Patient-Rptd) Delivery   Delivery date: 09/04/24  Verified address: [address] [place] [address]  Medication will be filled on 09/03/24.

## 2024-09-02 NOTE — Telephone Encounter (Signed)
 Wife states she is needing help dealing with [name]. He has been very weak, is not drinking like he should, he does not check his blood pressure as directed and he is still working He went to the ED last night and got IVF. She is feeling overwhelmed and is asking for a Palliative Care Consult. She is familiar with Palliative Care.

## 2024-09-02 NOTE — Telephone Encounter (Signed)
Referral called to [facility].  

## 2024-09-03 ENCOUNTER — Other Ambulatory Visit: Payer: Self-pay | Admitting: *Deleted

## 2024-09-03 ENCOUNTER — Telehealth: Payer: Self-pay | Admitting: Nurse Practitioner

## 2024-09-03 DIAGNOSIS — C641 Malignant neoplasm of right kidney, except renal pelvis: Secondary | ICD-10-CM

## 2024-09-03 NOTE — Telephone Encounter (Signed)
 Scheduled patient for new palliative appointment. Called and spoke with the patient he is aware.

## 2024-09-04 ENCOUNTER — Other Ambulatory Visit: Payer: Self-pay

## 2024-09-04 DIAGNOSIS — C641 Malignant neoplasm of right kidney, except renal pelvis: Secondary | ICD-10-CM

## 2024-09-04 NOTE — Progress Notes (Signed)
 Received call from pt wife, earnie, stating she does not need palliative care but needs help with the pt. RN attempted to elicit goals of care from pt wife. After significant conversation pt wife wishes for individual counseling for herself to help her provider better care for the pt. RN placed referral for LCSW per Dr.Chang and palliative referral canceled. LCSW made aware of convo. Pt wife in agreement with plan.

## 2024-09-05 ENCOUNTER — Telehealth: Payer: Self-pay

## 2024-09-05 NOTE — Telephone Encounter (Signed)
 [facility] Clinical Social Work  Clinical Social Work was referred by Government social research officer for caregiver/family issues.  Clinical Social Worker contacted caregiver by phone to offer support and assess for needs. Patient's spouse desiring caregiver counseling for support with adjustment to role with husband. Patient's spouse needs counseling services on Thursdays during patient's infusion appointments. Primary CSW off-site on preferred date. CSW opened request up to CSW team, CSW will contact spouse once information is gained. Presently, spiritual care and support groups are not desired.    Lizbeth Sprague, LCSW  Clinical Social Worker [facility]

## 2024-09-08 ENCOUNTER — Telehealth: Payer: Self-pay

## 2024-09-08 NOTE — Telephone Encounter (Signed)
 I called and spoke to patient and wife.  Reports that he has not been feeling as well over the last week.  Decreased appetite, a few intermittent headaches.  Also reported not drinking and eating as well.  He does not feel thirsty so does not drink much.  He was in the emergency room a week ago.  Discussed importance of increased fluid intake.  Will hold Afinitor  for 3 days until he sees me.  It has side effect of dec appetite and taste. See if some of the symptom may be related to side effect of the treatment.  She asked about palliative care consult.  She was upset that scheduled to talk about pain test patient has no pain.  We discussed palliative care may need to discuss advance care planning.  In the event that he stopped responding to the treatment, or continued progression, having a plan in place for transition to hospice is ideal.  If treatment continue to work well, he will also have a plan in place for future as well.  This will not interfere with her treatment.  They understands.  They would like to see palliative care for consult for advance care planning.

## 2024-09-09 ENCOUNTER — Telehealth: Payer: Self-pay | Admitting: Nurse Practitioner

## 2024-09-09 ENCOUNTER — Telehealth: Payer: Self-pay

## 2024-09-09 NOTE — Telephone Encounter (Signed)
 Scheduled patient for new palliative appointment. Called and spoke with the patients wife, she is aware.

## 2024-09-09 NOTE — Telephone Encounter (Signed)
 Called to speak with patient's wife to see if I can help her with her questions. She requests that I call back in 20 minutes. Arland Legions BSN RN

## 2024-09-09 NOTE — Telephone Encounter (Signed)
 Spoke at length with the wife. We discussed attempts to get him to eat by giving him small portions throughout the day. Asking him what he would like but make it small portions so he is not overwhelmed by the food. Also, with liquids give him small amounts at a time so again he is not overwhelmed thinking he has to drink all of a large glass of fluid. Remind him that this is a temporary situation that will last as long as the chemotherapy and doses can be adjusted as needed. Explain that some of his dizziness and headaches may come from dehydration and by drinking more he may feel better. We discussed that she may have to be forceful in her attempts to make him eat and drink. She VU to all of our discussion topics. I again reminder her she can call at anytime if she has issues or just has more questions. Arland Legions BSN RN

## 2024-09-10 NOTE — Assessment & Plan Note (Signed)
 MRI of Brain in about first week of Nov Holding bev/everolimus  this week Do not resume everolimus  until return next week Repeat lab next week Avoid driving and use of heavy machinery

## 2024-09-10 NOTE — Progress Notes (Unsigned)
 [facility] OFFICE PROGRESS NOTE  Patient Care Team: Seabron Lenis, MD as PCP - General (Family Medicine)  [name] is a 78 y.o. male with history of DM, HTN, HLD, OSA, and prostate cancer (T1c GG2 GS 3+4) s/p EBRT alone in 2019 and Right papillary RCC s/p nephrectomy in 10/2022 referred to Medical Oncology Clinic for [facility]. Currently with stage IV RCC. Staging imaging showed brain metastases, and pulmonary nodules suggestive of lung metastases.    Current diagnosis: stage IV RCC with brain metastases Initial diagnosis: 10/2022. [id number] G3 right papillary RCC, type 2 Previous Treatment: 10/27/22 Open radical nephrectomy with IVC repair, cardiac bypass for level 3 thrombus at [facility] testing: negative for actionable mutation.  Systemic treatment: 10/2023 first-line treatment cycle 1 day 1 lenvatinib  with pembrolizumab .  04/17/24 2nd line cabozantinib . Difficulty tolerating. Fatigue, loss of appetite, mouth pain, elected to discontinue. Stable disease on interval MRI of brain in July. 06/19/24 3rd line bev/everolimus  08/07/24 MRI showed interval response with area of stable disease.   Report of new fever, short of breath.  Also he has not been eating or drinking much.  He is still taking his blood pressure medication.  He is slightly hypotensive today.  Discussed differential diagnosis today.  Will give 1 L out of IV fluid.  If he feels better afterwards, he may go home and increase oral intake as able.  Continue to hold cancer medicine.  If he has worsening shortness of breath, difficulty breathing, chest pain, report to ED immediately.  We discussed short-term follow-up next week.  If he has improvement after holding everolimus , we can reassess next week.  If he has no improvement after holding everolimus  over the next week, or signs of continued declining, neurologic symptoms, that likely will is signs of disease progression.  He has palliative care consult next week.  He will  repeat his lab and then follow-up with me after that.  If continue to decline, then comfort and hospice measures are recommended. 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  Obtain ferritin, iron study, B12 and folate. Goals of care, counseling/discussion Discussed again today as above.  Previously refused palliative care.  Discussed palliative care consult for advance care planning. Hypotension due to hypovolemia Hold BP meds IVF today and Saturaday Proteinuria, unspecified type Holding bevacizumab   Orders Placed This Encounter  Procedures   CMP (Cancer Center only)    Standing Status:   Future    Expiration Date:   09/11/2025   Total Protein, Urine dipstick    Standing Status:   Future    Expiration Date:   09/11/2025     Garrett [name], MD  INTERVAL HISTORY: Patient returns for follow-up. Overall feeling worse.  Report of 10 pound weight loss.  Yesterday, he had fever and took Tylenol .  1 episode of headache and took Tylenol  and resolved.  Report his headache is not necessarily worse.  He is not falling.  He denies any focal weakness, visual change or imbalance.  No coughing, rigors or chills.  No abnormal urination or diarrhea or  stomach pain.  Reports some hard time breathing, and more short of breath.  He denies any pleuritic chest pain.  Report feels that he cannot get in a deep breath.  No pain around the chest area.  Reports no dysuria but only pain when the urine comes out.  Antibiotic did not help.  No increased  frequency or new symptoms.  Oncology History  Malignant neoplasm of prostate (HCC)  04/01/2018 Initial Diagnosis   Malignant neoplasm of prostate (HCC)   04/30/2018 Genetic Testing   Multi-Cancer panel (83 genes) @ [facility] - No pathogenic mutations detected Variants of Uncertain Significance in ATM, SDHA and TSC2  Genes Analyzed: 83 genes on [facility]'s Multi-Cancer panel (ALK, APC, ATM, AXIN2, BAP1, BARD1, BLM, BMPR1A, BRCA1, BRCA2, BRIP1, CASR, CDC73, CDH1, CDK4, CDKN1B, CDKN1C, CDKN2A, CEBPA, CHEK2, CTNNA1, DICER1, DIS3L2, EGFR, EPCAM, FH, FLCN, GATA2, GPC3, GREM1, HOXB13, HRAS, KIT, MAX, MEN1, MET, MITF, MLH1, MSH2, MSH3, MSH6, MUTYH, NBN, NF1, NF2, NTHL1, PALB2, PDGFRA, PHOX2B, PMS2, POLD1, POLE, POT1, PRKAR1A, PTCH1, PTEN, RAD50, RAD51C, RAD51D, RB1, RECQL4, RET, RUNX1, SDHA, SDHAF2, SDHB, SDHC, SDHD, SMAD4, SMARCA4, SMARCB1, SMARCE1, STK11, SUFU, TERC, TERT, TMEM127, TP53, TSC1, TSC2, VHL, WRN, WT1).  UPDATE: ATM [id number] VUS was amended to Likely Benign due to a re-review of the evidence in light of new variant interpretation guidelines and/or new information.   Renal cell carcinoma, right (HCC)  09/29/2022 Imaging   CT chest 1.  No evidence of metastatic disease in the thorax.  2.  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 Consider correlation with genetic testing.  3.  Reference same day abdominal MRI for details regarding the right renal mass and associated tumor thrombus in the right renal vein and IVC    09/29/2022 Imaging   MRI 1.  Right interpolar renal 3.5 cm mass concerning for primary renal malignancy. Upper pole and lower pole tumor is favored to primarily be intravenous tumor thrombus, with intravenous tumor expanding the right renal vein, and the subdiaphragmatic IVC as detailed above. Differential includes renal cell carcinoma (favored) and transitional cell carcinoma.  2.  Please see outside  08/25/2022 CT abdomen and pelvis for evaluation of the collecting system on delayed/urographic phase.  3.  No definite lymphadenopathy.  4.  Signal changes in the L5 superior endplate are favored to reflect a fracture. However, recommend continued attention on follow-up.    10/27/2022 Pathology Results   SPECIMEN    Procedure:    Radical nephrectomy     Specimen Laterality:    Right   TUMOR    Tumor Focality:    Unifocal     Tumor Size:    Greatest Dimension (Centimeters): 8.2 cm     Histologic Type:    Papillary renal cell carcinoma, type 2     Histologic Grade (WHO / ISUP):    G3 (nucleoli conspicuous and eosinophilic at 100x magnification)     Tumor Extent:    Extends into perinephric tissue (beyond renal capsule)     Tumor Extent:    Extends into renal sinus     Tumor Extent:    Extends into major vein (renal vein or its segmental branches, inferior vena cava)     Sarcomatoid Features:    Not identified     Rhabdoid Features:    Not identified     Tumor Necrosis:    Not identified     Lymphovascular Invasion:    Present   MARGINS  Margin Status:    Invasive carcinoma present at margin       Margin(s) Involved by Invasive Carcinoma:    Renal vein: present in renal vein nephrectomy margin; No tumor seen is separately submitted renal vein segment.   REGIONAL LYMPH NODES     Regional Lymph Node Status:    Not applicable (no regional lymph nodes submitted or found)   PATHOLOGIC STAGE CLASSIFICATION (pTNM, AJCC 8th Edition)     Reporting of pT, pN, and (when applicable) pM categories is based on information available to the pathologist at the time the report is issued. As per the AJCC (Chapter 1, 8th Ed.) it is the managing physician's responsibility to establish the final  pathologic stage based upon all pertinent information, including but potentially not limited to this pathology report.     Primary Tumor (pT):    pT3b     Regional Lymph Nodes (pN):    pN not assigned (no nodes  submitted or found)    03/08/2023 Imaging   CT CAP Status post radical right nephrectomy, IVC repair, and partial right adrenalectomy without evidence of recurrent or metastatic disease.  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  L5 compression deformity without progressive interval height loss  Lungs/Pleura: No large airspace consolidation or pleural effusion. Similar distribution of numerous thin-walled pulmonary cysts throughout the bilateral lungs. Bibasilar atelectasis/scarring. No new pulmonary nodules.    09/13/2023 Imaging   CT CAP 5 mm left lower lobe pulmonary nodule Solid upper lobe pulmonary nodule 3 mm 4 mm right lower lobe nodule Sclerotic focus T1 change from prior likely bone island.  No aggressive lytic lesion.  Multilevel degenerative changes of spine and bilateral shoulder Similar appearance of sclerotic superior endplate deformity at L5 unchanged dating back to September 2023.  New from April 2019. 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 No pathologically enlarged abdominal or pelvic lymph nodes.   10/08/2023 Initial Diagnosis   Renal cell carcinoma, right (HCC)   11/08/2023 - 04/03/2024 Chemotherapy   Patient is on Treatment Plan : BLADDER Pembrolizumab  (200) q21d     04/09/2024 Imaging   MRI brain: At least 30 enhancing metastases are identified within the supratentorial and infratentorial brain.   04/17/2024 Imaging   CT CAP 1. Status post right nephrectomy and adrenalectomy. 2. Unchanged appearance of soft tissue nodule in the nephrectomy bed overlying the most superior portion of the right psoas, measuring 1.7 x 0.9 cm which remains  highly suspicious for locally recurrent malignancy. 3. Multiple small bilateral pulmonary nodules, not significantly changed, measuring up to 0.6 cm, nonspecific. Continued attention on follow-up. 4. No other evidence of lymphadenopathy or metastatic disease in the chest, abdomen, or pelvis. 5. Similar intra and extrahepatic biliary ductal dilatation, common bile duct dilatation measuring up to 1.1 cm in caliber with associated periportal edema. No calculus or other obstruction visible to the ampulla. 6. Unchanged cystic lesion of the dorsal pancreatic body measuring 1.3 x 1.3 cm. No pancreatic ductal dilatation or surrounding inflammatory changes. 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 (HCC)   06/19/2024 -  Chemotherapy   Patient is on Treatment Plan : BRAIN Bevacizumab  + Everolimus  q28d        PHYSICAL EXAMINATION: ECOG PERFORMANCE STATUS: 2 - Symptomatic, <50% confined to bed  Vitals:   09/11/24 0858  BP: 95/70  Pulse: 92  Resp: 20  Temp: 98.2 F (36.8 C)  SpO2: 94%   Filed Weights   09/11/24 0858  Weight: 193 lb (87.5 kg)    GENERAL: alert, no distress and comfortable SKIN: skin color normal and no jaundice or bruising or petechiae on exposed skin EYES: normal, sclera clear OROPHARYNX: no exudate  NECK: No palpable mass LYMPH:  no palpable cervical lymphadenopathy  LUNGS: clear to auscultation and no wheeze or rales with normal breathing effort HEART: regular rate & rhythm  ABDOMEN: abdomen soft, non-tender and nondistended. Musculoskeletal: no edema NEURO: no focal motor/sensory deficits  Relevant data reviewed during this visit included labs.   New labs ordered.

## 2024-09-10 NOTE — Assessment & Plan Note (Signed)
 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

## 2024-09-10 NOTE — Assessment & Plan Note (Signed)
 Held everolimus 

## 2024-09-11 ENCOUNTER — Inpatient Hospital Stay

## 2024-09-11 ENCOUNTER — Inpatient Hospital Stay (HOSPITAL_BASED_OUTPATIENT_CLINIC_OR_DEPARTMENT_OTHER)

## 2024-09-11 VITALS — BP 122/78

## 2024-09-11 VITALS — BP 95/70 | HR 92 | Temp 98.2°F | Resp 20 | Wt 193.0 lb

## 2024-09-11 DIAGNOSIS — C649 Malignant neoplasm of unspecified kidney, except renal pelvis: Secondary | ICD-10-CM

## 2024-09-11 DIAGNOSIS — C7931 Secondary malignant neoplasm of brain: Secondary | ICD-10-CM | POA: Diagnosis not present

## 2024-09-11 DIAGNOSIS — A419 Sepsis, unspecified organism: Secondary | ICD-10-CM | POA: Diagnosis not present

## 2024-09-11 DIAGNOSIS — C641 Malignant neoplasm of right kidney, except renal pelvis: Secondary | ICD-10-CM

## 2024-09-11 DIAGNOSIS — N1832 Chronic kidney disease, stage 3b: Secondary | ICD-10-CM | POA: Diagnosis not present

## 2024-09-11 DIAGNOSIS — R630 Anorexia: Secondary | ICD-10-CM

## 2024-09-11 DIAGNOSIS — D509 Iron deficiency anemia, unspecified: Secondary | ICD-10-CM

## 2024-09-11 DIAGNOSIS — Z7189 Other specified counseling: Secondary | ICD-10-CM

## 2024-09-11 DIAGNOSIS — J189 Pneumonia, unspecified organism: Secondary | ICD-10-CM | POA: Diagnosis not present

## 2024-09-11 DIAGNOSIS — E860 Dehydration: Secondary | ICD-10-CM

## 2024-09-11 DIAGNOSIS — E861 Hypovolemia: Secondary | ICD-10-CM

## 2024-09-11 DIAGNOSIS — R809 Proteinuria, unspecified: Secondary | ICD-10-CM

## 2024-09-11 LAB — CBC WITH DIFFERENTIAL (CANCER CENTER ONLY)
Abs Immature Granulocytes: 0.37 K/uL — ABNORMAL HIGH (ref 0.00–0.07)
Basophils Absolute: 0 K/uL (ref 0.0–0.1)
Basophils Relative: 0 %
Eosinophils Absolute: 0 K/uL (ref 0.0–0.5)
Eosinophils Relative: 0 %
HCT: 35.3 % — ABNORMAL LOW (ref 39.0–52.0)
Hemoglobin: 11.8 g/dL — ABNORMAL LOW (ref 13.0–17.0)
Immature Granulocytes: 6 %
Lymphocytes Relative: 17 %
Lymphs Abs: 1 K/uL (ref 0.7–4.0)
MCH: 26.4 pg (ref 26.0–34.0)
MCHC: 33.4 g/dL (ref 30.0–36.0)
MCV: 79 fL — ABNORMAL LOW (ref 80.0–100.0)
Monocytes Absolute: 0.2 K/uL (ref 0.1–1.0)
Monocytes Relative: 3 %
Neutro Abs: 4.4 K/uL (ref 1.7–7.7)
Neutrophils Relative %: 74 %
Platelet Count: 175 K/uL (ref 150–400)
RBC: 4.47 MIL/uL (ref 4.22–5.81)
RDW: 15.3 % (ref 11.5–15.5)
Smear Review: NORMAL
WBC Count: 6 K/uL (ref 4.0–10.5)
nRBC: 0.3 % — ABNORMAL HIGH (ref 0.0–0.2)

## 2024-09-11 LAB — CMP (CANCER CENTER ONLY)
ALT: 59 U/L — ABNORMAL HIGH (ref 0–44)
AST: 69 U/L — ABNORMAL HIGH (ref 15–41)
Albumin: 3.5 g/dL (ref 3.5–5.0)
Alkaline Phosphatase: 102 U/L (ref 38–126)
Anion gap: 8 (ref 5–15)
BUN: 30 mg/dL — ABNORMAL HIGH (ref 8–23)
CO2: 30 mmol/L (ref 22–32)
Calcium: 9.6 mg/dL (ref 8.9–10.3)
Chloride: 93 mmol/L — ABNORMAL LOW (ref 98–111)
Creatinine: 1.93 mg/dL — ABNORMAL HIGH (ref 0.61–1.24)
GFR, Estimated: 35 mL/min — ABNORMAL LOW (ref >60)
Glucose, Bld: 213 mg/dL — ABNORMAL HIGH (ref 70–99)
Potassium: 3.6 mmol/L (ref 3.5–5.1)
Sodium: 131 mmol/L — ABNORMAL LOW (ref 135–145)
Total Bilirubin: 0.6 mg/dL (ref 0.0–1.2)
Total Protein: 7.1 g/dL (ref 6.5–8.1)

## 2024-09-11 LAB — T4, FREE: Free T4: 0.79 ng/dL (ref 0.61–1.12)

## 2024-09-11 LAB — VITAMIN B12: Vitamin B-12: 1837 pg/mL — ABNORMAL HIGH (ref 180–914)

## 2024-09-11 LAB — TOTAL PROTEIN, URINE DIPSTICK: Protein, ur: 300 mg/dL — AB

## 2024-09-11 LAB — FOLATE: Folate: 13 ng/mL (ref >5.9)

## 2024-09-11 LAB — FERRITIN: Ferritin: 2707 ng/mL — ABNORMAL HIGH (ref 24–336)

## 2024-09-11 MED ORDER — SODIUM CHLORIDE 0.9 % IV SOLN: Freq: Once | INTRAVENOUS | Status: AC

## 2024-09-11 NOTE — Assessment & Plan Note (Addendum)
 Borderline.  Obtain ferritin, iron study, B12 and folate.

## 2024-09-11 NOTE — Assessment & Plan Note (Addendum)
 Discussed again today as above.  Previously refused palliative care.  Discussed palliative care consult for advance care planning.

## 2024-09-11 NOTE — Patient Instructions (Signed)

## 2024-09-11 NOTE — Assessment & Plan Note (Addendum)
 Hold BP meds IVF today and Saturaday

## 2024-09-11 NOTE — Assessment & Plan Note (Signed)
 Holding bevacizumab 

## 2024-09-12 ENCOUNTER — Telehealth: Payer: Self-pay

## 2024-09-12 ENCOUNTER — Inpatient Hospital Stay (HOSPITAL_COMMUNITY)
Admission: EM | Admit: 2024-09-12 | Discharge: 2024-09-18 | DRG: 871 | Disposition: A | Discharge status: Hospice | Attending: Internal Medicine | Admitting: Internal Medicine

## 2024-09-12 ENCOUNTER — Emergency Department (HOSPITAL_COMMUNITY)

## 2024-09-12 ENCOUNTER — Other Ambulatory Visit: Payer: Self-pay

## 2024-09-12 DIAGNOSIS — L89159 Pressure ulcer of sacral region, unspecified stage: Secondary | ICD-10-CM | POA: Diagnosis present

## 2024-09-12 DIAGNOSIS — Z923 Personal history of irradiation: Secondary | ICD-10-CM

## 2024-09-12 DIAGNOSIS — Z515 Encounter for palliative care: Secondary | ICD-10-CM | POA: Diagnosis not present

## 2024-09-12 DIAGNOSIS — C641 Malignant neoplasm of right kidney, except renal pelvis: Secondary | ICD-10-CM | POA: Diagnosis present

## 2024-09-12 DIAGNOSIS — A419 Sepsis, unspecified organism: Secondary | ICD-10-CM | POA: Diagnosis present

## 2024-09-12 DIAGNOSIS — D631 Anemia in chronic kidney disease: Secondary | ICD-10-CM | POA: Diagnosis present

## 2024-09-12 DIAGNOSIS — J984 Other disorders of lung: Secondary | ICD-10-CM | POA: Diagnosis present

## 2024-09-12 DIAGNOSIS — Z1152 Encounter for screening for COVID-19: Secondary | ICD-10-CM

## 2024-09-12 DIAGNOSIS — G893 Neoplasm related pain (acute) (chronic): Secondary | ICD-10-CM

## 2024-09-12 DIAGNOSIS — C7931 Secondary malignant neoplasm of brain: Secondary | ICD-10-CM | POA: Diagnosis present

## 2024-09-12 DIAGNOSIS — N481 Balanitis: Secondary | ICD-10-CM | POA: Diagnosis present

## 2024-09-12 DIAGNOSIS — R630 Anorexia: Secondary | ICD-10-CM | POA: Diagnosis present

## 2024-09-12 DIAGNOSIS — Z66 Do not resuscitate: Secondary | ICD-10-CM | POA: Diagnosis present

## 2024-09-12 DIAGNOSIS — R509 Fever, unspecified: Secondary | ICD-10-CM | POA: Diagnosis not present

## 2024-09-12 DIAGNOSIS — N179 Acute kidney failure, unspecified: Secondary | ICD-10-CM

## 2024-09-12 DIAGNOSIS — Z880 Allergy status to penicillin: Secondary | ICD-10-CM

## 2024-09-12 DIAGNOSIS — J188 Other pneumonia, unspecified organism: Secondary | ICD-10-CM | POA: Diagnosis not present

## 2024-09-12 DIAGNOSIS — Z888 Allergy status to other drugs, medicaments and biological substances status: Secondary | ICD-10-CM

## 2024-09-12 DIAGNOSIS — R21 Rash and other nonspecific skin eruption: Secondary | ICD-10-CM | POA: Diagnosis not present

## 2024-09-12 DIAGNOSIS — E785 Hyperlipidemia, unspecified: Secondary | ICD-10-CM | POA: Diagnosis present

## 2024-09-12 DIAGNOSIS — I629 Nontraumatic intracranial hemorrhage, unspecified: Secondary | ICD-10-CM | POA: Diagnosis not present

## 2024-09-12 DIAGNOSIS — C649 Malignant neoplasm of unspecified kidney, except renal pelvis: Secondary | ICD-10-CM | POA: Diagnosis present

## 2024-09-12 DIAGNOSIS — G9389 Other specified disorders of brain: Secondary | ICD-10-CM | POA: Diagnosis not present

## 2024-09-12 DIAGNOSIS — J189 Pneumonia, unspecified organism: Secondary | ICD-10-CM | POA: Diagnosis present

## 2024-09-12 DIAGNOSIS — D849 Immunodeficiency, unspecified: Secondary | ICD-10-CM | POA: Diagnosis present

## 2024-09-12 DIAGNOSIS — N400 Enlarged prostate without lower urinary tract symptoms: Secondary | ICD-10-CM | POA: Diagnosis present

## 2024-09-12 DIAGNOSIS — Z9221 Personal history of antineoplastic chemotherapy: Secondary | ICD-10-CM

## 2024-09-12 DIAGNOSIS — Z79899 Other long term (current) drug therapy: Secondary | ICD-10-CM

## 2024-09-12 DIAGNOSIS — F419 Anxiety disorder, unspecified: Secondary | ICD-10-CM | POA: Diagnosis present

## 2024-09-12 DIAGNOSIS — Z860101 Personal history of adenomatous and serrated colon polyps: Secondary | ICD-10-CM

## 2024-09-12 DIAGNOSIS — I129 Hypertensive chronic kidney disease with stage 1 through stage 4 chronic kidney disease, or unspecified chronic kidney disease: Secondary | ICD-10-CM | POA: Diagnosis present

## 2024-09-12 DIAGNOSIS — Z905 Acquired absence of kidney: Secondary | ICD-10-CM

## 2024-09-12 DIAGNOSIS — I10 Essential (primary) hypertension: Secondary | ICD-10-CM

## 2024-09-12 DIAGNOSIS — Z7189 Other specified counseling: Secondary | ICD-10-CM | POA: Diagnosis not present

## 2024-09-12 DIAGNOSIS — Z711 Person with feared health complaint in whom no diagnosis is made: Secondary | ICD-10-CM | POA: Diagnosis not present

## 2024-09-12 DIAGNOSIS — N1832 Chronic kidney disease, stage 3b: Secondary | ICD-10-CM | POA: Diagnosis present

## 2024-09-12 DIAGNOSIS — R451 Restlessness and agitation: Secondary | ICD-10-CM

## 2024-09-12 DIAGNOSIS — Z8546 Personal history of malignant neoplasm of prostate: Secondary | ICD-10-CM

## 2024-09-12 DIAGNOSIS — R531 Weakness: Secondary | ICD-10-CM

## 2024-09-12 DIAGNOSIS — I611 Nontraumatic intracerebral hemorrhage in hemisphere, cortical: Secondary | ICD-10-CM | POA: Diagnosis not present

## 2024-09-12 DIAGNOSIS — G936 Cerebral edema: Secondary | ICD-10-CM | POA: Diagnosis not present

## 2024-09-12 DIAGNOSIS — N529 Male erectile dysfunction, unspecified: Secondary | ICD-10-CM | POA: Diagnosis present

## 2024-09-12 DIAGNOSIS — E1122 Type 2 diabetes mellitus with diabetic chronic kidney disease: Secondary | ICD-10-CM | POA: Diagnosis present

## 2024-09-12 DIAGNOSIS — R627 Adult failure to thrive: Secondary | ICD-10-CM | POA: Diagnosis present

## 2024-09-12 DIAGNOSIS — R4589 Other symptoms and signs involving emotional state: Secondary | ICD-10-CM | POA: Diagnosis not present

## 2024-09-12 DIAGNOSIS — Z720 Tobacco use: Secondary | ICD-10-CM

## 2024-09-12 DIAGNOSIS — Z8249 Family history of ischemic heart disease and other diseases of the circulatory system: Secondary | ICD-10-CM

## 2024-09-12 DIAGNOSIS — T380X5A Adverse effect of glucocorticoids and synthetic analogues, initial encounter: Secondary | ICD-10-CM | POA: Diagnosis not present

## 2024-09-12 DIAGNOSIS — Z91030 Bee allergy status: Secondary | ICD-10-CM

## 2024-09-12 DIAGNOSIS — Z8042 Family history of malignant neoplasm of prostate: Secondary | ICD-10-CM

## 2024-09-12 DIAGNOSIS — K219 Gastro-esophageal reflux disease without esophagitis: Secondary | ICD-10-CM | POA: Diagnosis present

## 2024-09-12 DIAGNOSIS — Z886 Allergy status to analgesic agent status: Secondary | ICD-10-CM

## 2024-09-12 DIAGNOSIS — G4733 Obstructive sleep apnea (adult) (pediatric): Secondary | ICD-10-CM | POA: Diagnosis present

## 2024-09-12 LAB — COMPREHENSIVE METABOLIC PANEL WITH GFR
ALT: 54 U/L — ABNORMAL HIGH (ref 0–44)
AST: 74 U/L — ABNORMAL HIGH (ref 15–41)
Albumin: 3.3 g/dL — ABNORMAL LOW (ref 3.5–5.0)
Alkaline Phosphatase: 106 U/L (ref 38–126)
Anion gap: 14 (ref 5–15)
BUN: 25 mg/dL — ABNORMAL HIGH (ref 8–23)
CO2: 22 mmol/L (ref 22–32)
Calcium: 9.2 mg/dL (ref 8.9–10.3)
Chloride: 95 mmol/L — ABNORMAL LOW (ref 98–111)
Creatinine, Ser: 1.51 mg/dL — ABNORMAL HIGH (ref 0.61–1.24)
GFR, Estimated: 48 mL/min — ABNORMAL LOW (ref >60)
Glucose, Bld: 153 mg/dL — ABNORMAL HIGH (ref 70–99)
Potassium: 3.6 mmol/L (ref 3.5–5.1)
Sodium: 132 mmol/L — ABNORMAL LOW (ref 135–145)
Total Bilirubin: 0.5 mg/dL (ref 0.0–1.2)
Total Protein: 6.7 g/dL (ref 6.5–8.1)

## 2024-09-12 LAB — CBC WITH DIFFERENTIAL/PLATELET
Abs Immature Granulocytes: 0.4 K/uL — ABNORMAL HIGH (ref 0.00–0.07)
Basophils Absolute: 0 K/uL (ref 0.0–0.1)
Basophils Relative: 1 %
Eosinophils Absolute: 0 K/uL (ref 0.0–0.5)
Eosinophils Relative: 1 %
HCT: 35.3 % — ABNORMAL LOW (ref 39.0–52.0)
Hemoglobin: 10.9 g/dL — ABNORMAL LOW (ref 13.0–17.0)
Immature Granulocytes: 8 %
Lymphocytes Relative: 17 %
Lymphs Abs: 0.9 K/uL (ref 0.7–4.0)
MCH: 25.3 pg — ABNORMAL LOW (ref 26.0–34.0)
MCHC: 30.9 g/dL (ref 30.0–36.0)
MCV: 81.9 fL (ref 80.0–100.0)
Monocytes Absolute: 0.3 K/uL (ref 0.1–1.0)
Monocytes Relative: 5 %
Neutro Abs: 3.5 K/uL (ref 1.7–7.7)
Neutrophils Relative %: 68 %
Platelets: 131 K/uL — ABNORMAL LOW (ref 150–400)
RBC: 4.31 MIL/uL (ref 4.22–5.81)
RDW: 15.1 % (ref 11.5–15.5)
Smear Review: NORMAL
WBC: 5.1 K/uL (ref 4.0–10.5)
nRBC: 0.8 % — ABNORMAL HIGH (ref 0.0–0.2)

## 2024-09-12 LAB — URINALYSIS, ROUTINE W REFLEX MICROSCOPIC
Bacteria, UA: NONE SEEN
Bilirubin Urine: NEGATIVE
Glucose, UA: NEGATIVE mg/dL
Ketones, ur: 5 mg/dL — AB
Leukocytes,Ua: NEGATIVE
Nitrite: NEGATIVE
Protein, ur: 100 mg/dL — AB
Specific Gravity, Urine: 1.017 (ref 1.005–1.030)
pH: 5 (ref 5.0–8.0)

## 2024-09-12 LAB — RESP PANEL BY RT-PCR (RSV, FLU A&B, COVID)  RVPGX2
Influenza A by PCR: NEGATIVE
Influenza B by PCR: NEGATIVE
Resp Syncytial Virus by PCR: NEGATIVE
SARS Coronavirus 2 by RT PCR: NEGATIVE

## 2024-09-12 LAB — PROTIME-INR
INR: 1 (ref 0.8–1.2)
Prothrombin Time: 13.8 s (ref 11.4–15.2)

## 2024-09-12 LAB — I-STAT CG4 LACTIC ACID, ED: Lactic Acid, Venous: 1.6 mmol/L (ref 0.5–1.9)

## 2024-09-12 LAB — LIPASE, BLOOD: Lipase: 60 U/L — ABNORMAL HIGH (ref 11–51)

## 2024-09-12 MED ORDER — IOHEXOL 350 MG/ML SOLN
100.0000 mL | Freq: Once | INTRAVENOUS | Status: AC | PRN
  Administered 2024-09-12: 100 mL via INTRAVENOUS

## 2024-09-12 MED ORDER — ACETAMINOPHEN 650 MG RE SUPP: 650.0000 mg | Freq: Four times a day (QID) | RECTAL | Status: DC | PRN

## 2024-09-12 MED ORDER — SODIUM CHLORIDE 0.9 % IV SOLN
500.0000 mg | INTRAVENOUS | Status: DC
  Administered 2024-09-12 – 2024-09-13 (×2): 500 mg via INTRAVENOUS
  Filled 2024-09-12 (×2): qty 5

## 2024-09-12 MED ORDER — SODIUM CHLORIDE 0.9 % IV SOLN
2.0000 g | INTRAVENOUS | Status: DC
  Administered 2024-09-13 – 2024-09-14 (×2): 2 g via INTRAVENOUS
  Filled 2024-09-12 (×2): qty 20

## 2024-09-12 MED ORDER — VANCOMYCIN HCL IN DEXTROSE 1-5 GM/200ML-% IV SOLN: 1000.0000 mg | Freq: Once | INTRAVENOUS | Status: DC

## 2024-09-12 MED ORDER — LACTATED RINGERS IV BOLUS
1000.0000 mL | Freq: Once | INTRAVENOUS | Status: AC
  Administered 2024-09-12: 1000 mL via INTRAVENOUS

## 2024-09-12 MED ORDER — ACETAMINOPHEN 325 MG PO TABS
650.0000 mg | ORAL_TABLET | Freq: Once | ORAL | Status: AC
  Administered 2024-09-12: 650 mg via ORAL
  Filled 2024-09-12: qty 2

## 2024-09-12 MED ORDER — METRONIDAZOLE 500 MG/100ML IV SOLN
500.0000 mg | Freq: Once | INTRAVENOUS | Status: AC
  Administered 2024-09-12: 500 mg via INTRAVENOUS
  Filled 2024-09-12: qty 100

## 2024-09-12 MED ORDER — ONDANSETRON HCL 4 MG PO TABS: 4.0000 mg | ORAL_TABLET | Freq: Four times a day (QID) | ORAL | Status: DC | PRN

## 2024-09-12 MED ORDER — ENOXAPARIN SODIUM 40 MG/0.4ML IJ SOSY
40.0000 mg | PREFILLED_SYRINGE | Freq: Every day | INTRAMUSCULAR | Status: DC
  Administered 2024-09-12: 40 mg via SUBCUTANEOUS
  Filled 2024-09-12: qty 0.4

## 2024-09-12 MED ORDER — SODIUM CHLORIDE 0.9 % IV SOLN
2.0000 g | Freq: Once | INTRAVENOUS | Status: AC
  Administered 2024-09-12: 2 g via INTRAVENOUS
  Filled 2024-09-12: qty 12.5

## 2024-09-12 MED ORDER — ONDANSETRON HCL 4 MG/2ML IJ SOLN: 4.0000 mg | Freq: Four times a day (QID) | INTRAMUSCULAR | Status: DC | PRN

## 2024-09-12 MED ORDER — LACTATED RINGERS IV SOLN: INTRAVENOUS | Status: DC

## 2024-09-12 MED ORDER — VANCOMYCIN HCL 1500 MG/300ML IV SOLN
1500.0000 mg | Freq: Once | INTRAVENOUS | Status: AC
  Administered 2024-09-12: 1500 mg via INTRAVENOUS
  Filled 2024-09-12: qty 300

## 2024-09-12 MED ORDER — BISACODYL 5 MG PO TBEC: 5.0000 mg | DELAYED_RELEASE_TABLET | Freq: Every day | ORAL | Status: DC | PRN

## 2024-09-12 MED ADMIN — Acetaminophen Tab 325 MG: 650 mg | ORAL | NDC 00904677361

## 2024-09-12 MED FILL — Acetaminophen Tab 325 MG: 650.0000 mg | ORAL | Qty: 2 | Status: AC

## 2024-09-12 NOTE — Sepsis Progress Note (Signed)
 Elink will follow per sepsis protocol.

## 2024-09-12 NOTE — H&P (Addendum)
 History and Physical    Patient: [name]:[id number] DOB: 22-May-1946 DOA: 09/12/2024 DOS: the patient was seen and examined on 09/12/2024 PCP: Seabron Lenis, MD  Patient coming from: Home  Chief Complaint:  Chief Complaint  Patient presents with   Fever   HPI: [name] is a 78 y.o. 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  He was initially diagnosed in December 2023. Most my history comes from the patient's wife.  She reports that he has been having fevers for about 3 days.  Temps at home have been in the 101 range.  He has had decreased appetite and some coughing but no rigors.  No diarrhea or vomiting.  The patient usually makes of the bed every day but in the last couple days he has had to stop and take a break which is new for him.  He has also been very weak.  Today the patient was unable to get out of bed.  That has never happened before.  He kept saying he was going to get up but did not.  She also noticed that he was a little bit confused and she could not get him to drink or eat anything.  He is usually out of bed by 9 AM.  When he would not get out a bed by noon she got scared.   His temp was 103 at that point so she called EMS.  On arrival in the emergency department the patient's temp was down to 100.3.  His O2 sats were 90% on room air and his pulse was 105 so from a vitals standpoint he was put in the sepsis pathway.  Blood pressure was fine at 139/76. He received IV vancomycin and Maxipime. Later his blood work showed a normal white blood cell count and lactic acid level.  A CT of his chest revealed bilateral patchy lower lobe opacities and the known left lower lobe nodule.  CT of the abdomen and pelvis was did not reveal any acute findings.  He does have a 2 x 0.7cm nodule in the right nephrectomy bed and Left adrenal cysts. The patient will be admitted to the hospitalist service for IV antibiotics  given his new diagnosis of pneumonia.  He was negative for flu, COVID, and RSV. The patient's last chemotherapy infusion was 3 weeks ago.  It has been put on hold because the patient was not eating and there was some concern that his multiple skin lesions may be a side effect of the chemo.  Palliative care consult had been ordered.  Review of Systems: unable to review all systems due to the inability of the patient to answer questions. Past Medical History:  Diagnosis Date   Allergy    Anemia    Arthritis    Diabetes mellitus without complication (HCC) 12/2021   ED (erectile dysfunction)    Family history of cancer    Family history of prostate cancer    Genetic testing 05/02/2018   Multi-Cancer panel (83 genes) @ [facility] - No pathogenic mutations detected   History of anemia    due to acute blood loss from lower GI bleeds 2007, 2010, 2013   History of GI diverticular bleed    tranfused 03/ 2010, 01/ 2013/  no blood needed 09/ 2014, 2015   History of lower GI bleeding 08/2006   post colonoscopy w/ polypectomy -- treatment colonoscopy w/ endo clip at polypectomy site and  2PRBCs   History of pneumothorax 07/2008   spontaneous-- tx chest tube   HLD (hyperlipidemia)    HTN (hypertension)    Hx of adenomatous colonic polyps    Hyperplasia of prostate with lower urinary tract symptoms (LUTS)    Mild obstructive sleep apnea    06-21-2018 per pt study done 8 yrs ago, told borderline osa , no cpap   Primary hypogonadism in male    Prostate cancer [facility]) urologist-  dr winter/  oncologist-  dr patrcia   first dx 04/ 2015  Stage T1c, PSA 4.94;  last prostate bx 02-25-2018  Stage T1c, Gleason 3+4, PSA 9.45, vol 120cc-- planned treatement external beam radiation   Wears glasses    Past Surgical History:  Procedure Laterality Date   COLONOSCOPY  02/08/2022   2019   COLONOSCOPY W/ POLYPECTOMY  last one 2014   GOLD SEED IMPLANT N/A 06/26/2018   Procedure: GOLD SEED IMPLANT;  Surgeon: Devere Lonni Righter, MD;  Location: [facility];  Service: Urology;  Laterality: N/A;  ONLY NEEDS 30 MIN FOR ALL PROCEDURES   PROSTATE BIOPSY  last one 02-25-2018   dr winter office   SPACE OAR INSTILLATION N/A 06/26/2018   Procedure: SPACE OAR INSTILLATION;  Surgeon: Devere Lonni Righter, MD;  Location: [facility]  Service: Urology;  Laterality: N/A;   TONSILLECTOMY  child   UMBILICAL HERNIA REPAIR  2012   Social History:  reports that he has never smoked. His smokeless tobacco use includes chew. He reports that he does not currently use alcohol. He reports that he does not use drugs.  Allergies  Allergen Reactions   Bee Venom Anaphylaxis    Foaming at mouth, unresponsive   Aspirin Nausea And Vomiting and Other (See Comments)   Tadalafil     Other Reaction(s): made patient feel bad and urinate more   Triamterene-Hctz Nausea Only   Penicillins Itching and Rash    Family History  Problem Relation Age of Onset   Heart disease Mother    Diabetes Mother    Hypertension Mother    Cancer Father 94       metastatic; unk. primary; deceased 60   Cancer Maternal Uncle        unk. primary   Cancer Paternal Aunt        unk. primary; died young   Colon cancer Paternal Uncle 47       deceased 13   Prostate cancer Cousin 65       maternal first cousin; son of uncle with metastatic cancer   Prostate cancer Cousin        paternal first cousin; son of unaffected paternal aunt   Esophageal cancer Neg Hx    Rectal cancer Neg Hx    Stomach cancer Neg Hx    Colon polyps Neg Hx     Prior to Admission medications   Medication Sig Start Date End Date Taking? Authorizing Provider  amLODipine  (NORVASC ) 5 MG tablet Take 2 tablets (10 mg total) by mouth daily. 12/14/23   Samtani, Jai-Gurmukh, MD  atorvastatin  (LIPITOR) 10 MG tablet Take 10 mg by mouth in the morning.    [provider]  dexamethasone  (DECADRON ) 1 MG tablet Take 2 tablets (2 mg total) by  mouth daily with breakfast. Start 1 mg daily after finish the 2 mg tabs as taper per other prescription. 08/14/24   Tina Pauletta BROCKS, MD  dronabinol  (MARINOL ) 5 MG capsule Take 1 capsule (5 mg total)  by mouth 2 (two) times daily before a meal. Patient not taking: Reported on 08/14/2024 04/11/24   Tina Pauletta BROCKS, MD  EPINEPHrine  0.3 mg/0.3 mL IJ SOAJ injection Inject 0.3 mg into the muscle as needed for anaphylaxis.    [provider]  everolimus  (AFINITOR ) 10 MG tablet Take 1 tablet (10 mg total) by mouth daily. 06/05/24   Tina Pauletta BROCKS, MD  finasteride  (PROSCAR ) 5 MG tablet Take 5 mg by mouth in the morning. 03/15/20   [provider]  Grape Seed Extract 100 MG CAPS Take 300 mg by mouth. 300 mg    [provider]  latanoprost  (XALATAN ) 0.005 % ophthalmic solution 1 drop at bedtime. 01/23/22   [provider]  lisinopril -hydrochlorothiazide  (ZESTORETIC ) 20-25 MG tablet 1 tablet Orally Once a day for 90 days    [provider]  Multiple Vitamins-Minerals (MULTIVITAMIN WITH MINERALS) tablet Take 1 tablet by mouth in the morning.    [provider]  nystatin cream (MYCOSTATIN) Apply to affected area 2 times daily 09/01/24   Curatolo, Adam, DO  omeprazole  (PRILOSEC) 20 MG capsule TAKE 1 CAPSULE(20 MG) BY MOUTH DAILY 02/28/24   Tina Pauletta BROCKS, MD  OVER THE COUNTER MEDICATION Take 1 tablet by mouth in the morning. Lion's mane supplement    [provider]  sildenafil (VIAGRA) 100 MG tablet Take 100 mg by mouth as needed. 02/15/24   [provider]  tamsulosin  (FLOMAX ) 0.4 MG CAPS capsule Take 0.4 mg by mouth daily after breakfast.     [provider]  triamcinolone  ointment (KENALOG ) 0.5 % Apply 1 Application topically 2 (two) times daily. 07/17/24   Tina Pauletta BROCKS, MD    Physical Exam: Vitals:   09/12/24 1415 09/12/24 1530 09/12/24 1659 09/12/24 1923  BP: 139/76  (!) 94/56 126/73  Pulse: (!) 105 (!) 105 96 86  Resp: (!) 22 18  (!) 24 (!) 21  Temp: (!) 102.3 F (39.1 C) 100.3 F (37.9 C) 99.9 F (37.7 C) 100.1 F (37.8 C)  TempSrc:  Oral Oral Oral  SpO2: 98% 90% 91% 100%   Physical Exam:  General: No acute distress, well developed, well nourished HEENT: Normocephalic, atraumatic, PERRL Cardiovascular: Normal rate and rhythm. Distal pulses intact. Pulmonary: Normal pulmonary effort, diminshed breath sounds Gastrointestinal: Distended abdomen, non-tender, hypoactive bowel sounds Musculoskeletal:Normal ROM, no lower ext edema Skin: Skin is warm and very dry, flaky. Abrasion on penis Neuro: No focal deficits noted.  Generalized weakness. Alert to name and place, not date PSYCH: Attentive and cooperative  Data Reviewed:  Results for orders placed or performed during the hospital encounter of 09/12/24 (from the past 24 hours)  Resp panel by RT-PCR (RSV, Flu A&B, Covid) Anterior Nasal Swab     Status: None   Collection Time: 09/12/24  1:55 PM   Specimen: Anterior Nasal Swab  Result Value Ref Range   SARS Coronavirus 2 by RT PCR NEGATIVE NEGATIVE   Influenza A by PCR NEGATIVE NEGATIVE   Influenza B by PCR NEGATIVE NEGATIVE   Resp Syncytial Virus by PCR NEGATIVE NEGATIVE  Blood Culture (routine x 2)     Status: None (Preliminary result)   Collection Time: 09/12/24  1:55 PM   Specimen: BLOOD LEFT FOREARM  Result Value Ref Range   Specimen Description BLOOD LEFT FOREARM    Special Requests      BOTTLES DRAWN AEROBIC AND ANAEROBIC Blood Culture adequate volume Performed at [facility], [address], [place], [place] [address]  Culture PENDING    Report Status PENDING   I-Stat Lactic Acid, ED     Status: None   Collection Time: 09/12/24  2:07 PM  Result Value Ref Range   Lactic Acid, Venous 1.6 0.5 - 1.9 mmol/L  Comprehensive metabolic panel     Status: Abnormal   Collection Time: 09/12/24  3:02 PM  Result Value Ref Range   Sodium 132 (L) 135 - 145 mmol/L   Potassium 3.6 3.5 - 5.1 mmol/L    Chloride 95 (L) 98 - 111 mmol/L   CO2 22 22 - 32 mmol/L   Glucose, Bld 153 (H) 70 - 99 mg/dL   BUN 25 (H) 8 - 23 mg/dL   Creatinine, Ser 8.48 (H) 0.61 - 1.24 mg/dL   Calcium  9.2 8.9 - 10.3 mg/dL   Total Protein 6.7 6.5 - 8.1 g/dL   Albumin 3.3 (L) 3.5 - 5.0 g/dL   AST 74 (H) 15 - 41 U/L   ALT 54 (H) 0 - 44 U/L   Alkaline Phosphatase 106 38 - 126 U/L   Total Bilirubin 0.5 0.0 - 1.2 mg/dL   GFR, Estimated 48 (L) >60 mL/min   Anion gap 14 5 - 15  Lipase, blood     Status: Abnormal   Collection Time: 09/12/24  3:02 PM  Result Value Ref Range   Lipase 60 (H) 11 - 51 U/L  Protime-INR     Status: None   Collection Time: 09/12/24  3:02 PM  Result Value Ref Range   Prothrombin Time 13.8 11.4 - 15.2 seconds   INR 1.0 0.8 - 1.2  CBC with Differential     Status: Abnormal   Collection Time: 09/12/24  3:58 PM  Result Value Ref Range   WBC 5.1 4.0 - 10.5 K/uL   RBC 4.31 4.22 - 5.81 MIL/uL   Hemoglobin 10.9 (L) 13.0 - 17.0 g/dL   HCT 64.6 (L) 60.9 - 47.9 %   MCV 81.9 80.0 - 100.0 fL   MCH 25.3 (L) 26.0 - 34.0 pg   MCHC 30.9 30.0 - 36.0 g/dL   RDW 84.8 88.4 - 84.4 %   Platelets 131 (L) 150 - 400 K/uL   nRBC 0.8 (H) 0.0 - 0.2 %   Neutrophils Relative % 68 %   Neutro Abs 3.5 1.7 - 7.7 K/uL   Lymphocytes Relative 17 %   Lymphs Abs 0.9 0.7 - 4.0 K/uL   Monocytes Relative 5 %   Monocytes Absolute 0.3 0.1 - 1.0 K/uL   Eosinophils Relative 1 %   Eosinophils Absolute 0.0 0.0 - 0.5 K/uL   Basophils Relative 1 %   Basophils Absolute 0.0 0.0 - 0.1 K/uL   WBC Morphology MORPHOLOGY UNREMARKABLE    RBC Morphology MORPHOLOGY UNREMARKABLE    Smear Review Normal platelet morphology    Immature Granulocytes 8 %   Abs Immature Granulocytes 0.40 (H) 0.00 - 0.07 K/uL  Urinalysis, Routine w reflex microscopic -Urine, Clean Catch     Status: Abnormal   Collection Time: 09/12/24  4:05 PM  Result Value Ref Range   Color, Urine YELLOW YELLOW   APPearance CLEAR CLEAR   Specific Gravity, Urine 1.017  1.005 - 1.030   pH 5.0 5.0 - 8.0   Glucose, UA NEGATIVE NEGATIVE mg/dL   Hgb urine dipstick MODERATE (A) NEGATIVE   Bilirubin Urine NEGATIVE NEGATIVE   Ketones, ur 5 (A) NEGATIVE mg/dL   Protein, ur 899 (A) NEGATIVE mg/dL   Nitrite NEGATIVE NEGATIVE   Leukocytes,Ua  NEGATIVE NEGATIVE   RBC / HPF 0-5 0 - 5 RBC/hpf   WBC, UA 0-5 0 - 5 WBC/hpf   Bacteria, UA NONE SEEN NONE SEEN   Squamous Epithelial / HPF 0-5 0 - 5 /HPF   Mucus PRESENT      Assessment and Plan: Pneumonia in immunocompromised patient with cancer metastatic to lung and brain - IV Rocephin and Zithromax per pneumonia order set recommendations - Oxygen as needed  2. RCC with brain and lung mets - Palliative care has been consulted by the emergency department because of the patient's wife's request.  The patient's wife does not seem to be aware of the patient's prognosis. - His oncologist is Dr Tina  3. Left sacral wound present on arrival, staging not yet determined - pressure relief  4. Htn - May hold meds as bp was low yesterday.   Advance Care Planning:   Code Status: Full Code the patient names his wife as a surrogate decision maker and wants to be full code.  Consults: None  Family Communication: The patient's wife is at bedside  Severity of Illness: The appropriate patient status for this patient is INPATIENT. Inpatient status is judged to be reasonable and necessary in order to provide the required intensity of service to ensure the patient's safety. The patient's presenting symptoms, physical exam findings, and initial radiographic and laboratory data in the context of their chronic comorbidities is felt to place them at high risk for further clinical deterioration. Furthermore, it is not anticipated that the patient will be medically stable for discharge from the hospital within 2 midnights of admission.   * I certify that at the point of admission it is my clinical judgment that the patient will require  inpatient hospital care spanning beyond 2 midnights from the point of admission due to high intensity of service, high risk for further deterioration and high frequency of surveillance required.*  Author: ARTHEA CHILD, MD 09/12/2024 8:04 PM  For on call review [email or web address].

## 2024-09-12 NOTE — ED Provider Notes (Signed)
 [place] EMERGENCY DEPARTMENT AT [facility] Provider Note   CSN: [id number] Arrival date & time: 09/12/24  1320     Patient presents with: Fever   [name] is a 78 y.o. male.   EMS states called out for confusion.  He had a fever 103 with EMS.  He has been having fever on and off for a while now once again at the last several days.  He is on some immunologic/chemotherapies for renal cell carcinoma with metastasis to the brain.  Does not sound like his had any seizures.  Denies any headache or neck pain.  He denies any nausea vomit diarrhea.  He has not been eating or drinking much.  Palliative care supposed be coming to the house/do an evaluation.  His immunologic meds/chemotherapy meds have been on hold.  He got IV fluids yesterday at oncology office to have blood work done.  Got worse today with being less responsive not eating not drinking.  The history is provided by the patient and the EMS personnel.       Prior to Admission medications   Medication Sig Start Date End Date Taking? Authorizing Provider  amLODipine  (NORVASC ) 5 MG tablet Take 2 tablets (10 mg total) by mouth daily. 12/14/23   Samtani, Jai-Gurmukh, MD  atorvastatin  (LIPITOR) 10 MG tablet Take 10 mg by mouth in the morning.    [provider]  dexamethasone  (DECADRON ) 1 MG tablet Take 2 tablets (2 mg total) by mouth daily with breakfast. Start 1 mg daily after finish the 2 mg tabs as taper per other prescription. 08/14/24   Tina Pauletta BROCKS, MD  dronabinol  (MARINOL ) 5 MG capsule Take 1 capsule (5 mg total) by mouth 2 (two) times daily before a meal. Patient not taking: Reported on 08/14/2024 04/11/24   Tina Pauletta BROCKS, MD  EPINEPHrine  0.3 mg/0.3 mL IJ SOAJ injection Inject 0.3 mg into the muscle as needed for anaphylaxis.    [provider]  everolimus  (AFINITOR ) 10 MG tablet Take 1 tablet (10 mg total) by mouth daily. 06/05/24   Tina Pauletta BROCKS, MD  finasteride  (PROSCAR ) 5 MG tablet  Take 5 mg by mouth in the morning. 03/15/20   [provider]  Grape Seed Extract 100 MG CAPS Take 300 mg by mouth. 300 mg    [provider]  latanoprost  (XALATAN ) 0.005 % ophthalmic solution 1 drop at bedtime. 01/23/22   [provider]  lisinopril -hydrochlorothiazide  (ZESTORETIC ) 20-25 MG tablet 1 tablet Orally Once a day for 90 days    [provider]  Multiple Vitamins-Minerals (MULTIVITAMIN WITH MINERALS) tablet Take 1 tablet by mouth in the morning.    [provider]  nystatin cream (MYCOSTATIN) Apply to affected area 2 times daily 09/01/24   Jaclyne Haverstick, DO  omeprazole  (PRILOSEC) 20 MG capsule TAKE 1 CAPSULE(20 MG) BY MOUTH DAILY 02/28/24   Tina Pauletta BROCKS, MD  OVER THE COUNTER MEDICATION Take 1 tablet by mouth in the morning. Lion's mane supplement    [provider]  sildenafil (VIAGRA) 100 MG tablet Take 100 mg by mouth as needed. 02/15/24   [provider]  tamsulosin  (FLOMAX ) 0.4 MG CAPS capsule Take 0.4 mg by mouth daily after breakfast.     [provider]  triamcinolone  ointment (KENALOG ) 0.5 % Apply 1 Application topically 2 (two) times daily. 07/17/24   Tina Pauletta BROCKS, MD    Allergies: Bee venom, Aspirin, Tadalafil, Triamterene-hctz, and Penicillins    Review of Systems  Updated Vital Signs BP 139/76   Pulse (!) 105   Temp 100.3 F (37.9 C) (Oral)   Resp 18   SpO2 90%   Physical Exam Vitals and nursing note reviewed.  Constitutional:      General: He is not in acute distress.    Appearance: He is well-developed.  HENT:     Head: Normocephalic and atraumatic.     Mouth/Throat:     Mouth: Mucous membranes are moist.  Eyes:     Extraocular Movements: Extraocular movements intact.     Conjunctiva/sclera: Conjunctivae normal.     Pupils: Pupils are equal, round, and reactive to light.  Cardiovascular:     Rate and Rhythm: Normal rate and regular rhythm.     Pulses: Normal pulses.     Heart  sounds: Normal heart sounds. No murmur heard. Pulmonary:     Effort: Pulmonary effort is normal. No respiratory distress.     Breath sounds: Normal breath sounds.  Abdominal:     General: Abdomen is flat.     Palpations: Abdomen is soft.     Tenderness: There is no abdominal tenderness.  Musculoskeletal:        General: No swelling.     Cervical back: Normal range of motion and neck supple.  Skin:    General: Skin is warm and dry.     Capillary Refill: Capillary refill takes less than 2 seconds.  Neurological:     General: No focal deficit present.     Mental Status: He is alert.     Comments: Moves all extremities, normal speech normal strength follows commands, his name where he is  Psychiatric:        Mood and Affect: Mood normal.     (all labs ordered are listed, but only abnormal results are displayed) Labs Reviewed  COMPREHENSIVE METABOLIC PANEL WITH GFR - Abnormal; Notable for the following components:      Result Value   Sodium 132 (*)    Chloride 95 (*)    Glucose, Bld 153 (*)    BUN 25 (*)    Creatinine, Ser 1.51 (*)    Albumin 3.3 (*)    AST 74 (*)    ALT 54 (*)    GFR, Estimated 48 (*)    All other components within normal limits  LIPASE, BLOOD - Abnormal; Notable for the following components:   Lipase 60 (*)    All other components within normal limits  RESP PANEL BY RT-PCR (RSV, FLU A&B, COVID)  RVPGX2  CULTURE, BLOOD (ROUTINE X 2)  CULTURE, BLOOD (ROUTINE X 2)  URINE CULTURE  PROTIME-INR  CBC WITH DIFFERENTIAL/PLATELET  URINALYSIS, ROUTINE W REFLEX MICROSCOPIC  I-STAT CG4 LACTIC ACID, ED  I-STAT CG4 LACTIC ACID, ED    EKG: None  Radiology: DG Chest Portable 1 View Result Date: 09/12/2024 CLINICAL DATA:  fever EXAM: PORTABLE CHEST - 1 VIEW COMPARISON:  09/01/2024 FINDINGS: Mild elevation of the right hemidiaphragm due to low lung volumes. No focal airspace consolidation, pleural effusion, or pneumothorax. The cardiac silhouette is at the upper  limits of normal, likely accentuated by AP technique and low lung volumes. Sternotomy wires. Tortuous aorta with aortic atherosclerosis. No acute fracture or destructive lesions. Multilevel thoracic osteophytosis. IMPRESSION: Low lung volumes.  Otherwise, no acute cardiopulmonary abnormality. Electronically Signed   By: Rogelia Myers M.D.   On: 09/12/2024 15:04     .Critical Care  Performed by: Ruthe Cornet, DO Authorized by: Ruthe Cornet, DO  Critical care provider statement:    Critical care time (minutes):  35   Critical care was necessary to treat or prevent imminent or life-threatening deterioration of the following conditions:  Sepsis   Critical care was time spent personally by me on the following activities:  Blood draw for specimens, development of treatment plan with patient or surrogate, discussions with primary provider, evaluation of patient's response to treatment, examination of patient, obtaining history from patient or surrogate, ordering and performing treatments and interventions, ordering and review of laboratory studies, ordering and review of radiographic studies, pulse oximetry, re-evaluation of patient's condition and review of old charts   Care discussed with: admitting provider      Medications Ordered in the ED  lactated ringers  infusion (has no administration in time range)  vancomycin (VANCOREADY) IVPB 1500 mg/300 mL (1,500 mg Intravenous New Bag/Given 09/12/24 1444)  lactated ringers  infusion (has no administration in time range)  ceFEPIme (MAXIPIME) 2 g in sodium chloride  0.9 % 100 mL IVPB (0 g Intravenous Stopped 09/12/24 1447)  lactated ringers  bolus 1,000 mL (0 mLs Intravenous Stopped 09/12/24 1531)  acetaminophen  (TYLENOL ) tablet 650 mg (650 mg Oral Given 09/12/24 1415)                                    Medical Decision Making Amount and/or Complexity of Data Reviewed Labs: ordered. Radiology: ordered.  Risk OTC drugs. Prescription drug  management.   Bertran E Krasner is here with mental status fever failure to thrive.  Overall patient is awake and alert follows commands.  Per EMS he had a fever of 103.  Saw oncology team yesterday got IV fluids.  But has not been eating or drinking pain confused.  No falls.  He has been having some intermittent fever in the last several weeks to months.  He is on some immunosuppressants for renal cell carcinoma with brain metastasis.  Supposed to be getting evaluation by palliative care.  He lives at home with his wife.  He denies any cough or pain with urination but ultimately will evaluate for viral processes, pneumonia, UTI.  Not having any headache or neck pain.  Is not having any abdominal pain.  Will start broad-spectrum IV antibiotics IV fluids check basic labs anticipate admission.  Less far creatinine 1.51 her GFR is greater than 40.  Will get CT scan of the chest to further evaluate for pneumonia PE as well as a CT scan abdomen pelvis to further look for source for infection.  Patient still pending urinalysis.  Lactic acid is normal.  Chest x-ray no evidence of pneumonia or pneumothorax.  I reviewed interpreted labs and imaging thus far.  Patient handed off the patient pending CT scan of chest abdomen pelvis.  Viral panel is unremarkable.  Please see oncoming provider's note for further results evaluation and disposition of the patient.  Anticipate admission for further sepsis workup.  This chart was dictated using voice recognition software.  Despite best efforts to proofread,  errors can occur which can change the documentation meaning.      Final diagnoses:  Fever, unspecified fever cause  Sepsis, due to unspecified organism, unspecified whether acute organ dysfunction present [facility])    ED Discharge Orders     None          Ruthe Cornet, DO 09/12/24 1601

## 2024-09-12 NOTE — ED Notes (Signed)
 Pt has sacral wound on left side, MD notified

## 2024-09-12 NOTE — Telephone Encounter (Signed)
 Wife states husband is incoherent at this time he is in bed and will not get out. Patient is refusing food or fluids. I sent an in box to Palliative care to call the wife but they are not in the office today.After speaking to the wife it was suggested that she take him to the ED for evaluation. I will inform Dr Tina.Arland Legions BSN RN

## 2024-09-12 NOTE — Telephone Encounter (Signed)
 Wife called states patient will not get out of bed today. He is talking but just refuse to get up, no amount of encouragement is helping.  Was given his medication but unsure if he took it. Requesting to speak with Palliative care now. Will forward to palliative care.Arland Legions BSN RN

## 2024-09-12 NOTE — ED Provider Notes (Signed)
 Patient is a 78 year old male who presents with fever.  Care was taken over from Dr. Ruthe.  He was awaiting CT scans.  His CTA of his chest does not show any evidence of PE but there is bilateral patchy infiltrates.  Given his fever, this would be concerning for pneumonia.  He has been given antibiotics already.  No areas of infection was noted in his abdomen.  His urine does not appear to be infected.  Will plan admission.  Discussed with Dr. Arthea Lenor Hollering, MD 09/12/24 [phone]

## 2024-09-12 NOTE — ED Triage Notes (Signed)
 BIB eMS from home for fevers at home for two days. Seen at [facility] yesterday and sent home and to take Tylenol . Temps at home 103F. Brain and lung cancer/on chemo/ with diminished lung sounds. O2 92-93%.

## 2024-09-13 ENCOUNTER — Encounter (HOSPITAL_COMMUNITY): Payer: Self-pay | Admitting: Internal Medicine

## 2024-09-13 ENCOUNTER — Other Ambulatory Visit: Payer: Self-pay

## 2024-09-13 ENCOUNTER — Inpatient Hospital Stay

## 2024-09-13 DIAGNOSIS — R509 Fever, unspecified: Secondary | ICD-10-CM | POA: Diagnosis not present

## 2024-09-13 DIAGNOSIS — Z7189 Other specified counseling: Secondary | ICD-10-CM

## 2024-09-13 DIAGNOSIS — A419 Sepsis, unspecified organism: Secondary | ICD-10-CM | POA: Diagnosis not present

## 2024-09-13 DIAGNOSIS — Z515 Encounter for palliative care: Secondary | ICD-10-CM | POA: Diagnosis not present

## 2024-09-13 DIAGNOSIS — R531 Weakness: Secondary | ICD-10-CM

## 2024-09-13 DIAGNOSIS — J189 Pneumonia, unspecified organism: Secondary | ICD-10-CM | POA: Diagnosis not present

## 2024-09-13 LAB — BASIC METABOLIC PANEL WITH GFR
Anion gap: 12 (ref 5–15)
BUN: 22 mg/dL (ref 8–23)
CO2: 24 mmol/L (ref 22–32)
Calcium: 9.1 mg/dL (ref 8.9–10.3)
Chloride: 96 mmol/L — ABNORMAL LOW (ref 98–111)
Creatinine, Ser: 1.48 mg/dL — ABNORMAL HIGH (ref 0.61–1.24)
GFR, Estimated: 49 mL/min — ABNORMAL LOW (ref >60)
Glucose, Bld: 135 mg/dL — ABNORMAL HIGH (ref 70–99)
Potassium: 3.7 mmol/L (ref 3.5–5.1)
Sodium: 133 mmol/L — ABNORMAL LOW (ref 135–145)

## 2024-09-13 LAB — RESPIRATORY PANEL BY PCR

## 2024-09-13 LAB — URINE CULTURE: Culture: NO GROWTH

## 2024-09-13 LAB — HIV ANTIBODY (ROUTINE TESTING W REFLEX): HIV Screen 4th Generation wRfx: NONREACTIVE

## 2024-09-13 LAB — CBC
HCT: 36 % — ABNORMAL LOW (ref 39.0–52.0)
Hemoglobin: 11 g/dL — ABNORMAL LOW (ref 13.0–17.0)
MCH: 25.4 pg — ABNORMAL LOW (ref 26.0–34.0)
MCHC: 30.6 g/dL (ref 30.0–36.0)
MCV: 83.1 fL (ref 80.0–100.0)
Platelets: 176 K/uL (ref 150–400)
RBC: 4.33 MIL/uL (ref 4.22–5.81)
RDW: 15.4 % (ref 11.5–15.5)
WBC: 5.7 K/uL (ref 4.0–10.5)
nRBC: 0.5 % — ABNORMAL HIGH (ref 0.0–0.2)

## 2024-09-13 LAB — MAGNESIUM: Magnesium: 2 mg/dL (ref 1.7–2.4)

## 2024-09-13 LAB — LACTIC ACID, PLASMA: Lactic Acid, Venous: 1.5 mmol/L (ref 0.5–1.9)

## 2024-09-13 MED ORDER — LATANOPROST 0.005 % OP SOLN
1.0000 [drp] | Freq: Every day | OPHTHALMIC | Status: DC
  Administered 2024-09-13 – 2024-09-18 (×4): 1 [drp] via OPHTHALMIC
  Filled 2024-09-13: qty 2.5

## 2024-09-13 MED ORDER — ACETAMINOPHEN 10 MG/ML IV SOLN
1000.0000 mg | Freq: Once | INTRAVENOUS | Status: DC
  Filled 2024-09-13: qty 100

## 2024-09-13 MED ORDER — ACETAMINOPHEN 500 MG PO TABS
1000.0000 mg | ORAL_TABLET | Freq: Four times a day (QID) | ORAL | Status: DC
  Administered 2024-09-13: 1000 mg via ORAL
  Filled 2024-09-13 (×2): qty 2

## 2024-09-13 MED ORDER — TAMSULOSIN HCL 0.4 MG PO CAPS
0.4000 mg | ORAL_CAPSULE | Freq: Every day | ORAL | Status: DC
  Administered 2024-09-13 – 2024-09-16 (×4): 0.4 mg via ORAL
  Filled 2024-09-13 (×5): qty 1

## 2024-09-13 MED ORDER — LACTATED RINGERS IV SOLN: INTRAVENOUS | Status: DC

## 2024-09-13 MED ORDER — LACTATED RINGERS IV BOLUS
500.0000 mL | Freq: Once | INTRAVENOUS | Status: AC
  Administered 2024-09-13: 500 mL via INTRAVENOUS

## 2024-09-13 MED ORDER — FINASTERIDE 5 MG PO TABS
5.0000 mg | ORAL_TABLET | Freq: Every morning | ORAL | Status: DC
  Administered 2024-09-13 – 2024-09-17 (×4): 5 mg via ORAL
  Filled 2024-09-13 (×5): qty 1

## 2024-09-13 MED ORDER — ACETAMINOPHEN 10 MG/ML IV SOLN
1000.0000 mg | Freq: Once | INTRAVENOUS | Status: AC
  Administered 2024-09-13: 1000 mg via INTRAVENOUS
  Filled 2024-09-13: qty 100

## 2024-09-13 MED ORDER — NYSTATIN 100000 UNIT/GM EX CREA
TOPICAL_CREAM | Freq: Two times a day (BID) | CUTANEOUS | Status: DC
Start: 2024-09-13 — End: 2024-09-19
  Administered 2024-09-16: 1 via TOPICAL
  Filled 2024-09-13: qty 30

## 2024-09-13 MED ORDER — PANTOPRAZOLE SODIUM 40 MG PO TBEC
40.0000 mg | DELAYED_RELEASE_TABLET | Freq: Every day | ORAL | Status: DC
  Administered 2024-09-13 – 2024-09-16 (×4): 40 mg via ORAL
  Filled 2024-09-13 (×4): qty 1

## 2024-09-13 MED ORDER — HEPARIN SODIUM (PORCINE) 5000 UNIT/ML IJ SOLN
5000.0000 [IU] | Freq: Three times a day (TID) | INTRAMUSCULAR | Status: DC
  Administered 2024-09-13 – 2024-09-17 (×12): 5000 [IU] via SUBCUTANEOUS
  Filled 2024-09-13 (×12): qty 1

## 2024-09-13 MED ORDER — ACETAMINOPHEN 325 MG PO TABS
650.0000 mg | ORAL_TABLET | Freq: Four times a day (QID) | ORAL | Status: DC
  Administered 2024-09-13 – 2024-09-16 (×12): 650 mg via ORAL
  Filled 2024-09-13 (×14): qty 2

## 2024-09-13 MED ADMIN — Acetaminophen Tab 325 MG: 650 mg | ORAL | NDC 00904677361

## 2024-09-13 NOTE — Consult Note (Signed)
 Consultation Note Date: 09/13/2024   Patient Name: [name]  DOB: 03/08/1946  MRN: [id number]  Age / Sex: 78 y.o., male  PCP: Seabron Lenis, MD Referring Physician: Tobie Yetta HERO, MD  Reason for Consultation: Establishing goals of care  HPI/Patient Profile: 78 y.o. 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 His wife reports 3 days of fever (up to 1051F), poor appetite, weakness, and new confusion. He was unable to get out of bed on 09-12-24, prompting EMS activation.   On arrival to the ED, he was febrile (100.51F), tachycardic (HR 105), and hypoxic (O? sat 90% on room air). He was started on IV vancomycin and Maxipime under the sepsis pathway.   CT chest showed bilateral patchy lower-lobe opacities consistent with pneumonia, along with a known left lower-lobe nodule. CT abdomen/pelvis showed no acute findings but noted a small right nephrectomy bed nodule and left adrenal cysts. Labs revealed normal WBC and lactate. COVID, influenza, and RSV were negative. He was admitted for IV antibiotics.   Last chemotherapy was three weeks ago but currently on hold due to poor intake and concern for chemo-related skin lesions.    HCPOA   Wife.   Palliative medicine is specialized medical care for people living with serious illness. It focuses on providing relief from the symptoms and stress of a serious illness. The goal is to improve quality of life for both the patient and the family.  Goals of care: Broad aims of medical therapy in relation to the patient's values and preferences. Our aim is to provide medical care aimed at enabling patients to achieve the goals that matter most to them, given the circumstances of their particular medical situation and their constraints.     SUMMARY OF  RECOMMENDATIONS   Met with patient and patient's wife at bedside. Patient remains resting comfortably at present, while he stirs and clears throat from time to time, did not actually awaken or participate in discussion.   Wife is tearful and acknowledges the difficulty of this journey. She shared that the patient told her 2-3 days ago that he felt he was dying. She desires dignity and comfort for him and has prior experience with hospice care when a close family member died in a local residential hospice facility.  Discussion held regarding goals of care, including full code versus DNR/DNI status and the philosophy of hospice care. Explained differences between full interventions and comfort-focused care. The patient code status to be revised to DNR DNI with limited interventions and not comfort care as of now. Additionally, have also discussed with her about how hospice can help in this situation.    Plan is to allow time and space for reflection and to continue current hospitalization.  Continue antibiotics and supportive care per primary team.  Provide emotional support and anticipatory guidance to patient and wife.  Revisit goals of care - specifically addition of hospice services upon discharge  based on how the  clinical course evolves.  Palliative care team to follow for ongoing support.  Code Status/Advance Care Planning: DNR   Symptom Management:     Palliative Prophylaxis:  Delirium Protocol  Additional Recommendations (Limitations, Scope, Preferences): Avoid Hospitalization  Psycho-social/Spiritual:  Desire for further Chaplaincy support:yes Additional Recommendations: Education on Hospice  Prognosis:  < 3 months  Discharge Planning: hospice in some capacity -likely home with hospice is being discussed, wife to reflect on this, continue current care over the weekend, now DNR DNI limited interventions.       Primary Diagnoses: Present on Admission:  Pneumonia   I  have reviewed the medical record, interviewed the patient and family, and examined the patient. The following aspects are pertinent.  Past Medical History:  Diagnosis Date   Allergy    Anemia    Arthritis    Diabetes mellitus without complication (HCC) 12/2021   ED (erectile dysfunction)    Family history of cancer    Family history of prostate cancer    Genetic testing 05/02/2018   Multi-Cancer panel (83 genes) @ [facility] - No pathogenic mutations detected   History of anemia    due to acute blood loss from lower GI bleeds 2007, 2010, 2013   History of GI diverticular bleed    tranfused 03/ 2010, 01/ 2013/  no blood needed 09/ 2014, 2015   History of lower GI bleeding 08/2006   post colonoscopy w/ polypectomy -- treatment colonoscopy w/ endo clip at polypectomy site and 2PRBCs   History of pneumothorax 07/2008   spontaneous-- tx chest tube   HLD (hyperlipidemia)    HTN (hypertension)    Hx of adenomatous colonic polyps    Hyperplasia of prostate with lower urinary tract symptoms (LUTS)    Mild obstructive sleep apnea    06-21-2018 per pt study done 8 yrs ago, told borderline osa , no cpap   Primary hypogonadism in male    Prostate cancer [facility]) urologist-  dr winter/  oncologist-  dr patrcia   first dx 04/ 2015  Stage T1c, PSA 4.94;  last prostate bx 02-25-2018  Stage T1c, Gleason 3+4, PSA 9.45, vol 120cc-- planned treatement external beam radiation   Wears glasses    Social History   Socioeconomic History   Marital status: Married    Spouse name: Not on file   Number of children: 2   Years of education: Not on file   Highest education level: Not on file  Occupational History   Occupation: HIGHWAY INSPECTOR    Employer: [employer]  Tobacco Use   Smoking status: Never   Smokeless tobacco: Current    Types: Chew   Tobacco comments:    06-21-2018 chew tobacco since age 68 (since 96s) 27oz/week  (60 yrs)  Vaping Use   Vaping status: Never Used   Substance and Sexual Activity   Alcohol use: Not Currently    Comment: weekend--- case of beer   Drug use: No   Sexual activity: Not on file  Other Topics Concern   Not on file  Social History Narrative   The patient is married and lives in [place].   Social Drivers of Corporate investment banker Strain: Not on file  Food Insecurity: No Food Insecurity (09/12/2024)   Hunger Vital Sign    Worried About Running Out of Food in the Last Year: Never true    Ran Out of Food in the Last Year: Never true  Transportation Needs: No  Transportation Needs (09/12/2024)   PRAPARE - Administrator, Civil Service (Medical): No    Lack of Transportation (Non-Medical): No  Physical Activity: Not on file  Stress: Not on file  Social Connections: Socially Integrated (09/12/2024)   Social Connection and Isolation Panel    Frequency of Communication with Friends and Family: More than three times a week    Frequency of Social Gatherings with Friends and Family: More than three times a week    Attends Religious Services: More than 4 times per year    Active Member of [employer] or Organizations: No    Attends Engineer, structural: More than 4 times per year    Marital Status: Married   Family History  Problem Relation Age of Onset   Heart disease Mother    Diabetes Mother    Hypertension Mother    Cancer Father 70       metastatic; unk. primary; deceased 65   Cancer Maternal Uncle        unk. primary   Cancer Paternal Aunt        unk. primary; died young   Colon cancer Paternal Uncle 8       deceased 72   Prostate cancer Cousin 93       maternal first cousin; son of uncle with metastatic cancer   Prostate cancer Cousin        paternal first cousin; son of unaffected paternal aunt   Esophageal cancer Neg Hx    Rectal cancer Neg Hx    Stomach cancer Neg Hx    Colon polyps Neg Hx    Scheduled Meds:  finasteride   5 mg Oral q AM   heparin  injection (subcutaneous)  5,000  Units Subcutaneous Q8H   latanoprost   1 drop Both Eyes QHS   pantoprazole   40 mg Oral Daily   tamsulosin   0.4 mg Oral QPC breakfast   Continuous Infusions:  acetaminophen      azithromycin Stopped (09/13/24 0827)   cefTRIAXone (ROCEPHIN)  IV 2 g (09/13/24 0227)   lactated ringers      PRN Meds:.acetaminophen  **OR** acetaminophen , bisacodyl , ondansetron  **OR** ondansetron  (ZOFRAN ) IV Medications Prior to Admission:  Prior to Admission medications   Medication Sig Start Date End Date Taking? Authorizing Provider  amLODipine  (NORVASC ) 5 MG tablet Take 2 tablets (10 mg total) by mouth daily. 12/14/23   Samtani, Jai-Gurmukh, MD  atorvastatin  (LIPITOR) 10 MG tablet Take 10 mg by mouth in the morning.    [provider]  dexamethasone  (DECADRON ) 1 MG tablet Take 2 tablets (2 mg total) by mouth daily with breakfast. Start 1 mg daily after finish the 2 mg tabs as taper per other prescription. 08/14/24   Tina Pauletta BROCKS, MD  dronabinol  (MARINOL ) 5 MG capsule Take 1 capsule (5 mg total) by mouth 2 (two) times daily before a meal. Patient not taking: Reported on 08/14/2024 04/11/24   Tina Pauletta BROCKS, MD  EPINEPHrine  0.3 mg/0.3 mL IJ SOAJ injection Inject 0.3 mg into the muscle as needed for anaphylaxis.    [provider]  everolimus  (AFINITOR ) 10 MG tablet Take 1 tablet (10 mg total) by mouth daily. 06/05/24   Tina Pauletta BROCKS, MD  finasteride  (PROSCAR ) 5 MG tablet Take 5 mg by mouth in the morning. 03/15/20   [provider]  Grape Seed Extract 100 MG CAPS Take 300 mg by mouth. 300 mg    [provider]  latanoprost  (XALATAN ) 0.005 % ophthalmic solution 1 drop at  bedtime. 01/23/22   [provider]  lisinopril -hydrochlorothiazide  (ZESTORETIC ) 20-25 MG tablet 1 tablet Orally Once a day for 90 days    [provider]  Multiple Vitamins-Minerals (MULTIVITAMIN WITH MINERALS) tablet Take 1 tablet by mouth in the morning.    [provider]  nystatin  cream (MYCOSTATIN) Apply to affected area 2 times daily 09/01/24   Curatolo, Adam, DO  omeprazole  (PRILOSEC) 20 MG capsule TAKE 1 CAPSULE(20 MG) BY MOUTH DAILY 02/28/24   Tina Pauletta BROCKS, MD  OVER THE COUNTER MEDICATION Take 1 tablet by mouth in the morning. Lion's mane supplement    [provider]  sildenafil (VIAGRA) 100 MG tablet Take 100 mg by mouth as needed. 02/15/24   [provider]  tamsulosin  (FLOMAX ) 0.4 MG CAPS capsule Take 0.4 mg by mouth daily after breakfast.     [provider]  triamcinolone  ointment (KENALOG ) 0.5 % Apply 1 Application topically 2 (two) times daily. 07/17/24   Tina Pauletta BROCKS, MD   Allergies  Allergen Reactions   Bee Venom Anaphylaxis    Foaming at mouth, unresponsive   Aspirin Nausea And Vomiting and Other (See Comments)   Tadalafil     Other Reaction(s): made patient feel bad and urinate more   Triamterene-Hctz Nausea Only   Penicillins Itching and Rash   Review of Systems +weakness  Physical Exam Resting in bed Grunts at times Shallow regular breath sounds  Vital Signs: BP (!) 154/81 (BP Location: Right Arm)   Pulse 96   Temp (!) 102.5 F (39.2 C) (Rectal)   Resp 20   Ht 6' 3 (1.905 m)   Wt 87.7 kg   SpO2 (!) 89%   BMI 24.17 kg/m  Pain Scale: Not given for pain   Pain Score: 0-No pain   SpO2: SpO2: (!) 89 % O2 Device:SpO2: (!) 89 % O2 Flow Rate: .O2 Flow Rate (L/min): 4 L/min  IO: Intake/output summary:  Intake/Output Summary (Last 24 hours) at 09/13/2024 1051 Last data filed at 09/13/2024 0317 Gross per 24 hour  Intake 3885.13 ml  Output 200 ml  Net 3685.13 ml    LBM: Last BM Date : 09/11/24 Baseline Weight: Weight: 87.7 kg Most recent weight: Weight: 87.7 kg     Palliative Assessment/Data:   PPS 40%  Time In:  9 Time Out: 10.20  Time Total:  80 Greater than 50%  of this time was spent counseling and coordinating care related to the above assessment and plan.  Signed by: Lonia Serve, MD    Please contact Palliative Medicine Team phone at [phone] for questions and concerns.  For individual provider: See Tracey

## 2024-09-13 NOTE — Progress Notes (Signed)
 [facility] Progress Note Patient: [name] FMW:[id number] DOB: 1946/05/28  DOA: 09/12/2024 DOS: the patient was seen and examined on 09/13/2024  Brief Hospital Course: Patient with PMH of stage IV RCC with brain metastasis on chemoradiation, anorexia, GERD, BPH, HLD. Presented to the hospital with complaints of fever and chills and cough. Currently being treated for possible pneumonia. Palliative care also consulted for goals of care.  Assessment and Plan: Pneumonia. Community-acquired. Receiving IV antibiotic. Follow-up on cultures. RVP pending. COVID-negative.  RCC with brain metastasis. Had some dizziness. CT head unremarkable. No focal deficits. Outpatient follow-up with oncology. Plan for MRI in November.  Poor p.o. intake. Anorexia. Was on Decadron . Dose was increased recently and patient was asked to stop taking medication as he completed the regimen. Patient to restart it if the patient's oral intake does not improve.  Balanitis. Reportedly patient has been treated for with nystatin outpatient for possible balanitis. Will continue the same for now.  HTN. Patient is on amlodipine  at home. As well as lisinopril  HCTZ. Medication currently on hold. Receiving IV fluid.  BPH. On Flomax  and finasteride . Continue.  GERD. Continue PPI.  HLD. On statin. Currently on hold.  Mild elevation of LFT. Monitor for now.  Goals of care. Perative care following. Monitor.  CKD stage IIIb. Baseline renal creatinine around 1.4 lately. Currently stable. Monitor.  Subjective: Feeling better.  No nausea no vomiting.  No headache.  Feels chills.  Had fever this morning which is improving and responding to IV Tylenol .  No diarrhea.  Physical Exam: Clear to auscultation. S1-S2 present Bowel sound present Nontender. Trace edema. No focal deficit. Pupils are equal and reactive to light.  Data Reviewed: I have Reviewed nursing notes, Vitals, and Lab  results. Since last encounter, pertinent lab results CBC and BMP   . I have ordered test including CBC and BMP  .   Disposition: Status is: Inpatient Remains inpatient appropriate because: Monitor for improvement in fever  heparin  injection 5,000 Units Start: 09/13/24 1400   Family Communication: Discussed with family at bedside. Level of care: Med-Surg   Vitals:   09/13/24 0615 09/13/24 0630 09/13/24 0819 09/13/24 1236  BP:   (!) 154/81 116/74  Pulse:   96 94  Resp:   20 18  Temp: (!) 102.6 F (39.2 C) (!) 103.7 F (39.8 C) (!) 102.5 F (39.2 C) (!) 100.8 F (38.2 C)  TempSrc: Oral Rectal Rectal   SpO2:   (!) 89% (!) 88%  Weight:      Height:         Author: Yetta Blanch, MD 09/13/2024 6:27 PM  Please look on [email or web address] to find out who is on call.

## 2024-09-14 DIAGNOSIS — Z7189 Other specified counseling: Secondary | ICD-10-CM | POA: Diagnosis not present

## 2024-09-14 DIAGNOSIS — J188 Other pneumonia, unspecified organism: Secondary | ICD-10-CM

## 2024-09-14 DIAGNOSIS — R531 Weakness: Secondary | ICD-10-CM | POA: Diagnosis not present

## 2024-09-14 DIAGNOSIS — Z515 Encounter for palliative care: Secondary | ICD-10-CM

## 2024-09-14 LAB — CBC WITH DIFFERENTIAL/PLATELET
Abs Immature Granulocytes: 0.38 K/uL — ABNORMAL HIGH (ref 0.00–0.07)
Basophils Absolute: 0.1 K/uL (ref 0.0–0.1)
Basophils Relative: 1 %
Eosinophils Absolute: 0 K/uL (ref 0.0–0.5)
Eosinophils Relative: 0 %
HCT: 37.4 % — ABNORMAL LOW (ref 39.0–52.0)
Hemoglobin: 11.8 g/dL — ABNORMAL LOW (ref 13.0–17.0)
Immature Granulocytes: 6 %
Lymphocytes Relative: 15 %
Lymphs Abs: 1 K/uL (ref 0.7–4.0)
MCH: 25.5 pg — ABNORMAL LOW (ref 26.0–34.0)
MCHC: 31.6 g/dL (ref 30.0–36.0)
MCV: 81 fL (ref 80.0–100.0)
Monocytes Absolute: 0.2 K/uL (ref 0.1–1.0)
Monocytes Relative: 3 %
Neutro Abs: 5.1 K/uL (ref 1.7–7.7)
Neutrophils Relative %: 75 %
Platelets: 199 K/uL (ref 150–400)
RBC: 4.62 MIL/uL (ref 4.22–5.81)
RDW: 15.5 % (ref 11.5–15.5)
WBC: 6.8 K/uL (ref 4.0–10.5)
nRBC: 0.6 % — ABNORMAL HIGH (ref 0.0–0.2)

## 2024-09-14 LAB — COMPREHENSIVE METABOLIC PANEL WITH GFR
ALT: 44 U/L (ref 0–44)
AST: 89 U/L — ABNORMAL HIGH (ref 15–41)
Albumin: 3 g/dL — ABNORMAL LOW (ref 3.5–5.0)
Alkaline Phosphatase: 108 U/L (ref 38–126)
Anion gap: 16 — ABNORMAL HIGH (ref 5–15)
BUN: 15 mg/dL (ref 8–23)
CO2: 22 mmol/L (ref 22–32)
Calcium: 8.9 mg/dL (ref 8.9–10.3)
Chloride: 94 mmol/L — ABNORMAL LOW (ref 98–111)
Creatinine, Ser: 1.27 mg/dL — ABNORMAL HIGH (ref 0.61–1.24)
GFR, Estimated: 59 mL/min — ABNORMAL LOW (ref >60)
Glucose, Bld: 109 mg/dL — ABNORMAL HIGH (ref 70–99)
Potassium: 3.7 mmol/L (ref 3.5–5.1)
Sodium: 132 mmol/L — ABNORMAL LOW (ref 135–145)
Total Bilirubin: 0.5 mg/dL (ref 0.0–1.2)
Total Protein: 6.1 g/dL — ABNORMAL LOW (ref 6.5–8.1)

## 2024-09-14 LAB — MAGNESIUM: Magnesium: 1.7 mg/dL (ref 1.7–2.4)

## 2024-09-14 LAB — STREP PNEUMONIAE URINARY ANTIGEN: Strep Pneumo Urinary Antigen: NEGATIVE

## 2024-09-14 LAB — MRSA NEXT GEN BY PCR, NASAL: MRSA by PCR Next Gen: NOT DETECTED

## 2024-09-14 MED ORDER — LACTATED RINGERS IV SOLN: INTRAVENOUS | Status: AC

## 2024-09-14 MED ORDER — SODIUM CHLORIDE 0.9 % IV SOLN
2.0000 g | Freq: Two times a day (BID) | INTRAVENOUS | Status: DC
  Administered 2024-09-14 – 2024-09-16 (×5): 2 g via INTRAVENOUS
  Filled 2024-09-14 (×5): qty 12.5

## 2024-09-14 MED ORDER — VANCOMYCIN HCL 1750 MG/350ML IV SOLN
1750.0000 mg | INTRAVENOUS | Status: DC
  Administered 2024-09-14 – 2024-09-16 (×3): 1750 mg via INTRAVENOUS
  Filled 2024-09-14 (×3): qty 350

## 2024-09-14 MED ORDER — LACTATED RINGERS IV BOLUS
500.0000 mL | Freq: Once | INTRAVENOUS | Status: AC
  Administered 2024-09-14: 500 mL via INTRAVENOUS

## 2024-09-14 NOTE — Progress Notes (Signed)
 Daily Progress Note   Patient Name: [name]       Date: 09/14/2024 DOB: 01-21-1946  Age: 78 y.o. MRN#: [id number] Attending Physician: Raenelle Coria, MD Primary Care Physician: Seabron Lenis, MD Admit Date: 09/12/2024  Reason for Consultation/Follow-up: Establishing goals of care  Subjective:  Resting in bed, wife at bedside. Continues to appear with generalized weakness.   Length of Stay: 2  Current Medications: Scheduled Meds:   acetaminophen   650 mg Oral QID   finasteride   5 mg Oral q AM   heparin  injection (subcutaneous)  5,000 Units Subcutaneous Q8H   latanoprost   1 drop Both Eyes QHS   nystatin cream   Topical BID   pantoprazole   40 mg Oral Daily   tamsulosin   0.4 mg Oral QPC breakfast    Continuous Infusions:  ceFEPime (MAXIPIME) IV     lactated ringers      vancomycin      PRN Meds: bisacodyl , ondansetron  **OR** ondansetron  (ZOFRAN ) IV  Physical Exam         Resting in bed Generalized weakness Has cold compress on forehead Trace edema  Vital Signs: BP (!) 143/98   Pulse (!) 110   Temp (!) 102.8 F (39.3 C) (Axillary)   Resp (!) 24   Ht 6' 3 (1.905 m)   Wt 87.7 kg   SpO2 95%   BMI 24.17 kg/m  SpO2: SpO2: 95 % O2 Device: O2 Device: Nasal Cannula O2 Flow Rate: O2 Flow Rate (L/min): 4 L/min  Intake/output summary:  Intake/Output Summary (Last 24 hours) at 09/14/2024 0924 Last data filed at 09/14/2024 0430 Gross per 24 hour  Intake 503.99 ml  Output 600 ml  Net -96.01 ml   LBM: Last BM Date : 09/13/24 Baseline Weight: Weight: 87.7 kg Most recent weight: Weight: 87.7 kg       Palliative Assessment/Data:      Patient Active Problem List   Diagnosis Date Noted   Pneumonia 09/12/2024   Thrombocytopenia 08/28/2024   Congestion of  throat 08/28/2024   Proteinuria 08/26/2024   Incontinence of urine 08/14/2024   Muscle weakness 08/14/2024   Goals of care, counseling/discussion 06/02/2024   Dehydration 05/22/2024   Rash 04/03/2024   Decreased appetite 04/03/2024   Mouth pain 02/21/2024   Fatigue 02/21/2024   Constipation 01/31/2024   GERD (gastroesophageal  reflux disease) 01/31/2024   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   Dilated intrahepatic bile duct 01/30/2024   Postural dizziness with presyncope 12/13/2023   Controlled type 2 diabetes mellitus without complication, without long-term current use of insulin (HCC) 12/13/2023   CKD (chronic kidney disease) 11/29/2023   At risk for side effect of medication 10/23/2023   Microcytic anemia 10/09/2023   Renal cell carcinoma, right (HCC) 10/08/2023   Clot retention of urine 04/17/2020   Hematuria 04/15/2020   Genetic testing 05/02/2018   Family history of prostate cancer    Family history of cancer    Malignant neoplasm of prostate (HCC) 04/01/2018   Hypotension 01/24/2014   Acute blood loss anemia 01/23/2014   Other and unspecified hyperlipidemia 01/23/2014   Benign prostatic hyperplasia 01/23/2014   Lower GI bleed 01/23/2014   HTN (hypertension) 08/11/2013   Diverticulosis of colon with hemorrhage 12/27/2011   ADENOMATOUS COLONIC POLYP 01/06/2008   Internal hemorrhoids 01/06/2008    Palliative Care Assessment & Plan   Patient Profile:   Stage IV renal cell carcinoma with brain mets on chemoradiation, GERD, BPH, hyperlipidemia presented with 2 days of high fever and chills with sore throat at home.  Imaging studies consistent with bilateral lower lobe pneumonia/pneumonitis.  Patient remains persistently febrile despite being on antibiotics for last 2 days.  Cultures are negative.  Assessment:     Bilateral lower lobe pneumonia/multifocal pneumonia in a patient on chemotherapy: Persistently febrile.  Also possible metabolic fever from  the cancer.  Does have evidence of parenchymal disease on both lower lobes.  Fever not coming down with conservative measures. To be transferred to stepdown unit and wide cooling blankets.  On around-the-clock Tylenol .  Various methods to decrease the temperature. Discontinue azithromycin and Rocephin, to be started on broad spectrum antibiotics vancomycin and cefepime, discussed witrh wife.  Blood cultures negative.  Repeat blood cultures to be done today.  Recommendations/Plan:  DNR DNI Time limited trial of current interventions Palliative team has discussed about hospice care at the time of initial consult, monitoring hospital course for now and PMT remains available to engage with patient/wife for ongoing goals of care discussions.   Goals of Care and Additional Recommendations: Limitations on Scope of Treatment: DNR DNI  Code Status:    Code Status Orders  (From admission, onward)           Start     Ordered   09/13/24 1051  Do not attempt resuscitation (DNR)- Limited -Do Not Intubate (DNI)  (Code Status)  Continuous       Question Answer Comment  If pulseless and not breathing No CPR or chest compressions.   In Pre-Arrest Conditions (Patient Is Breathing and Has A Pulse) Do not intubate. Provide all appropriate non-invasive medical interventions. Avoid ICU transfer unless indicated or required.   Consent: Discussion documented in EHR or advanced directives reviewed      09/13/24 1050           Code Status History     Date Active Date Inactive Code Status Order ID Comments User Context   09/12/2024 2004 09/13/2024 1050 Full Code [id number]  Arthea Child, MD ED   12/13/2023 1709 12/14/2023 1503 Full Code [id number]  Sonjia Held, MD ED   04/17/2020 0001 04/18/2020 1500 Full Code [id number]  Watt Rush, MD Inpatient   04/15/2020 1150 04/16/2020 1952 Full Code [id number]  Matilda Senior, MD Inpatient   01/23/2014 0136 01/26/2014 1556 Full Code [id number]  Billy Jun  M, MD Inpatient   08/09/2013 2036 08/12/2013 1418 Full Code [id number]  Franky Redia SAILOR, MD ED   12/28/2011 0149 12/31/2011 1502 Full Code [id number]  Megan Emmalene SAUNDERS, RN Inpatient      Advance Directive Documentation    Flowsheet Row Most Recent Value  Type of Advance Directive Living will  Pre-existing out of facility DNR order (yellow form or pink MOST form) --  MOST Form in Place? --    Prognosis:  guarded  Discharge Planning: To Be Determined  Care plan was discussed with  patient, wife. Mod MDM.   Thank you for allowing the Palliative Medicine Team to assist in the care of this patient.   Greater than 50%  of this time was spent counseling and coordinating care related to the above assessment and plan.  Lonia Serve, MD  Please contact Palliative Medicine Team phone at [phone] for questions and concerns.

## 2024-09-14 NOTE — Progress Notes (Signed)
 Pharmacy Antibiotic Note  [name] is a 78 y.o. male admitted on 09/12/2024 with pneumonia.  Initially started on azithromycin and one dose of cefepime and Flagyl on 10/17.  Today, Pharmacy has been consulted for cefepime and vancomycin dosing for sepsis.  Plan: Cefepime 2 g IV every 12 hours Vancomycin 1750 mg IV every 24 hours (Goal AUC 400-550, eAUC 518.1, SCr used: 1.27) Monitor clinical progress, renal function, vancomycin levels as indicated F/U MRSA PCR, C&S, abx deescalation / LOT   Height: 6' 3 (190.5 cm) Weight: 87.7 kg (193 lb 6.2 oz) IBW/kg (Calculated) : 84.5  Temp (24hrs), Avg:101.5 F (38.6 C), Min:99.6 F (37.6 C), Max:102.8 F (39.3 C)  Recent Labs  Lab 09/11/24 0841 09/12/24 1407 09/12/24 1502 09/12/24 1558 09/13/24 0056 09/13/24 0206 09/14/24 0451 09/14/24 0648  WBC 6.0  --   --  5.1 5.7  --  6.8  --   CREATININE 1.93*  --  1.51*  --  1.48*  --   --  1.27*  LATICACIDVEN  --  1.6  --   --   --  1.5  --   --     Estimated Creatinine Clearance: 59.1 mL/min (A) (by C-G formula based on SCr of 1.27 mg/dL (H)).    Allergies  Allergen Reactions   Bee Venom Anaphylaxis    Foaming at mouth, unresponsive   Aspirin Nausea And Vomiting and Other (See Comments)   Tadalafil     Other Reaction(s): made patient feel bad and urinate more   Triamterene-Hctz Nausea Only   Penicillins Itching and Rash    Antimicrobials this admission: 10/17 cefepime, vancomycin, flagyl x 1 10/17 azith >> 10/18 10/19 cefepime >>  10/19 vancomycin >>   Microbiology results: 10/19 BCx: sent 10/18 resp panel: neg 10/17 BCx: ngtd 10/17 UCx: ngf  10/19 MRSA PCR: ordered  Thank you for allowing pharmacy to be a part of this patient's care.  Eleanor Lynwood, PharmD, BCPS 09/14/2024 10:51 AM

## 2024-09-14 NOTE — Progress Notes (Signed)
 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  Imaging studies consistent with bilateral lower lobe pneumonia/pneumonitis.  Patient remains persistently febrile despite being on antibiotics for last 2 days.  Cultures are negative.  Subjective: Patient seen and examined.  Patient with chills and rigor.  Temperature 102.  Wife at the bedside.  He just does not feel well.  Remains on room air.  Feels tired and fatigued.  Has some dry cough and occasional mucoid sputum. Assessment & Plan:   Bilateral lower lobe pneumonia/multifocal pneumonia in a patient on chemotherapy: Persistently febrile.  Also possible metabolic fever from the cancer.  Does have evidence of parenchymal disease on both lower lobes.  Fever not coming down with conservative measures. Transferred to stepdown unit and wide cooling blankets.  On around-the-clock Tylenol .  Various methods to decrease the temperature. Discontinue azithromycin and Rocephin, started on vancomycin and cefepime. Blood cultures negative.  Repeat blood cultures today. COVID, flu, HSV negative.  Extended respiratory virus panel negative.  Patient does not have any indwelling port. Chest physiotherapy, incentive spirometry, deep breathing exercises, sputum induction, mucolytic's and bronchodilators. Sputum cultures, Legionella and streptococcal antigen. Supplemental oxygen to keep saturations more than 90%.  RCC with brain mets: Followed by oncology.  Remains in poor clinical status.  Further management as per oncology.  Seen by palliative care.  Currently DNR with limited interventions.  Goal of care will depend on his outcome from current hospitalization.  BPH, continue on Flomax  and finasteride .  GERD: On  PPI.  Hyperlipidemia: Statin on hold.  Essential hypertension: Blood pressure stable, however risk of hypotension.  Holding antihypertensives.  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    DVT prophylaxis: heparin  injection 5,000 Units Start: 09/13/24 1400   Code Status: DNR with limited intervention Family Communication: Wife at the bedside Disposition Plan: Status is: Inpatient Remains inpatient appropriate because: Persistent fever, transferring to stepdown unit     Consultants:  Palliative care Oncology  Procedures:  None  Antimicrobials:  Rocephin azithromycin 10/17-10/19 IV vancomycin and cefepime 10/19---     Objective: Vitals:   09/14/24 0105 09/14/24 0430 09/14/24 0551 09/14/24 0734  BP: 139/78 (!) 143/93 (!) 152/83 (!) 143/98  Pulse: 98 100 (!) 108 (!) 110  Resp: 20 19 (!) 25 (!) 24  Temp: (!) 101.3 F (38.5 C) (!) 102.6 F (39.2 C) (!) 102.6 F (39.2 C) (!) 102.8 F (39.3 C)  TempSrc: Oral Axillary Axillary Axillary  SpO2: 93% 90% 92% 95%  Weight:      Height:        Intake/Output Summary (Last 24 hours) at 09/14/2024 0901 Last data filed at 09/14/2024 0430 Gross per 24 hour  Intake 503.99 ml  Output 600 ml  Net -96.01 ml   Filed Weights   09/12/24 2129  Weight: 87.7 kg    Examination:  General exam: Appears calm, slightly anxious.  Uncomfortable with chills.  Febrile. Respiratory system: Clear to auscultation.  Poor inspiratory effort.  No added sounds.  On room air. Cardiovascular system: S1 & S2 heard, RRR. No pedal edema. Gastrointestinal system: Soft.  Nontender.  Bowel sound present. Central nervous system: Alert and oriented. No focal neurological deficits. Extremities: Symmetric 5 x 5 power.  Gross generalized weakness.  Data Reviewed: I have personally reviewed following labs and imaging studies  CBC: Recent Labs  Lab 09/11/24 0841 09/12/24 1558 09/13/24 0056 09/14/24 0451  WBC 6.0 5.1 5.7 6.8  NEUTROABS 4.4 3.5  --   5.1  HGB 11.8* 10.9* 11.0* 11.8*  HCT 35.3* 35.3* 36.0* 37.4*  MCV 79.0* 81.9 83.1 81.0  PLT 175 131* 176 199   Basic Metabolic Panel: Recent Labs  Lab 09/11/24 0841 09/12/24 1502 09/13/24 0056 09/14/24 0648  NA 131* 132* 133* 132*  K 3.6 3.6 3.7 3.7  CL 93* 95* 96* 94*  CO2 30 22 24 22   GLUCOSE 213* 153* 135* 109*  BUN 30* 25* 22 15  CREATININE 1.93* 1.51* 1.48* 1.27*  CALCIUM  9.6 9.2 9.1 8.9  MG  --   --  2.0 1.7   GFR: Estimated Creatinine Clearance: 59.1 mL/min (A) (by C-G formula based on SCr of 1.27 mg/dL (H)). Liver Function Tests: Recent Labs  Lab 09/11/24 0841 09/12/24 1502 09/14/24 0648  AST 69* 74* 89*  ALT 59* 54* 44  ALKPHOS 102 106 108  BILITOT 0.6 0.5 0.5  PROT 7.1 6.7 6.1*  ALBUMIN 3.5 3.3* 3.0*   Recent Labs  Lab 09/12/24 1502  LIPASE 60*   No results for input(s): AMMONIA in the last 168 hours. Coagulation Profile: Recent Labs  Lab 09/12/24 1502  INR 1.0   Cardiac Enzymes: No results for input(s): CKTOTAL, CKMB, CKMBINDEX, TROPONINI in the last 168 hours. BNP (last 3 results) No results for input(s): PROBNP in the last 8760 hours. HbA1C: No results for input(s): HGBA1C in the last 72 hours. CBG: No results for input(s): GLUCAP in the last 168 hours. Lipid Profile: No results for input(s): CHOL, HDL, LDLCALC, TRIG, CHOLHDL, LDLDIRECT in the last 72 hours. Thyroid  Function Tests: No results for input(s): TSH, T4TOTAL, FREET4, T3FREE, THYROIDAB in the last 72 hours. Anemia Panel: No results for input(s): VITAMINB12, FOLATE, FERRITIN, TIBC, IRON, RETICCTPCT in the last 72 hours. Sepsis Labs: Recent Labs  Lab 09/12/24 1407 09/13/24 0206  LATICACIDVEN 1.6 1.5    Recent Results (from the past 240 hours)  Resp panel by RT-PCR (RSV, Flu A&B, Covid) Anterior Nasal Swab     Status: None   Collection Time: 09/12/24  1:55 PM   Specimen: Anterior Nasal Swab  Result Value Ref Range Status    SARS Coronavirus 2 by RT PCR NEGATIVE NEGATIVE Final    Comment: (NOTE) SARS-CoV-2 target nucleic acids are NOT DETECTED.  The SARS-CoV-2 RNA is generally detectable in upper respiratory specimens during the acute phase of infection. The lowest concentration of SARS-CoV-2 viral copies this assay can detect is 138 copies/mL. A negative result does not preclude SARS-Cov-2 infection and should not be used as the sole basis for treatment or other patient management decisions. A negative result may occur with  improper specimen collection/handling, submission of specimen other than nasopharyngeal swab, presence of viral mutation(s) within the areas targeted by this assay, and inadequate number of viral copies(<138 copies/mL). A negative result must be combined with clinical observations, patient history, and epidemiological information. The expected result is Negative.  Fact Sheet for Patients:  BloggerCourse.com  Fact Sheet for Healthcare Providers:  SeriousBroker.it  This test is no t yet approved or cleared by the [place]  FDA and  has been authorized for detection and/or diagnosis of SARS-CoV-2 by FDA under an Emergency Use Authorization (EUA). This EUA will remain  in effect (meaning this test can be used) for the duration of  the COVID-19 declaration under Section 564(b)(1) of the Act, 21 U.S.C.section 360bbb-3(b)(1), unless the authorization is terminated  or revoked sooner.       Influenza A by PCR NEGATIVE NEGATIVE Final   Influenza B by PCR NEGATIVE NEGATIVE Final    Comment: (NOTE) The Xpert Xpress SARS-CoV-2/FLU/RSV plus assay is intended as an aid in the diagnosis of influenza from Nasopharyngeal swab specimens and should not be used as a sole basis for treatment. Nasal washings and aspirates are unacceptable for Xpert Xpress SARS-CoV-2/FLU/RSV testing.  Fact Sheet for  Patients: BloggerCourse.com  Fact Sheet for Healthcare Providers: SeriousBroker.it  This test is not yet approved or cleared by the [place]  FDA and has been authorized for detection and/or diagnosis of SARS-CoV-2 by FDA under an Emergency Use Authorization (EUA). This EUA will remain in effect (meaning this test can be used) for the duration of the COVID-19 declaration under Section 564(b)(1) of the Act, 21 U.S.C. section 360bbb-3(b)(1), unless the authorization is terminated or revoked.     Resp Syncytial Virus by PCR NEGATIVE NEGATIVE Final    Comment: (NOTE) Fact Sheet for Patients: BloggerCourse.com  Fact Sheet for Healthcare Providers: SeriousBroker.it  This test is not yet approved or cleared by the [place]  FDA and has been authorized for detection and/or diagnosis of SARS-CoV-2 by FDA under an Emergency Use Authorization (EUA). This EUA will remain in effect (meaning this test can be used) for the duration of the COVID-19 declaration under Section 564(b)(1) of the Act, 21 U.S.C. section 360bbb-3(b)(1), unless the authorization is terminated or revoked.  Performed at [facility], [address], [place], [place] [address]   Blood Culture (routine x 2)     Status: None (Preliminary result)   Collection Time: 09/12/24  1:55 PM   Specimen: BLOOD LEFT FOREARM  Result Value Ref Range Status   Specimen Description BLOOD LEFT FOREARM  Final   Special Requests   Final    BOTTLES DRAWN AEROBIC AND ANAEROBIC Blood Culture adequate volume   Culture   Final    NO GROWTH < 24 HOURS Performed at [facility], [address], [place], [place] [address]    Report Status PENDING  Incomplete  Blood Culture (routine x 2)     Status: None (Preliminary result)   Collection Time: 09/12/24  3:19 PM   Specimen: BLOOD  Result Value Ref Range Status    Specimen Description   Final    BLOOD LEFT ANTECUBITAL Performed at [facility], [address] [place], [place] [address]    Special Requests   Final    BOTTLES DRAWN AEROBIC AND ANAEROBIC Blood Culture adequate volume Performed at [facility], [address], [place], [place] [address]    Culture   Final    NO GROWTH < 24 HOURS Performed at [facility], [address], [place], [place] [address]    Report Status PENDING  Incomplete  Urine Culture (for pregnant, neutropenic or urologic patients or patients with an indwelling urinary catheter)     Status: None   Collection Time: 09/12/24  4:06 PM   Specimen: Urine, Clean Catch  Result Value Ref Range Status   Specimen Description   Final    URINE, CLEAN CATCH Performed at [facility], [address], [place], [place] [address]    Special Requests   Final    NONE Performed at [facility], [address] [place],  [place] [address]    Culture   Final    NO GROWTH Performed at [facility], [address], [place], [place] [address]    Report Status 09/13/2024 FINAL  Final  Respiratory (~20 pathogens) panel by PCR     Status: None   Collection Time: 09/13/24 12:42 PM   Specimen: Nasopharyngeal Swab; Respiratory  Result Value Ref Range Status   Adenovirus NOT DETECTED NOT DETECTED Final   Coronavirus 229E NOT DETECTED NOT DETECTED Final    Comment: (NOTE) The Coronavirus on the Respiratory Panel, DOES NOT test for the novel  Coronavirus (2019 nCoV)    Coronavirus HKU1 NOT DETECTED NOT DETECTED Final   Coronavirus NL63 NOT DETECTED NOT DETECTED Final   Coronavirus OC43 NOT DETECTED NOT DETECTED Final   Metapneumovirus NOT DETECTED NOT DETECTED Final   Rhinovirus / Enterovirus NOT DETECTED NOT DETECTED Final   Influenza A NOT DETECTED NOT DETECTED Final   Influenza B NOT DETECTED NOT DETECTED Final   Parainfluenza Virus 1 NOT  DETECTED NOT DETECTED Final   Parainfluenza Virus 2 NOT DETECTED NOT DETECTED Final   Parainfluenza Virus 3 NOT DETECTED NOT DETECTED Final   Parainfluenza Virus 4 NOT DETECTED NOT DETECTED Final   Respiratory Syncytial Virus NOT DETECTED NOT DETECTED Final   Bordetella pertussis NOT DETECTED NOT DETECTED Final   Bordetella Parapertussis NOT DETECTED NOT DETECTED Final   Chlamydophila pneumoniae NOT DETECTED NOT DETECTED Final   Mycoplasma pneumoniae NOT DETECTED NOT DETECTED Final    Comment: Performed at [facility], [address], [place], [place] [address]         Radiology Studies: CT Angio Chest PE W and/or Wo Contrast Result Date: 09/12/2024 EXAM: CTA CHEST PE  WITH CONTRAST CT ABDOMEN AND PELVIS  WITH CONTRAST 09/12/2024 06:06:11 PM TECHNIQUE: CTA of the chest was performed after the administration of intravenous contrast. Multiplanar reformatted images are provided for review. MIP images are provided for review. CT of the abdomen and pelvis was performed with the administration of intravenous contrast. Automated exposure control, iterative reconstruction, and/or weight based adjustment of the mA/kV was utilized to reduce the radiation dose to as low as reasonably achievable. COMPARISON: CT 06/11/2024. CLINICAL HISTORY: Pulmonary embolism (PE) suspected, high probability. Confusion, fever (103F), and decreased responsiveness. History of renal cell carcinoma with brain metastasis, on immunologic/chemotherapy meds (currently on hold). Denies seizures, headache, neck pain, nausea, vomiting, or diarrhea. Poor oral intake. Palliative care evaluation pending. FINDINGS: CHEST: PULMONARY ARTERIES: Pulmonary arteries are adequately opacified for evaluation. No intraluminal filling defect to suggest pulmonary embolism. Main pulmonary artery is normal in caliber. MEDIASTINUM: No mediastinal lymphadenopathy. The heart and pericardium demonstrate no acute abnormality. There is no acute  abnormality of the thoracic aorta. LUNGS AND PLEURA: Patchy bilateral ground-glass opacities in a peribronchovascular distribution with more confluent airspace opacities in the left greater than right lower lobes. Numerous pulmonary cysts. No pleural effusion or pneumothorax. Unchanged 6 mm nodule on the left lower lobe on series 9 image 243. SOFT TISSUES AND BONES: No acute bone or soft tissue abnormality. ABDOMEN AND PELVIS: LIVER: Hepatic steatosis. GALLBLADDER AND BILE DUCTS: Gallbladder is unremarkable. No biliary ductal dilatation. SPLEEN: Spleen demonstrates no acute abnormality. PANCREAS: Cystic lesion in the tail of the pancreas is unchanged again measuring 1.3 cm. No pancreatic ductal dilation or evidence of acute pancreatitis. ADRENAL GLANDS: Right adrenalectomy. Left adrenal cysts. KIDNEYS, URETERS AND BLADDER: Right nephrectomy. No stones in the left kidney or ureters. No hydronephrosis. No  perinephric or periureteral stranding. Urinary bladder is unremarkable. GI AND BOWEL: Stomach and duodenal sweep demonstrate no acute abnormality. There is no bowel obstruction. No abnormal bowel wall thickening or distension. Normal appendix. REPRODUCTIVE: Metallic seeds in the prostate. Enlarged prostate. PERITONEUM AND RETROPERITONEUM: No ascites or free air. LYMPH NODES: No lymphadenopathy. BONES AND SOFT TISSUES: No acute abnormality of the visualized bones. Similar mildly enhancing soft tissue nodule in the right nephrectomy bed overlying the most superior portion of the right psoas measuring 2.0 x 0.7 cm. IMPRESSION: 1. No evidence of pulmonary embolism. 2. Patchy bilateral ground-glass and confluent lower lobe airspace opacities. Differential considerations include multifocal pneumonia, edema, alveolar hemorrhage, or acute lung injury. 3. No acute abdominal or pelvic abnormality. Electronically signed by: Norman Gatlin MD 09/12/2024 06:55 PM EDT RP Workstation: [id number]   CT ABDOMEN PELVIS W  CONTRAST Result Date: 09/12/2024 EXAM: CTA CHEST PE  WITH CONTRAST CT ABDOMEN AND PELVIS  WITH CONTRAST 09/12/2024 06:06:11 PM TECHNIQUE: CTA of the chest was performed after the administration of intravenous contrast. Multiplanar reformatted images are provided for review. MIP images are provided for review. CT of the abdomen and pelvis was performed with the administration of intravenous contrast. Automated exposure control, iterative reconstruction, and/or weight based adjustment of the mA/kV was utilized to reduce the radiation dose to as low as reasonably achievable. COMPARISON: CT 06/11/2024. CLINICAL HISTORY: Pulmonary embolism (PE) suspected, high probability. Confusion, fever (103F), and decreased responsiveness. History of renal cell carcinoma with brain metastasis, on immunologic/chemotherapy meds (currently on hold). Denies seizures, headache, neck pain, nausea, vomiting, or diarrhea. Poor oral intake. Palliative care evaluation pending. FINDINGS: CHEST: PULMONARY ARTERIES: Pulmonary arteries are adequately opacified for evaluation. No intraluminal filling defect to suggest pulmonary embolism. Main pulmonary artery is normal in caliber. MEDIASTINUM: No mediastinal lymphadenopathy. The heart and pericardium demonstrate no acute abnormality. There is no acute abnormality of the thoracic aorta. LUNGS AND PLEURA: Patchy bilateral ground-glass opacities in a peribronchovascular distribution with more confluent airspace opacities in the left greater than right lower lobes. Numerous pulmonary cysts. No pleural effusion or pneumothorax. Unchanged 6 mm nodule on the left lower lobe on series 9 image 243. SOFT TISSUES AND BONES: No acute bone or soft tissue abnormality. ABDOMEN AND PELVIS: LIVER: Hepatic steatosis. GALLBLADDER AND BILE DUCTS: Gallbladder is unremarkable. No biliary ductal dilatation. SPLEEN: Spleen demonstrates no acute abnormality. PANCREAS: Cystic lesion in the tail of the pancreas is  unchanged again measuring 1.3 cm. No pancreatic ductal dilation or evidence of acute pancreatitis. ADRENAL GLANDS: Right adrenalectomy. Left adrenal cysts. KIDNEYS, URETERS AND BLADDER: Right nephrectomy. No stones in the left kidney or ureters. No hydronephrosis. No perinephric or periureteral stranding. Urinary bladder is unremarkable. GI AND BOWEL: Stomach and duodenal sweep demonstrate no acute abnormality. There is no bowel obstruction. No abnormal bowel wall thickening or distension. Normal appendix. REPRODUCTIVE: Metallic seeds in the prostate. Enlarged prostate. PERITONEUM AND RETROPERITONEUM: No ascites or free air. LYMPH NODES: No lymphadenopathy. BONES AND SOFT TISSUES: No acute abnormality of the visualized bones. Similar mildly enhancing soft tissue nodule in the right nephrectomy bed overlying the most superior portion of the right psoas measuring 2.0 x 0.7 cm. IMPRESSION: 1. No evidence of pulmonary embolism. 2. Patchy bilateral ground-glass and confluent lower lobe airspace opacities. Differential considerations include multifocal pneumonia, edema, alveolar hemorrhage, or acute lung injury. 3. No acute abdominal or pelvic abnormality. Electronically signed by: Norman Gatlin MD 09/12/2024 06:55 PM EDT RP Workstation: [id number]   CT Head Wo Contrast Result Date:  09/12/2024 CLINICAL DATA:  Worsening headache. Fever. Undergoing chemotherapy for renal cell carcinoma. EXAM: CT HEAD WITHOUT CONTRAST TECHNIQUE: Contiguous axial images were obtained from the base of the skull through the vertex without intravenous contrast. RADIATION DOSE REDUCTION: This exam was performed according to the departmental dose-optimization program which includes automated exposure control, adjustment of the mA and/or kV according to patient size and/or use of iterative reconstruction technique. COMPARISON:  09/01/2024 FINDINGS: Brain: No evidence of intracranial hemorrhage, acute infarction, hydrocephalus, extra-axial  collection, or mass lesion/mass effect. Stable mild cerebral atrophy and moderate chronic small vessel disease. 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 Several small cystic lesions in the medial left frontal and right parietal lobes remains stable. Vascular:  No hyperdense vessel or other acute findings. Skull: No evidence of fracture or other significant bone abnormality. Sinuses/Orbits:  No acute findings. Other: None. IMPRESSION: No acute intracranial abnormality. Stable cerebral atrophy and chronic small vessel disease. a Stable small cystic lesions in the medial left frontal and right parietal lobes. Electronically Signed   By: Norleen DELENA Kil M.D.   On: 09/12/2024 18:27   DG Chest Portable 1 View Result Date: 09/12/2024 CLINICAL DATA:  fever EXAM: PORTABLE CHEST - 1 VIEW COMPARISON:  09/01/2024 FINDINGS: Mild elevation of the right hemidiaphragm due to low lung volumes. No focal airspace consolidation, pleural effusion, or pneumothorax. The cardiac silhouette is at the upper limits of normal, likely accentuated by AP technique and low lung volumes. Sternotomy wires. Tortuous aorta with aortic atherosclerosis. No acute fracture or destructive lesions. Multilevel thoracic osteophytosis. IMPRESSION: Low lung volumes.  Otherwise, no acute cardiopulmonary abnormality. Electronically Signed   By: Rogelia Myers M.D.   On: 09/12/2024 15:04        Scheduled Meds:  acetaminophen   650 mg Oral QID   finasteride   5 mg Oral q AM   heparin  injection (subcutaneous)  5,000 Units Subcutaneous Q8H   latanoprost   1 drop Both Eyes QHS   nystatin cream   Topical BID   pantoprazole   40 mg Oral Daily   tamsulosin   0.4 mg Oral QPC breakfast   Continuous Infusions:  lactated ringers        LOS: 2 days    Time spent: 55 minutes    Renato Applebaum, MD [facility]

## 2024-09-14 NOTE — Progress Notes (Signed)
 Pt temperature remains 102.5 despite cooling interventions.  Spoke with [facility],NP pt will receive new orders.

## 2024-09-14 NOTE — Progress Notes (Signed)
 Notified that patient's level of care had been changed to stepdown with goal of placing patient on cooling blanket. At this time, only a code bed is open on ICU/SD.  Rounded on patient with bedside RN and NT. Ice packs were already applied to patient's neck, bilateral axilla, groin, and behind knees. Per NT, ice packs had been refreshed at shift change, and were refreshed again while rounding on patient. Pillowcase soaked in ice water was draped around patient's neck with washcloths as skin barrier. Sheet removed from patient, reiterated to patient and spouse that patient does need any covers on, other than a hospital gown. Temperature in room reduced to lowest setting. Confirmed with wife that she understood that the driver of the patient's fever was likely to be his brain metastasis, and therefore it was very possible that any and all interventions will be minimally effective. Wife stated she understood, but the only way to know is to try. Patient discussed with attending physician and house supervisor.  650mg  acetaminophen  administered by bedside RN at this time. Of note, most recent dose of acetaminophen  prior to 0911 dose was eleven hours prior at 2208.

## 2024-09-14 NOTE — Plan of Care (Signed)

## 2024-09-14 NOTE — Progress Notes (Signed)
 Pt temp elevated. NP on duty notified. Pt A/O. Pt unable to take Tylenol  d/t pt has reached maximum dosage 4,000 mg within 24 hour period. Ibuprofen contraindicated d/t pt's kidney condition. NP recommendations charted per rapid response at pt bedside. (See orders). Pt resting comfortably in no apparent distress at this time. RN will continue to monitor.

## 2024-09-15 ENCOUNTER — Telehealth: Payer: Self-pay | Admitting: *Deleted

## 2024-09-15 DIAGNOSIS — J188 Other pneumonia, unspecified organism: Secondary | ICD-10-CM | POA: Diagnosis not present

## 2024-09-15 DIAGNOSIS — Z7189 Other specified counseling: Secondary | ICD-10-CM | POA: Diagnosis not present

## 2024-09-15 DIAGNOSIS — Z515 Encounter for palliative care: Secondary | ICD-10-CM | POA: Diagnosis not present

## 2024-09-15 DIAGNOSIS — R531 Weakness: Secondary | ICD-10-CM | POA: Diagnosis not present

## 2024-09-15 LAB — GLUCOSE, CAPILLARY: Glucose-Capillary: 115 mg/dL — ABNORMAL HIGH (ref 70–99)

## 2024-09-15 LAB — CG4 I-STAT (LACTIC ACID): Lactic Acid, Venous: 1.3 mmol/L (ref 0.5–1.9)

## 2024-09-15 MED ORDER — MORPHINE SULFATE (CONCENTRATE) 10 MG /0.5 ML PO SOLN: 10.0000 mg | ORAL | Status: DC | PRN

## 2024-09-15 MED ORDER — LORAZEPAM 2 MG/ML PO CONC: 0.5000 mg | ORAL | Status: DC | PRN

## 2024-09-15 NOTE — Telephone Encounter (Signed)
 Wife called to let Dr Tina know that [name] is in the hospital.

## 2024-09-15 NOTE — Progress Notes (Signed)
 Came to bedside as requested by bedside RN. Patient with intermittent fevers and shortness of breath. Bedside RN provided tepid bath and ice packs that helped palliate fever. Breathing calmer and patient denied distress. Current temperature 97.9 F, therefore, allowed sheet/blanket to help with shivering. Spent time with wife providing active listening and discussion about disease progression and end of life care. Wife expressed understanding about patient in dying process and gaining acceptance of this. Wife verbalized appreciation for time and education about hospice and palliative care. Bedside RN will notify provider/RR if any unmanaged symptoms or needs.

## 2024-09-15 NOTE — Progress Notes (Signed)
 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  Imaging studies consistent with bilateral lower lobe pneumonia/pneumonitis.  Patient remains persistently febrile despite being on antibiotics for last 2 days.  Cultures are negative.  Subjective:  Patient seen and examined.  Wife at the bedside.  Patient looks more lethargic and difficult to have conversation today.  He has occasional headache.  He himself denies any complaints.  Wife reported having some hallucinations.  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 Temperature slightly down however persistent.  See goal of care discussion below.  Assessment & Plan:   Bilateral lower lobe pneumonia/multifocal pneumonia in a patient on chemotherapy: Persistently febrile.  Also possible metabolic fever from the cancer.  Does have evidence of parenchymal disease on both lower lobes.  Previously on Rocephin azithromycin-started on vancomycin and cefepime 10/19. Provide all measures to decrease the temperature.  Repeat blood cultures negative. COVID, flu, HSV negative.  Extended respiratory virus panel negative.  Patient does not have any indwelling port. Supplemental oxygen to keep saturations more than 90%.  RCC with brain mets: Followed by oncology.  Remains in poor clinical status.  Further management as per oncology.  Seen by palliative care.  Currently DNR with limited interventions.  Goal of care will depend on his outcome from current hospitalization.  BPH, continue on Flomax  and finasteride .  GERD: On PPI.  Hyperlipidemia: Statin on hold.  Essential hypertension: Blood pressure stable, however risk of hypotension.  Holding antihypertensives.  CKD stage IIIb: At about  baseline.  Goal of care discussion:  Interdisciplinary Goals of Care Family Meeting   Date carried out: 09/15/2024  Location of the meeting: Bedside  Member's involved: Physician and Family Member or next of kin  Durable Power of Attorney or acting medical decision maker: Wife   Almarie Brownfield  Discussion: We discussed goals of care for [name] .  As previously being discussed with patient and wife at the bedside.  Patient has been deteriorating and now remains more lethargic, difficult to move.  Persistent fever.  Poor oral intake.  Given overall clinical status, metastatic cancer appropriate for hospice level of care.  Patient is lethargic and unable to participate much in conversation.  Patient's wife understands the severity of condition and accepts the outcome. We discussed about continuing IV fluid and antibiotics today and at the same time focus on more comfort measures, do not escalate care.  We discussed about changing him to full comfort measures if any decompensation or deterioration of the condition to avoid Mr. Higginson to go through any unnecessary intervention that may not be helpful and also to avoid any discomfort.  Code status:   Code Status: Do not attempt resuscitation (DNR) - Comfort care   Disposition: Continue current acute care, change CODE STATUS to DNR with comfort measures.    DVT prophylaxis: heparin  injection 5,000 Units Start: 09/13/24 1400   Code Status: DNR with comfort measures Family Communication: Wife at the bedside Disposition Plan: Status is: Inpatient Remains inpatient appropriate because: Critically ill.  Persistent fever.     Consultants:  Palliative care Oncology  Procedures:  None  Antimicrobials:  Rocephin azithromycin 10/17-10/19 IV vancomycin and cefepime 10/19---     Objective: Vitals:  09/14/24 2041 09/15/24 0333 09/15/24 0410 09/15/24 0554  BP:  128/81 (!) 145/77   Pulse:  (!) 107 (!) 110 100  Resp:   19 16   Temp:  97.9 F (36.6 C)  (!) 100.6 F (38.1 C)  TempSrc:  Oral  Oral  SpO2: 96% 93%    Weight:      Height:        Intake/Output Summary (Last 24 hours) at 09/15/2024 1120 Last data filed at 09/15/2024 0358 Gross per 24 hour  Intake 865 ml  Output 600 ml  Net 265 ml   Filed Weights   09/12/24 2129  Weight: 87.7 kg    Examination:  General exam: Lethargic.  Mostly sleepy.  Difficult to keep awake.  Follows simple commands.  Low-grade fever. Respiratory system: Clear to auscultation.  Poor inspiratory effort.  Conducted upper airway sounds.  On room air. Cardiovascular system: S1 & S2 heard, RRR. No pedal edema. Gastrointestinal system: Soft.  Nontender.  Bowel sound present. Central nervous system: Alert and awake.  Difficult to keep conversation due to lethargy.  Gross generalized weakness.  No focal neurological deficits. Extremities: Symmetric 5 x 5 power.     Data Reviewed: I have personally reviewed following labs and imaging studies  CBC: Recent Labs  Lab 09/11/24 0841 09/12/24 1558 09/13/24 0056 09/14/24 0451  WBC 6.0 5.1 5.7 6.8  NEUTROABS 4.4 3.5  --  5.1  HGB 11.8* 10.9* 11.0* 11.8*  HCT 35.3* 35.3* 36.0* 37.4*  MCV 79.0* 81.9 83.1 81.0  PLT 175 131* 176 199   Basic Metabolic Panel: Recent Labs  Lab 09/11/24 0841 09/12/24 1502 09/13/24 0056 09/14/24 0648  NA 131* 132* 133* 132*  K 3.6 3.6 3.7 3.7  CL 93* 95* 96* 94*  CO2 30 22 24 22   GLUCOSE 213* 153* 135* 109*  BUN 30* 25* 22 15  CREATININE 1.93* 1.51* 1.48* 1.27*  CALCIUM  9.6 9.2 9.1 8.9  MG  --   --  2.0 1.7   GFR: Estimated Creatinine Clearance: 59.1 mL/min (A) (by C-G formula based on SCr of 1.27 mg/dL (H)). Liver Function Tests: Recent Labs  Lab 09/11/24 0841 09/12/24 1502 09/14/24 0648  AST 69* 74* 89*  ALT 59* 54* 44  ALKPHOS 102 106 108  BILITOT 0.6 0.5 0.5  PROT 7.1 6.7 6.1*  ALBUMIN 3.5 3.3* 3.0*   Recent Labs  Lab 09/12/24 1502  LIPASE 60*   No results for  input(s): AMMONIA in the last 168 hours. Coagulation Profile: Recent Labs  Lab 09/12/24 1502  INR 1.0   Cardiac Enzymes: No results for input(s): CKTOTAL, CKMB, CKMBINDEX, TROPONINI in the last 168 hours. BNP (last 3 results) No results for input(s): PROBNP in the last 8760 hours. HbA1C: No results for input(s): HGBA1C in the last 72 hours. CBG: Recent Labs  Lab 09/15/24 0405  GLUCAP 115*   Lipid Profile: No results for input(s): CHOL, HDL, LDLCALC, TRIG, CHOLHDL, LDLDIRECT in the last 72 hours. Thyroid  Function Tests: No results for input(s): TSH, T4TOTAL, FREET4, T3FREE, THYROIDAB in the last 72 hours. Anemia Panel: No results for input(s): VITAMINB12, FOLATE, FERRITIN, TIBC, IRON, RETICCTPCT in the last 72 hours. Sepsis Labs: Recent Labs  Lab 09/12/24 1407 09/12/24 1619 09/13/24 0206  LATICACIDVEN 1.6 1.3 1.5    Recent Results (from the past 240 hours)  Resp panel by RT-PCR (RSV, Flu A&B, Covid) Anterior Nasal Swab     Status: None   Collection Time: 09/12/24  1:55 PM  Specimen: Anterior Nasal Swab  Result Value Ref Range Status   SARS Coronavirus 2 by RT PCR NEGATIVE NEGATIVE Final    Comment: (NOTE) SARS-CoV-2 target nucleic acids are NOT DETECTED.  The SARS-CoV-2 RNA is generally detectable in upper respiratory specimens during the acute phase of infection. The lowest concentration of SARS-CoV-2 viral copies this assay can detect is 138 copies/mL. A negative result does not preclude SARS-Cov-2 infection and should not be used as the sole basis for treatment or other patient management decisions. A negative result may occur with  improper specimen collection/handling, submission of specimen other than nasopharyngeal swab, presence of viral mutation(s) within the areas targeted by this assay, and inadequate number of viral copies(<138 copies/mL). A negative result must be combined with clinical observations,  patient history, and epidemiological information. The expected result is Negative.  Fact Sheet for Patients:  BloggerCourse.com  Fact Sheet for Healthcare Providers:  SeriousBroker.it  This test is no t yet approved or cleared by the [place]  FDA and  has been authorized for detection and/or diagnosis of SARS-CoV-2 by FDA under an Emergency Use Authorization (EUA). This EUA will remain  in effect (meaning this test can be used) for the duration of the COVID-19 declaration under Section 564(b)(1) of the Act, 21 U.S.C.section 360bbb-3(b)(1), unless the authorization is terminated  or revoked sooner.       Influenza A by PCR NEGATIVE NEGATIVE Final   Influenza B by PCR NEGATIVE NEGATIVE Final    Comment: (NOTE) The Xpert Xpress SARS-CoV-2/FLU/RSV plus assay is intended as an aid in the diagnosis of influenza from Nasopharyngeal swab specimens and should not be used as a sole basis for treatment. Nasal washings and aspirates are unacceptable for Xpert Xpress SARS-CoV-2/FLU/RSV testing.  Fact Sheet for Patients: BloggerCourse.com  Fact Sheet for Healthcare Providers: SeriousBroker.it  This test is not yet approved or cleared by the [place]  FDA and has been authorized for detection and/or diagnosis of SARS-CoV-2 by FDA under an Emergency Use Authorization (EUA). This EUA will remain in effect (meaning this test can be used) for the duration of the COVID-19 declaration under Section 564(b)(1) of the Act, 21 U.S.C. section 360bbb-3(b)(1), unless the authorization is terminated or revoked.     Resp Syncytial Virus by PCR NEGATIVE NEGATIVE Final    Comment: (NOTE) Fact Sheet for Patients: BloggerCourse.com  Fact Sheet for Healthcare Providers: SeriousBroker.it  This test is not yet approved or cleared by the [place] FDA and has been authorized for detection and/or diagnosis of SARS-CoV-2 by FDA under an Emergency Use Authorization (EUA). This EUA will remain in effect (meaning this test can be used) for the duration of the COVID-19 declaration under Section 564(b)(1) of the Act, 21 U.S.C. section 360bbb-3(b)(1), unless the authorization is terminated or revoked.  Performed at [facility], [address] [place], [place] [address]   Blood Culture (routine x 2)     Status: None (Preliminary result)   Collection Time: 09/12/24  1:55 PM   Specimen: BLOOD LEFT FOREARM  Result Value Ref Range Status   Specimen Description BLOOD LEFT FOREARM  Final   Special Requests   Final    BOTTLES DRAWN AEROBIC AND ANAEROBIC Blood Culture adequate volume   Culture   Final    NO GROWTH 3 DAYS Performed at [facility], [address], [place], [place] [address]    Report Status PENDING  Incomplete  Blood Culture (routine x 2)     Status:  None (Preliminary result)   Collection Time: 09/12/24  3:19 PM   Specimen: BLOOD  Result Value Ref Range Status   Specimen Description   Final    BLOOD LEFT ANTECUBITAL Performed at [facility], [address] [place], [place] [address]    Special Requests   Final    BOTTLES DRAWN AEROBIC AND ANAEROBIC Blood Culture adequate volume Performed at [facility], [address] [place], [place] [address]    Culture   Final    NO GROWTH 3 DAYS Performed at [facility], [address], [place], [place] [address]    Report Status PENDING  Incomplete  Urine Culture (for pregnant, neutropenic or urologic patients or patients with an indwelling urinary catheter)     Status: None   Collection Time: 09/12/24  4:06 PM   Specimen: Urine, Clean Catch  Result Value Ref Range Status   Specimen Description   Final    URINE, CLEAN CATCH Performed at [facility], [address]  [place], [place] [address]    Special Requests   Final    NONE Performed at [facility], [address] [place], [place] [address]    Culture   Final    NO GROWTH Performed at [facility], [address], [place], [place] [address]    Report Status 09/13/2024 FINAL  Final  Respiratory (~20 pathogens) panel by PCR     Status: None   Collection Time: 09/13/24 12:42 PM   Specimen: Nasopharyngeal Swab; Respiratory  Result Value Ref Range Status   Adenovirus NOT DETECTED NOT DETECTED Final   Coronavirus 229E NOT DETECTED NOT DETECTED Final    Comment: (NOTE) The Coronavirus on the Respiratory Panel, DOES NOT test for the novel  Coronavirus (2019 nCoV)    Coronavirus HKU1 NOT DETECTED NOT DETECTED Final   Coronavirus NL63 NOT DETECTED NOT DETECTED Final   Coronavirus OC43 NOT DETECTED NOT DETECTED Final   Metapneumovirus NOT DETECTED NOT DETECTED Final   Rhinovirus / Enterovirus NOT DETECTED NOT DETECTED Final   Influenza A NOT DETECTED NOT DETECTED Final   Influenza B NOT DETECTED NOT DETECTED Final   Parainfluenza Virus 1 NOT DETECTED NOT DETECTED Final   Parainfluenza Virus 2 NOT DETECTED NOT DETECTED Final   Parainfluenza Virus 3 NOT DETECTED NOT DETECTED Final   Parainfluenza Virus 4 NOT DETECTED NOT DETECTED Final   Respiratory Syncytial Virus NOT DETECTED NOT DETECTED Final   Bordetella pertussis NOT DETECTED NOT DETECTED Final   Bordetella Parapertussis NOT DETECTED NOT DETECTED Final   Chlamydophila pneumoniae NOT DETECTED NOT DETECTED Final   Mycoplasma pneumoniae NOT DETECTED NOT DETECTED Final    Comment: Performed at [facility], [address], [place], [place] [address]  Culture, blood (Routine X 2) w Reflex to ID Panel     Status: None (Preliminary result)   Collection Time: 09/14/24 10:27 AM   Specimen: BLOOD RIGHT ARM  Result Value Ref Range Status   Specimen Description   Final    BLOOD RIGHT ARM Performed at [facility], [address] [place] [address], [place], [place] [address]    Special Requests   Final    BOTTLES DRAWN AEROBIC AND ANAEROBIC Blood Culture results may not be optimal due to an inadequate volume of blood received in culture bottles Performed at [facility], [address] [place], [place] [address]    Culture   Final  NO GROWTH < 12 HOURS Performed at [facility], [address] [place], [place] [address]    Report Status PENDING  Incomplete  Culture, blood (Routine X 2) w Reflex to ID Panel     Status: None (Preliminary result)   Collection Time: 09/14/24 10:33 AM   Specimen: BLOOD LEFT ARM  Result Value Ref Range Status   Specimen Description   Final    BLOOD LEFT ARM Performed at [facility], [address], [place], [place] [address]    Special Requests   Final    BOTTLES DRAWN AEROBIC ONLY Blood Culture results may not be optimal due to an inadequate volume of blood received in culture bottles Performed at [facility], [address], [place], [place] [address]    Culture   Final    NO GROWTH < 12 HOURS Performed at [facility], [address], [place], [place] [address]    Report Status PENDING  Incomplete  MRSA Next Gen by PCR, Nasal     Status: None   Collection Time: 09/14/24  2:37 PM   Specimen: Nasal Mucosa; Nasal Swab  Result Value Ref Range Status   MRSA by PCR Next Gen NOT DETECTED NOT DETECTED Final    Comment: (NOTE) The GeneXpert MRSA Assay (FDA approved for NASAL specimens only), is one component of a comprehensive MRSA colonization surveillance program. It is not intended to diagnose MRSA infection nor to guide or monitor treatment for MRSA infections. Test performance is not FDA approved in patients less than 79 years old. Performed at [facility], [address], [place], [place] [address]          Radiology Studies: No results found.       Scheduled Meds:  acetaminophen    650 mg Oral QID   finasteride   5 mg Oral q AM   heparin  injection (subcutaneous)  5,000 Units Subcutaneous Q8H   latanoprost   1 drop Both Eyes QHS   nystatin cream   Topical BID   pantoprazole   40 mg Oral Daily   tamsulosin   0.4 mg Oral QPC breakfast   Continuous Infusions:  ceFEPime (MAXIPIME) IV 2 g (09/15/24 1010)   vancomycin 1,750 mg (09/14/24 1155)     LOS: 3 days    Time spent: 55 minutes    Renato Applebaum, MD [facility]

## 2024-09-15 NOTE — Plan of Care (Signed)
   Problem: Education: Goal: Knowledge of General Education information will improve Description Including pain rating scale, medication(s)/side effects and non-pharmacologic comfort measures Outcome: Progressing

## 2024-09-15 NOTE — Progress Notes (Signed)
 Daily Progress Note   Patient Name: [name]       Date: 09/15/2024 DOB: 13-Sep-1946  Age: 78 y.o. MRN#: [id number] Attending Physician: Raenelle Coria, MD Primary Care Physician: Seabron Lenis, MD Admit Date: 09/12/2024  Reason for Consultation/Follow-up: Establishing goals of care  Subjective:  Resting in bed, wife at bedside. Continues to appear with generalized weakness. Minimal PO intake. Awake alert, denies pain.   Length of Stay: 3  Current Medications: Scheduled Meds:   acetaminophen   650 mg Oral QID   finasteride   5 mg Oral q AM   heparin  injection (subcutaneous)  5,000 Units Subcutaneous Q8H   latanoprost   1 drop Both Eyes QHS   nystatin cream   Topical BID   pantoprazole   40 mg Oral Daily   tamsulosin   0.4 mg Oral QPC breakfast    Continuous Infusions:  ceFEPime (MAXIPIME) IV 2 g (09/15/24 1010)   vancomycin 1,750 mg (09/14/24 1155)    PRN Meds: bisacodyl , ondansetron  **OR** ondansetron  (ZOFRAN ) IV  Physical Exam         Resting in bed Generalized weakness Awake alert Trace edema  Vital Signs: BP (!) 145/77   Pulse 100   Temp (!) 100.6 F (38.1 C) (Oral)   Resp 16   Ht 6' 3 (1.905 m)   Wt 87.7 kg   SpO2 93%   BMI 24.17 kg/m  SpO2: SpO2: 93 % O2 Device: O2 Device: Nasal Cannula O2 Flow Rate: O2 Flow Rate (L/min): 6 L/min  Intake/output summary:  Intake/Output Summary (Last 24 hours) at 09/15/2024 1052 Last data filed at 09/15/2024 0358 Gross per 24 hour  Intake 865 ml  Output 600 ml  Net 265 ml   LBM: Last BM Date : 09/13/24 Baseline Weight: Weight: 87.7 kg Most recent weight: Weight: 87.7 kg       Palliative Assessment/Data:      Patient Active Problem List   Diagnosis Date Noted   Palliative care by specialist 09/14/2024    Pneumonia 09/12/2024   Thrombocytopenia 08/28/2024   Congestion of throat 08/28/2024   Proteinuria 08/26/2024   Incontinence of urine 08/14/2024   Muscle weakness 08/14/2024   Goals of care, counseling/discussion 06/02/2024   Dehydration 05/22/2024   Rash 04/03/2024   Decreased appetite 04/03/2024   Mouth pain 02/21/2024   General weakness 02/21/2024  Constipation 01/31/2024   GERD (gastroesophageal reflux disease) 01/31/2024   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   Dilated intrahepatic bile duct 01/30/2024   Postural dizziness with presyncope 12/13/2023   Controlled type 2 diabetes mellitus without complication, without long-term current use of insulin (HCC) 12/13/2023   CKD (chronic kidney disease) 11/29/2023   At risk for side effect of medication 10/23/2023   Microcytic anemia 10/09/2023   Renal cell carcinoma, right (HCC) 10/08/2023   Clot retention of urine 04/17/2020   Hematuria 04/15/2020   Genetic testing 05/02/2018   Family history of prostate cancer    Family history of cancer    Malignant neoplasm of prostate (HCC) 04/01/2018   Hypotension 01/24/2014   Acute blood loss anemia 01/23/2014   Other and unspecified hyperlipidemia 01/23/2014   Benign prostatic hyperplasia 01/23/2014   Lower GI bleed 01/23/2014   HTN (hypertension) 08/11/2013   Diverticulosis of colon with hemorrhage 12/27/2011   ADENOMATOUS COLONIC POLYP 01/06/2008   Internal hemorrhoids 01/06/2008    Palliative Care Assessment & Plan   Patient Profile:   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  Imaging studies consistent with bilateral lower lobe pneumonia/pneumonitis.  Patient remains persistently febrile despite being on antibiotics for last 2 days.  Cultures are negative.  Assessment:    Bilateral lower lobe pneumonia/multifocal pneumonia in a patient  on chemotherapy: Persistently febrile.  Also possible metabolic fever from the cancer.  Does have evidence of parenchymal disease on both lower lobes.  Fever not coming down with conservative measures. To be transferred to stepdown unit and wide cooling blankets.  On around-the-clock Tylenol .  Various methods to decrease the temperature. Discontinue azithromycin and Rocephin, to be started on broad spectrum antibiotics vancomycin and cefepime, discussed witrh wife.  Blood cultures negative.  Repeat blood cultures to be done today.  Recommendations/Plan:  DNR DNI Time limited trial of current interventions Palliative team has discussed about hospice care at the time of initial consult, monitoring hospital course for now and PMT remains available to engage with patient/wife for ongoing goals of care discussions.   Goals of Care and Additional Recommendations: Limitations on Scope of Treatment: DNR DNI  Code Status:    Code Status Orders  (From admission, onward)           Start     Ordered   09/13/24 1051  Do not attempt resuscitation (DNR)- Limited -Do Not Intubate (DNI)  (Code Status)  Continuous       Question Answer Comment  If pulseless and not breathing No CPR or chest compressions.   In Pre-Arrest Conditions (Patient Is Breathing and Has A Pulse) Do not intubate. Provide all appropriate non-invasive medical interventions. Avoid ICU transfer unless indicated or required.   Consent: Discussion documented in EHR or advanced directives reviewed      09/13/24 1050           Code Status History     Date Active Date Inactive Code Status Order ID Comments User Context   09/12/2024 2004 09/13/2024 1050 Full Code [id number]  Arthea Child, MD ED   12/13/2023 1709 12/14/2023 1503 Full Code [id number]  Sonjia Held, MD ED   04/17/2020 0001 04/18/2020 1500 Full Code [id number]  Watt Rush, MD Inpatient   04/15/2020 1150 04/16/2020 1952 Full Code [id number]  Matilda Senior, MD  Inpatient   01/23/2014 0136 01/26/2014 1556 Full  Code [id number]  Billy Junnie HERO, MD Inpatient   08/09/2013 2036 08/12/2013 1418 Full Code [id number]  Franky Redia SAILOR, MD ED   12/28/2011 0149 12/31/2011 1502 Full Code [id number]  Megan Emmalene SAUNDERS, RN Inpatient      Advance Directive Documentation    Flowsheet Row Most Recent Value  Type of Advance Directive Living will  Pre-existing out of facility DNR order (yellow form or pink MOST form) --  MOST Form in Place? --    Prognosis:  guarded  Discharge Planning: To Be Determined  Care plan was discussed with  patient, wife. Mod MDM.   Thank you for allowing the Palliative Medicine Team to assist in the care of this patient.   Greater than 50%  of this time was spent counseling and coordinating care related to the above assessment and plan.  Lonia Serve, MD  Please contact Palliative Medicine Team phone at [phone] for questions and concerns.

## 2024-09-15 NOTE — Plan of Care (Signed)
   Problem: Activity: Goal: Risk for activity intolerance will decrease Outcome: Progressing   Problem: Nutrition: Goal: Adequate nutrition will be maintained Outcome: Progressing   Problem: Coping: Goal: Level of anxiety will decrease Outcome: Progressing

## 2024-09-15 NOTE — TOC Initial Note (Signed)
 Transition of Care [facility]) - Initial/Assessment Note    Patient Details  Name: [name] MRN: [id number] Date of Birth: 10/05/1946  Transition of Care [facility]) CM/SW Contact:    Doneta Glenys DASEN, RN Phone Number: 09/15/2024, 2:59 PM  Clinical Narrative:                 Presented for fever. Introduced and explained role to patient and [place],[name] (Spouse)[phone];DME-cane,walker,3in1;[place];denied HH,oxygen,and SDOH needs;at discharge ambulance may be required for transport. CM discussed palliative, respite and hospice care with Moreno (wife). Marie choose [facility] for services. Moreno is not ready to discuss services with [facility] right now. Moreno will contact CM when she is ready for a visit from [facility]. Inpatient care management will follow progression.    Barriers to Discharge: Continued Medical Work up   Patient Goals and CMS Choice Patient states their goals for this hospitalization and ongoing recovery are:: Home CMS Medicare.gov Compare Post Acute Care list provided to:: Patient Represenative (must comment) Meg Moreno) Choice offered to / list presented to : Spouse [place] ownership interest in [facility].provided to:: Parent NA    Expected Discharge Plan and Services In-house Referral: Hospice / Palliative Care Discharge Planning Services: CM Consult Post Acute Care Choice: NA Living arrangements for the past 2 months: Single Family Home                 DME Arranged: N/A DME Agency: NA       HH Arranged: NIV HH Agency: NA        Prior Living Arrangements/Services Living arrangements for the past 2 months: Single Family Home Lives with:: Significant Other Patient language and need for interpreter reviewed:: Yes Do you feel safe going back to the place where you live?: Yes      Need for Family Participation in Patient Care: Yes (Comment) Care giver support system in place?: Yes (comment) Current home services: DME  (cane,walker,3in1,[place]) Criminal Activity/Legal Involvement Pertinent to Current Situation/Hospitalization: No - Comment as needed  Activities of Daily Living   ADL Screening (condition at time of admission) Independently performs ADLs?: Yes (appropriate for developmental age) Is the patient deaf or have difficulty hearing?: No Does the patient have difficulty seeing, even when wearing glasses/contacts?: No Does the patient have difficulty concentrating, remembering, or making decisions?: Yes  Permission Sought/Granted Permission sought to share information with : Case Manager, Magazine features editor, Family Supports Permission granted to share information with : Yes, Verbal Permission Granted  Share Information with NAME: Vandyne,[name] (Spouse)  [phone]  Permission granted to share info w AGENCY: [facility]        Emotional Assessment Appearance:: Appears stated age Attitude/Demeanor/Rapport: Gracious Affect (typically observed): Appropriate Orientation: : Oriented to Self, Oriented to Place, Oriented to  Time, Oriented to Situation Alcohol / Substance Use: Not Applicable Psych Involvement: No (comment)  Admission diagnosis:  Pneumonia [J18.9] Fever, unspecified fever cause [R50.9] Pneumonia of both lungs due to infectious organism, unspecified part of lung [J18.9] Sepsis, due to unspecified organism, unspecified whether acute organ dysfunction present [facility]) [A41.9] Patient Active Problem List   Diagnosis Date Noted   Palliative care by specialist 09/14/2024   Pneumonia 09/12/2024   Thrombocytopenia 08/28/2024   Congestion of throat 08/28/2024   Proteinuria 08/26/2024   Incontinence of urine 08/14/2024   Muscle weakness 08/14/2024   Goals of care, counseling/discussion 06/02/2024   Dehydration 05/22/2024   Rash 04/03/2024   Decreased appetite 04/03/2024   Mouth pain  02/21/2024   General weakness 02/21/2024   Constipation 01/31/2024   GERD  (gastroesophageal reflux disease) 01/31/2024   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   Dilated intrahepatic bile duct 01/30/2024   Postural dizziness with presyncope 12/13/2023   Controlled type 2 diabetes mellitus without complication, without long-term current use of insulin (HCC) 12/13/2023   CKD (chronic kidney disease) 11/29/2023   At risk for side effect of medication 10/23/2023   Microcytic anemia 10/09/2023   Renal cell carcinoma, right (HCC) 10/08/2023   Clot retention of urine 04/17/2020   Hematuria 04/15/2020   Genetic testing 05/02/2018   Family history of prostate cancer    Family history of cancer    Malignant neoplasm of prostate (HCC) 04/01/2018   Hypotension 01/24/2014   Acute blood loss anemia 01/23/2014   Other and unspecified hyperlipidemia 01/23/2014   Benign prostatic hyperplasia 01/23/2014   Lower GI bleed 01/23/2014   HTN (hypertension) 08/11/2013   Diverticulosis of colon with hemorrhage 12/27/2011   ADENOMATOUS COLONIC POLYP 01/06/2008   Internal hemorrhoids 01/06/2008   PCP:  Seabron Lenis, MD Pharmacy:   [facility] - [facility] [address] [place] [address] Phone: [phone] Fax: [phone]  [id number] - [address] [place] - [address] [place] [address] Phone: [phone] Fax: [phone]  [id number] [place], [place] - [address] AT [facility] [address] [facility] [address] & [facility] [address] [place] [address] Phone: [phone] Fax: [phone]  [facility] [address] [place] [address] Phone: [phone] Fax: [phone]  [id number] [place],  - [address] AT [facility] [address] & [facility] [address] [place] [address] Phone: [phone] Fax: [phone]     Social Drivers of Health (SDOH) Social History: SDOH Screenings    Food Insecurity: No Food Insecurity (09/12/2024)  Housing: Low Risk  (09/12/2024)  Transportation Needs: No Transportation Needs (09/12/2024)  Utilities: Not At Risk (09/12/2024)  Depression (PHQ2-9): Low Risk  (08/14/2024)  Social Connections: Socially Integrated (09/12/2024)  Tobacco Use: High Risk (09/13/2024)   SDOH Interventions: Housing Interventions: Intervention Not Indicated Utilities Interventions: Intervention Not Indicated   Readmission Risk Interventions    09/15/2024    2:49 PM  Readmission Risk Prevention Plan  Transportation Screening Complete  PCP or Specialist Appt within 5-7 Days Complete  Home Care Screening Complete  Medication Review (RN CM) Complete

## 2024-09-15 NOTE — Progress Notes (Signed)
 MEWS Progress Note  Patient Details Name: [name] MRN: [id number] DOB: [birth year] Today's Date: 09/15/2024   MEWS Flowsheet Documentation:  Assess: MEWS Score Temp: 99.1 F (37.3 C) BP: 136/82 MAP (mmHg): 98 Pulse Rate: (!) 122 ECG Heart Rate: 86 Resp: 20 Level of Consciousness: Alert SpO2: 93 % O2 Device: Nasal Cannula Patient Activity (if Appropriate): In bed O2 Flow Rate (L/min): 6 L/min Assess: MEWS Score MEWS Temp: 0 MEWS Systolic: 0 MEWS Pulse: 2 MEWS RR: 0 MEWS LOC: 0 MEWS Score: 2 MEWS Score Color: Yellow Assess: SIRS CRITERIA SIRS Temperature : 0 SIRS Respirations : 0 SIRS Pulse: 1 SIRS WBC: 0 SIRS Score Sum : 1 Assess: if the MEWS score is Yellow or Red Were vital signs accurate and taken at a resting state?: Yes Does the patient meet 2 or more of the SIRS criteria?: Yes Does the patient have a confirmed or suspected source of infection?: Yes MEWS guidelines implemented : Yes, yellow Treat MEWS Interventions: Considered administering scheduled or prn medications/treatments as ordered Take Vital Signs Increase Vital Sign Frequency : Yellow: Q2hr x1, continue Q4hrs until patient remains green for 12hrs Escalate MEWS: Escalate: Yellow: Discuss with charge nurse and consider notifying provider and/or RRT Notify: Charge Nurse/RN Name of Charge Nurse/RN Notified: Christa RN Provider Notification Provider Name/Title: Ghimire Date Provider Notified: 09/15/24 Time Provider Notified: 1329 Method of Notification: Page Notification Reason: Change in status Provider response: See new orders Date of Provider Response: 09/15/24 Time of Provider Response: 1329 Notify: Rapid Response Name of Rapid Response RN Notified:  (na) Date Rapid Response Notified: 09/15/24 Time Rapid Response Notified: 1330      Reeda Soohoo N Lisa Milian 09/15/2024, 1:30 PM

## 2024-09-16 ENCOUNTER — Inpatient Hospital Stay (HOSPITAL_COMMUNITY)

## 2024-09-16 DIAGNOSIS — R509 Fever, unspecified: Secondary | ICD-10-CM

## 2024-09-16 DIAGNOSIS — G9389 Other specified disorders of brain: Secondary | ICD-10-CM | POA: Diagnosis not present

## 2024-09-16 DIAGNOSIS — J984 Other disorders of lung: Secondary | ICD-10-CM | POA: Diagnosis not present

## 2024-09-16 DIAGNOSIS — I611 Nontraumatic intracerebral hemorrhage in hemisphere, cortical: Secondary | ICD-10-CM

## 2024-09-16 DIAGNOSIS — C7931 Secondary malignant neoplasm of brain: Secondary | ICD-10-CM | POA: Diagnosis not present

## 2024-09-16 DIAGNOSIS — Z7189 Other specified counseling: Secondary | ICD-10-CM

## 2024-09-16 DIAGNOSIS — Z515 Encounter for palliative care: Secondary | ICD-10-CM | POA: Diagnosis not present

## 2024-09-16 DIAGNOSIS — R531 Weakness: Secondary | ICD-10-CM | POA: Diagnosis not present

## 2024-09-16 LAB — SEDIMENTATION RATE: Sed Rate: 116 mm/h — ABNORMAL HIGH (ref 0–16)

## 2024-09-16 LAB — CBC
HCT: 32 % — ABNORMAL LOW (ref 39.0–52.0)
Hemoglobin: 10.2 g/dL — ABNORMAL LOW (ref 13.0–17.0)
MCH: 26.2 pg (ref 26.0–34.0)
MCHC: 31.9 g/dL (ref 30.0–36.0)
MCV: 82.1 fL (ref 80.0–100.0)
Platelets: 263 K/uL (ref 150–400)
RBC: 3.9 MIL/uL — ABNORMAL LOW (ref 4.22–5.81)
RDW: 15.5 % (ref 11.5–15.5)
WBC: 5.2 K/uL (ref 4.0–10.5)
nRBC: 1.7 % — ABNORMAL HIGH (ref 0.0–0.2)

## 2024-09-16 LAB — BASIC METABOLIC PANEL WITH GFR
Anion gap: 15 (ref 5–15)
BUN: 21 mg/dL (ref 8–23)
CO2: 19 mmol/L — ABNORMAL LOW (ref 22–32)
Calcium: 8.8 mg/dL — ABNORMAL LOW (ref 8.9–10.3)
Chloride: 99 mmol/L (ref 98–111)
Creatinine, Ser: 1.23 mg/dL (ref 0.61–1.24)
GFR, Estimated: >60 mL/min (ref >60)
Glucose, Bld: 116 mg/dL — ABNORMAL HIGH (ref 70–99)
Potassium: 3.8 mmol/L (ref 3.5–5.1)
Sodium: 134 mmol/L — ABNORMAL LOW (ref 135–145)

## 2024-09-16 LAB — LEGIONELLA PNEUMOPHILA SEROGP 1 UR AG
L. pneumophila Serogp 1 Ur Ag: NEGATIVE
Source of Sample: 182246

## 2024-09-16 LAB — MAGNESIUM: Magnesium: 1.9 mg/dL (ref 1.7–2.4)

## 2024-09-16 LAB — C-REACTIVE PROTEIN: CRP: 28.7 mg/dL — ABNORMAL HIGH (ref <1.0)

## 2024-09-16 LAB — PROCALCITONIN: Procalcitonin: 1.31 ng/mL

## 2024-09-16 MED ORDER — SODIUM CHLORIDE 0.9 % IV SOLN: INTRAVENOUS | Status: DC

## 2024-09-16 MED ORDER — DOXYCYCLINE HYCLATE 100 MG PO TABS
100.0000 mg | ORAL_TABLET | Freq: Two times a day (BID) | ORAL | Status: DC
  Administered 2024-09-16 (×2): 100 mg via ORAL
  Filled 2024-09-16 (×3): qty 1

## 2024-09-16 MED ORDER — OXYCODONE HCL 5 MG PO TABS
2.5000 mg | ORAL_TABLET | Freq: Four times a day (QID) | ORAL | Status: DC | PRN
Start: 2024-09-16 — End: 2024-09-17
  Filled 2024-09-16: qty 1

## 2024-09-16 MED ORDER — FAMOTIDINE 20 MG PO TABS
20.0000 mg | ORAL_TABLET | Freq: Every day | ORAL | Status: DC
  Administered 2024-09-16: 20 mg via ORAL
  Filled 2024-09-16 (×2): qty 1

## 2024-09-16 MED ORDER — HYDROCORTISONE 1 % EX CREA
TOPICAL_CREAM | Freq: Three times a day (TID) | CUTANEOUS | Status: DC
  Filled 2024-09-16: qty 28

## 2024-09-16 MED ORDER — METHYLPREDNISOLONE SODIUM SUCC 125 MG IJ SOLR
80.0000 mg | Freq: Once | INTRAMUSCULAR | Status: AC
  Administered 2024-09-16: 80 mg via INTRAVENOUS
  Filled 2024-09-16: qty 2

## 2024-09-16 MED ORDER — HYDROXYZINE HCL 25 MG PO TABS
25.0000 mg | ORAL_TABLET | Freq: Three times a day (TID) | ORAL | Status: DC | PRN
  Administered 2024-09-16: 25 mg via ORAL
  Filled 2024-09-16 (×2): qty 1

## 2024-09-16 MED ORDER — DIPHENHYDRAMINE HCL 25 MG PO CAPS: 25.0000 mg | ORAL_CAPSULE | Freq: Four times a day (QID) | ORAL | Status: DC | PRN

## 2024-09-16 MED ORDER — FLUTICASONE PROPIONATE 50 MCG/ACT NA SUSP
2.0000 | Freq: Every day | NASAL | Status: DC
  Administered 2024-09-16 – 2024-09-17 (×2): 2 via NASAL
  Filled 2024-09-16: qty 16

## 2024-09-16 MED ORDER — OXYCODONE HCL 5 MG PO TABS
5.0000 mg | ORAL_TABLET | Freq: Four times a day (QID) | ORAL | Status: DC | PRN
Start: 2024-09-16 — End: 2024-09-17
  Administered 2024-09-16 – 2024-09-17 (×2): 5 mg via ORAL
  Filled 2024-09-16 (×2): qty 1

## 2024-09-16 MED ORDER — SALINE SPRAY 0.65 % NA SOLN: 1.0000 | NASAL | Status: DC | PRN

## 2024-09-16 MED ORDER — MORPHINE SULFATE (CONCENTRATE) 10 MG /0.5 ML PO SOLN: 5.0000 mg | ORAL | Status: DC | PRN

## 2024-09-16 MED ORDER — METHYLPREDNISOLONE SODIUM SUCC 125 MG IJ SOLR: 60.0000 mg | Freq: Every day | INTRAMUSCULAR | Status: DC

## 2024-09-16 MED ORDER — SODIUM CHLORIDE 0.9 % IV SOLN
1.0000 g | Freq: Three times a day (TID) | INTRAVENOUS | Status: DC
  Administered 2024-09-16 – 2024-09-17 (×3): 1 g via INTRAVENOUS
  Filled 2024-09-16 (×4): qty 20

## 2024-09-16 MED ORDER — DEXAMETHASONE SOD PHOSPHATE PF 10 MG/ML IJ SOLN
6.0000 mg | INTRAMUSCULAR | Status: DC
  Administered 2024-09-17 – 2024-09-18 (×2): 6 mg via INTRAVENOUS

## 2024-09-16 NOTE — TOC Progression Note (Signed)
 Transition of Care [facility]) - Progression Note    Patient Details  Name: [name] MRN: [id number] Date of Birth: [birth year]  Transition of Care [facility]) CM/SW Contact  Doneta Glenys DASEN, RN Phone Number: 09/16/2024, 1:28 PM  Clinical Narrative:    CM spoke with [name] (wife) and she is ready to have conversation with [facility]. Melissa Stenson updated.     Barriers to Discharge: Continued Medical Work up               Expected Discharge Plan and Services In-house Referral: Hospice / Palliative Care Discharge Planning Services: CM Consult Post Acute Care Choice: NA Living arrangements for the past 2 months: Single Family Home                 DME Arranged: N/A DME Agency: NA       HH Arranged: NIV HH Agency: NA         Social Drivers of Health (SDOH) Interventions SDOH Screenings   Food Insecurity: No Food Insecurity (09/12/2024)  Housing: Low Risk  (09/12/2024)  Transportation Needs: No Transportation Needs (09/12/2024)  Utilities: Not At Risk (09/12/2024)  Depression (PHQ2-9): Low Risk  (08/14/2024)  Social Connections: Socially Integrated (09/12/2024)  Tobacco Use: High Risk (09/13/2024)    Readmission Risk Interventions    09/15/2024    2:49 PM  Readmission Risk Prevention Plan  Transportation Screening Complete  PCP or Specialist Appt within 5-7 Days Complete  Home Care Screening Complete  Medication Review (RN CM) Complete

## 2024-09-16 NOTE — Plan of Care (Signed)

## 2024-09-16 NOTE — Consult Note (Signed)
 [facility] Hematology and oncology consult note   Patient Care Team: Seabron Lenis, MD as PCP - General (Family Medicine)   ASSESSMENT & PLAN:  78 y.o.male with past medical history of stage 4 RCC with brain metastases on third line therapy, GERD, BPH, hyperlipidemi consulted for fever.  Clinically without improvement from antibiotics. Extensive work up so far are negative for infectious process. Patient continues to have chills and on oxygen. Bilateral infiltrates, suspect pneumonitis. Discussed with wife at bedside trial of steroid given the clinical scenario.  We had previously discussed GOC in the outpatient setting and palliative care consult. They weren't ready. Discussed today and she is understanding of the nature of his disease and he has a time limit like everyone else. If he does not improve over the next few days, comfort measures is reasonable.  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 On hold Discussed GOC and palliative care consult. Appreciate palliative care support  Other medical condition management per IM team. Appreciate hospitalist's excellent care and work up in help with the case.  Code Status DNR  Goals of care Discussed if no improvement over the next 24-48 hours, comfort measures are reasonable.   All questions were answered. Will follow up again tomorrow.  I personally spent a total of 55 minutes in the care of the patient today including preparing to see the patient, getting/reviewing separately obtained history, performing a medically appropriate exam/evaluation, counseling and educating, placing orders, referring and communicating with other health care professionals, documenting clinical information in the EHR, and communicating  results.  [name], MD 09/16/2024 7:42 AM   CHIEF COMPLAINTS/PURPOSE OF ADMISSION Fever with renal cell carcinoma  HISTORY OF PRESENTING ILLNESS:  [name] 78 y.o. male consulted for mRCC with fever. Patient is admitted for fever. Report since visit last week she developed fever and presented to ED on 10/17. Report of coughs, decreased appetite. Wife at bedside reports may have head pain but he did not complain of persistent headaches. He is lethargic appearing and having wax and waning status. On admission fever was found. CT chest should bilateral patchy infiltrates. CT AP did not show acute process. Infectious work up so far are negative for flu, COVID, RSV and viral panel, urine cultures and blood cultures. Patient continues to have chills and requires oxygen. He denies much headaches, vision change.  Summary of oncologic history as follows: Oncology History  Malignant neoplasm of prostate (HCC)  04/01/2018 Initial Diagnosis   Malignant neoplasm of prostate (HCC)   04/30/2018 Genetic Testing   Multi-Cancer panel (83 genes) @ [facility] - No pathogenic mutations detected Variants of Uncertain Significance in ATM, SDHA and TSC2  Genes Analyzed: 83 genes on [facility]'s Multi-Cancer panel (ALK, APC, ATM, AXIN2, BAP1, BARD1, BLM, BMPR1A, BRCA1, BRCA2, BRIP1, CASR, CDC73, CDH1, CDK4, CDKN1B, CDKN1C, CDKN2A, CEBPA, CHEK2, CTNNA1, DICER1, DIS3L2, EGFR, EPCAM, FH, FLCN, GATA2, GPC3, GREM1, HOXB13, HRAS, KIT, MAX, MEN1, MET, MITF, MLH1, MSH2, MSH3, MSH6, MUTYH, NBN, NF1, NF2, NTHL1, PALB2, PDGFRA, PHOX2B, PMS2, POLD1, POLE, POT1, PRKAR1A, PTCH1, PTEN, RAD50, RAD51C, RAD51D, RB1, RECQL4, RET, RUNX1, SDHA, SDHAF2, SDHB, SDHC, SDHD, SMAD4, SMARCA4, SMARCB1, SMARCE1, STK11, SUFU, TERC, TERT, TMEM127, TP53, TSC1, TSC2, VHL, WRN, WT1).  UPDATE: ATM [id number] VUS was amended to Likely Benign due to a re-review of the  evidence in light of new variant interpretation guidelines and/or new information.    Renal cell carcinoma, right (HCC)  09/29/2022 Imaging   CT chest 1.  No evidence of metastatic disease in the thorax.  2.  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 Consider correlation with genetic testing.  3.  Reference same day abdominal MRI for details regarding the right renal mass and associated tumor thrombus in the right renal vein and IVC    09/29/2022 Imaging   MRI 1.  Right interpolar renal 3.5 cm mass concerning for primary renal malignancy. Upper pole and lower pole tumor is favored to primarily be intravenous tumor thrombus, with intravenous tumor expanding the right renal vein, and the subdiaphragmatic IVC as detailed above. Differential includes renal cell carcinoma (favored) and transitional cell carcinoma.  2.  Please see outside 08/25/2022 CT abdomen and pelvis for evaluation of the collecting system on delayed/urographic phase.  3.  No definite lymphadenopathy.  4.  Signal changes in the L5 superior endplate are favored to reflect a fracture. However, recommend continued attention on follow-up.    10/27/2022 Pathology Results   SPECIMEN    Procedure:    Radical nephrectomy     Specimen Laterality:    Right   TUMOR    Tumor Focality:    Unifocal     Tumor Size:    Greatest Dimension (Centimeters): 8.2 cm     Histologic Type:    Papillary renal cell carcinoma, type 2     Histologic Grade (WHO / ISUP):    G3 (nucleoli conspicuous and eosinophilic at 100x magnification)     Tumor Extent:    Extends into perinephric tissue (beyond renal capsule)     Tumor Extent:    Extends into renal sinus     Tumor Extent:    Extends into major vein (renal vein or its segmental branches, inferior vena cava)     Sarcomatoid Features:    Not identified     Rhabdoid Features:    Not identified     Tumor Necrosis:    Not identified     Lymphovascular Invasion:    Present   MARGINS    Margin Status:    Invasive  carcinoma present at margin       Margin(s) Involved by Invasive Carcinoma:    Renal vein: present in renal vein nephrectomy margin; No tumor seen is separately submitted renal vein segment.   REGIONAL LYMPH NODES     Regional Lymph Node Status:    Not applicable (no regional lymph nodes submitted or found)   PATHOLOGIC STAGE CLASSIFICATION (pTNM, AJCC 8th Edition)     Reporting of pT, pN, and (when applicable) pM categories is based on information available to the pathologist at the time the report is issued. As per the AJCC (Chapter 1, 8th Ed.) it is the managing physician's responsibility to establish the final  pathologic stage based upon all pertinent information, including but potentially not limited to this pathology report.     Primary Tumor (pT):    pT3b     Regional Lymph Nodes (pN):    pN not assigned (no nodes submitted or found)    03/08/2023 Imaging   CT CAP Status post radical right nephrectomy, IVC repair, and partial right adrenalectomy without evidence of recurrent or metastatic disease.  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  L5 compression deformity  without progressive interval height loss  Lungs/Pleura: No large airspace consolidation or pleural effusion. Similar distribution of numerous thin-walled pulmonary cysts throughout the bilateral lungs. Bibasilar atelectasis/scarring. No new pulmonary nodules.    09/13/2023 Imaging   CT CAP 5 mm left lower lobe pulmonary nodule Solid upper lobe pulmonary nodule 3 mm 4 mm right lower lobe nodule Sclerotic focus T1 change from prior likely bone island.  No aggressive lytic lesion.  Multilevel degenerative changes of spine and bilateral shoulder Similar appearance of sclerotic superior endplate deformity at L5 unchanged dating back to September 2023.  New from April 2019. 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 No pathologically enlarged abdominal or pelvic lymph nodes.   10/08/2023 Initial Diagnosis   Renal cell carcinoma, right (HCC)   11/08/2023 - 04/03/2024 Chemotherapy   Patient is on Treatment Plan : BLADDER Pembrolizumab  (200) q21d     04/09/2024 Imaging   MRI brain: At least 30 enhancing metastases are identified within the supratentorial and infratentorial brain.   04/17/2024 Imaging   CT CAP 1. Status post right nephrectomy and adrenalectomy. 2. Unchanged appearance of soft tissue nodule in the nephrectomy bed overlying the most superior portion of the right psoas, measuring 1.7 x 0.9 cm which remains highly suspicious for locally recurrent malignancy. 3. Multiple small bilateral pulmonary nodules, not significantly changed, measuring up to 0.6 cm, nonspecific. Continued attention on follow-up. 4. No other evidence of lymphadenopathy or metastatic disease in the chest, abdomen, or pelvis. 5. Similar intra and extrahepatic biliary ductal dilatation, common bile duct dilatation measuring up to 1.1 cm in caliber with associated periportal edema. No calculus or other obstruction visible to the ampulla. 6. Unchanged cystic lesion of the dorsal pancreatic body measuring 1.3 x 1.3 cm. No pancreatic ductal dilatation or surrounding inflammatory changes. 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)   06/19/2024 -  Chemotherapy   Patient is on  Treatment Plan : BRAIN Bevacizumab  + Everolimus  q28d       MEDICAL HISTORY:  Past Medical History:  Diagnosis Date   Allergy    Anemia    Arthritis    Diabetes mellitus without complication (HCC) 12/2021   ED (erectile dysfunction)    Family history of cancer    Family history of prostate cancer    Genetic testing 05/02/2018   Multi-Cancer panel (83 genes) @ [facility] - No pathogenic mutations detected   History of anemia    due to acute blood loss from lower GI bleeds 2007, 2010, 2013   History of GI diverticular bleed    tranfused 03/ 2010, 01/ 2013/  no blood needed 09/ 2014, 2015   History of lower GI bleeding 08/2006   post colonoscopy w/ polypectomy -- treatment colonoscopy w/ endo clip at polypectomy site and 2PRBCs   History of pneumothorax 07/2008   spontaneous-- tx chest tube   HLD (hyperlipidemia)    HTN (hypertension)    Hx  of adenomatous colonic polyps    Hyperplasia of prostate with lower urinary tract symptoms (LUTS)    Mild obstructive sleep apnea    06-21-2018 per pt study done 8 yrs ago, told borderline osa , no cpap   Primary hypogonadism in male    Prostate cancer [facility]) urologist-  dr winter/  oncologist-  dr patrcia   first dx 04/ 2015  Stage T1c, PSA 4.94;  last prostate bx 02-25-2018  Stage T1c, Gleason 3+4, PSA 9.45, vol 120cc-- planned treatement external beam radiation   Wears glasses     SURGICAL HISTORY: Past Surgical History:  Procedure Laterality Date   COLONOSCOPY  02/08/2022   2019   COLONOSCOPY W/ POLYPECTOMY  last one 2014   GOLD SEED IMPLANT N/A 06/26/2018   Procedure: GOLD SEED IMPLANT;  Surgeon: Devere Lonni Righter, MD;  Location: [facility];  Service: Urology;  Laterality: N/A;  ONLY NEEDS 30 MIN FOR ALL PROCEDURES   PROSTATE BIOPSY  last one 02-25-2018   dr winter office   SPACE OAR INSTILLATION N/A 06/26/2018   Procedure: SPACE OAR INSTILLATION;  Surgeon: Devere Lonni Righter, MD;  Location: [facility];  Service: Urology;  Laterality: N/A;   TONSILLECTOMY  child   UMBILICAL HERNIA REPAIR  2012    SOCIAL HISTORY: Social History   Socioeconomic History   Marital status: Married    Spouse name: Not on file   Number of children: 2   Years of education: Not on file   Highest education level: Not on file  Occupational History   Occupation: HIGHWAY INSPECTOR    Employer: [employer]  Tobacco Use   Smoking status: Never   Smokeless tobacco: Current    Types: Chew   Tobacco comments:    06-21-2018 chew tobacco since age 31 (since 59s) 27oz/week  (60 yrs)  Vaping Use   Vaping status: Never Used  Substance and Sexual Activity   Alcohol use: Not Currently    Comment: weekend--- case of beer   Drug use: No   Sexual activity: Not on file  Other Topics Concern   Not on file  Social History Narrative   The patient is married and lives in [place].   Social Drivers of Corporate investment banker Strain: Not on file  Food Insecurity: No Food Insecurity (09/12/2024)   Hunger Vital Sign    Worried About Running Out of Food in the Last Year: Never true    Ran Out of Food in the Last Year: Never true  Transportation Needs: No Transportation Needs (09/12/2024)   PRAPARE - Administrator, Civil Service (Medical): No    Lack of Transportation (Non-Medical): No  Physical Activity: Not on file  Stress: Not on file  Social Connections: Socially Integrated (09/12/2024)   Social Connection and Isolation Panel    Frequency of Communication with Friends and Family: More than three times a week    Frequency of Social Gatherings with Friends and Family: More than three times a week    Attends Religious Services: More than 4 times per year    Active Member of [employer] or Organizations: No    Attends Engineer, structural: More than 4 times per year    Marital Status: Married  Catering manager Violence: Not At Risk (09/12/2024)   Humiliation,  Afraid, Rape, and Kick questionnaire    Fear of Current or Ex-Partner: No    Emotionally Abused: No    Physically Abused:  No    Sexually Abused: No    FAMILY HISTORY: Family History  Problem Relation Age of Onset   Heart disease Mother    Diabetes Mother    Hypertension Mother    Cancer Father 34       metastatic; unk. primary; deceased 47   Cancer Maternal Uncle        unk. primary   Cancer Paternal Aunt        unk. primary; died young   Colon cancer Paternal Uncle 66       deceased 42   Prostate cancer Cousin 43       maternal first cousin; son of uncle with metastatic cancer   Prostate cancer Cousin        paternal first cousin; son of unaffected paternal aunt   Esophageal cancer Neg Hx    Rectal cancer Neg Hx    Stomach cancer Neg Hx    Colon polyps Neg Hx     ALLERGIES:  is allergic to bee venom, tadalafil, aspirin, penicillins, and triamterene-hctz.  MEDICATIONS:  Current Facility-Administered Medications  Medication Dose Route Frequency Provider Last Rate Last Admin   acetaminophen  (TYLENOL ) tablet 650 mg  650 mg Oral QID Patel, Pranav M, MD   650 mg at 09/16/24 9447   bisacodyl  (DULCOLAX) EC tablet 5 mg  5 mg Oral Daily PRN Claiborne, Claudia, MD       ceFEPIme (MAXIPIME) 2 g in sodium chloride  0.9 % 100 mL IVPB  2 g Intravenous Q12H Keygan, Melissa, RPH 200 mL/hr at 09/15/24 2140 2 g at 09/15/24 2140   finasteride  (PROSCAR ) tablet 5 mg  5 mg Oral q AM Patel, Pranav M, MD   5 mg at 09/15/24 0954   heparin  injection 5,000 Units  5,000 Units Subcutaneous Q8H Patel, Pranav M, MD   5,000 Units at 09/16/24 0554   latanoprost  (XALATAN ) 0.005 % ophthalmic solution 1 drop  1 drop Both Eyes QHS Tobie Yetta HERO, MD   1 drop at 09/15/24 2151   LORazepam (ATIVAN) 2 MG/ML concentrated solution 0.5 mg  0.5 mg Oral Q4H PRN Ghimire, Kuber, MD       methylPREDNISolone  sodium succinate (SOLU-MEDROL ) 125 mg/2 mL injection 80 mg  80 mg Intravenous Once Tina Pauletta BROCKS, MD        morphine  CONCENTRATE 10 mg / 0.5 ml oral solution 10 mg  10 mg Oral Q2H PRN Ghimire, Kuber, MD       nystatin cream (MYCOSTATIN)   Topical BID Patel, Pranav M, MD   Given at 09/15/24 2151   ondansetron  (ZOFRAN ) tablet 4 mg  4 mg Oral Q6H PRN Arthea Child, MD       Or   ondansetron  (ZOFRAN ) injection 4 mg  4 mg Intravenous Q6H PRN Arthea Child, MD       pantoprazole  (PROTONIX ) EC tablet 40 mg  40 mg Oral Daily Patel, Pranav M, MD   40 mg at 09/15/24 9044   tamsulosin  (FLOMAX ) capsule 0.4 mg  0.4 mg Oral QPC breakfast Patel, Pranav M, MD   0.4 mg at 09/15/24 9043   vancomycin (VANCOREADY) IVPB 1750 mg/350 mL  1,750 mg Intravenous Q24H Yoshiharu, Brassell, RPH 175 mL/hr at 09/15/24 1328 1,750 mg at 09/15/24 1328    REVIEW OF SYSTEMS:   Constitutional: Denies fevers, weight loss or abnormal night sweats Eyes: Denies visual change Ears, nose, mouth, throat, and face: Denies sore throat or enlarged tongue Respiratory: Denies cough, shortness of breath or wheezes Cardiovascular: Denies  palpitation, chest discomfort or chest pain Gastrointestinal:  Denies nausea, vomiting, diarrhea, constipation, heartburn or abdominal pain GU: Denies any hesitancy, dysuria, frequency, hematuria Skin: Denies abnormal skin rashes Lymphatics: Denies new lymphadenopathy or mass Neurological: Denies numbness, tingling or new weaknesses All other systems were reviewed with the patient and are negative.  PHYSICAL EXAMINATION: ECOG PERFORMANCE STATUS: 4 - Bedbound  Vitals:   09/16/24 0007 09/16/24 0507  BP:  (!) 155/95  Pulse: 99 (!) 110  Resp: 19 20  Temp: 98.5 F (36.9 C) 97.7 F (36.5 C)  SpO2: 97%    Filed Weights   09/12/24 2129  Weight: 193 lb 6.2 oz (87.7 kg)    GENERAL: lethargic, on oxygen EYES: sclera clear NECK: supple. No mass LUNGS: bilateral rales; on oxygen HEART: regular rate & rhythm ABDOMEN: abdomen soft, non-tender  NEURO: alert but appears wax and waning when asked  question.  LABORATORY DATA:  I have reviewed the data as listed Lab Results  Component Value Date   WBC 6.8 09/14/2024   HGB 11.8 (L) 09/14/2024   HCT 37.4 (L) 09/14/2024   MCV 81.0 09/14/2024   PLT 199 09/14/2024   Recent Labs    09/11/24 0841 09/12/24 1502 09/13/24 0056 09/14/24 0648  NA 131* 132* 133* 132*  K 3.6 3.6 3.7 3.7  CL 93* 95* 96* 94*  CO2 30 22 24 22   GLUCOSE 213* 153* 135* 109*  BUN 30* 25* 22 15  CREATININE 1.93* 1.51* 1.48* 1.27*  CALCIUM  9.6 9.2 9.1 8.9  GFRNONAA 35* 48* 49* 59*  PROT 7.1 6.7  --  6.1*  ALBUMIN 3.5 3.3*  --  3.0*  AST 69* 74*  --  89*  ALT 59* 54*  --  44  ALKPHOS 102 106  --  108  BILITOT 0.6 0.5  --  0.5    RADIOGRAPHIC STUDIES: I have personally reviewed the radiological images as listed and agreed with the findings in the report. CT Angio Chest PE W and/or Wo Contrast Result Date: 09/12/2024 EXAM: CTA CHEST PE  WITH CONTRAST CT ABDOMEN AND PELVIS  WITH CONTRAST 09/12/2024 06:06:11 PM TECHNIQUE: CTA of the chest was performed after the administration of intravenous contrast. Multiplanar reformatted images are provided for review. MIP images are provided for review. CT of the abdomen and pelvis was performed with the administration of intravenous contrast. Automated exposure control, iterative reconstruction, and/or weight based adjustment of the mA/kV was utilized to reduce the radiation dose to as low as reasonably achievable. COMPARISON: CT 06/11/2024. CLINICAL HISTORY: Pulmonary embolism (PE) suspected, high probability. Confusion, fever (103F), and decreased responsiveness. History of renal cell carcinoma with brain metastasis, on immunologic/chemotherapy meds (currently on hold). Denies seizures, headache, neck pain, nausea, vomiting, or diarrhea. Poor oral intake. Palliative care evaluation pending. FINDINGS: CHEST: PULMONARY ARTERIES: Pulmonary arteries are adequately opacified for evaluation. No intraluminal filling defect to  suggest pulmonary embolism. Main pulmonary artery is normal in caliber. MEDIASTINUM: No mediastinal lymphadenopathy. The heart and pericardium demonstrate no acute abnormality. There is no acute abnormality of the thoracic aorta. LUNGS AND PLEURA: Patchy bilateral ground-glass opacities in a peribronchovascular distribution with more confluent airspace opacities in the left greater than right lower lobes. Numerous pulmonary cysts. No pleural effusion or pneumothorax. Unchanged 6 mm nodule on the left lower lobe on series 9 image 243. SOFT TISSUES AND BONES: No acute bone or soft tissue abnormality. ABDOMEN AND PELVIS: LIVER: Hepatic steatosis. GALLBLADDER AND BILE DUCTS: Gallbladder is unremarkable. No biliary ductal dilatation. SPLEEN:  Spleen demonstrates no acute abnormality. PANCREAS: Cystic lesion in the tail of the pancreas is unchanged again measuring 1.3 cm. No pancreatic ductal dilation or evidence of acute pancreatitis. ADRENAL GLANDS: Right adrenalectomy. Left adrenal cysts. KIDNEYS, URETERS AND BLADDER: Right nephrectomy. No stones in the left kidney or ureters. No hydronephrosis. No perinephric or periureteral stranding. Urinary bladder is unremarkable. GI AND BOWEL: Stomach and duodenal sweep demonstrate no acute abnormality. There is no bowel obstruction. No abnormal bowel wall thickening or distension. Normal appendix. REPRODUCTIVE: Metallic seeds in the prostate. Enlarged prostate. PERITONEUM AND RETROPERITONEUM: No ascites or free air. LYMPH NODES: No lymphadenopathy. BONES AND SOFT TISSUES: No acute abnormality of the visualized bones. Similar mildly enhancing soft tissue nodule in the right nephrectomy bed overlying the most superior portion of the right psoas measuring 2.0 x 0.7 cm. IMPRESSION: 1. No evidence of pulmonary embolism. 2. Patchy bilateral ground-glass and confluent lower lobe airspace opacities. Differential considerations include multifocal pneumonia, edema, alveolar hemorrhage, or  acute lung injury. 3. No acute abdominal or pelvic abnormality. Electronically signed by: Norman Gatlin MD 09/12/2024 06:55 PM EDT RP Workstation: [id number]   CT ABDOMEN PELVIS W CONTRAST Result Date: 09/12/2024 EXAM: CTA CHEST PE  WITH CONTRAST CT ABDOMEN AND PELVIS  WITH CONTRAST 09/12/2024 06:06:11 PM TECHNIQUE: CTA of the chest was performed after the administration of intravenous contrast. Multiplanar reformatted images are provided for review. MIP images are provided for review. CT of the abdomen and pelvis was performed with the administration of intravenous contrast. Automated exposure control, iterative reconstruction, and/or weight based adjustment of the mA/kV was utilized to reduce the radiation dose to as low as reasonably achievable. COMPARISON: CT 06/11/2024. CLINICAL HISTORY: Pulmonary embolism (PE) suspected, high probability. Confusion, fever (103F), and decreased responsiveness. History of renal cell carcinoma with brain metastasis, on immunologic/chemotherapy meds (currently on hold). Denies seizures, headache, neck pain, nausea, vomiting, or diarrhea. Poor oral intake. Palliative care evaluation pending. FINDINGS: CHEST: PULMONARY ARTERIES: Pulmonary arteries are adequately opacified for evaluation. No intraluminal filling defect to suggest pulmonary embolism. Main pulmonary artery is normal in caliber. MEDIASTINUM: No mediastinal lymphadenopathy. The heart and pericardium demonstrate no acute abnormality. There is no acute abnormality of the thoracic aorta. LUNGS AND PLEURA: Patchy bilateral ground-glass opacities in a peribronchovascular distribution with more confluent airspace opacities in the left greater than right lower lobes. Numerous pulmonary cysts. No pleural effusion or pneumothorax. Unchanged 6 mm nodule on the left lower lobe on series 9 image 243. SOFT TISSUES AND BONES: No acute bone or soft tissue abnormality. ABDOMEN AND PELVIS: LIVER: Hepatic steatosis. GALLBLADDER AND  BILE DUCTS: Gallbladder is unremarkable. No biliary ductal dilatation. SPLEEN: Spleen demonstrates no acute abnormality. PANCREAS: Cystic lesion in the tail of the pancreas is unchanged again measuring 1.3 cm. No pancreatic ductal dilation or evidence of acute pancreatitis. ADRENAL GLANDS: Right adrenalectomy. Left adrenal cysts. KIDNEYS, URETERS AND BLADDER: Right nephrectomy. No stones in the left kidney or ureters. No hydronephrosis. No perinephric or periureteral stranding. Urinary bladder is unremarkable. GI AND BOWEL: Stomach and duodenal sweep demonstrate no acute abnormality. There is no bowel obstruction. No abnormal bowel wall thickening or distension. Normal appendix. REPRODUCTIVE: Metallic seeds in the prostate. Enlarged prostate. PERITONEUM AND RETROPERITONEUM: No ascites or free air. LYMPH NODES: No lymphadenopathy. BONES AND SOFT TISSUES: No acute abnormality of the visualized bones. Similar mildly enhancing soft tissue nodule in the right nephrectomy bed overlying the most superior portion of the right psoas measuring 2.0 x 0.7 cm. IMPRESSION: 1. No evidence  of pulmonary embolism. 2. Patchy bilateral ground-glass and confluent lower lobe airspace opacities. Differential considerations include multifocal pneumonia, edema, alveolar hemorrhage, or acute lung injury. 3. No acute abdominal or pelvic abnormality. Electronically signed by: Norman Gatlin MD 09/12/2024 06:55 PM EDT RP Workstation: [id number]   CT Head Wo Contrast Result Date: 09/12/2024 CLINICAL DATA:  Worsening headache. Fever. Undergoing chemotherapy for renal cell carcinoma. EXAM: CT HEAD WITHOUT CONTRAST TECHNIQUE: Contiguous axial images were obtained from the base of the skull through the vertex without intravenous contrast. RADIATION DOSE REDUCTION: This exam was performed according to the departmental dose-optimization program which includes automated exposure control, adjustment of the mA and/or kV according to patient size  and/or use of iterative reconstruction technique. COMPARISON:  09/01/2024 FINDINGS: Brain: No evidence of intracranial hemorrhage, acute infarction, hydrocephalus, extra-axial collection, or mass lesion/mass effect. Stable mild cerebral atrophy and moderate chronic small vessel disease. 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 Several small cystic lesions in the medial left frontal and right parietal lobes remains stable. Vascular:  No hyperdense vessel or other acute findings. Skull: No evidence of fracture or other significant bone abnormality. Sinuses/Orbits:  No acute findings. Other: None. IMPRESSION: No acute intracranial abnormality. Stable cerebral atrophy and chronic small vessel disease. a Stable small cystic lesions in the medial left frontal and right parietal lobes. Electronically Signed   By: Norleen DELENA Kil M.D.   On: 09/12/2024 18:27   DG Chest Portable 1 View Result Date: 09/12/2024 CLINICAL DATA:  fever EXAM: PORTABLE CHEST - 1 VIEW COMPARISON:  09/01/2024 FINDINGS: Mild elevation of the right hemidiaphragm due to low lung volumes. No focal airspace consolidation, pleural effusion, or pneumothorax. The cardiac silhouette is at the upper limits of normal, likely accentuated by AP technique and low lung volumes. Sternotomy wires. Tortuous aorta with aortic atherosclerosis. No acute fracture or destructive lesions. Multilevel thoracic osteophytosis. IMPRESSION: Low lung volumes.  Otherwise, no acute cardiopulmonary abnormality. Electronically Signed   By: Rogelia Myers M.D.   On: 09/12/2024 15:04   DG Chest Portable 1 View Result Date: 09/01/2024 CLINICAL DATA:  Fatigue EXAM: PORTABLE CHEST 1 VIEW COMPARISON:  December 13, 2023 FINDINGS: Previous median sternotomy. The heart size is normal. There is no pleural effusion. The lungs are clear. No fracture or bone lesion identified. IMPRESSION: No acute disease Electronically Signed   By: Nancyann Burns M.D.   On: 09/01/2024 09:57   CT  Head Wo Contrast Result Date: 09/01/2024 CLINICAL DATA:  Headache and fatigue EXAM: CT HEAD WITHOUT CONTRAST TECHNIQUE: Contiguous axial images were obtained from the base of the skull through the vertex without intravenous contrast. RADIATION DOSE REDUCTION: This exam was performed according to the departmental dose-optimization program which includes automated exposure control, adjustment of the mA and/or kV according to patient size and/or use of iterative reconstruction technique. COMPARISON:  Brain MRI August 07, 2004 FINDINGS: CT HEAD: Small cystic lesions are noted in the medial left frontal lobe and right parietal lobe similar to the previous brain MRI. There is no hemorrhage. No acute ischemic changes. No new mass lesion identified. The ventricles are normal. Skull/sinuses/orbits: No significant abnormality. IMPRESSION: Small cystic lesions are noted in the medial left frontal lobe and right parietal lobe similar to the brain MRI from August 07, 2024. There is no hemorrhage, acute infarct or new mass identified. Electronically Signed   By: Nancyann Burns M.D.   On: 09/01/2024 09:55

## 2024-09-16 NOTE — Assessment & Plan Note (Signed)
 Fever, with underlying brain metastases Trial of steroid as above

## 2024-09-16 NOTE — Assessment & Plan Note (Addendum)
 Solumedrol 80 mg iv today, 60 mg iv tomorrow and reassess

## 2024-09-16 NOTE — Progress Notes (Signed)
 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 Imaging studies consistent with bilateral lower lobe pneumonia/pneumonitis. Patient remains persistently febrile despite being on antibiotics for last 2 days. Cultures are negative   Assessment and Plan: Community-acquired pneumonia versus pneumonitis. Treated with IV antibiotic. No improvement. Ongoing cough and shortness of breath. Was on 6 LPM at the morning of 10/21. 88 to 89% on room air. Currently on 2 LPM. Continue with steroids. Switching Solu-Medrol  to Decadron  as the wife is concerned about rash. Switching vancomycin to doxycycline. Switching cefepime to meropenem given ongoing encephalopathy.  Rash. Vistaril as needed.   Pain control. Wife does not want to use narcotic regimen although explained that patient does not have multiple options for pain control given his renal dysfunction as well as already being on scheduled Tylenol  and steroids.   RCC with brain metastasis. Had some dizziness. CT head unremarkable. No focal deficits. Outpatient follow-up with oncology. Plan for MRI in November.   Poor p.o. intake. Anorexia. Was on Decadron . Was given Solu-Medrol  for pneumonitis.  Will switch to Decadron .   Balanitis. Reportedly patient has been treated for with nystatin outpatient for possible balanitis. Will continue the same for now.   HTN. Patient is on amlodipine  at home. As well as lisinopril  HCTZ. Medication currently on hold. Receiving IV fluid.   BPH. On Flomax  and finasteride . Continue.   GERD. Continue PPI.   HLD. On statin. Currently on hold.   Mild elevation of LFT. Monitor for now.   Goals of  care. Palliative care following.  Currently DNR/DNI.  Hospice consulted.  Wife already. Monitor.   CKD stage IIIb. Baseline renal creatinine around 1.4 lately. Currently stable. Monitor.  Subjective: Patient present.  No nausea no vomiting no fever no chills.  Ongoing shortness of breath.  Minimal oral intake.  Physical Exam: Basal crackles. S1-S2 present Bowel sound present Nontender. Trace edema bilaterally. Right-sided weakness seen. Left-sided tremor seen. No asterixis.  Data Reviewed: I have Reviewed nursing notes, Vitals, and Lab results. Since last encounter, pertinent lab results CBC and BMP   . I have ordered test including CBC and BMP  . I have discussed pt's care plan and test results with palliative care and oncology  . I have ordered imaging MRI brain  .   Disposition: Status is: Inpatient Remains inpatient appropriate because: Monitor for improvement in mentation and oral intake versus clarity on goals of care.  heparin  injection 5,000 Units Start: 09/13/24 1400   Family Communication: Wife at bedside Level of care: Telemetry   Vitals:   09/16/24 0507 09/16/24 1225 09/16/24 1301 09/16/24 1543  BP: (!) 155/95  116/71 127/72  Pulse: (!) 110  93 83  Resp: 20  20 17   Temp: 97.7 F (36.5 C) 99.3 F (37.4 C) 97.9 F (36.6 C) 98.3 F (36.8 C)  TempSrc:  Rectal Oral   SpO2:   94% 96%  Weight:      Height:         Author: Yetta Blanch, MD 09/16/2024 6:39 PM  Please look on [email or web address] to find out who is on call.

## 2024-09-16 NOTE — Plan of Care (Signed)
   Problem: Education: Goal: Knowledge of General Education information will improve Description: Including pain rating scale, medication(s)/side effects and non-pharmacologic comfort measures Outcome: Progressing   Problem: Clinical Measurements: Goal: Respiratory complications will improve Outcome: Progressing

## 2024-09-16 NOTE — Assessment & Plan Note (Addendum)
 On 3rd line of therapy. On hold Discussed GOC and palliative care consult. Appreciate palliative care support

## 2024-09-16 NOTE — Evaluation (Signed)
 SLP Cancellation Note  Patient Details Name: [name] MRN: [id number] DOB: Oct 22, 1946   Cancelled treatment:       Reason Eval/Treat Not Completed: Other (comment) (pt off the floor at this time, having a brain MRI)  Madelin POUR, MS [facility] SLP Acute Rehab Services Office [phone]  Nicolas Emmie Caldron 09/16/2024, 4:27 PM

## 2024-09-16 NOTE — Progress Notes (Signed)
  Daily Progress Note   Patient Name: [name]       Date: 09/16/2024 DOB: 28-Aug-1946  Age: 78 y.o. MRN#: [id number] Attending Physician: Tobie Yetta HERO, MD Primary Care Physician: Seabron Lenis, MD Admit Date: 09/12/2024 Length of Stay: 4 days  Reason for Consultation/Follow-up: Establishing goals of care  Subjective:   Reviewed EMR including recent documentation from hospitalist, oncologist, and TOC.  Oncologist's documentation noted concern for pneumonitis so trialing steroid to assist with management.  Oncologist discussed with wife that if patient is not improving over the next few days, comfort focused measures would be appropriate.  TOC documentation noted wife was ready to have conversations with [facility] liaison.  When presenting to room to see patient, multiple family members at bedside.  Allowed time for visiting. Discussed care with bedside RN for medical updates.  Patient being transferred to another floor for telemetry monitoring. Discussed care with hospitalist, TOC, [facility] liaison, bedside RN, and oncologist to coordinate care.  Continuing appropriate medical interventions at this time.  Patient to undergo MRI for evaluation today.  Discussion noted that if patient does not improve in next 24-48 hours, comfort focused measures would be appropriate.  This would likely determine patient going home with hospice support or not.  Palliative medicine team continuing to follow along to engage in conversations as able and appropriate.  Objective:   Vital Signs:  BP (!) 155/95 (BP Location: Right Arm)   Pulse (!) 110   Temp 97.7 F (36.5 C)   Resp 20   Ht 6' 3 (1.905 m)   Wt 87.7 kg   SpO2 97%   BMI 24.17 kg/m   Physical Exam: General: NAD, chronically ill-appearing, frail Cardiovascular: RRR Respiratory: no increased work of breathing noted, not in respiratory distress  Assessment & Plan:   Assessment: Patient is a 78 year old male with a past medical history of  metastatic renal cell carcinoma with disease to brain, GERD, BPH, and hyperlipidemia who was admitted on 09/12/2024 for management of fever, chills, and sore throat at home.  During hospitalization patient has received management for pneumonia though remains persistently febrile despite antibiotics; concern for underlying pneumonitis.  Oncology consulted for recommendations.  Palliative medicine team consulted to assist with complex medical decision making.  Recommendations/Plan: # Complex medical decision making/goals of care:  - Continuing appropriate medical interventions at this time.  Patient receiving further workup as per hospitalist discussion with family.  Discussed care with team and noted continuing time-limited trial to determine if patient will improve with current medical interventions.  If patient not improving in 24-48 hours, was recommended considering transition to comfort focused care.  Palliative medicine team continue to engage in conversations as able and appropriate.  # Psychosocial Support:  - Wife  # Discharge Planning: To Be Determined  Thank you for allowing the palliative care team to participate in the care [name].  Tinnie Radar, DO Palliative Care Provider PMT # [phone]  If patient remains symptomatic despite maximum doses, please call PMT at [phone] between 0700 and 1900. Outside of these hours, please call attending, as PMT does not have night coverage.  Personally spent 25 minutes in patient care including extensive chart review (labs, imaging, progress/consult notes, vital signs), medically appropraite exam, discussed with treatment team, education to patient, family, and staff, documenting clinical information, medication review and management, coordination of care, and available advanced directive documents.

## 2024-09-16 NOTE — Progress Notes (Signed)
 [id number] [facility] Liaison Note  Received a request from Transitions of Care Manager for outpatient palliative / hospice services after discharge. Spoke with [name], spouse, to initiate education related to outpatient palliative and hospice philosophy, services and team approach to care. [name] verbalized understanding of information given.  Per discussion, [name] is uncertain of a plan or needs at this time. She wishes to watch the patient for a few more days to see if he responds to treatments.  Liaison encouraged [name] to reach out with any questions or concerns she may have.  She was appreciative of the information.  Please call with any questions or concerns.  Thank you for the opportunity to participate in this patient's care.  Inocente Jacobs, BSN, RN [employer] [phone]

## 2024-09-16 NOTE — Progress Notes (Signed)
 Patient at MRI. Will attempt EEG tomorrow when schedule permits.

## 2024-09-17 ENCOUNTER — Inpatient Hospital Stay

## 2024-09-17 DIAGNOSIS — J984 Other disorders of lung: Secondary | ICD-10-CM | POA: Diagnosis not present

## 2024-09-17 DIAGNOSIS — Z66 Do not resuscitate: Secondary | ICD-10-CM

## 2024-09-17 DIAGNOSIS — Z711 Person with feared health complaint in whom no diagnosis is made: Secondary | ICD-10-CM

## 2024-09-17 DIAGNOSIS — R509 Fever, unspecified: Secondary | ICD-10-CM | POA: Diagnosis not present

## 2024-09-17 DIAGNOSIS — G936 Cerebral edema: Secondary | ICD-10-CM | POA: Diagnosis not present

## 2024-09-17 DIAGNOSIS — C7931 Secondary malignant neoplasm of brain: Secondary | ICD-10-CM | POA: Diagnosis not present

## 2024-09-17 DIAGNOSIS — Z79899 Other long term (current) drug therapy: Secondary | ICD-10-CM

## 2024-09-17 DIAGNOSIS — Z7189 Other specified counseling: Secondary | ICD-10-CM

## 2024-09-17 DIAGNOSIS — G893 Neoplasm related pain (acute) (chronic): Secondary | ICD-10-CM

## 2024-09-17 DIAGNOSIS — Z515 Encounter for palliative care: Secondary | ICD-10-CM | POA: Diagnosis not present

## 2024-09-17 LAB — CBC
HCT: 29.3 % — ABNORMAL LOW (ref 39.0–52.0)
Hemoglobin: 9.1 g/dL — ABNORMAL LOW (ref 13.0–17.0)
MCH: 25.6 pg — ABNORMAL LOW (ref 26.0–34.0)
MCHC: 31.1 g/dL (ref 30.0–36.0)
MCV: 82.3 fL (ref 80.0–100.0)
Platelets: 278 K/uL (ref 150–400)
RBC: 3.56 MIL/uL — ABNORMAL LOW (ref 4.22–5.81)
RDW: 15.3 % (ref 11.5–15.5)
WBC: 7.6 K/uL (ref 4.0–10.5)
nRBC: 2.5 % — ABNORMAL HIGH (ref 0.0–0.2)

## 2024-09-17 LAB — RENAL FUNCTION PANEL
Albumin: 2.6 g/dL — ABNORMAL LOW (ref 3.5–5.0)
Anion gap: 15 (ref 5–15)
BUN: 28 mg/dL — ABNORMAL HIGH (ref 8–23)
CO2: 18 mmol/L — ABNORMAL LOW (ref 22–32)
Calcium: 8.5 mg/dL — ABNORMAL LOW (ref 8.9–10.3)
Chloride: 103 mmol/L (ref 98–111)
Creatinine, Ser: 1.16 mg/dL (ref 0.61–1.24)
GFR, Estimated: >60 mL/min (ref >60)
Glucose, Bld: 165 mg/dL — ABNORMAL HIGH (ref 70–99)
Phosphorus: 2.1 mg/dL — ABNORMAL LOW (ref 2.5–4.6)
Potassium: 3.8 mmol/L (ref 3.5–5.1)
Sodium: 136 mmol/L (ref 135–145)

## 2024-09-17 LAB — CULTURE, BLOOD (ROUTINE X 2)
Culture: NO GROWTH
Culture: NO GROWTH
Special Requests: ADEQUATE
Special Requests: ADEQUATE

## 2024-09-17 LAB — MAGNESIUM: Magnesium: 2 mg/dL (ref 1.7–2.4)

## 2024-09-17 MED ORDER — HALOPERIDOL LACTATE 5 MG/ML IJ SOLN: 1.0000 mg | INTRAMUSCULAR | Status: DC | PRN

## 2024-09-17 MED ORDER — POLYVINYL ALCOHOL 1.4 % OP SOLN: 1.0000 [drp] | Freq: Four times a day (QID) | OPHTHALMIC | Status: DC | PRN

## 2024-09-17 MED ORDER — GLYCOPYRROLATE 0.2 MG/ML IJ SOLN
0.2000 mg | INTRAMUSCULAR | Status: DC | PRN
  Administered 2024-09-18 (×3): 0.2 mg via INTRAVENOUS
  Filled 2024-09-17 (×3): qty 1

## 2024-09-17 MED ORDER — HYDROMORPHONE HCL 1 MG/ML IJ SOLN
0.5000 mg | INTRAMUSCULAR | Status: DC | PRN
  Administered 2024-09-17 (×3): 1 mg via INTRAVENOUS
  Administered 2024-09-18: 2 mg via INTRAVENOUS
  Administered 2024-09-18 (×3): 1 mg via INTRAVENOUS
  Filled 2024-09-17 (×6): qty 1
  Filled 2024-09-17: qty 2

## 2024-09-17 MED ORDER — CARMEX CLASSIC LIP BALM EX OINT
TOPICAL_OINTMENT | CUTANEOUS | Status: DC | PRN
  Filled 2024-09-17: qty 10

## 2024-09-17 MED ORDER — BIOTENE DRY MOUTH MT LIQD: 15.0000 mL | OROMUCOSAL | Status: DC | PRN

## 2024-09-17 NOTE — Assessment & Plan Note (Addendum)
 On 3rd line of therapy.  No additional treatment is feasible in his setting.   With intracranial hemorrhage from underlying metastatic lesion. Discussed GOC and hospice care.  Appreciate palliative care support and hospice consult.

## 2024-09-17 NOTE — Progress Notes (Signed)
 PT Note  Patient Details Name: [name] MRN: [id number] DOB: [birth year]   Cancelled Treatment:    Reason Eval/Treat Not Completed: Other (comment). Pt to transition to hospice services. PT is not warranted. Thank you for this referral.   [place], PT Acute Rehab   [place] VEAR Drone 09/17/2024, 11:31 AM

## 2024-09-17 NOTE — Assessment & Plan Note (Signed)
 Negative infection workup. Likely related to intracranial hemorrhage

## 2024-09-17 NOTE — Hospital Course (Signed)
 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 Imaging studies consistent with bilateral lower lobe pneumonia/pneumonitis. Patient remains persistently febrile despite being on antibiotics for last 2 days. Cultures are negative  MRI brain shows evidence of intracranial hemorrhage with cerebral edema associated with his cancer. Now plan for comfort care.   Assessment and Plan: Community-acquired pneumonia versus pneumonitis. Treated with IV antibiotic. Without significant improvement. Steroids were added. Antibiotics were adjusted as well.   Rash on chest Vistaril as needed.  Resolved.  Goals of care conversation. Patient with stage IV renal cell carcinoma with brain metastasis. Completed chemoradiation. Progressively declining. MRI brain this admission shows evidence of cystic lesion turning into intracranial hemorrhage with cerebral edema. Likely cause of his decline right now. Palliative care and oncology discussed with family, family transition to complete comfort right now. With poor p.o. intake, anticipate inpatient hospice placement.   Pain control. Continue comfort for now.   RCC with brain metastasis. Had some dizziness. CT head unremarkable.  MRI shows intracranial hemorrhage in the cystic lesion.   Poor p.o. intake. Anorexia. Was on Decadron .   HTN. Patient is on amlodipine  at home. As well as lisinopril  HCTZ. Medication currently on hold. Receiving IV fluid.   BPH. On Flomax  and finasteride . Continue.   GERD. Continue PPI.   HLD. On statin. Currently on hold.   Mild elevation of LFT. Monitor for now.  CKD stage IIIb. Baseline renal creatinine around 1.4 lately. Currently stable.

## 2024-09-17 NOTE — Assessment & Plan Note (Addendum)
 Discussed goals of care with wife at bedside with other team regarding his care.

## 2024-09-17 NOTE — Progress Notes (Signed)
 Daily Progress Note   Patient Name: DEVYNN SCHEFF       Date: 09/17/2024 DOB: June 17, 1946  Age: 78 y.o. MRN#: [id number] Attending Physician: Tobie Yetta HERO, MD Primary Care Physician: Seabron Lenis, MD Admit Date: 09/12/2024 Length of Stay: 5 days  Reason for Consultation/Follow-up: Establishing goals of care  Subjective:   Reviewed EMR including recent documentation from hospitalist and oncologist.  Personally reviewed brain MRI obtained yesterday which noted cystic lesion in left frontal lobe is now hemorrhaging and has associated edema. Discussed care with hospitalist, oncologist, [facility] liaison, and bedside RN to coordinate care.  Hospitalist has already discussed care with patient's wife this morning and updated on MRI findings.  Patient appropriate for hospice at this time.  ------------------------------------------------------------------------------------------------------------- Advance Care Planning Conversation  Pertinent diagnosis: Metastatic renal cell carcinoma with brain metastases, hemorrhage from brain met, anorexia, debility, and pneumonitis versus CAP  The patient's wife consented to a voluntary Advance Care Planning Conversation in person. Individuals present for the conversation: Patient unable to participate in complex medical decision making due to underlying medical status.  This palliative medicine provider discussed care with patient's wife, [name], at bedside.  Summary of the conversation:  Presented to bedside to see patient.  Patient laying in bed, nonverbal.  Noted during visit, patient was continuously showing nonverbal signs of pain with grimacing and increased work of breathing.  Discussed care with patient's wife, [name], at bedside.  Wife was able to share updates she had heard from oncologist.  She acknowledges that patient is reaching end-of-life.  Spent time providing emotional support via active listening.  Wife noted that she has had other family members  who received hospice care at [facility] and it was wonderful.  She noted that she has seen changes in the patient that she had seen previously in other dying family members.  Patient no longer able to swallow pills and is not eating and drinking.  Discussed based on current evaluation, time is growing short.  Discussed transition to full comfort focused care at this time and what that would and would not entail.  Discussed discontinuing interventions that are not focused on comfort and instead providing medications for management of symptoms at end-of-life such as pain, nausea vomiting, and anxiety.  Wife agreeing with transition to full comfort focused care at this time.  Wife requesting that morphine  not be used due to prior family interactions with this medication.  Discussed use of as needed Dilaudid  instead which she agrees with using.  Discussed referral for [facility] evaluation.  Wife noting that she is going to speak with their children to update them today.  Their daughter is coming into town tomorrow and will be present at bedside around 3 PM.  Discussed continuing comfort measures here until that time while acknowledging appropriateness to get to [facility] after family has had time to discuss at visit.  Wife agreeing with this plan.  All questions answered at that time.  Wife appropriately tearful during visit.  Spent time providing emotional support.  Noted palliative medicine team continue to follow along with patient's medical journey.  Outcome of the conversations and/or documents completed:  Transition to full comfort focused care at this time.  Will discontinue interventions not focused on patient's symptom management at end-of-life.  I spent 35 minutes providing separately identifiable ACP services with the patient and/or surrogate decision maker in a voluntary, in-person conversation discussing the patient's wishes and goals as detailed in the above note.  Tinnie Radar,  DO Palliative Medicine Provider  -------------------------------------------------------------------------------------------------------------  Discussed care with hospitalist, oncologist, TOC, [facility] liaison, bedside RN, and PT/OT after visit to coordinate care and transition to full comfort focused care at this time.  Objective:   Vital Signs:  BP (!) 151/84 (BP Location: Right Arm)   Pulse 82   Temp 97.8 F (36.6 C)   Resp 17   Ht 6' 3 (1.905 m)   Wt 87.7 kg   SpO2 94%   BMI 24.17 kg/m   Physical Exam: General: Grimacing at times, nonverbal, chronically ill-appearing, frail Cardiovascular: RRR Respiratory: increased work of breathing noted at times during visit, not in respiratory distress Neuro: nonverbal  Assessment & Plan:   Assessment: Patient is a 78 year old male with a past medical history of metastatic renal cell carcinoma with disease to brain, GERD, BPH, and hyperlipidemia who was admitted on 09/12/2024 for management of fever, chills, and sore throat at home.  During hospitalization patient has received management for pneumonia though remains persistently febrile despite antibiotics; concern for underlying pneumonitis.  Oncology consulted for recommendations.  Palliative medicine team consulted to assist with complex medical decision making.  Recommendations/Plan: # Complex medical decision making/goals of care:  # Complex medical decision making/goals of care  -Patient unable to participate in medical decision making secondary to mental status.  -Spoke with patient's wife, [name], at bedside as detailed above in HPI.  MRI obtained showing lesion with hemorrhage and edema affect.  Patient's clinical status worsening is now unable to swallow oral pills and not eating and drinking.  Discussed transitioning to full comfort focused care at this time to manage symptoms at end-of-life and discontinue interventions that are no longer focused on patient's comfort and symptom  management.  Wife agreeing with transition to comfort focused care at this time.  Wife to update children regarding patient's medical status.  Daughter planning to be at bedside tomorrow around 3 PM.  Continuing comfort focused measures in the hospital at this time.  [facility] liaison already involved for possible [facility] transfer after family has had time to visit.  -At this time we will discontinue interventions that are no longer focused on comfort such as IV fluids, imaging, or lab work.  Will instead focus on symptom management of pain, dyspnea, and agitation in the setting of end-of-life care.    Code Status: Do not attempt resuscitation (DNR) - Comfort care  # Symptom management Patient is receiving these palliative interventions for symptom management with an intent to improve quality of life.     -Pain/Dyspnea, acute in the setting of end-of-life care                               -Start IV Dilaudid  0.5-2 mg every 1 hour as needed.  Continue to adjust based on patient's symptom burden.  If patient needing frequent dosing, may need to consider continuous infusion.    - Continue dexamethasone  IV 6 mg daily to help with cerebral edema from hemorrhage                 -Anxiety/agitation, in the setting of end-of-life care                               -Start IV Haldol 1 mg every 4 hours as needed. Continue to adjust based on patient's symptom burden.                   -  Secretions, in the setting of end-of-life care                               -Start IV glycopyrrolate 0.2 mg every 4 hours as needed.  # Discharge Planning: To Be Determined  Thank you for allowing the palliative care team to participate in the care Jacorion E Prada.  Tinnie Radar, DO Palliative Care Provider PMT # [phone]  If patient remains symptomatic despite maximum doses, please call PMT at [phone] between 0700 and 1900. Outside of these hours, please call attending, as PMT does not have night  coverage.  Billing based on MDM: High  Problems Addressed: One or more chronic illnesses with severe exacerbation, progression, or side effects of treatment.  Risks: Parenteral controlled substances

## 2024-09-17 NOTE — Assessment & Plan Note (Addendum)
 With underlying hemorrhage from metastases Continue dexamethasone  as able for comfort measures

## 2024-09-17 NOTE — Progress Notes (Signed)
 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 Imaging studies consistent with bilateral lower lobe pneumonia/pneumonitis. Patient remains persistently febrile despite being on antibiotics for last 2 days. Cultures are negative  MRI brain shows evidence of intracranial hemorrhage with cerebral edema associated with his cancer. Now plan for comfort care.   Assessment and Plan: Community-acquired pneumonia versus pneumonitis. Treated with IV antibiotic. Without significant improvement. Steroids were added. Antibiotics were adjusted as well.   Rash on chest Vistaril as needed.  Resolved.  Goals of care conversation. Patient with stage IV renal cell carcinoma with brain metastasis. Completed chemoradiation. Progressively declining. MRI brain this admission shows evidence of cystic lesion turning into intracranial hemorrhage with cerebral edema. Likely cause of his decline right now. Palliative care and oncology discussed with family, family transition to complete comfort right now. With poor p.o. intake, anticipate inpatient hospice placement.   Pain control. Continue comfort for now.   RCC with brain metastasis. Had some dizziness. CT head unremarkable.  MRI shows intracranial hemorrhage in the cystic lesion.   Poor p.o. intake. Anorexia. Was on Decadron .   HTN. Patient is on amlodipine  at home. As well as lisinopril  HCTZ. Medication currently on hold. Receiving IV fluid.   BPH. On Flomax  and finasteride . Continue.   GERD. Continue PPI.   HLD. On statin. Currently on hold.   Mild elevation of LFT. Monitor for now.  CKD stage IIIb. Baseline renal creatinine around 1.4  lately. Currently stable.   Subjective: Sleepy.  No nausea no vomiting no fever no chills.  Physical Exam: Clear to auscultation. S1-S2 present Bowel sound present Trace edema. Lethargic unable to follow commands.  Data Reviewed: I have Reviewed nursing notes, Vitals, and Lab results. I have discussed pt's care plan and test results with discussed with palliative care.  .   Disposition: Status is: Inpatient Remains inpatient appropriate because: Awaiting placement to residential hospice.  Family Communication: Discussed with wife at bedside. Level of care: Telemetry switch to MedSurg. Vitals:   09/16/24 2114 09/16/24 2152 09/16/24 2341 09/17/24 0516  BP: 133/79   (!) 151/84  Pulse: 83   82  Resp: 18 (!) 22 19 17   Temp: 98.4 F (36.9 C)   97.8 F (36.6 C)  TempSrc: Oral     SpO2: 94%   94%  Weight:      Height:         Author: Yetta Blanch, MD 09/17/2024 5:15 PM  Please look on [email or web address] to find out who is on call.

## 2024-09-17 NOTE — Assessment & Plan Note (Signed)
 Report of rash developed after Solu-Medrol . Patient is currently on dexamethasone .  May continue also for cerebral edema.

## 2024-09-17 NOTE — Plan of Care (Signed)
  Problem: Education: Goal: Knowledge of General Education information will improve Description: Including pain rating scale, medication(s)/side effects and non-pharmacologic comfort measures Outcome: Progressing   Problem: Health Behavior/Discharge Planning: Goal: Ability to manage health-related needs will improve Outcome: Progressing   Problem: Clinical Measurements: Goal: Ability to maintain clinical measurements within normal limits will improve Outcome: Progressing Goal: Will remain free from infection Outcome: Progressing Goal: Diagnostic test results will improve Outcome: Progressing Goal: Respiratory complications will improve Outcome: Progressing Goal: Cardiovascular complication will be avoided Outcome: Progressing   Problem: Activity: Goal: Risk for activity intolerance will decrease Outcome: Progressing   Problem: Nutrition: Goal: Adequate nutrition will be maintained Outcome: Progressing   Problem: Coping: Goal: Level of anxiety will decrease Outcome: Progressing   Problem: Elimination: Goal: Will not experience complications related to bowel motility Outcome: Progressing Goal: Will not experience complications related to urinary retention Outcome: Progressing   Problem: Pain Managment: Goal: General experience of comfort will improve and/or be controlled Outcome: Progressing   Problem: Safety: Goal: Ability to remain free from injury will improve Outcome: Progressing   Problem: Skin Integrity: Goal: Risk for impaired skin integrity will decrease Outcome: Progressing   Problem: Activity: Goal: Ability to tolerate increased activity will improve Outcome: Progressing   Problem: Clinical Measurements: Goal: Ability to maintain a body temperature in the normal range will improve Outcome: Progressing   Problem: Respiratory: Goal: Ability to maintain adequate ventilation will improve Outcome: Progressing Goal: Ability to maintain a clear airway  will improve Outcome: Progressing   Problem: Education: Goal: Knowledge of the prescribed therapeutic regimen will improve Outcome: Progressing   Problem: Coping: Goal: Ability to identify and develop effective coping behavior will improve Outcome: Progressing   Problem: Clinical Measurements: Goal: Quality of life will improve Outcome: Progressing   Problem: Respiratory: Goal: Verbalizations of increased ease of respirations will increase Outcome: Progressing   Problem: Role Relationship: Goal: Family's ability to cope with current situation will improve Outcome: Progressing Goal: Ability to verbalize concerns, feelings, and thoughts to partner or family member will improve Outcome: Progressing   Problem: Pain Management: Goal: Satisfaction with pain management regimen will improve Outcome: Progressing

## 2024-09-17 NOTE — Assessment & Plan Note (Signed)
 Fever, with underlying brain metastases Trial of steroid as above

## 2024-09-17 NOTE — Evaluation (Signed)
 SLP Cancellation Note  Patient Details Name: ALEKSANDER EDMISTON MRN: [id number] DOB: 02-23-1946   Cancelled treatment:       Reason Eval/Treat Not Completed: Other (comment) (Today pt sleepy and dysarthric, note results of MRI brain and CXR.  Messaged MDs *Dr Clayton and Dr Tobie* to seek guidance given findings; Dr Clayton advised appropriate to hold on evaluation at this time.)   Nicolas Emmie Caldron 09/17/2024, 8:54 AM

## 2024-09-17 NOTE — Progress Notes (Signed)
 Chaplain responded to spiritual care consult for EOL support. Upon entering the room, wife [name] was bedside and indicated chaplain presence was not needed at the time. Pt [name] was asleep/unresponsive. I assured [name] of our services. She thanked me and I left out of respect for their privacy and wishes.  Chaplains continue to remain available as needed.

## 2024-09-17 NOTE — Progress Notes (Addendum)
 [name]   DOB:[birth year]   FM#:[id number]    ASSESSMENT & PLAN:  78 y.o.male with past medical history of stage 4 RCC with brain metastases on third line therapy, GERD, BPH, hyperlipidemi admitted for fever and weakness.   Clinically without improvement from antibiotics, extensive workup negative for infections.  Trial of steroid did not help.  MRI showed intracranial hemorrhage from left frontal lobe lesion.  Clinically patient has not improved over the last few days.  He is not very engaging, or able to participate in basic exam.  I discussed with wife today.  Fortunately, hemorrhagic can occur with brain metastases.  This may be an ongoing issue moving forward.  Steroid may be able to provide temporary relief.  However, he likely will experience similar issues in the future.  She has talked to hospice yesterday.  She voiced she cannot take care of him at home.  I expressed to her hospice is reasonable.  She has come to realization that he has limited time and wants to do what is best for him.    Her daughter is coming tomorrow from [place] . She is planning on discussed with hospice team again today on her options.  Assessment & Plan Metastatic renal cell carcinoma to brain [facility]) On 3rd line of therapy.  No additional treatment is feasible in his setting.   With intracranial hemorrhage from underlying metastatic lesion. Discussed GOC and hospice care.  Appreciate palliative care support and hospice consult. Cerebral edema (HCC) With underlying hemorrhage from metastases Continue dexamethasone  as able for comfort measures  Pneumonitis Report of rash developed after Solu-Medrol . Patient is currently on dexamethasone .  May continue also for cerebral edema. General weakness Fever, with underlying brain metastases Trial of steroid as above Fever Negative infection workup. Likely related to intracranial hemorrhage Counseling and coordination of care Discussed goals of care with wife at  bedside with other team regarding his care.  Code Status DNR  Goals of care Discussed as above.  Hospice is appropriate.  Discharge planning Pending evaluation for hospice location  All questions were answered.    Thank you for the consult.  Appreciate excellent care from hospitalist team, palliative care, hospice, nursing and other staff members.  Please do not hesitate to call me if any questions.  Garrett [name], MD 09/17/2024 9:15 AM  Subjective:  Wife at bedside today.  Report that he continue to decline, with generalized weakness.  He has not improved much.  Reports some weakness more on the right yesterday.  Objective:  Vitals:   09/16/24 2341 09/17/24 0516  BP:  (!) 151/84  Pulse:  82  Resp: 19 17  Temp:  97.8 F (36.6 C)  SpO2:  94%     Intake/Output Summary (Last 24 hours) at 09/17/2024 0915 Last data filed at 09/17/2024 0630 Gross per 24 hour  Intake 1706.07 ml  Output 300 ml  Net 1406.07 ml    GENERAL: Open his eyes briefly with voice.  No distress  EYES: sclera clear LUNGS: No wheeze, rales and clear to auscultation bilaterally with normal breathing effort.  HEART: regular rate & rhythm  ABDOMEN: abdomen slightly distended, generalized discomfort when palpated NEURO: Open his eyes briefly with voice and command.  But not able to participate in basic neurologic exam   Labs:  Recent Labs    09/11/24 0841 09/12/24 1502 09/13/24 0056 09/14/24 0648 09/16/24 0825 09/17/24 0601  NA 131* 132*   < > 132* 134* 136  K 3.6 3.6   < >  3.7 3.8 3.8  CL 93* 95*   < > 94* 99 103  CO2 30 22   < > 22 19* 18*  GLUCOSE 213* 153*   < > 109* 116* 165*  BUN 30* 25*   < > 15 21 28*  CREATININE 1.93* 1.51*   < > 1.27* 1.23 1.16  CALCIUM  9.6 9.2   < > 8.9 8.8* 8.5*  GFRNONAA 35* 48*   < > 59* >60 >60  PROT 7.1 6.7  --  6.1*  --   --   ALBUMIN 3.5 3.3*  --  3.0*  --  2.6*  AST 69* 74*  --  89*  --   --   ALT 59* 54*  --  44  --   --   ALKPHOS 102 106  --  108  --    --   BILITOT 0.6 0.5  --  0.5  --   --    < > = values in this interval not displayed.    Studies:  MR BRAIN WO CONTRAST Result Date: 09/16/2024 CLINICAL DATA:  Altered mental status, history of stage IV renal cell carcinoma with brain metastases EXAM: MRI HEAD WITHOUT CONTRAST TECHNIQUE: Multiplanar, multiecho pulse sequences of the brain and surrounding structures were obtained without intravenous contrast. COMPARISON:  August 07, 2024 brain MRI, September 12, 2024 head CT FINDINGS: MRI brain: There are innumerable foci of magnetic susceptibility throughout the brain and cerebellum. A cystic lesion previously noted in the medial left frontal lobe has undergone hemorrhage and enlarged, now measuring 2.2 x 1.7 x 2 cm. There is some surrounding edema. A cystic lesion in the right parietal lobe measuring about 15 mm is unchanged. A cystic lesion previously noted in the mesial left temporal lobe is smaller There is no acute or chronic infarct. No hydrocephalus There are normal flow signals in the carotid arteries and basilar artery. No significant bone marrow signal abnormality. No significant abnormality in the paranasal sinuses or soft tissues. IMPRESSION: 1. Cystic lesion in the medial left frontal lobe has undergone hemorrhage and has enlarged, now measuring 2.2 x 1.7 x 2 cm with some surrounding edema 2. The other previously identified cystic lesions are stable or smaller 3. Innumerable foci of magnetic susceptibility consistent with chronic microhemorrhages throughout the brain and cerebellum are unchanged 4. No acute infarct Electronically Signed   By: Nancyann Burns M.D.   On: 09/16/2024 16:48   DG CHEST PORT 1 VIEW Result Date: 09/16/2024 CLINICAL DATA:  Shortness of breath. EXAM: PORTABLE CHEST 1 VIEW COMPARISON:  Radiograph and CT 09/12/2024 FINDINGS: Post median sternotomy. Stable heart size and mediastinal contours. Increasing ill-defined opacity in the both perihilar regions and left lung  base. No pleural effusion or pneumothorax. Stable osseous structures. IMPRESSION: Increasing ill-defined opacity in both perihilar region and left lung base. Differential considerations include pulmonary edema, infection, or hemorrhage. Electronically Signed   By: Andrea Gasman M.D.   On: 09/16/2024 13:37   CT Angio Chest PE W and/or Wo Contrast Result Date: 09/12/2024 EXAM: CTA CHEST PE  WITH CONTRAST CT ABDOMEN AND PELVIS  WITH CONTRAST 09/12/2024 06:06:11 PM TECHNIQUE: CTA of the chest was performed after the administration of intravenous contrast. Multiplanar reformatted images are provided for review. MIP images are provided for review. CT of the abdomen and pelvis was performed with the administration of intravenous contrast. Automated exposure control, iterative reconstruction, and/or weight based adjustment of the mA/kV was utilized to reduce the radiation dose to as  low as reasonably achievable. COMPARISON: CT 06/11/2024. CLINICAL HISTORY: Pulmonary embolism (PE) suspected, high probability. Confusion, fever (103F), and decreased responsiveness. History of renal cell carcinoma with brain metastasis, on immunologic/chemotherapy meds (currently on hold). Denies seizures, headache, neck pain, nausea, vomiting, or diarrhea. Poor oral intake. Palliative care evaluation pending. FINDINGS: CHEST: PULMONARY ARTERIES: Pulmonary arteries are adequately opacified for evaluation. No intraluminal filling defect to suggest pulmonary embolism. Main pulmonary artery is normal in caliber. MEDIASTINUM: No mediastinal lymphadenopathy. The heart and pericardium demonstrate no acute abnormality. There is no acute abnormality of the thoracic aorta. LUNGS AND PLEURA: Patchy bilateral ground-glass opacities in a peribronchovascular distribution with more confluent airspace opacities in the left greater than right lower lobes. Numerous pulmonary cysts. No pleural effusion or pneumothorax. Unchanged 6 mm nodule on the left  lower lobe on series 9 image 243. SOFT TISSUES AND BONES: No acute bone or soft tissue abnormality. ABDOMEN AND PELVIS: LIVER: Hepatic steatosis. GALLBLADDER AND BILE DUCTS: Gallbladder is unremarkable. No biliary ductal dilatation. SPLEEN: Spleen demonstrates no acute abnormality. PANCREAS: Cystic lesion in the tail of the pancreas is unchanged again measuring 1.3 cm. No pancreatic ductal dilation or evidence of acute pancreatitis. ADRENAL GLANDS: Right adrenalectomy. Left adrenal cysts. KIDNEYS, URETERS AND BLADDER: Right nephrectomy. No stones in the left kidney or ureters. No hydronephrosis. No perinephric or periureteral stranding. Urinary bladder is unremarkable. GI AND BOWEL: Stomach and duodenal sweep demonstrate no acute abnormality. There is no bowel obstruction. No abnormal bowel wall thickening or distension. Normal appendix. REPRODUCTIVE: Metallic seeds in the prostate. Enlarged prostate. PERITONEUM AND RETROPERITONEUM: No ascites or free air. LYMPH NODES: No lymphadenopathy. BONES AND SOFT TISSUES: No acute abnormality of the visualized bones. Similar mildly enhancing soft tissue nodule in the right nephrectomy bed overlying the most superior portion of the right psoas measuring 2.0 x 0.7 cm. IMPRESSION: 1. No evidence of pulmonary embolism. 2. Patchy bilateral ground-glass and confluent lower lobe airspace opacities. Differential considerations include multifocal pneumonia, edema, alveolar hemorrhage, or acute lung injury. 3. No acute abdominal or pelvic abnormality. Electronically signed by: Norman Gatlin MD 09/12/2024 06:55 PM EDT RP Workstation: [id number]   CT ABDOMEN PELVIS W CONTRAST Result Date: 09/12/2024 EXAM: CTA CHEST PE  WITH CONTRAST CT ABDOMEN AND PELVIS  WITH CONTRAST 09/12/2024 06:06:11 PM TECHNIQUE: CTA of the chest was performed after the administration of intravenous contrast. Multiplanar reformatted images are provided for review. MIP images are provided for review. CT of the  abdomen and pelvis was performed with the administration of intravenous contrast. Automated exposure control, iterative reconstruction, and/or weight based adjustment of the mA/kV was utilized to reduce the radiation dose to as low as reasonably achievable. COMPARISON: CT 06/11/2024. CLINICAL HISTORY: Pulmonary embolism (PE) suspected, high probability. Confusion, fever (103F), and decreased responsiveness. History of renal cell carcinoma with brain metastasis, on immunologic/chemotherapy meds (currently on hold). Denies seizures, headache, neck pain, nausea, vomiting, or diarrhea. Poor oral intake. Palliative care evaluation pending. FINDINGS: CHEST: PULMONARY ARTERIES: Pulmonary arteries are adequately opacified for evaluation. No intraluminal filling defect to suggest pulmonary embolism. Main pulmonary artery is normal in caliber. MEDIASTINUM: No mediastinal lymphadenopathy. The heart and pericardium demonstrate no acute abnormality. There is no acute abnormality of the thoracic aorta. LUNGS AND PLEURA: Patchy bilateral ground-glass opacities in a peribronchovascular distribution with more confluent airspace opacities in the left greater than right lower lobes. Numerous pulmonary cysts. No pleural effusion or pneumothorax. Unchanged 6 mm nodule on the left lower lobe on series 9 image 243. SOFT TISSUES  AND BONES: No acute bone or soft tissue abnormality. ABDOMEN AND PELVIS: LIVER: Hepatic steatosis. GALLBLADDER AND BILE DUCTS: Gallbladder is unremarkable. No biliary ductal dilatation. SPLEEN: Spleen demonstrates no acute abnormality. PANCREAS: Cystic lesion in the tail of the pancreas is unchanged again measuring 1.3 cm. No pancreatic ductal dilation or evidence of acute pancreatitis. ADRENAL GLANDS: Right adrenalectomy. Left adrenal cysts. KIDNEYS, URETERS AND BLADDER: Right nephrectomy. No stones in the left kidney or ureters. No hydronephrosis. No perinephric or periureteral stranding. Urinary bladder is  unremarkable. GI AND BOWEL: Stomach and duodenal sweep demonstrate no acute abnormality. There is no bowel obstruction. No abnormal bowel wall thickening or distension. Normal appendix. REPRODUCTIVE: Metallic seeds in the prostate. Enlarged prostate. PERITONEUM AND RETROPERITONEUM: No ascites or free air. LYMPH NODES: No lymphadenopathy. BONES AND SOFT TISSUES: No acute abnormality of the visualized bones. Similar mildly enhancing soft tissue nodule in the right nephrectomy bed overlying the most superior portion of the right psoas measuring 2.0 x 0.7 cm. IMPRESSION: 1. No evidence of pulmonary embolism. 2. Patchy bilateral ground-glass and confluent lower lobe airspace opacities. Differential considerations include multifocal pneumonia, edema, alveolar hemorrhage, or acute lung injury. 3. No acute abdominal or pelvic abnormality. Electronically signed by: Norman Gatlin MD 09/12/2024 06:55 PM EDT RP Workstation: [id number]   CT Head Wo Contrast Result Date: 09/12/2024 CLINICAL DATA:  Worsening headache. Fever. Undergoing chemotherapy for renal cell carcinoma. EXAM: CT HEAD WITHOUT CONTRAST TECHNIQUE: Contiguous axial images were obtained from the base of the skull through the vertex without intravenous contrast. RADIATION DOSE REDUCTION: This exam was performed according to the departmental dose-optimization program which includes automated exposure control, adjustment of the mA and/or kV according to patient size and/or use of iterative reconstruction technique. COMPARISON:  09/01/2024 FINDINGS: Brain: No evidence of intracranial hemorrhage, acute infarction, hydrocephalus, extra-axial collection, or mass lesion/mass effect. Stable mild cerebral atrophy and moderate chronic small vessel disease. 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 Several small cystic lesions in the medial left frontal and right parietal lobes remains stable. Vascular:  No hyperdense vessel or other acute findings. Skull: No  evidence of fracture or other significant bone abnormality. Sinuses/Orbits:  No acute findings. Other: None. IMPRESSION: No acute intracranial abnormality. Stable cerebral atrophy and chronic small vessel disease. a Stable small cystic lesions in the medial left frontal and right parietal lobes. Electronically Signed   By: Norleen DELENA Kil M.D.   On: 09/12/2024 18:27   DG Chest Portable 1 View Result Date: 09/12/2024 CLINICAL DATA:  fever EXAM: PORTABLE CHEST - 1 VIEW COMPARISON:  09/01/2024 FINDINGS: Mild elevation of the right hemidiaphragm due to low lung volumes. No focal airspace consolidation, pleural effusion, or pneumothorax. The cardiac silhouette is at the upper limits of normal, likely accentuated by AP technique and low lung volumes. Sternotomy wires. Tortuous aorta with aortic atherosclerosis. No acute fracture or destructive lesions. Multilevel thoracic osteophytosis. IMPRESSION: Low lung volumes.  Otherwise, no acute cardiopulmonary abnormality. Electronically Signed   By: Rogelia Myers M.D.   On: 09/12/2024 15:04   DG Chest Portable 1 View Result Date: 09/01/2024 CLINICAL DATA:  Fatigue EXAM: PORTABLE CHEST 1 VIEW COMPARISON:  December 13, 2023 FINDINGS: Previous median sternotomy. The heart size is normal. There is no pleural effusion. The lungs are clear. No fracture or bone lesion identified. IMPRESSION: No acute disease Electronically Signed   By: Nancyann Burns M.D.   On: 09/01/2024 09:57   CT Head Wo Contrast Result Date: 09/01/2024 CLINICAL DATA:  Headache and fatigue  EXAM: CT HEAD WITHOUT CONTRAST TECHNIQUE: Contiguous axial images were obtained from the base of the skull through the vertex without intravenous contrast. RADIATION DOSE REDUCTION: This exam was performed according to the departmental dose-optimization program which includes automated exposure control, adjustment of the mA and/or kV according to patient size and/or use of iterative reconstruction technique. COMPARISON:   Brain MRI August 07, 2004 FINDINGS: CT HEAD: Small cystic lesions are noted in the medial left frontal lobe and right parietal lobe similar to the previous brain MRI. There is no hemorrhage. No acute ischemic changes. No new mass lesion identified. The ventricles are normal. Skull/sinuses/orbits: No significant abnormality. IMPRESSION: Small cystic lesions are noted in the medial left frontal lobe and right parietal lobe similar to the brain MRI from August 07, 2024. There is no hemorrhage, acute infarct or new mass identified. Electronically Signed   By: Nancyann Burns M.D.   On: 09/01/2024 09:55

## 2024-09-18 ENCOUNTER — Encounter

## 2024-09-18 DIAGNOSIS — C7931 Secondary malignant neoplasm of brain: Secondary | ICD-10-CM | POA: Diagnosis not present

## 2024-09-18 DIAGNOSIS — C649 Malignant neoplasm of unspecified kidney, except renal pelvis: Secondary | ICD-10-CM | POA: Diagnosis not present

## 2024-09-18 DIAGNOSIS — Z79899 Other long term (current) drug therapy: Secondary | ICD-10-CM | POA: Diagnosis not present

## 2024-09-18 DIAGNOSIS — R509 Fever, unspecified: Secondary | ICD-10-CM | POA: Diagnosis not present

## 2024-09-18 DIAGNOSIS — Z515 Encounter for palliative care: Secondary | ICD-10-CM | POA: Diagnosis not present

## 2024-09-18 DIAGNOSIS — R4589 Other symptoms and signs involving emotional state: Secondary | ICD-10-CM

## 2024-09-18 DIAGNOSIS — R451 Restlessness and agitation: Secondary | ICD-10-CM

## 2024-09-18 DIAGNOSIS — Z66 Do not resuscitate: Secondary | ICD-10-CM | POA: Diagnosis not present

## 2024-09-18 MED ORDER — SCOPOLAMINE 1 MG/3DAYS TD PT72: 1.0000 | MEDICATED_PATCH | TRANSDERMAL | Status: DC

## 2024-09-18 MED ORDER — SCOPOLAMINE 1 MG/3DAYS TD PT72
1.0000 | MEDICATED_PATCH | TRANSDERMAL | Status: DC
  Administered 2024-09-18: 1 mg via TRANSDERMAL
  Filled 2024-09-18: qty 1

## 2024-09-18 MED ORDER — LORAZEPAM 2 MG/ML IJ SOLN
1.0000 mg | INTRAMUSCULAR | Status: DC | PRN
  Administered 2024-09-18: 1 mg via INTRAVENOUS
  Filled 2024-09-18: qty 1

## 2024-09-18 MED ORDER — HYDROMORPHONE HCL-NACL 50-0.9 MG/50ML-% IV SOLN: 0.5000 mg/h | INTRAVENOUS | Status: DC

## 2024-09-18 MED ORDER — HYDROMORPHONE BOLUS VIA INFUSION: 1.0000 mg | INTRAVENOUS | Status: DC | PRN

## 2024-09-18 MED ORDER — HYDROMORPHONE HCL-NACL 50-0.9 MG/50ML-% IV SOLN
0.5000 mg/h | INTRAVENOUS | Status: DC
  Administered 2024-09-18 (×2): 1 mg/h via INTRAVENOUS
  Filled 2024-09-18 (×2): qty 50

## 2024-09-18 NOTE — Progress Notes (Signed)
 Daily Progress Note   Patient Name: [name]       Date: 09/18/2024 DOB: 05-03-1946  Age: 78 y.o. MRN#: [id number] Attending Physician: Tobie Yetta HERO, MD Primary Care Physician: Seabron Lenis, MD Admit Date: 09/12/2024 Length of Stay: 6 days  Reason for Consultation/Follow-up: Establishing goals of care  Subjective:   Reviewed EMR including recent documentation from hospitalist and oncologist.  At time of EMR review on past 24 hours patient has received as needed IV Dilaudid  1 mg x 5 doses and 2 mg x 1 dose.  Patient also received as needed glycopyrrolate x 1 dose.  Presented to bedside to see patient.  Patient laying in bed unresponsive to voice.  Patient will occasionally grimace and appear agitated.  Able to speak with patient's wife at bedside.  Wife planning to follow-up with their children once daughter is at bedside this afternoon.  Discussed continuing comfort focused care at this time.  After wife talks with children, plan would hopefully be to get patient transferred to [facility] for end-of-life care.  Discussed while patient remains in the hospital, will continue adjusting medications based on comfort.  Normalized end-of-life symptoms that wife has been seeing.  Spent time educating on symptoms to look for further.  Discussed with patient having worsening signs of pain and increased pain needs, would be appropriate to start continuous pain medication infusion.  Spent time answering questions regarding this.  Wife agreeing with plan.  Noted would ask RN to provide pain medication now and if continues to need frequent dosing, we will plan to start today.  Also discussed adjusting medications for end-of-life agitation.  Spent time providing emotional support via active listening.  Noted palliative medicine team to continue following patient's medical journey.  Discussed care with hospitalist, TOC, bedside RN, and [facility] liaison throughout the day to coordinate care.  Patient has been  accepted to [facility] and hopefully will transfer tonight.  Also started patient on continuous opioid infusion as patient was needing medication every hour with worsening signs of pain; wife was supporting of this.  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  During hospitalization patient has received management for pneumonia though remains persistently febrile despite antibiotics; concern for underlying pneumonitis.  Oncology consulted for recommendations.  Palliative medicine team consulted to assist with complex medical decision making.  Recommendations/Plan: # Complex medical decision making/goals of care:  # Complex medical decision making/goals of care  -Patient unable to participate in medical decision making secondary to mental status.  -Spoke with patient's wife, [name], at bedside as detailed above in HPI.  Continuing comfort focused measures in the hospital at this time for end-of-life symptom management.  Wife planning to speak with children this afternoon and hopefully patient will transfer to [facility] for end-of-life care after discussion.  [facility]  liaison already involved to assist with [facility] transfer after family has had time to visit.  Palliative medicine team continuing to follow along with patient's medical journey.  - Have discontinued  interventions that are no longer focused on comfort such as IV fluids, imaging, or lab work.  Focusing on symptom management of pain, dyspnea, and agitation in the setting of end-of-life care.    Code Status: Do not attempt resuscitation (DNR) - Comfort care  # Symptom management Patient is receiving these palliative interventions for symptom management with an intent to improve quality of life.     -Pain/Dyspnea, acute in the setting of end-of-life care                               - Discontinue IV Dilaudid  0.5-2 mg every 1 hour as needed   - Start IV Dilaudid  titratable continuous infusion with as needed bolus dosing    - Continue dexamethasone  IV 6 mg daily to help with cerebral edema from hemorrhage                 -Anxiety/agitation, in the setting of end-of-life care                               - Continue IV Haldol 1 mg every 4 hours as needed. Continue to adjust based on patient's symptom burden.    - Start IV Ativan 1 mg every 4 hours as needed                  -Secretions, in the setting of end-of-life care                               -Start IV glycopyrrolate 0.2 mg every 4 hours as needed.   - Start scopolamine patch transdermal l every 72 hours  # Discharge Planning: Hospice facility  Thank you for allowing the palliative care team to participate in the care [name].  Tinnie Radar, DO Palliative Care Provider PMT # [phone]  If patient remains symptomatic despite maximum doses, please call PMT at [phone] between 0700 and 1900. Outside of these hours, please call attending, as PMT does not have night coverage.  Billing based on MDM: High  Problems Addressed: One or more chronic illnesses with severe exacerbation, progression, or side effects of treatment.  Risks: Parenteral controlled substances

## 2024-09-18 NOTE — Discharge Summary (Signed)
 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 Imaging studies consistent with bilateral lower lobe pneumonia/pneumonitis. Patient remains persistently febrile despite being on antibiotics for last 2 days. Cultures are negative  MRI brain shows evidence of intracranial hemorrhage with cerebral edema associated with his cancer. Now plan for comfort care.   Assessment and Plan: Community-acquired pneumonia versus pneumonitis. Treated with IV antibiotic. Without significant improvement. Steroids were added. Antibiotics were adjusted as well. Now comfort care.    Rash on chest Vistaril as needed.  Resolved.  Goals of care conversation. Patient with stage IV renal cell carcinoma with brain metastasis. Completed chemoradiation. Progressively declining. MRI brain this admission shows evidence of cystic lesion turning into intracranial hemorrhage with cerebral edema. Likely cause of his decline right now. Palliative care and oncology discussed with family, family transition to complete comfort right now. With poor p.o. intake, going for inpatient hospice placement.   Pain control. Continue comfort for now.   RCC with brain metastasis. Had some dizziness. CT head unremarkable.  MRI shows intracranial hemorrhage in the cystic lesion.   Poor p.o. intake. Anorexia. Was on Decadron .   HTN. Patient is on amlodipine  at home. As well as lisinopril  HCTZ. Medication currently on hold. Received IV fluid.   BPH. Was on Flomax  and finasteride .   GERD. Continue PPI.   HLD. On  statin. Currently on hold.   Mild elevation of LFT. Monitor for now.  CKD stage IIIb. Baseline renal creatinine around 1.4 lately. Currently stable.  Consultants:  Palliative care  Oncology   Procedures performed:  none  DISCHARGE MEDICATION: Allergies as of 09/18/2024       Reactions   Bee Venom Anaphylaxis   Foaming at mouth, unresponsive   Tadalafil Other (See Comments)   Other Reaction(s): made patient feel bad and urinate more   Aspirin Nausea And Vomiting, Other (See Comments)   Penicillins Itching, Rash   Triamterene-hctz Nausea Only        Medication List     STOP taking these medications    amLODipine  10 MG tablet Commonly known as: NORVASC    amLODipine  5 MG tablet Commonly known as: NORVASC    atorvastatin  10 MG tablet Commonly known as: LIPITOR   dexamethasone  1 MG tablet Commonly known as: DECADRON    dronabinol  5 MG capsule Commonly known as: MARINOL    EPINEPHrine  0.3 mg/0.3 mL Soaj injection Commonly known as: EPI-PEN   everolimus  10 MG tablet Commonly known as: AFINITOR    finasteride  5 MG tablet Commonly known as: PROSCAR    Grape Seed Extract 100 MG Caps   latanoprost  0.005 % ophthalmic solution Commonly known as: XALATAN    lisinopril -hydrochlorothiazide  20-25 MG tablet Commonly known as: ZESTORETIC    multivitamin with minerals tablet   nystatin cream Commonly known as: MYCOSTATIN   omeprazole  20 MG capsule Commonly known as: PRILOSEC   OVER THE COUNTER MEDICATION   tamsulosin  0.4 MG Caps capsule Commonly known as: FLOMAX        TAKE these medications    HYDROmorphone  HCl-NaCl 50MG /50ML Soln Commonly known as: DILAUDID  Inject 0.5-5 mg/hr into the vein continuous.   scopolamine 1 MG/3DAYS Commonly known  as: TRANSDERM-SCOP Place 1 patch (1 mg total) onto the skin every 3 (three) days. Start taking on: September 21, 2024       Disposition: Residential hospice Diet recommendation: comfort feeds  Discharge  Exam: Vitals:   09/16/24 2114 09/16/24 2152 09/16/24 2341 09/17/24 0516  BP: 133/79   (!) 151/84  Pulse: 83   82  Resp: 18 (!) 22 19 17   Temp: 98.4 F (36.9 C)   97.8 F (36.6 C)  TempSrc: Oral     SpO2: 94%   94%  Weight:      Height:       General: in Mild distress, No Rash Cardiovascular: S1 and S2 Present, No Murmur Respiratory: increased respiratory effort, Bilateral Air entry present. No Crackles, No wheezes Extremities: No edema Neuro: drowsy and lethargic, non verbal.   Filed Weights   09/12/24 2129  Weight: 87.7 kg   Condition at discharge: stable  The results of significant diagnostics from this hospitalization (including imaging, microbiology, ancillary and laboratory) are listed below for reference.   Imaging Studies: MR BRAIN WO CONTRAST Result Date: 09/16/2024 CLINICAL DATA:  Altered mental status, history of stage IV renal cell carcinoma with brain metastases EXAM: MRI HEAD WITHOUT CONTRAST TECHNIQUE: Multiplanar, multiecho pulse sequences of the brain and surrounding structures were obtained without intravenous contrast. COMPARISON:  August 07, 2024 brain MRI, September 12, 2024 head CT FINDINGS: MRI brain: There are innumerable foci of magnetic susceptibility throughout the brain and cerebellum. A cystic lesion previously noted in the medial left frontal lobe has undergone hemorrhage and enlarged, now measuring 2.2 x 1.7 x 2 cm. There is some surrounding edema. A cystic lesion in the right parietal lobe measuring about 15 mm is unchanged. A cystic lesion previously noted in the mesial left temporal lobe is smaller There is no acute or chronic infarct. No hydrocephalus There are normal flow signals in the carotid arteries and basilar artery. No significant bone marrow signal abnormality. No significant abnormality in the paranasal sinuses or soft tissues. IMPRESSION: 1. Cystic lesion in the medial left frontal lobe has undergone hemorrhage and has enlarged, now  measuring 2.2 x 1.7 x 2 cm with some surrounding edema 2. The other previously identified cystic lesions are stable or smaller 3. Innumerable foci of magnetic susceptibility consistent with chronic microhemorrhages throughout the brain and cerebellum are unchanged 4. No acute infarct Electronically Signed   By: Nancyann Burns M.D.   On: 09/16/2024 16:48   DG CHEST PORT 1 VIEW Result Date: 09/16/2024 CLINICAL DATA:  Shortness of breath. EXAM: PORTABLE CHEST 1 VIEW COMPARISON:  Radiograph and CT 09/12/2024 FINDINGS: Post median sternotomy. Stable heart size and mediastinal contours. Increasing ill-defined opacity in the both perihilar regions and left lung base. No pleural effusion or pneumothorax. Stable osseous structures. IMPRESSION: Increasing ill-defined opacity in both perihilar region and left lung base. Differential considerations include pulmonary edema, infection, or hemorrhage. Electronically Signed   By: Andrea Gasman M.D.   On: 09/16/2024 13:37   CT Angio Chest PE W and/or Wo Contrast Result Date: 09/12/2024 EXAM: CTA CHEST PE  WITH CONTRAST CT ABDOMEN AND PELVIS  WITH CONTRAST 09/12/2024 06:06:11 PM TECHNIQUE: CTA of the chest was performed after the administration of intravenous contrast. Multiplanar reformatted images are provided for review. MIP images are provided for review. CT of the abdomen and pelvis was performed with the administration of intravenous contrast. Automated exposure control, iterative reconstruction, and/or weight based adjustment of the mA/kV was utilized to reduce the  radiation dose to as low as reasonably achievable. COMPARISON: CT 06/11/2024. CLINICAL HISTORY: Pulmonary embolism (PE) suspected, high probability. Confusion, fever (103F), and decreased responsiveness. History of renal cell carcinoma with brain metastasis, on immunologic/chemotherapy meds (currently on hold). Denies seizures, headache, neck pain, nausea, vomiting, or diarrhea. Poor oral intake.  Palliative care evaluation pending. FINDINGS: CHEST: PULMONARY ARTERIES: Pulmonary arteries are adequately opacified for evaluation. No intraluminal filling defect to suggest pulmonary embolism. Main pulmonary artery is normal in caliber. MEDIASTINUM: No mediastinal lymphadenopathy. The heart and pericardium demonstrate no acute abnormality. There is no acute abnormality of the thoracic aorta. LUNGS AND PLEURA: Patchy bilateral ground-glass opacities in a peribronchovascular distribution with more confluent airspace opacities in the left greater than right lower lobes. Numerous pulmonary cysts. No pleural effusion or pneumothorax. Unchanged 6 mm nodule on the left lower lobe on series 9 image 243. SOFT TISSUES AND BONES: No acute bone or soft tissue abnormality. ABDOMEN AND PELVIS: LIVER: Hepatic steatosis. GALLBLADDER AND BILE DUCTS: Gallbladder is unremarkable. No biliary ductal dilatation. SPLEEN: Spleen demonstrates no acute abnormality. PANCREAS: Cystic lesion in the tail of the pancreas is unchanged again measuring 1.3 cm. No pancreatic ductal dilation or evidence of acute pancreatitis. ADRENAL GLANDS: Right adrenalectomy. Left adrenal cysts. KIDNEYS, URETERS AND BLADDER: Right nephrectomy. No stones in the left kidney or ureters. No hydronephrosis. No perinephric or periureteral stranding. Urinary bladder is unremarkable. GI AND BOWEL: Stomach and duodenal sweep demonstrate no acute abnormality. There is no bowel obstruction. No abnormal bowel wall thickening or distension. Normal appendix. REPRODUCTIVE: Metallic seeds in the prostate. Enlarged prostate. PERITONEUM AND RETROPERITONEUM: No ascites or free air. LYMPH NODES: No lymphadenopathy. BONES AND SOFT TISSUES: No acute abnormality of the visualized bones. Similar mildly enhancing soft tissue nodule in the right nephrectomy bed overlying the most superior portion of the right psoas measuring 2.0 x 0.7 cm. IMPRESSION: 1. No evidence of pulmonary embolism.  2. Patchy bilateral ground-glass and confluent lower lobe airspace opacities. Differential considerations include multifocal pneumonia, edema, alveolar hemorrhage, or acute lung injury. 3. No acute abdominal or pelvic abnormality. Electronically signed by: Norman Gatlin MD 09/12/2024 06:55 PM EDT RP Workstation: [id number]   CT ABDOMEN PELVIS W CONTRAST Result Date: 09/12/2024 EXAM: CTA CHEST PE  WITH CONTRAST CT ABDOMEN AND PELVIS  WITH CONTRAST 09/12/2024 06:06:11 PM TECHNIQUE: CTA of the chest was performed after the administration of intravenous contrast. Multiplanar reformatted images are provided for review. MIP images are provided for review. CT of the abdomen and pelvis was performed with the administration of intravenous contrast. Automated exposure control, iterative reconstruction, and/or weight based adjustment of the mA/kV was utilized to reduce the radiation dose to as low as reasonably achievable. COMPARISON: CT 06/11/2024. CLINICAL HISTORY: Pulmonary embolism (PE) suspected, high probability. Confusion, fever (103F), and decreased responsiveness. History of renal cell carcinoma with brain metastasis, on immunologic/chemotherapy meds (currently on hold). Denies seizures, headache, neck pain, nausea, vomiting, or diarrhea. Poor oral intake. Palliative care evaluation pending. FINDINGS: CHEST: PULMONARY ARTERIES: Pulmonary arteries are adequately opacified for evaluation. No intraluminal filling defect to suggest pulmonary embolism. Main pulmonary artery is normal in caliber. MEDIASTINUM: No mediastinal lymphadenopathy. The heart and pericardium demonstrate no acute abnormality. There is no acute abnormality of the thoracic aorta. LUNGS AND PLEURA: Patchy bilateral ground-glass opacities in a peribronchovascular distribution with more confluent airspace opacities in the left greater than right lower lobes. Numerous pulmonary cysts. No pleural effusion or pneumothorax. Unchanged 6 mm nodule on the  left lower lobe on series  9 image 243. SOFT TISSUES AND BONES: No acute bone or soft tissue abnormality. ABDOMEN AND PELVIS: LIVER: Hepatic steatosis. GALLBLADDER AND BILE DUCTS: Gallbladder is unremarkable. No biliary ductal dilatation. SPLEEN: Spleen demonstrates no acute abnormality. PANCREAS: Cystic lesion in the tail of the pancreas is unchanged again measuring 1.3 cm. No pancreatic ductal dilation or evidence of acute pancreatitis. ADRENAL GLANDS: Right adrenalectomy. Left adrenal cysts. KIDNEYS, URETERS AND BLADDER: Right nephrectomy. No stones in the left kidney or ureters. No hydronephrosis. No perinephric or periureteral stranding. Urinary bladder is unremarkable. GI AND BOWEL: Stomach and duodenal sweep demonstrate no acute abnormality. There is no bowel obstruction. No abnormal bowel wall thickening or distension. Normal appendix. REPRODUCTIVE: Metallic seeds in the prostate. Enlarged prostate. PERITONEUM AND RETROPERITONEUM: No ascites or free air. LYMPH NODES: No lymphadenopathy. BONES AND SOFT TISSUES: No acute abnormality of the visualized bones. Similar mildly enhancing soft tissue nodule in the right nephrectomy bed overlying the most superior portion of the right psoas measuring 2.0 x 0.7 cm. IMPRESSION: 1. No evidence of pulmonary embolism. 2. Patchy bilateral ground-glass and confluent lower lobe airspace opacities. Differential considerations include multifocal pneumonia, edema, alveolar hemorrhage, or acute lung injury. 3. No acute abdominal or pelvic abnormality. Electronically signed by: Norman Gatlin MD 09/12/2024 06:55 PM EDT RP Workstation: [id number]   CT Head Wo Contrast Result Date: 09/12/2024 CLINICAL DATA:  Worsening headache. Fever. Undergoing chemotherapy for renal cell carcinoma. EXAM: CT HEAD WITHOUT CONTRAST TECHNIQUE: Contiguous axial images were obtained from the base of the skull through the vertex without intravenous contrast. RADIATION DOSE REDUCTION: This exam was  performed according to the departmental dose-optimization program which includes automated exposure control, adjustment of the mA and/or kV according to patient size and/or use of iterative reconstruction technique. COMPARISON:  09/01/2024 FINDINGS: Brain: No evidence of intracranial hemorrhage, acute infarction, hydrocephalus, extra-axial collection, or mass lesion/mass effect. Stable mild cerebral atrophy and moderate chronic small vessel disease. 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 Several small cystic lesions in the medial left frontal and right parietal lobes remains stable. Vascular:  No hyperdense vessel or other acute findings. Skull: No evidence of fracture or other significant bone abnormality. Sinuses/Orbits:  No acute findings. Other: None. IMPRESSION: No acute intracranial abnormality. Stable cerebral atrophy and chronic small vessel disease. a Stable small cystic lesions in the medial left frontal and right parietal lobes. Electronically Signed   By: Norleen DELENA Kil M.D.   On: 09/12/2024 18:27   DG Chest Portable 1 View Result Date: 09/12/2024 CLINICAL DATA:  fever EXAM: PORTABLE CHEST - 1 VIEW COMPARISON:  09/01/2024 FINDINGS: Mild elevation of the right hemidiaphragm due to low lung volumes. No focal airspace consolidation, pleural effusion, or pneumothorax. The cardiac silhouette is at the upper limits of normal, likely accentuated by AP technique and low lung volumes. Sternotomy wires. Tortuous aorta with aortic atherosclerosis. No acute fracture or destructive lesions. Multilevel thoracic osteophytosis. IMPRESSION: Low lung volumes.  Otherwise, no acute cardiopulmonary abnormality. Electronically Signed   By: Rogelia Myers M.D.   On: 09/12/2024 15:04   DG Chest Portable 1 View Result Date: 09/01/2024 CLINICAL DATA:  Fatigue EXAM: PORTABLE CHEST 1 VIEW COMPARISON:  December 13, 2023 FINDINGS: Previous median sternotomy. The heart size is normal. There is no pleural effusion.  The lungs are clear. No fracture or bone lesion identified. IMPRESSION: No acute disease Electronically Signed   By: Nancyann Burns M.D.   On: 09/01/2024 09:57   CT Head Wo Contrast Result Date: 09/01/2024 CLINICAL  DATA:  Headache and fatigue EXAM: CT HEAD WITHOUT CONTRAST TECHNIQUE: Contiguous axial images were obtained from the base of the skull through the vertex without intravenous contrast. RADIATION DOSE REDUCTION: This exam was performed according to the departmental dose-optimization program which includes automated exposure control, adjustment of the mA and/or kV according to patient size and/or use of iterative reconstruction technique. COMPARISON:  Brain MRI August 07, 2004 FINDINGS: CT HEAD: Small cystic lesions are noted in the medial left frontal lobe and right parietal lobe similar to the previous brain MRI. There is no hemorrhage. No acute ischemic changes. No new mass lesion identified. The ventricles are normal. Skull/sinuses/orbits: No significant abnormality. IMPRESSION: Small cystic lesions are noted in the medial left frontal lobe and right parietal lobe similar to the brain MRI from August 07, 2024. There is no hemorrhage, acute infarct or new mass identified. Electronically Signed   By: Nancyann Burns M.D.   On: 09/01/2024 09:55    Microbiology: Results for orders placed or performed during the hospital encounter of 09/12/24  Resp panel by RT-PCR (RSV, Flu A&B, Covid) Anterior Nasal Swab     Status: None   Collection Time: 09/12/24  1:55 PM   Specimen: Anterior Nasal Swab  Result Value Ref Range Status   SARS Coronavirus 2 by RT PCR NEGATIVE NEGATIVE Final    Comment: (NOTE) SARS-CoV-2 target nucleic acids are NOT DETECTED.  The SARS-CoV-2 RNA is generally detectable in upper respiratory specimens during the acute phase of infection. The lowest concentration of SARS-CoV-2 viral copies this assay can detect is 138 copies/mL. A negative result does not preclude  SARS-Cov-2 infection and should not be used as the sole basis for treatment or other patient management decisions. A negative result may occur with  improper specimen collection/handling, submission of specimen other than nasopharyngeal swab, presence of viral mutation(s) within the areas targeted by this assay, and inadequate number of viral copies(<138 copies/mL). A negative result must be combined with clinical observations, patient history, and epidemiological information. The expected result is Negative.  Fact Sheet for Patients:  BloggerCourse.com  Fact Sheet for Healthcare Providers:  SeriousBroker.it  This test is no t yet approved or cleared by the [place]  FDA and  has been authorized for detection and/or diagnosis of SARS-CoV-2 by FDA under an Emergency Use Authorization (EUA). This EUA will remain  in effect (meaning this test can be used) for the duration of the COVID-19 declaration under Section 564(b)(1) of the Act, 21 U.S.C.section 360bbb-3(b)(1), unless the authorization is terminated  or revoked sooner.       Influenza A by PCR NEGATIVE NEGATIVE Final   Influenza B by PCR NEGATIVE NEGATIVE Final    Comment: (NOTE) The Xpert Xpress SARS-CoV-2/FLU/RSV plus assay is intended as an aid in the diagnosis of influenza from Nasopharyngeal swab specimens and should not be used as a sole basis for treatment. Nasal washings and aspirates are unacceptable for Xpert Xpress SARS-CoV-2/FLU/RSV testing.  Fact Sheet for Patients: BloggerCourse.com  Fact Sheet for Healthcare Providers: SeriousBroker.it  This test is not yet approved or cleared by the [place]  FDA and has been authorized for detection and/or diagnosis of SARS-CoV-2 by FDA under an Emergency Use Authorization (EUA). This EUA will remain in effect (meaning this test can be used) for the duration of  the COVID-19 declaration under Section 564(b)(1) of the Act, 21 U.S.C. section 360bbb-3(b)(1), unless the authorization is terminated or revoked.     Resp Syncytial Virus by PCR NEGATIVE NEGATIVE Final  Comment: (NOTE) Fact Sheet for Patients: BloggerCourse.com  Fact Sheet for Healthcare Providers: SeriousBroker.it  This test is not yet approved or cleared by the [place]  FDA and has been authorized for detection and/or diagnosis of SARS-CoV-2 by FDA under an Emergency Use Authorization (EUA). This EUA will remain in effect (meaning this test can be used) for the duration of the COVID-19 declaration under Section 564(b)(1) of the Act, 21 U.S.C. section 360bbb-3(b)(1), unless the authorization is terminated or revoked.  Performed at [facility], [address] [place], [place] [address]   Blood Culture (routine x 2)     Status: None   Collection Time: 09/12/24  1:55 PM   Specimen: BLOOD LEFT FOREARM  Result Value Ref Range Status   Specimen Description BLOOD LEFT FOREARM  Final   Special Requests   Final    BOTTLES DRAWN AEROBIC AND ANAEROBIC Blood Culture adequate volume   Culture   Final    NO GROWTH 5 DAYS Performed at [facility], [address] [place], [place] [address]    Report Status 09/17/2024 FINAL  Final  Blood Culture (routine x 2)     Status: None   Collection Time: 09/12/24  3:19 PM   Specimen: BLOOD  Result Value Ref Range Status   Specimen Description   Final    BLOOD LEFT ANTECUBITAL Performed at [facility], [address] [place], [place] [address]    Special Requests   Final    BOTTLES DRAWN AEROBIC AND ANAEROBIC Blood Culture adequate volume Performed at [facility], [address] [place], [place] [address]    Culture   Final    NO GROWTH 5 DAYS Performed at [facility], [address], [place], [place]  [address]    Report Status 09/17/2024 FINAL  Final  Urine Culture (for pregnant, neutropenic or urologic patients or patients with an indwelling urinary catheter)     Status: None   Collection Time: 09/12/24  4:06 PM   Specimen: Urine, Clean Catch  Result Value Ref Range Status   Specimen Description   Final    URINE, CLEAN CATCH Performed at [facility], [address], [place], [place] [address]    Special Requests   Final    NONE Performed at [facility], [address] [place], [place] [address]    Culture   Final    NO GROWTH Performed at [facility], [address], [place], [place] [address]    Report Status 09/13/2024 FINAL  Final  Respiratory (~20 pathogens) panel by PCR     Status: None   Collection Time: 09/13/24 12:42 PM   Specimen: Nasopharyngeal Swab; Respiratory  Result Value Ref Range Status   Adenovirus NOT DETECTED NOT DETECTED Final   Coronavirus 229E NOT DETECTED NOT DETECTED Final    Comment: (NOTE) The Coronavirus on the Respiratory Panel, DOES NOT test for the novel  Coronavirus (2019 nCoV)    Coronavirus HKU1 NOT DETECTED NOT DETECTED Final   Coronavirus NL63 NOT DETECTED NOT DETECTED Final   Coronavirus OC43 NOT DETECTED NOT DETECTED Final   Metapneumovirus NOT DETECTED NOT DETECTED Final   Rhinovirus / Enterovirus NOT DETECTED NOT DETECTED Final   Influenza A NOT DETECTED NOT DETECTED Final   Influenza B NOT DETECTED NOT DETECTED Final   Parainfluenza Virus 1 NOT DETECTED NOT DETECTED Final   Parainfluenza Virus 2 NOT DETECTED NOT DETECTED Final  Parainfluenza Virus 3 NOT DETECTED NOT DETECTED Final   Parainfluenza Virus 4 NOT DETECTED NOT DETECTED Final   Respiratory Syncytial Virus NOT DETECTED NOT DETECTED Final   Bordetella pertussis NOT DETECTED NOT DETECTED Final   Bordetella Parapertussis NOT DETECTED NOT DETECTED Final   Chlamydophila pneumoniae NOT DETECTED NOT DETECTED Final   Mycoplasma  pneumoniae NOT DETECTED NOT DETECTED Final    Comment: Performed at [facility], [address], [place], [place] [address]  Culture, blood (Routine X 2) w Reflex to ID Panel     Status: None (Preliminary result)   Collection Time: 09/14/24 10:27 AM   Specimen: BLOOD RIGHT ARM  Result Value Ref Range Status   Specimen Description   Final    BLOOD RIGHT ARM Performed at [facility], [address] [place], [place] [address]    Special Requests   Final    BOTTLES DRAWN AEROBIC AND ANAEROBIC Blood Culture results may not be optimal due to an inadequate volume of blood received in culture bottles Performed at [facility], [address], [place], [place] [address]    Culture   Final    NO GROWTH 4 DAYS Performed at [facility], [address] [place], [place] [address]    Report Status PENDING  Incomplete  Culture, blood (Routine X 2) w Reflex to ID Panel     Status: None (Preliminary result)   Collection Time: 09/14/24 10:33 AM   Specimen: BLOOD LEFT ARM  Result Value Ref Range Status   Specimen Description   Final    BLOOD LEFT ARM Performed at [facility], [address] [place], [place] [address]    Special Requests   Final    BOTTLES DRAWN AEROBIC ONLY Blood Culture results may not be optimal due to an inadequate volume of blood received in culture bottles Performed at [facility], [address], [place], [place] [address]    Culture   Final    NO GROWTH 4 DAYS Performed at [facility], [address] [place], [place] [address]    Report Status PENDING  Incomplete  MRSA Next Gen by PCR, Nasal     Status: None   Collection Time: 09/14/24  2:37 PM   Specimen: Nasal Mucosa; Nasal Swab  Result Value Ref Range Status   MRSA by PCR Next Gen NOT DETECTED NOT DETECTED Final    Comment: (NOTE) The GeneXpert MRSA Assay (FDA approved for NASAL specimens only), is one component of a comprehensive MRSA  colonization surveillance program. It is not intended to diagnose MRSA infection nor to guide or monitor treatment for MRSA infections. Test performance is not FDA approved in patients less than 76 years old. Performed at [facility], [address], [place], [place] [address]    Labs: CBC: Recent Labs  Lab 09/12/24 1558 09/13/24 0056 09/14/24 0451 09/16/24 0825 09/17/24 0601  WBC 5.1 5.7 6.8 5.2 7.6  NEUTROABS 3.5  --  5.1  --   --   HGB 10.9* 11.0* 11.8* 10.2* 9.1*  HCT 35.3* 36.0* 37.4* 32.0* 29.3*  MCV 81.9 83.1 81.0 82.1 82.3  PLT 131* 176 199 263 278   Basic Metabolic Panel: Recent Labs  Lab 09/12/24 1502 09/13/24 0056 09/14/24 0648 09/16/24 0825 09/17/24 0601  NA 132* 133* 132* 134* 136  K 3.6 3.7 3.7 3.8 3.8  CL 95* 96* 94* 99 103  CO2 22 24 22  19* 18*  GLUCOSE 153* 135* 109* 116* 165*  BUN 25* 22 15 21  28*  CREATININE 1.51* 1.48* 1.27* 1.23 1.16  CALCIUM  9.2 9.1 8.9 8.8* 8.5*  MG  --  2.0 1.7 1.9 2.0  PHOS  --   --   --   --  2.1*   Liver Function Tests: Recent Labs  Lab 09/12/24 1502 09/14/24 0648 09/17/24 0601  AST 74* 89*  --   ALT 54* 44  --   ALKPHOS 106 108  --   BILITOT 0.5 0.5  --   PROT 6.7 6.1*  --   ALBUMIN 3.3* 3.0* 2.6*   CBG: Recent Labs  Lab 09/15/24 0405  GLUCAP 115*    Discharge time spent: greater than 30 minutes.  Author: Yetta Blanch, MD  [facility]

## 2024-09-18 NOTE — Progress Notes (Signed)
 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   Clinically without improvement from antibiotics, extensive workup negative for infections. Trial of steroid did not help. MRI showed intracranial hemorrhage from left frontal lobe lesion.  Patient has been transition to comfort measures.  Wife at bedside.  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   Appreciate internal medicine, palliative care and hospice consult.   Thank you for the consult.  Please call if any question.  Garrett [name], MD 09/18/2024 4:05 PM  Subjective:  Wife is in the room.  He is now comfort measures.  Objective:  Vitals:   09/16/24 2341 09/17/24 0516  BP:  (!) 151/84  Pulse:  82  Resp: 19 17  Temp:  97.8 F (36.6 C)  SpO2:  94%     Intake/Output Summary (Last 24 hours) at 09/18/2024 1605 Last data filed at 09/18/2024 0100 Gross per 24 hour  Intake --  Output 450 ml  Net -450 ml    GENERAL: Resting comfortably, no distress

## 2024-09-18 NOTE — Assessment & Plan Note (Signed)
 Hospice care.   Appreciate internal medicine, palliative care and hospice consult.

## 2024-09-18 NOTE — TOC Transition Note (Signed)
 Transition of Care [facility]) - Discharge Note   Patient Details  Name: [name] MRN: [id number] Date of Birth: [birth year]  Transition of Care [facility]) CM/SW Contact:  Sonda Manuella Quill, RN Phone Number: 09/18/2024, 5:57 PM   Clinical Narrative:    D/C orders received; notified by Eleanor Nail, [facility] Liaison, pt has received bed at [facility]; spoke w/ pt's wife Almarie at bedside; she agreed to d/c plan; notified consents have been signed; spoke w/ Hadassah at facility; she gave RM # 5, call report [phone]; she said pt cannot admit to after 830 pm; Ernie RN notified that oncoming RN will have to call [facility] for transport [phone]);  d/c packet at desk; no IP CM needs.  Final next level of care: Hospice Medical Facility Barriers to Discharge: No Barriers Identified   Patient Goals and CMS Choice Patient states their goals for this hospitalization and ongoing recovery are:: Home CMS Medicare.gov Compare Post Acute Care list provided to:: Patient Represenative (must comment) Meg Jansky) Choice offered to / list presented to : Spouse [place] ownership interest in [facility].provided to:: Parent NA    Discharge Placement                Patient to be transferred to facility by: [facility] Name of family member notified: Almarie Masson (spouse) Patient and family notified of of transfer: 09/18/24  Discharge Plan and Services Additional resources added to the After Visit Summary for   In-house Referral: Hospice / Palliative Care Discharge Planning Services: CM Consult Post Acute Care Choice: NA          DME Arranged: N/A DME Agency: NA       HH Arranged: NA HH Agency: NA        Social Drivers of Health (SDOH) Interventions SDOH Screenings   Food Insecurity: No Food Insecurity (09/12/2024)  Housing: Low Risk  (09/12/2024)  Transportation Needs: No Transportation Needs (09/12/2024)  Utilities: Not At Risk (09/12/2024)   Depression (PHQ2-9): Low Risk  (08/14/2024)  Social Connections: Socially Integrated (09/12/2024)  Tobacco Use: High Risk (09/13/2024)     Readmission Risk Interventions    09/15/2024    2:49 PM  Readmission Risk Prevention Plan  Transportation Screening Complete  PCP or Specialist Appt within 5-7 Days Complete  Home Care Screening Complete  Medication Review (RN CM) Complete

## 2024-09-18 NOTE — Plan of Care (Signed)

## 2024-09-19 LAB — CULTURE, BLOOD (ROUTINE X 2)
Culture: NO GROWTH
Culture: NO GROWTH

## 2024-09-19 NOTE — Progress Notes (Signed)
 Patient is transferred to [facility]. Dilaudid  IV bag was wasted with 2nd RN Vera. Wasted 49ml left in bag with 2nd RN witness.

## 2024-09-19 NOTE — Plan of Care (Signed)
  Problem: Education: Goal: Knowledge of General Education information will improve Description: Including pain rating scale, medication(s)/side effects and non-pharmacologic comfort measures Outcome: Progressing   Problem: Clinical Measurements: Goal: Ability to maintain clinical measurements within normal limits will improve Outcome: Progressing   Problem: Respiratory: Goal: Ability to maintain adequate ventilation will improve Outcome: Progressing Goal: Ability to maintain a clear airway will improve Outcome: Progressing

## 2024-09-22 ENCOUNTER — Other Ambulatory Visit: Payer: Self-pay

## 2024-09-22 NOTE — Progress Notes (Signed)
 Treatment discontinued due to patient transitioned to hospice.

## 2024-09-22 NOTE — Progress Notes (Signed)
 Therapy discontinued as patient transitioned to comfort care per hospital discharge notes, disenrolled.

## 2024-09-25 ENCOUNTER — Inpatient Hospital Stay: Admitting: Licensed Clinical Social Worker

## 2024-09-25 ENCOUNTER — Inpatient Hospital Stay

## 2024-09-27 DEATH — deceased

## 2024-10-02 ENCOUNTER — Ambulatory Visit (HOSPITAL_COMMUNITY): Admission: RE | Admit: 2024-10-02 | Source: Ambulatory Visit

## 2024-10-03 ENCOUNTER — Ambulatory Visit (HOSPITAL_COMMUNITY)

## 2024-10-08 NOTE — Progress Notes (Signed)
 Ok

## 2024-10-09 ENCOUNTER — Inpatient Hospital Stay
